# Patient Record
Sex: Female | Born: 1935 | Race: White | Hispanic: No | Smoking: Former smoker
Health system: Southern US, Community
[De-identification: ages and names within clinical notes are randomized; demographics above are authoritative.]

## PROBLEM LIST (undated history)

## (undated) DIAGNOSIS — I10 Essential (primary) hypertension: Secondary | ICD-10-CM

## (undated) DIAGNOSIS — Z860100 Personal history of colon polyps, unspecified: Secondary | ICD-10-CM

## (undated) DIAGNOSIS — C50919 Malignant neoplasm of unspecified site of unspecified female breast: Secondary | ICD-10-CM

## (undated) DIAGNOSIS — Z923 Personal history of irradiation: Secondary | ICD-10-CM

## (undated) DIAGNOSIS — I4891 Unspecified atrial fibrillation: Secondary | ICD-10-CM

## (undated) DIAGNOSIS — E7800 Pure hypercholesterolemia, unspecified: Secondary | ICD-10-CM

## (undated) DIAGNOSIS — R079 Chest pain, unspecified: Secondary | ICD-10-CM

## (undated) DIAGNOSIS — C801 Malignant (primary) neoplasm, unspecified: Secondary | ICD-10-CM

## (undated) HISTORY — PX: CATARACT EXTRACTION: SUR2

## (undated) HISTORY — PX: DILATION AND CURETTAGE OF UTERUS: SHX78

## (undated) HISTORY — DX: Personal history of colonic polyps: Z86.010

## (undated) HISTORY — DX: Pure hypercholesterolemia, unspecified: E78.00

## (undated) HISTORY — DX: Personal history of colon polyps, unspecified: Z86.0100

## (undated) HISTORY — DX: Essential (primary) hypertension: I10

---
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
## 1998-03-16 HISTORY — PX: LAMINECTOMY: SHX219

## 2001-03-16 HISTORY — PX: OTHER SURGICAL HISTORY: SHX169

## 2004-02-18 ENCOUNTER — Ambulatory Visit: Payer: Self-pay | Admitting: Internal Medicine

## 2004-05-28 ENCOUNTER — Ambulatory Visit: Payer: Self-pay | Admitting: Unknown Physician Specialty

## 2004-09-05 ENCOUNTER — Ambulatory Visit: Payer: Self-pay | Admitting: Internal Medicine

## 2004-09-11 ENCOUNTER — Ambulatory Visit: Payer: Self-pay | Admitting: Internal Medicine

## 2004-10-27 ENCOUNTER — Ambulatory Visit: Payer: Self-pay | Admitting: Internal Medicine

## 2005-02-18 ENCOUNTER — Ambulatory Visit: Payer: Self-pay | Admitting: Internal Medicine

## 2006-02-23 ENCOUNTER — Ambulatory Visit: Payer: Self-pay | Admitting: Internal Medicine

## 2006-05-07 ENCOUNTER — Other Ambulatory Visit: Payer: Self-pay

## 2006-05-07 ENCOUNTER — Inpatient Hospital Stay: Payer: Self-pay | Admitting: Internal Medicine

## 2007-03-31 ENCOUNTER — Ambulatory Visit: Payer: Self-pay | Admitting: Internal Medicine

## 2008-04-02 ENCOUNTER — Ambulatory Visit: Payer: Self-pay | Admitting: Internal Medicine

## 2009-04-03 ENCOUNTER — Ambulatory Visit: Payer: Self-pay | Admitting: Internal Medicine

## 2010-04-07 ENCOUNTER — Ambulatory Visit: Payer: Self-pay | Admitting: Internal Medicine

## 2010-05-01 ENCOUNTER — Ambulatory Visit: Payer: Self-pay | Admitting: Unknown Physician Specialty

## 2011-02-11 ENCOUNTER — Ambulatory Visit: Payer: Self-pay | Admitting: Ophthalmology

## 2011-02-11 DIAGNOSIS — I10 Essential (primary) hypertension: Secondary | ICD-10-CM

## 2011-02-23 ENCOUNTER — Ambulatory Visit: Payer: Self-pay | Admitting: Ophthalmology

## 2011-04-13 ENCOUNTER — Ambulatory Visit: Payer: Self-pay | Admitting: Internal Medicine

## 2011-12-21 ENCOUNTER — Ambulatory Visit: Payer: Self-pay | Admitting: Ophthalmology

## 2011-12-21 DIAGNOSIS — I10 Essential (primary) hypertension: Secondary | ICD-10-CM

## 2011-12-21 LAB — POTASSIUM: Potassium: 3.5 mmol/L (ref 3.5–5.1)

## 2011-12-22 ENCOUNTER — Telehealth: Payer: Self-pay | Admitting: Internal Medicine

## 2011-12-22 NOTE — Telephone Encounter (Signed)
Pt is having sharpe at times and ongoing dull pains in lower right abdomen. It is not her appendix she has already had that removed. She is just concerned about this and wanted to come in sooner than January.   Cell Phone [phone]

## 2011-12-22 NOTE — Telephone Encounter (Signed)
Ok to schedule on 12/28/11 at 9:30.

## 2011-12-22 NOTE — Telephone Encounter (Signed)
Appointment 10/14 pt aware

## 2011-12-22 NOTE — Telephone Encounter (Signed)
Confirm no acute problems currently.  If no acute problems then I can work her in 11:30 on Thursday.  Explain to her she may have to wait.  Once confirm no acute problems, can forward to Robin to schedule.  Thanks.

## 2011-12-22 NOTE — Telephone Encounter (Signed)
Patient advised via telephone, she is having no acute issues.  Offered her appt with Dr. Lorin Picket on Thursday and she declined, she stated that she can wait until next week.

## 2011-12-25 ENCOUNTER — Ambulatory Visit: Payer: Self-pay | Admitting: Ophthalmology

## 2011-12-28 ENCOUNTER — Ambulatory Visit (INDEPENDENT_AMBULATORY_CARE_PROVIDER_SITE_OTHER): Payer: Medicare Other | Admitting: Internal Medicine

## 2011-12-28 ENCOUNTER — Encounter: Payer: Self-pay | Admitting: Internal Medicine

## 2011-12-28 VITALS — BP 132/70 | HR 73 | Temp 98.1°F | Ht 60.5 in | Wt 149.0 lb

## 2011-12-28 DIAGNOSIS — E782 Mixed hyperlipidemia: Secondary | ICD-10-CM | POA: Insufficient documentation

## 2011-12-28 DIAGNOSIS — R102 Pelvic and perineal pain unspecified side: Secondary | ICD-10-CM

## 2011-12-28 DIAGNOSIS — E785 Hyperlipidemia, unspecified: Secondary | ICD-10-CM | POA: Insufficient documentation

## 2011-12-28 DIAGNOSIS — I10 Essential (primary) hypertension: Secondary | ICD-10-CM

## 2011-12-28 DIAGNOSIS — E7800 Pure hypercholesterolemia, unspecified: Secondary | ICD-10-CM

## 2011-12-28 DIAGNOSIS — R109 Unspecified abdominal pain: Secondary | ICD-10-CM

## 2011-12-28 DIAGNOSIS — N949 Unspecified condition associated with female genital organs and menstrual cycle: Secondary | ICD-10-CM

## 2011-12-28 LAB — CBC WITH DIFFERENTIAL/PLATELET
Eosinophils Absolute: 0.1 10*3/uL (ref 0.0–0.7)
Eosinophils Relative: 1.6 % (ref 0.0–5.0)
Lymphocytes Relative: 21.3 % (ref 12.0–46.0)
MCV: 95.2 fl (ref 78.0–100.0)
Monocytes Absolute: 0.3 10*3/uL (ref 0.1–1.0)
Neutrophils Relative %: 69.3 % (ref 43.0–77.0)
Platelets: 226 10*3/uL (ref 150.0–400.0)
WBC: 4.4 10*3/uL — ABNORMAL LOW (ref 4.5–10.5)

## 2011-12-28 LAB — LIPID PANEL
HDL: 76.1 mg/dL (ref >39.00)
LDL Cholesterol: 88 mg/dL (ref 0–99)
Total CHOL/HDL Ratio: 2
VLDL: 18.6 mg/dL (ref 0.0–40.0)

## 2011-12-28 LAB — URINALYSIS, ROUTINE W REFLEX MICROSCOPIC
Bilirubin Urine: NEGATIVE
Ketones, ur: NEGATIVE
Leukocytes, UA: NEGATIVE
Nitrite: NEGATIVE

## 2011-12-28 LAB — HEPATIC FUNCTION PANEL
Alkaline Phosphatase: 74 U/L (ref 39–117)
Bilirubin, Direct: 0.1 mg/dL (ref 0.0–0.3)

## 2011-12-28 NOTE — Patient Instructions (Addendum)
It was nice seeing you today.  I am sorry you have been having some problems lately.  I am going to check a few things in the blood and obtain a urine sample today.  I am also going to schedule a pelvic ultrasound.   We will notify you of the results of the labs and ultrasound once they become available.

## 2011-12-28 NOTE — Progress Notes (Signed)
  Subjective:    Patient ID: Erin [name], female    DOB: 1930-07-06, 78 y.o.   MRN: [id number]  HPI 78 year old female with past history of hypertension and hypercholesterolemia who comes in today as a work in with concerns regarding persistent/intermittant right lower quadrant pain.  Pain started in 6/13.  Has worsened over the last two months.  Has become more frequent and more intense.  Described as a dull ache and then will change to a sharp pain.  Flares intermittently.  No known triggers.  No change in bowels.  No urinary change.  No problems walking.  Pain does not radiate.    Past Medical History  Diagnosis Date  . Hypertension   . Hypercholesterolemia   . Melanoma     Review of Systems Patient denies any headache, lightheadedness or dizziness.  No chest pain, tightness or palpatations. No increased shortness of breath, cough or congestion.  No acid reflux, dysphagia or odynophagia. No nausea or vomiting.  Pain localized to the RLQ.  No radiation.  No bowel change, such as diarrhea, constipation, BRBPR or melana.  No urine change.   No history of kidney stones.  No back pain.  No known injury, trauma or strain.       Objective:   Physical Exam Filed Vitals:   12/28/11 0915  BP: 132/70  Pulse: 73  Temp: 98.1 F (45.60 C)   78 year old female in no acute distress.  NECK:  Supple.  Nontender. HEART:  Appears to be regular. LUNGS:  No crackles or wheezing audible.  Respirations even and unlabored.  RADIAL PULSE:  Equal bilaterally. ABDOMEN:  Soft.  Some tenderness to palpation over the RLQ.  No rebound or guarding.  Bowel sounds present and normal.  No audible abdominal bruit.  GU:  Normal external genitalia.  Vaginal vault without lesions.  Could not appreciate any adnexal masses (some minimal tenderness- right -  on bimanual exam).    RECTAL:  Heme negative.   EXTREMITIES:  No increased edema.            Assessment & Plan:  RIGHT LOWER QUADRANT PAIN.  Persistent intermittent  pain over the last two months.  No bowel changes associated with the pain.  Pain has increased in frequency and intensity lately.  Exam as outlined.  Check cbc, metabolic panel and urinalysis.  Will also schedule a pelvic ultrasound.  Follow closely.  If above unrevealing of an etiology, will consider CT scan.   She reports she had a recent colonoscopy.  If any change or worsening problems, she is to be reevaluated.   OPTHALMOLOGY.  S/P cataract surgery 12/25/11.  Doing well.  Continue to follow up with Opthalmology.   HEALTH MAINTENANCE.  Will obtain outside records to review - to keep her on track with her physicals, etc.

## 2011-12-28 NOTE — Assessment & Plan Note (Signed)
Blood pressure has been under good control.  Check met b today.  Continue same meds.

## 2011-12-28 NOTE — Assessment & Plan Note (Addendum)
Low cholesterol diet.  Continue Lipitor.  Check lipid panel and liver function today.

## 2011-12-29 ENCOUNTER — Encounter: Payer: Self-pay | Admitting: *Deleted

## 2011-12-29 LAB — BASIC METABOLIC PANEL WITH GFR
Chloride: 103 mEq/L (ref 96–112)
GFR, Est African American: 76 mL/min
GFR, Est Non African American: 66 mL/min
Glucose, Bld: 94 mg/dL (ref 70–99)
Potassium: 4.4 mEq/L (ref 3.5–5.3)
Sodium: 141 mEq/L (ref 135–145)

## 2012-01-04 ENCOUNTER — Encounter: Payer: Self-pay | Admitting: Internal Medicine

## 2012-01-11 ENCOUNTER — Ambulatory Visit: Payer: Self-pay | Admitting: Internal Medicine

## 2012-01-13 ENCOUNTER — Telehealth: Payer: Self-pay | Admitting: Internal Medicine

## 2012-01-13 NOTE — Telephone Encounter (Signed)
Please notify pt that her pelvic ultrasound is negative.  Need to know if symptoms have resolved - how is she doing?

## 2012-01-15 NOTE — Telephone Encounter (Signed)
Patient states that she is feeling better and that she did not want to do anything else about it at this time. Patient also stated that she will let you know if she has any more problems.

## 2012-01-21 ENCOUNTER — Encounter: Payer: Self-pay | Admitting: Internal Medicine

## 2012-03-13 ENCOUNTER — Telehealth: Payer: Self-pay | Admitting: Internal Medicine

## 2012-03-13 NOTE — Telephone Encounter (Signed)
Pt needs to be scheduled for a physical.  Her last was 02/13/11.  (first available physical spot).  Thanks.

## 2012-03-14 ENCOUNTER — Telehealth: Payer: Self-pay | Admitting: Internal Medicine

## 2012-03-14 DIAGNOSIS — Z139 Encounter for screening, unspecified: Secondary | ICD-10-CM

## 2012-03-14 NOTE — Telephone Encounter (Signed)
Notify pt I placed order for mammo.  She should be receiving a call about scheduling.

## 2012-03-14 NOTE — Telephone Encounter (Signed)
Order placed for mammo.

## 2012-03-14 NOTE — Telephone Encounter (Signed)
scheduled

## 2012-03-14 NOTE — Telephone Encounter (Signed)
Pt was wanting to know if an order for a mammo could be put in for her to go in January to [place]

## 2012-03-15 NOTE — Telephone Encounter (Signed)
Patient notified

## 2012-04-11 ENCOUNTER — Ambulatory Visit: Payer: Self-pay | Admitting: Internal Medicine

## 2012-04-13 ENCOUNTER — Ambulatory Visit: Payer: Self-pay | Admitting: Internal Medicine

## 2012-04-18 ENCOUNTER — Encounter: Payer: Medicare Other | Admitting: Internal Medicine

## 2012-04-30 ENCOUNTER — Other Ambulatory Visit: Payer: Self-pay

## 2012-05-02 ENCOUNTER — Encounter: Payer: Self-pay | Admitting: Internal Medicine

## 2012-05-30 ENCOUNTER — Ambulatory Visit (INDEPENDENT_AMBULATORY_CARE_PROVIDER_SITE_OTHER): Payer: Medicare Other | Admitting: Internal Medicine

## 2012-05-30 ENCOUNTER — Encounter: Payer: Medicare Other | Admitting: Internal Medicine

## 2012-05-30 ENCOUNTER — Encounter: Payer: Self-pay | Admitting: Internal Medicine

## 2012-05-30 VITALS — BP 140/70 | HR 83 | Temp 97.3°F | Ht 60.5 in | Wt 154.8 lb

## 2012-05-30 DIAGNOSIS — E7800 Pure hypercholesterolemia, unspecified: Secondary | ICD-10-CM

## 2012-05-30 DIAGNOSIS — I10 Essential (primary) hypertension: Secondary | ICD-10-CM

## 2012-05-30 DIAGNOSIS — Z860100 Personal history of colon polyps, unspecified: Secondary | ICD-10-CM

## 2012-05-30 DIAGNOSIS — R739 Hyperglycemia, unspecified: Secondary | ICD-10-CM

## 2012-05-30 DIAGNOSIS — Z23 Encounter for immunization: Secondary | ICD-10-CM

## 2012-05-30 DIAGNOSIS — R7309 Other abnormal glucose: Secondary | ICD-10-CM

## 2012-05-30 MED ORDER — FUROSEMIDE 20 MG PO TABS: 20.0000 mg | ORAL_TABLET | Freq: Every day | ORAL | Status: DC

## 2012-05-31 ENCOUNTER — Encounter: Payer: Self-pay | Admitting: Internal Medicine

## 2012-05-31 DIAGNOSIS — R739 Hyperglycemia, unspecified: Secondary | ICD-10-CM | POA: Insufficient documentation

## 2012-05-31 DIAGNOSIS — Z86010 Personal history of colonic polyps: Secondary | ICD-10-CM | POA: Insufficient documentation

## 2012-05-31 MED ORDER — TETANUS-DIPHTH-ACELL PERTUSSIS 5-2.5-18.5 LF-MCG/0.5 IM SUSP
0.5000 mL | Freq: Once | INTRAMUSCULAR | Status: AC
  Administered 2012-05-31: 0.5 mL via INTRAMUSCULAR

## 2012-05-31 NOTE — Assessment & Plan Note (Signed)
Low carb diet and exercise.  Follow.  

## 2012-05-31 NOTE — Assessment & Plan Note (Signed)
Low cholesterol diet.  Continue Lipitor.  Check lipid panel and liver function.   

## 2012-05-31 NOTE — Addendum Note (Signed)
Addended by: Marlene Lard on: 05/31/2012 08:29 AM   Modules accepted: Orders

## 2012-05-31 NOTE — Assessment & Plan Note (Signed)
Blood pressure has been under good control.  Check met b.  Continue same meds.

## 2012-05-31 NOTE — Assessment & Plan Note (Signed)
Last colonoscopy 05/01/10.  Six polys removed.  GI felt no further colonoscopy warranted.   

## 2012-05-31 NOTE — Progress Notes (Signed)
Subjective:    Patient ID: Erin [name], female    DOB: 1930/12/21, 80 y.o.   MRN: [id number]  HPI 79 year old female with past history of hypertension and hypercholesterolemia who comes in today to follow up on these issues as well as for a complete physical exam.  Previously evaluated for RLQ pain.  See last note for details.  She states she still may notice some minimal discomfort at times, but nothing on a regular basis.  No known triggers.  No bowel change.  Eating and drinking well.  No nausea or vomiting.  Desires no further w/up.  Stays active.  Exercises.  No chest pain or tightness with increased activity or exertion.  Breathing stable.  Increased stress with her husband's health issues.  She feels she is handling things relatively well.      Past Medical History  Diagnosis Date  . Hypertension   . Hypercholesterolemia   . History of colon polyps     Current Outpatient Prescriptions on File Prior to Visit  Medication Sig Dispense Refill  . aspirin 81 MG tablet Take 81 mg by mouth daily.      Marland Kitchen atorvastatin (LIPITOR) 20 MG tablet Take 20 mg by mouth daily.      . benazepril (LOTENSIN) 40 MG tablet Take 40 mg by mouth 2 (two) times daily.      . Calcium Carbonate-Vitamin D (CALCIUM 600/VITAMIN D) 600-400 MG-UNIT per tablet Take 1 tablet by mouth daily.      . felodipine (PLENDIL) 2.5 MG 24 hr tablet Take 2.5 mg by mouth daily.      . Multiple Vitamin (MULTIVITAMIN) tablet Take 1 tablet by mouth daily.      . Omega-3 Fatty Acids (FISH OIL) 1000 MG CAPS Take by mouth daily.       No current facility-administered medications on file prior to visit.    Review of Systems Patient denies any headache, lightheadedness or dizziness.  No significant sinus or allergy symptoms.  No chest pain, tightness or palpitations. No increased shortness of breath, cough or congestion.  No acid reflux, dysphagia or odynophagia. No nausea or vomiting.  No bowel change, such as diarrhea, constipation, BRBPR  or melana.  No urine change.   No history of kidney stones.  No back pain.  Intermittent right lower quadrant pain.  Minimal.  No known triggers.  Desires no further w/up.      Objective:   Physical Exam  Filed Vitals:   05/30/12 1608  BP: 140/70  Pulse: 83  Temp: 97.3 F (36.3 C)   Blood pressure recheck:  132/80, pulse 43  79 year old female in no acute distress.   HEENT:  Nares- clear.  Oropharynx - without lesions. NECK:  Supple.  Nontender.  No audible bruit.  HEART:  Appears to be regular. LUNGS:  No crackles or wheezing audible.  Respirations even and unlabored.  RADIAL PULSE:  Equal bilaterally.    BREASTS:  No nipple discharge or nipple retraction present.  Could not appreciate any distinct nodules or axillary adenopathy.  ABDOMEN:  Soft.  No significant tenderness to palpation.   Bowel sounds present and normal.  No audible abdominal bruit.  GU: she declined.    EXTREMITIES:  No increased edema present.  DP pulses palpable and equal bilaterally.        Assessment & Plan:  CARDIOVASCULAR.  Asymptomatic.    RIGHT LOWER QUADRANT PAIN.  Previous pelvic ultrasound negative.  Minimal.  Intermittent.  Desires no  further w/up.  Follow.    OPTHALMOLOGY.  S/P cataract surgery 12/25/11.  Doing well.  Continue to follow up with Opthalmology.   HEALTH MAINTENANCE.  Physical today.  Colonoscopy 05/01/10 - 6 polyps.  Per GI - no further colonoscopy warranted.  Schedule follow up mammogram.  Bone density 03/19/10 - normal.  Tdap given today.

## 2012-06-13 ENCOUNTER — Encounter: Payer: Self-pay | Admitting: Internal Medicine

## 2012-06-16 ENCOUNTER — Other Ambulatory Visit (INDEPENDENT_AMBULATORY_CARE_PROVIDER_SITE_OTHER): Payer: Medicare Other

## 2012-06-16 DIAGNOSIS — I10 Essential (primary) hypertension: Secondary | ICD-10-CM

## 2012-06-16 DIAGNOSIS — R7309 Other abnormal glucose: Secondary | ICD-10-CM

## 2012-06-16 DIAGNOSIS — R739 Hyperglycemia, unspecified: Secondary | ICD-10-CM

## 2012-06-16 DIAGNOSIS — E7800 Pure hypercholesterolemia, unspecified: Secondary | ICD-10-CM

## 2012-06-16 LAB — LIPID PANEL
Cholesterol: 183 mg/dL (ref 0–200)
HDL: 89.1 mg/dL (ref >39.00)
LDL Cholesterol: 80 mg/dL (ref 0–99)
Total CHOL/HDL Ratio: 2
Triglycerides: 72 mg/dL (ref 0.0–149.0)
VLDL: 14.4 mg/dL (ref 0.0–40.0)

## 2012-06-16 LAB — HEPATIC FUNCTION PANEL
Albumin: 3.9 g/dL (ref 3.5–5.2)
Alkaline Phosphatase: 51 U/L (ref 39–117)
Total Protein: 6.6 g/dL (ref 6.0–8.3)

## 2012-06-16 LAB — BASIC METABOLIC PANEL
BUN: 19 mg/dL (ref 6–23)
Calcium: 9.4 mg/dL (ref 8.4–10.5)
Creatinine, Ser: 1 mg/dL (ref 0.4–1.2)
GFR: 60.12 mL/min (ref >60.00)

## 2012-06-16 LAB — HEMOGLOBIN A1C: Hgb A1c MFr Bld: 5.6 % (ref 4.6–6.5)

## 2012-06-17 ENCOUNTER — Telehealth: Payer: Self-pay | Admitting: Internal Medicine

## 2012-06-17 DIAGNOSIS — E871 Hypo-osmolality and hyponatremia: Secondary | ICD-10-CM

## 2012-06-17 NOTE — Telephone Encounter (Signed)
Called pt to schedule labs in 2 week pt wanted to know what the results for the labs she had done 4/3 Please advise pt  Pt stated that is has problems getting into my chart.  Can you also schedule her 2 week non fasting lab at that time

## 2012-06-17 NOTE — Telephone Encounter (Signed)
Pt was notified of lab results vai my chart and notified she needed a follow up lab within two weeks.  Please schedule her for a non fasting lab in 2 weeks.  Please call her with an appt date and time.  Thanks.

## 2012-06-20 NOTE — Telephone Encounter (Signed)
Patient is aware of results and has scheduled lab appointment.

## 2012-06-24 ENCOUNTER — Other Ambulatory Visit (INDEPENDENT_AMBULATORY_CARE_PROVIDER_SITE_OTHER): Payer: Medicare Other

## 2012-06-24 DIAGNOSIS — E871 Hypo-osmolality and hyponatremia: Secondary | ICD-10-CM

## 2012-06-25 ENCOUNTER — Other Ambulatory Visit: Payer: Self-pay | Admitting: Internal Medicine

## 2012-06-25 ENCOUNTER — Telehealth: Payer: Self-pay | Admitting: Internal Medicine

## 2012-06-25 DIAGNOSIS — E871 Hypo-osmolality and hyponatremia: Secondary | ICD-10-CM

## 2012-06-25 NOTE — Progress Notes (Signed)
Order placed for follow up sodium

## 2012-06-25 NOTE — Telephone Encounter (Signed)
Notified pt of labs via mychart

## 2012-06-25 NOTE — Telephone Encounter (Signed)
Pt notified of lab results via my chart.  Needs to be scheduled for a non fasting lab appt in 3-4 weeks.  Please call her with an appt date and time.  Thanks.

## 2012-06-28 NOTE — Telephone Encounter (Signed)
May 7 pt aware

## 2012-07-20 ENCOUNTER — Other Ambulatory Visit (INDEPENDENT_AMBULATORY_CARE_PROVIDER_SITE_OTHER): Payer: Medicare Other

## 2012-07-20 DIAGNOSIS — E871 Hypo-osmolality and hyponatremia: Secondary | ICD-10-CM

## 2012-07-22 ENCOUNTER — Encounter: Payer: Self-pay | Admitting: Internal Medicine

## 2012-09-12 ENCOUNTER — Other Ambulatory Visit: Payer: Self-pay | Admitting: *Deleted

## 2012-09-12 MED ORDER — BENAZEPRIL HCL 40 MG PO TABS: 40.0000 mg | ORAL_TABLET | Freq: Two times a day (BID) | ORAL | Status: DC

## 2012-09-12 MED ORDER — FUROSEMIDE 20 MG PO TABS: 20.0000 mg | ORAL_TABLET | Freq: Every day | ORAL | Status: DC

## 2012-09-12 MED ORDER — FELODIPINE ER 2.5 MG PO TB24: 2.5000 mg | ORAL_TABLET | Freq: Every day | ORAL | Status: DC

## 2012-11-30 ENCOUNTER — Encounter: Payer: Self-pay | Admitting: Internal Medicine

## 2012-11-30 ENCOUNTER — Ambulatory Visit (INDEPENDENT_AMBULATORY_CARE_PROVIDER_SITE_OTHER): Payer: Medicare Other | Admitting: Internal Medicine

## 2012-11-30 VITALS — BP 120/70 | HR 86 | Temp 98.1°F | Ht 60.5 in | Wt 156.5 lb

## 2012-11-30 DIAGNOSIS — R7309 Other abnormal glucose: Secondary | ICD-10-CM

## 2012-11-30 DIAGNOSIS — I10 Essential (primary) hypertension: Secondary | ICD-10-CM

## 2012-11-30 DIAGNOSIS — Z23 Encounter for immunization: Secondary | ICD-10-CM

## 2012-11-30 DIAGNOSIS — R739 Hyperglycemia, unspecified: Secondary | ICD-10-CM

## 2012-11-30 DIAGNOSIS — E7800 Pure hypercholesterolemia, unspecified: Secondary | ICD-10-CM

## 2012-11-30 DIAGNOSIS — Z86010 Personal history of colonic polyps: Secondary | ICD-10-CM

## 2012-11-30 LAB — BASIC METABOLIC PANEL
BUN: 19 mg/dL (ref 6–23)
Calcium: 9.9 mg/dL (ref 8.4–10.5)
Creatinine, Ser: 0.9 mg/dL (ref 0.4–1.2)
GFR: 61.55 mL/min (ref >60.00)
Glucose, Bld: 95 mg/dL (ref 70–99)

## 2012-11-30 LAB — HEPATIC FUNCTION PANEL
Albumin: 4.2 g/dL (ref 3.5–5.2)
Total Protein: 7 g/dL (ref 6.0–8.3)

## 2012-11-30 LAB — LIPID PANEL: VLDL: 9.6 mg/dL (ref 0.0–40.0)

## 2012-11-30 LAB — HEMOGLOBIN A1C: Hgb A1c MFr Bld: 5.8 % (ref 4.6–6.5)

## 2012-12-03 ENCOUNTER — Encounter: Payer: Self-pay | Admitting: Internal Medicine

## 2012-12-03 NOTE — Assessment & Plan Note (Signed)
Low carb diet and exercise.  Follow.  

## 2012-12-03 NOTE — Progress Notes (Signed)
Subjective:    Patient ID: Erin [name], female    DOB: 11/23/1930, 79 y.o.   MRN: [id number]  HPI 79 year old female with past history of hypertension and hypercholesterolemia who comes in today for a scheduled follow up.   Previously evaluated for RLQ pain.  See last note for details.  She states she still may notice some minimal discomfort at times, but nothing on a regular basis.  No known triggers.  No bowel change.  Eating and drinking well.  No nausea or vomiting.  Desires no further w/up.  Stays active.  Exercises.  No chest pain or tightness with increased activity or exertion.  Breathing stable.  Increased stress with her husband's health issues.  She feels she is handling things relatively well.      Past Medical History  Diagnosis Date  . Hypertension   . Hypercholesterolemia   . History of colon polyps     Current Outpatient Prescriptions on File Prior to Visit  Medication Sig Dispense Refill  . aspirin 81 MG tablet Take 81 mg by mouth daily.      Marland Kitchen atorvastatin (LIPITOR) 20 MG tablet Take 20 mg by mouth daily.      . benazepril (LOTENSIN) 40 MG tablet Take 1 tablet (40 mg total) by mouth 2 (two) times daily.  60 tablet  5  . Calcium Carbonate-Vitamin D (CALCIUM 600/VITAMIN D) 600-400 MG-UNIT per tablet Take 1 tablet by mouth daily.      . felodipine (PLENDIL) 2.5 MG 24 hr tablet Take 1 tablet (2.5 mg total) by mouth daily.  30 tablet  5  . furosemide (LASIX) 20 MG tablet Take 1 tablet (20 mg total) by mouth daily.  30 tablet  5  . Multiple Vitamin (MULTIVITAMIN) tablet Take 1 tablet by mouth daily.      . Omega-3 Fatty Acids (FISH OIL) 1000 MG CAPS Take by mouth daily.       No current facility-administered medications on file prior to visit.    Review of Systems Patient denies any headache, lightheadedness or dizziness.  No significant sinus or allergy symptoms.  No chest pain, tightness or palpitations. No increased shortness of breath, cough or congestion.  No acid  reflux, dysphagia or odynophagia. No nausea or vomiting.  No bowel change, such as diarrhea, constipation, BRBPR or melana.  No urine change.   No history of kidney stones.  No back pain.      Objective:   Physical Exam  Filed Vitals:   11/30/12 0939  BP: 120/70  Pulse: 86  Temp: 98.1 F (36.7 C)   Blood pressure recheck:  56/62  79 year old female in no acute distress.   HEENT:  Nares- clear.  Oropharynx - without lesions. NECK:  Supple.  Nontender.  No audible bruit.  HEART:  Appears to be regular. LUNGS:  No crackles or wheezing audible.  Respirations even and unlabored.  RADIAL PULSE:  Equal bilaterally.  ABDOMEN:  Soft.  No significant tenderness to palpation.   Bowel sounds present and normal.  No audible abdominal bruit.   EXTREMITIES:  No increased edema present.  DP pulses palpable and equal bilaterally.        Assessment & Plan:  CARDIOVASCULAR.  Asymptomatic.    OPTHALMOLOGY.  S/P cataract surgery 12/25/11.  Doing well.  Continue to follow up with Opthalmology.   HEALTH MAINTENANCE.  Physical 05/30/12.  Colonoscopy 05/01/10 - 6 polyps.  Per GI - no further colonoscopy warranted.  Mammogram 04/13/12 -  Birads II.   Bone density 03/19/10 - normal.

## 2012-12-03 NOTE — Assessment & Plan Note (Signed)
Last colonoscopy 05/01/10.  Six polys removed.  GI felt no further colonoscopy warranted.   

## 2012-12-03 NOTE — Assessment & Plan Note (Signed)
Low cholesterol diet.  Continue Lipitor.  Check lipid panel and liver function.   

## 2012-12-03 NOTE — Assessment & Plan Note (Signed)
Blood pressure has been under good control.  Follow met b.  Continue same meds.  

## 2012-12-05 ENCOUNTER — Telehealth: Payer: Self-pay | Admitting: Internal Medicine

## 2012-12-05 NOTE — Telephone Encounter (Signed)
Pt states she received a msg stating her mychart msg had not been read.  Pt wanted to inform that the mychart msg was read and she did get the results of the lab tests so she is not sure why it is saying that the msg was not checked.

## 2012-12-05 NOTE — Telephone Encounter (Signed)
Pt notified to contact mychart support

## 2013-03-13 ENCOUNTER — Ambulatory Visit (INDEPENDENT_AMBULATORY_CARE_PROVIDER_SITE_OTHER): Payer: Medicare Other | Admitting: Internal Medicine

## 2013-03-13 ENCOUNTER — Encounter: Payer: Self-pay | Admitting: Internal Medicine

## 2013-03-13 ENCOUNTER — Telehealth: Payer: Self-pay | Admitting: Internal Medicine

## 2013-03-13 VITALS — BP 122/70 | HR 65 | Temp 97.8°F | Ht 60.5 in | Wt 155.8 lb

## 2013-03-13 DIAGNOSIS — I10 Essential (primary) hypertension: Secondary | ICD-10-CM

## 2013-03-13 DIAGNOSIS — M7989 Other specified soft tissue disorders: Secondary | ICD-10-CM

## 2013-03-13 NOTE — Progress Notes (Signed)
Pre-visit discussion using our clinic review tool. No additional management support is needed unless otherwise documented below in the visit note.  

## 2013-03-13 NOTE — Telephone Encounter (Signed)
Please advise-last seen in office on 11/30/12

## 2013-03-13 NOTE — Telephone Encounter (Signed)
Pt notified & will come today at 11:45

## 2013-03-13 NOTE — Telephone Encounter (Signed)
I can work her in at 11:45 today.  May have to wait.  Being worked in.

## 2013-03-13 NOTE — Telephone Encounter (Signed)
Patient called in and states she is having pain in her leg and swelling she states that it is tender to the touch, her son who is a podiatrist said that she might need an ultrasound to rule out blood clot. Please advise.

## 2013-03-16 ENCOUNTER — Encounter: Payer: Self-pay | Admitting: Internal Medicine

## 2013-03-16 DIAGNOSIS — M25562 Pain in left knee: Secondary | ICD-10-CM | POA: Insufficient documentation

## 2013-03-16 NOTE — Assessment & Plan Note (Signed)
Blood pressure has been under good control.  Follow met b.  Continue same meds.

## 2013-03-16 NOTE — Progress Notes (Signed)
  Subjective:    Patient ID: Erin [name], female    DOB: 1930/12/03, 80 y.o.   MRN: [id number]  Leg Pain   80 year old female with past history of hypertension and hypercholesterolemia who comes in today as a work in with concerns regarding increased leg pain and swelling.  States went to [facility] several weeks ago.  Was climbing in and out of a van.  Started (after returning home) with some leg discomfort.  Pain localized to left popliteal region.  Pain has improved some.  Noticed some swelling last week.  No increased erythema or warmth.  Concerned about a possible blood clot.   Stays active.  Exercises.  No chest pain or tightness with increased activity or exertion.  Breathing stable.      Past Medical History  Diagnosis Date  . Hypertension   . Hypercholesterolemia   . History of colon polyps     Current Outpatient Prescriptions on File Prior to Visit  Medication Sig Dispense Refill  . aspirin 81 MG tablet Take 81 mg by mouth daily.      . benazepril (LOTENSIN) 40 MG tablet Take 1 tablet (40 mg total) by mouth 2 (two) times daily.  60 tablet  5  . Calcium Carbonate-Vitamin D (CALCIUM 600/VITAMIN D) 600-400 MG-UNIT per tablet Take 1 tablet by mouth daily.      . felodipine (PLENDIL) 2.5 MG 24 hr tablet Take 1 tablet (2.5 mg total) by mouth daily.  30 tablet  5  . furosemide (LASIX) 20 MG tablet Take 1 tablet (20 mg total) by mouth daily.  30 tablet  5  . Multiple Vitamin (MULTIVITAMIN) tablet Take 1 tablet by mouth daily.      . Omega-3 Fatty Acids (FISH OIL) 1000 MG CAPS Take by mouth daily.       No current facility-administered medications on file prior to visit.    Review of Systems Patient denies any headache, lightheadedness or dizziness.  No chest pain, tightness or palpitations. No increased shortness of breath, cough or congestion.  Left leg pain and swelling as outlined.  No increased erythema or warmth.       Objective:   Physical Exam  Filed Vitals:   03/13/13  1157  BP: 122/70  Pulse: 65  Temp: 97.8 F (57.25 C)   80 year old female in no acute distress. HEART:  Appears to be regular. LUNGS:  No crackles or wheezing audible.  Respirations even and unlabored.  RADIAL PULSE:  Equal bilaterally. EXTREMITIES:  Minimal left lower extremity swelling.  Left slightly greater than right.  No swelling extending up above the knee.   DP pulses palpable and equal bilaterally.   No increased erythema or warmth.       Assessment & Plan:  CARDIOVASCULAR.  Asymptomatic.    HEALTH MAINTENANCE.  Physical 05/30/12.  Colonoscopy 05/01/10 - 6 polyps.  Per GI - no further colonoscopy warranted. Mammogram 04/13/12 - Birads II.   Bone density 03/19/10 - normal.

## 2013-03-16 NOTE — Assessment & Plan Note (Signed)
Left lower extremity swelling and some discomfort.  Pain has improved.  Will check left lower extremity ultrasound.  Further w/up pending.

## 2013-03-20 ENCOUNTER — Telehealth: Payer: Self-pay | Admitting: Internal Medicine

## 2013-03-20 ENCOUNTER — Encounter: Payer: Self-pay | Admitting: *Deleted

## 2013-03-20 NOTE — Telephone Encounter (Signed)
The patient is wanting to know what is the next step after having her ultrasound done.

## 2013-03-20 NOTE — Telephone Encounter (Signed)
Erin [name] sent back her questionnaire with the question "What next?"  She had come in for leg pain and swelling.  Lower extremity ultrasound was negative for DVT.  She had informed me the pain was better.  Can use support hose for the swelling.  What persistent symptoms is she having now and then can determine what is next best step.

## 2013-03-21 ENCOUNTER — Encounter: Payer: Self-pay | Admitting: *Deleted

## 2013-03-21 NOTE — Telephone Encounter (Signed)
Sent pt a mychart message. 

## 2013-03-23 NOTE — Telephone Encounter (Signed)
Mailed unread message to pt  

## 2013-04-10 ENCOUNTER — Encounter: Payer: Self-pay | Admitting: Internal Medicine

## 2013-04-19 ENCOUNTER — Other Ambulatory Visit: Payer: Self-pay | Admitting: *Deleted

## 2013-04-19 MED ORDER — FUROSEMIDE 20 MG PO TABS: 20.0000 mg | ORAL_TABLET | Freq: Every day | ORAL | Status: DC

## 2013-04-25 ENCOUNTER — Ambulatory Visit: Payer: Self-pay | Admitting: Internal Medicine

## 2013-04-25 LAB — HM MAMMOGRAPHY

## 2013-04-26 ENCOUNTER — Encounter: Payer: Self-pay | Admitting: Internal Medicine

## 2013-05-09 ENCOUNTER — Ambulatory Visit: Payer: Self-pay | Admitting: Internal Medicine

## 2013-05-09 LAB — HM MAMMOGRAPHY

## 2013-05-12 ENCOUNTER — Encounter: Payer: Self-pay | Admitting: Internal Medicine

## 2013-05-30 ENCOUNTER — Telehealth: Payer: Self-pay | Admitting: *Deleted

## 2013-05-30 DIAGNOSIS — R739 Hyperglycemia, unspecified: Secondary | ICD-10-CM

## 2013-05-30 DIAGNOSIS — E7800 Pure hypercholesterolemia, unspecified: Secondary | ICD-10-CM

## 2013-05-30 DIAGNOSIS — I10 Essential (primary) hypertension: Secondary | ICD-10-CM

## 2013-05-30 NOTE — Telephone Encounter (Signed)
Pt is coming in tomorrow for labs, what labs and dX?

## 2013-05-31 ENCOUNTER — Other Ambulatory Visit (INDEPENDENT_AMBULATORY_CARE_PROVIDER_SITE_OTHER): Payer: Commercial Managed Care - HMO

## 2013-05-31 DIAGNOSIS — R739 Hyperglycemia, unspecified: Secondary | ICD-10-CM

## 2013-05-31 DIAGNOSIS — I10 Essential (primary) hypertension: Secondary | ICD-10-CM

## 2013-05-31 DIAGNOSIS — E7800 Pure hypercholesterolemia, unspecified: Secondary | ICD-10-CM

## 2013-05-31 DIAGNOSIS — R7309 Other abnormal glucose: Secondary | ICD-10-CM

## 2013-05-31 LAB — LIPID PANEL
CHOL/HDL RATIO: 3
Cholesterol: 274 mg/dL — ABNORMAL HIGH (ref 0–200)
HDL: 85.9 mg/dL (ref >39.00)
LDL CALC: 173 mg/dL — AB (ref 0–99)
Triglycerides: 75 mg/dL (ref 0.0–149.0)
VLDL: 15 mg/dL (ref 0.0–40.0)

## 2013-05-31 LAB — COMPREHENSIVE METABOLIC PANEL
ALT: 16 U/L (ref 0–35)
AST: 17 U/L (ref 0–37)
Albumin: 3.9 g/dL (ref 3.5–5.2)
Alkaline Phosphatase: 48 U/L (ref 39–117)
BILIRUBIN TOTAL: 0.9 mg/dL (ref 0.3–1.2)
BUN: 25 mg/dL — ABNORMAL HIGH (ref 6–23)
CO2: 31 mEq/L (ref 19–32)
Calcium: 9.6 mg/dL (ref 8.4–10.5)
Chloride: 102 mEq/L (ref 96–112)
Creatinine, Ser: 0.9 mg/dL (ref 0.4–1.2)
GFR: 61.47 mL/min (ref >60.00)
Glucose, Bld: 109 mg/dL — ABNORMAL HIGH (ref 70–99)
Potassium: 4.5 mEq/L (ref 3.5–5.1)
Sodium: 137 mEq/L (ref 135–145)
Total Protein: 6.5 g/dL (ref 6.0–8.3)

## 2013-05-31 LAB — CBC WITH DIFFERENTIAL/PLATELET
Basophils Absolute: 0 10*3/uL (ref 0.0–0.1)
Basophils Relative: 0.6 % (ref 0.0–3.0)
EOS PCT: 3.4 % (ref 0.0–5.0)
Eosinophils Absolute: 0.1 10*3/uL (ref 0.0–0.7)
HCT: 39.5 % (ref 36.0–46.0)
Hemoglobin: 13.4 g/dL (ref 12.0–15.0)
Lymphocytes Relative: 25.8 % (ref 12.0–46.0)
Lymphs Abs: 0.9 10*3/uL (ref 0.7–4.0)
MCHC: 33.9 g/dL (ref 30.0–36.0)
MCV: 97.4 fl (ref 78.0–100.0)
Monocytes Absolute: 0.4 10*3/uL (ref 0.1–1.0)
Monocytes Relative: 10.2 % (ref 3.0–12.0)
NEUTROS PCT: 60 % (ref 43.0–77.0)
Neutro Abs: 2.1 10*3/uL (ref 1.4–7.7)
Platelets: 225 10*3/uL (ref 150.0–400.0)
RBC: 4.06 Mil/uL (ref 3.87–5.11)
RDW: 13.7 % (ref 11.5–14.6)
WBC: 3.5 10*3/uL — AB (ref 4.5–10.5)

## 2013-05-31 LAB — HEMOGLOBIN A1C: Hgb A1c MFr Bld: 5.6 % (ref 4.6–6.5)

## 2013-05-31 LAB — TSH: TSH: 1.24 u[IU]/mL (ref 0.35–5.50)

## 2013-05-31 NOTE — Telephone Encounter (Signed)
Orders placed for labs

## 2013-06-02 ENCOUNTER — Encounter: Payer: Self-pay | Admitting: Internal Medicine

## 2013-06-05 ENCOUNTER — Encounter: Payer: Medicare Other | Admitting: Internal Medicine

## 2013-06-05 ENCOUNTER — Encounter: Payer: Self-pay | Admitting: Internal Medicine

## 2013-06-05 NOTE — Telephone Encounter (Signed)
Mailed unread MyChart message to pt  

## 2013-06-07 ENCOUNTER — Telehealth: Payer: Self-pay | Admitting: Internal Medicine

## 2013-06-07 NOTE — Telephone Encounter (Signed)
I called pt and apologized for the scheduling mix up.  I also answered her questions regarding her medication.  She will have her pharmacy call us with need for refills.  She is going to try niacin for her cholesterol.  Please schedule her a physical with me on 06/28/13 - at 12:45.  Please contact her with the appt date and time.  Thanks.   Dr Nicki Reaper

## 2013-06-07 NOTE — Telephone Encounter (Signed)
Pt came into office for appt on 3/25.  Advised pt appt had been scheduled for 3/23. Pt states she was in office last week for blood work and her appt card says 3/25.  Advised pt her appt was originally scheduled for 3/25 and had not been rescheduled at any point, therefore her card should read 3/25 for appt.  Pt then stated she called the office last week and was told 3/25.  Pt thought that she was to have blood work Wednesday and come back for appt the next Wednesday.  Advised pt I could make a new appt to see Dr. Nicki Reaper.  Pt not satisfied.  Asked Dr. Nicki Reaper if pt could be added on.  Advised pt Dr. Nicki Reaper would check her schedule and we would call the patient to let her know when she could be seen.  Pt stated she had prescriptions that needed to be filled.  Advised pt that I could send Dr. Nicki Reaper a message regarding the refills.  Pt refuses.  Advised pt it was not a problem to try to send the message for her refills.  Pt states she needs to talk to Dr. Nicki Reaper about one of them so a message will not work.  Pt states she is frustrated and does not feel that she got the appointment date incorrect.

## 2013-06-08 NOTE — Telephone Encounter (Signed)
Dr Encarnacion Slates already have a pt @ 12:45.  Do you want me to add her @ 1:15?

## 2013-06-08 NOTE — Telephone Encounter (Signed)
Ok to add at [employer]

## 2013-06-08 NOTE — Telephone Encounter (Signed)
Pt aware of appointment 

## 2013-06-13 ENCOUNTER — Other Ambulatory Visit: Payer: Self-pay | Admitting: *Deleted

## 2013-06-13 MED ORDER — BENAZEPRIL HCL 40 MG PO TABS: 40.0000 mg | ORAL_TABLET | Freq: Two times a day (BID) | ORAL | Status: DC

## 2013-06-13 MED ORDER — FELODIPINE ER 2.5 MG PO TB24: 2.5000 mg | ORAL_TABLET | Freq: Every day | ORAL | Status: DC

## 2013-06-28 ENCOUNTER — Encounter: Payer: Self-pay | Admitting: Internal Medicine

## 2013-06-28 ENCOUNTER — Ambulatory Visit (INDEPENDENT_AMBULATORY_CARE_PROVIDER_SITE_OTHER): Payer: Commercial Managed Care - HMO | Admitting: Internal Medicine

## 2013-06-28 VITALS — BP 110/60 | HR 92 | Temp 98.0°F | Ht 60.5 in | Wt 152.2 lb

## 2013-06-28 DIAGNOSIS — Z86010 Personal history of colonic polyps: Secondary | ICD-10-CM

## 2013-06-28 DIAGNOSIS — M25569 Pain in unspecified knee: Secondary | ICD-10-CM

## 2013-06-28 DIAGNOSIS — E7800 Pure hypercholesterolemia, unspecified: Secondary | ICD-10-CM

## 2013-06-28 DIAGNOSIS — I10 Essential (primary) hypertension: Secondary | ICD-10-CM

## 2013-06-28 DIAGNOSIS — R739 Hyperglycemia, unspecified: Secondary | ICD-10-CM

## 2013-06-28 DIAGNOSIS — M25562 Pain in left knee: Secondary | ICD-10-CM

## 2013-06-28 DIAGNOSIS — R7309 Other abnormal glucose: Secondary | ICD-10-CM

## 2013-06-28 NOTE — Progress Notes (Signed)
Pre visit review using our clinic review tool, if applicable. No additional management support is needed unless otherwise documented below in the visit note. 

## 2013-06-28 NOTE — Progress Notes (Signed)
Subjective:    Patient ID: Erin [name], female    DOB: June 24, 1930, 80 y.o.   MRN: [id number]  HPI 80 year old female with past history of hypertension and hypercholesterolemia who comes in today to follow up on these issues as well as for a complete physical exam.  No bowel change.  Eating and drinking well.  No nausea or vomiting.  Stays active.  Exercises.  No chest pain or tightness with increased activity or exertion.  Breathing stable.  Increased stress with her husband's health issues.  She feels she is handling things relatively well.   She has still noticed some discomfort in her left knee.  She desires no further intervention at this time.     Past Medical History  Diagnosis Date  . Hypertension   . Hypercholesterolemia   . History of colon polyps     Current Outpatient Prescriptions on File Prior to Visit  Medication Sig Dispense Refill  . aspirin 81 MG tablet Take 81 mg by mouth daily.      . benazepril (LOTENSIN) 40 MG tablet Take 1 tablet (40 mg total) by mouth 2 (two) times daily.  60 tablet  5  . Calcium Carbonate-Vitamin D (CALCIUM 600/VITAMIN D) 600-400 MG-UNIT per tablet Take 1 tablet by mouth daily.      . felodipine (PLENDIL) 2.5 MG 24 hr tablet Take 1 tablet (2.5 mg total) by mouth daily.  30 tablet  5  . furosemide (LASIX) 20 MG tablet Take 1 tablet (20 mg total) by mouth daily.  30 tablet  5  . Multiple Vitamin (MULTIVITAMIN) tablet Take 1 tablet by mouth daily.      . Omega-3 Fatty Acids (FISH OIL) 1000 MG CAPS Take by mouth daily.       No current facility-administered medications on file prior to visit.    Review of Systems Patient denies any headache, lightheadedness or dizziness.  No significant sinus or allergy symptoms.  No chest pain, tightness or palpitations. No increased shortness of breath, cough or congestion.  No acid reflux, dysphagia or odynophagia. No nausea or vomiting.  No bowel change, such as diarrhea, constipation, BRBPR or melana.  No urine  change.   No history of kidney stones.  No back pain.  Left knee pain and discomfort as outlined.       Objective:   Physical Exam  Filed Vitals:   06/28/13 1259  BP: 110/60  Pulse: 92  Temp: 98 F (36.7 C)   Blood pressure recheck:  56/76  80 year old female in no acute distress.   HEENT:  Nares- clear.  Oropharynx - without lesions. NECK:  Supple.  Nontender.  No audible bruit.  HEART:  Appears to be regular. LUNGS:  No crackles or wheezing audible.  Respirations even and unlabored.  RADIAL PULSE:  Equal bilaterally.    BREASTS:  No nipple discharge or nipple retraction present.  Could not appreciate any distinct nodules or axillary adenopathy.  ABDOMEN:  Soft, nontender.  Bowel sounds present and normal.  No audible abdominal bruit.  GU:  Not performed.     EXTREMITIES:  No increased edema present.  DP pulses palpable and equal bilaterally.      MSK:  No increased erythema left knee.  Minimal fullness - popliteal region.  No calf tenderness.    Assessment & Plan:  CARDIOVASCULAR.  Asymptomatic.    OPTHALMOLOGY.  S/P cataract surgery 12/25/11.  Doing well.  Continue to follow up with Opthalmology.   HEALTH  MAINTENANCE.  Physical today.  Colonoscopy 05/01/10 - 6 polyps.  Per GI - no further colonoscopy warranted.  Mammogram 04/13/12 - Birads II.   Just had mammogram 04/25/13 recommended f/u views.  F/u views 05/09/13 revealed a probable benign area.  Recommend a f/u left breast mammo in 6 months.  Bone density 03/19/10 - normal.

## 2013-07-02 ENCOUNTER — Encounter: Payer: Self-pay | Admitting: Internal Medicine

## 2013-07-02 NOTE — Assessment & Plan Note (Signed)
Blood pressure has been under good control.  Follow met b.  Continue same meds.

## 2013-07-02 NOTE — Assessment & Plan Note (Signed)
Low cholesterol diet.  Continue Lipitor.  Check lipid panel and liver function.

## 2013-07-02 NOTE — Assessment & Plan Note (Signed)
Low carb diet and exercise.  Follow.  

## 2013-07-02 NOTE — Assessment & Plan Note (Signed)
Last colonoscopy 05/01/10.  Six polys removed.  GI felt no further colonoscopy warranted.   

## 2013-07-02 NOTE — Assessment & Plan Note (Signed)
Persistent.  Desires no further intervention at this time.    

## 2013-08-01 ENCOUNTER — Encounter: Payer: Self-pay | Admitting: Internal Medicine

## 2013-08-21 ENCOUNTER — Telehealth: Payer: Self-pay | Admitting: Internal Medicine

## 2013-08-21 NOTE — Telephone Encounter (Signed)
I have started the referral process with [facility] after calling back to [facility] and given the Dx code of V10.82. The Auth number is still pending at this moment which is [id number]. I have spoke with patient and let her know that it was pending and I would check the status tomorrow. She asked if I would leave a message on home phone if she wasn't in.

## 2013-08-21 NOTE — Telephone Encounter (Signed)
Patient called back to see what was the status of this referral. I have called [facility] requesting dx codes so I can send the request to [facility]. I don't see that the patient has ever been referred to them.

## 2013-08-21 NOTE — Telephone Encounter (Signed)
Pt left vm stating she needs referral placed for [facility] for appt she has with Dr. Karle Barr at [facility] on Wednesday 6/10.

## 2013-08-22 NOTE — Telephone Encounter (Signed)
Looked on website patients referral has been auth with 6 visits from 08/23/13-02/22/14 CPT 99499 and DX code V10.82 to see Dr. Kellie Moor. Patient is aware.

## 2013-10-26 ENCOUNTER — Other Ambulatory Visit (INDEPENDENT_AMBULATORY_CARE_PROVIDER_SITE_OTHER): Payer: Commercial Managed Care - HMO

## 2013-10-26 DIAGNOSIS — I10 Essential (primary) hypertension: Secondary | ICD-10-CM

## 2013-10-26 DIAGNOSIS — R739 Hyperglycemia, unspecified: Secondary | ICD-10-CM

## 2013-10-26 DIAGNOSIS — R7309 Other abnormal glucose: Secondary | ICD-10-CM

## 2013-10-26 DIAGNOSIS — E7800 Pure hypercholesterolemia, unspecified: Secondary | ICD-10-CM

## 2013-10-26 LAB — BASIC METABOLIC PANEL
BUN: 16 mg/dL (ref 6–23)
CO2: 27 mEq/L (ref 19–32)
Calcium: 9.8 mg/dL (ref 8.4–10.5)
Chloride: 102 mEq/L (ref 96–112)
Creatinine, Ser: 0.9 mg/dL (ref 0.4–1.2)
GFR: 67.21 mL/min (ref >60.00)
GLUCOSE: 95 mg/dL (ref 70–99)
POTASSIUM: 4.6 meq/L (ref 3.5–5.1)
SODIUM: 136 meq/L (ref 135–145)

## 2013-10-26 LAB — HEPATIC FUNCTION PANEL
ALT: 16 U/L (ref 0–35)
AST: 19 U/L (ref 0–37)
Albumin: 3.9 g/dL (ref 3.5–5.2)
Alkaline Phosphatase: 54 U/L (ref 39–117)
Bilirubin, Direct: 0.1 mg/dL (ref 0.0–0.3)
Total Bilirubin: 0.8 mg/dL (ref 0.2–1.2)
Total Protein: 6.1 g/dL (ref 6.0–8.3)

## 2013-10-26 LAB — LIPID PANEL
CHOLESTEROL: 263 mg/dL — AB (ref 0–200)
HDL: 86.6 mg/dL (ref >39.00)
LDL Cholesterol: 165 mg/dL — ABNORMAL HIGH (ref 0–99)
NONHDL: 176.4
Total CHOL/HDL Ratio: 3
Triglycerides: 56 mg/dL (ref 0.0–149.0)
VLDL: 11.2 mg/dL (ref 0.0–40.0)

## 2013-10-26 LAB — HEMOGLOBIN A1C: Hgb A1c MFr Bld: 5.5 % (ref 4.6–6.5)

## 2013-10-30 ENCOUNTER — Ambulatory Visit (INDEPENDENT_AMBULATORY_CARE_PROVIDER_SITE_OTHER): Payer: Commercial Managed Care - HMO | Admitting: Internal Medicine

## 2013-10-30 ENCOUNTER — Encounter: Payer: Self-pay | Admitting: Internal Medicine

## 2013-10-30 VITALS — BP 120/70 | HR 64 | Temp 97.8°F | Ht 60.5 in | Wt 150.5 lb

## 2013-10-30 DIAGNOSIS — R928 Other abnormal and inconclusive findings on diagnostic imaging of breast: Secondary | ICD-10-CM

## 2013-10-30 DIAGNOSIS — E7800 Pure hypercholesterolemia, unspecified: Secondary | ICD-10-CM

## 2013-10-30 DIAGNOSIS — R739 Hyperglycemia, unspecified: Secondary | ICD-10-CM

## 2013-10-30 DIAGNOSIS — Z860100 Personal history of colon polyps, unspecified: Secondary | ICD-10-CM

## 2013-10-30 DIAGNOSIS — R7309 Other abnormal glucose: Secondary | ICD-10-CM

## 2013-10-30 DIAGNOSIS — D72819 Decreased white blood cell count, unspecified: Secondary | ICD-10-CM

## 2013-10-30 DIAGNOSIS — M25569 Pain in unspecified knee: Secondary | ICD-10-CM

## 2013-10-30 DIAGNOSIS — M25562 Pain in left knee: Secondary | ICD-10-CM

## 2013-10-30 DIAGNOSIS — I10 Essential (primary) hypertension: Secondary | ICD-10-CM

## 2013-10-30 NOTE — Progress Notes (Signed)
Pre visit review using our clinic review tool, if applicable. No additional management support is needed unless otherwise documented below in the visit note. 

## 2013-11-05 ENCOUNTER — Encounter: Payer: Self-pay | Admitting: Internal Medicine

## 2013-11-05 NOTE — Assessment & Plan Note (Signed)
Last colonoscopy 05/01/10.  Six polys removed.  GI felt no further colonoscopy warranted.

## 2013-11-05 NOTE — Assessment & Plan Note (Signed)
Low carb diet and exercise.  Follow.  

## 2013-11-05 NOTE — Assessment & Plan Note (Signed)
Persistent.  Desires no further intervention at this time.

## 2013-11-05 NOTE — Assessment & Plan Note (Signed)
Blood pressure has been under good control.  Follow met b.  Continue same meds.

## 2013-11-05 NOTE — Assessment & Plan Note (Signed)
Low cholesterol diet.  Continue Lipitor.  Follow lipid panel and liver function.

## 2013-11-05 NOTE — Progress Notes (Signed)
Subjective:    Patient ID: Erin [name], female    DOB: Jul 05, 1930, 80 y.o.   MRN: [id number]  HPI 80 year old female with past history of hypertension and hypercholesterolemia who comes in today for a scheduled follow up.  She reports increased stress with her husband's health issues.  She feels she is handling things relatively well.  Desires no further intervention.  No bowel change.  Eating and drinking well.  No nausea or vomiting.  Stays active.  Exercises.  No chest pain or tightness with increased activity or exertion.  Breathing stable.  Still has issues with her left knee.  Desires no further intervention.  She is able to do the things she needs to do.  Overall she feels she is doing relatively well.     Past Medical History  Diagnosis Date  . Hypertension   . Hypercholesterolemia   . History of colon polyps     Current Outpatient Prescriptions on File Prior to Visit  Medication Sig Dispense Refill  . aspirin 81 MG tablet Take 81 mg by mouth daily.      . benazepril (LOTENSIN) 40 MG tablet Take 1 tablet (40 mg total) by mouth 2 (two) times daily.  60 tablet  5  . Calcium Carbonate-Vitamin D (CALCIUM 600/VITAMIN D) 600-400 MG-UNIT per tablet Take 1 tablet by mouth daily.      . felodipine (PLENDIL) 2.5 MG 24 hr tablet Take 1 tablet (2.5 mg total) by mouth daily.  30 tablet  5  . furosemide (LASIX) 20 MG tablet Take 1 tablet (20 mg total) by mouth daily.  30 tablet  5  . Multiple Vitamin (MULTIVITAMIN) tablet Take 1 tablet by mouth daily.      . niacin 250 MG tablet Take 500 mg by mouth at bedtime.       . Omega-3 Fatty Acids (FISH OIL) 1000 MG CAPS Take by mouth daily.       No current facility-administered medications on file prior to visit.    Review of Systems Patient denies any headache, lightheadedness or dizziness.  No significant sinus or allergy symptoms.  No chest pain, tightness or palpitations. No increased shortness of breath, cough or congestion.  No acid reflux,  dysphagia or odynophagia.  No nausea or vomiting.  No bowel change, such as diarrhea, constipation, BRBPR or melana.  No urine change.   No history of kidney stones.  No back pain.  Left knee pain and discomfort as outlined.  Desires no further intervention.        Objective:   Physical Exam  Filed Vitals:   10/30/13 1102  BP: 120/70  Pulse: 64  Temp: 97.8 F (36.6 C)   Blood pressure recheck:  10/15  80 year old female in no acute distress.   HEENT:  Nares- clear.  Oropharynx - without lesions. NECK:  Supple.  Nontender.  No audible bruit.  HEART:  Appears to be regular. LUNGS:  No crackles or wheezing audible.  Respirations even and unlabored.  RADIAL PULSE:  Equal bilaterally.   ABDOMEN:  Soft, nontender.  Bowel sounds present and normal.  No audible abdominal bruit.     EXTREMITIES:  No increased edema present.  DP pulses palpable and equal bilaterally.    Assessment & Plan:  CARDIOVASCULAR.  Asymptomatic.    OPTHALMOLOGY.  S/P cataract surgery 12/25/11.  Doing well.  Continue to follow up with Opthalmology.   HEALTH MAINTENANCE.  Physical 06/28/13..  Colonoscopy 05/01/10 - 6 polyps.  Per GI - no further colonoscopy warranted. Mammogram 04/13/12 - Birads II.   Just had mammogram 04/25/13 recommended f/u views.  F/u views 05/09/13 revealed a probable benign area.  Recommend a f/u left breast mammo in 6 months.  Schedule a f/u mammogram.  Bone density 03/19/10 - normal.

## 2013-11-06 ENCOUNTER — Telehealth: Payer: Self-pay

## 2013-11-06 DIAGNOSIS — R928 Other abnormal and inconclusive findings on diagnostic imaging of breast: Secondary | ICD-10-CM

## 2013-11-06 NOTE — Telephone Encounter (Signed)
Order placed for left breast ultrasound.  

## 2013-11-06 NOTE — Telephone Encounter (Signed)
The radiology dept called and stated the patient needs an order for an ultra sound of her left breast.  They have the diagnostic unilateral left ultra sound, but are hoping for an additional order.

## 2013-11-08 ENCOUNTER — Ambulatory Visit: Payer: Self-pay | Admitting: Internal Medicine

## 2013-11-10 ENCOUNTER — Encounter: Payer: Self-pay | Admitting: Internal Medicine

## 2013-11-11 ENCOUNTER — Encounter: Payer: Self-pay | Admitting: Internal Medicine

## 2013-11-11 DIAGNOSIS — R928 Other abnormal and inconclusive findings on diagnostic imaging of breast: Secondary | ICD-10-CM | POA: Insufficient documentation

## 2013-11-13 ENCOUNTER — Other Ambulatory Visit: Payer: Self-pay | Admitting: *Deleted

## 2013-11-13 MED ORDER — FUROSEMIDE 20 MG PO TABS: 20.0000 mg | ORAL_TABLET | Freq: Every day | ORAL | Status: DC

## 2014-01-11 ENCOUNTER — Other Ambulatory Visit: Payer: Self-pay | Admitting: *Deleted

## 2014-01-11 MED ORDER — FELODIPINE ER 2.5 MG PO TB24: 2.5000 mg | ORAL_TABLET | Freq: Every day | ORAL | Status: DC

## 2014-01-11 MED ORDER — BENAZEPRIL HCL 40 MG PO TABS: 40.0000 mg | ORAL_TABLET | Freq: Two times a day (BID) | ORAL | Status: DC

## 2014-02-16 ENCOUNTER — Other Ambulatory Visit (INDEPENDENT_AMBULATORY_CARE_PROVIDER_SITE_OTHER): Payer: Commercial Managed Care - HMO

## 2014-02-16 DIAGNOSIS — E7800 Pure hypercholesterolemia, unspecified: Secondary | ICD-10-CM

## 2014-02-16 DIAGNOSIS — D72819 Decreased white blood cell count, unspecified: Secondary | ICD-10-CM

## 2014-02-16 DIAGNOSIS — I10 Essential (primary) hypertension: Secondary | ICD-10-CM

## 2014-02-16 DIAGNOSIS — R739 Hyperglycemia, unspecified: Secondary | ICD-10-CM

## 2014-02-18 LAB — HEPATIC FUNCTION PANEL
ALK PHOS: 44 U/L (ref 39–117)
ALT: 12 U/L (ref 0–35)
AST: 19 U/L (ref 0–37)
Albumin: 4.1 g/dL (ref 3.5–5.2)
BILIRUBIN DIRECT: 0.1 mg/dL (ref 0.0–0.3)
BILIRUBIN TOTAL: 0.9 mg/dL (ref 0.2–1.2)
TOTAL PROTEIN: 6.5 g/dL (ref 6.0–8.3)

## 2014-02-18 LAB — BASIC METABOLIC PANEL
BUN: 32 mg/dL — ABNORMAL HIGH (ref 6–23)
CALCIUM: 10.2 mg/dL (ref 8.4–10.5)
CO2: 28 mEq/L (ref 19–32)
CREATININE: 1 mg/dL (ref 0.4–1.2)
Chloride: 102 mEq/L (ref 96–112)
GFR: 55.78 mL/min — AB (ref >60.00)
GLUCOSE: 105 mg/dL — AB (ref 70–99)
Potassium: 4.6 mEq/L (ref 3.5–5.1)
Sodium: 138 mEq/L (ref 135–145)

## 2014-02-18 LAB — CBC WITH DIFFERENTIAL/PLATELET
BASOS ABS: 0 10*3/uL (ref 0.0–0.1)
Basophils Relative: 0.3 % (ref 0.0–3.0)
Eosinophils Absolute: 0.1 10*3/uL (ref 0.0–0.7)
Eosinophils Relative: 2.1 % (ref 0.0–5.0)
HCT: 40 % (ref 36.0–46.0)
Hemoglobin: 13.2 g/dL (ref 12.0–15.0)
LYMPHS ABS: 0.8 10*3/uL (ref 0.7–4.0)
Lymphocytes Relative: 21.4 % (ref 12.0–46.0)
MCHC: 33 g/dL (ref 30.0–36.0)
MCV: 99.9 fl (ref 78.0–100.0)
MONO ABS: 0.3 10*3/uL (ref 0.1–1.0)
Monocytes Relative: 9 % (ref 3.0–12.0)
NEUTROS PCT: 67.2 % (ref 43.0–77.0)
Neutro Abs: 2.6 10*3/uL (ref 1.4–7.7)
PLATELETS: 210 10*3/uL (ref 150.0–400.0)
RBC: 4 Mil/uL (ref 3.87–5.11)
RDW: 13.5 % (ref 11.5–15.5)
WBC: 3.9 10*3/uL — ABNORMAL LOW (ref 4.0–10.5)

## 2014-02-18 LAB — LIPID PANEL
CHOLESTEROL: 274 mg/dL — AB (ref 0–200)
HDL: 99.5 mg/dL (ref >39.00)
LDL CALC: 163 mg/dL — AB (ref 0–99)
NonHDL: 174.5
TRIGLYCERIDES: 56 mg/dL (ref 0.0–149.0)
Total CHOL/HDL Ratio: 3
VLDL: 11.2 mg/dL (ref 0.0–40.0)

## 2014-02-18 LAB — HEMOGLOBIN A1C: HEMOGLOBIN A1C: 5.7 % (ref 4.6–6.5)

## 2014-02-19 ENCOUNTER — Encounter: Payer: Self-pay | Admitting: Internal Medicine

## 2014-02-19 ENCOUNTER — Ambulatory Visit (INDEPENDENT_AMBULATORY_CARE_PROVIDER_SITE_OTHER): Payer: Commercial Managed Care - HMO | Admitting: Internal Medicine

## 2014-02-19 VITALS — BP 110/60 | HR 102 | Temp 97.8°F | Ht 60.5 in | Wt 149.5 lb

## 2014-02-19 DIAGNOSIS — R739 Hyperglycemia, unspecified: Secondary | ICD-10-CM

## 2014-02-19 DIAGNOSIS — E7800 Pure hypercholesterolemia, unspecified: Secondary | ICD-10-CM

## 2014-02-19 DIAGNOSIS — Z86010 Personal history of colonic polyps: Secondary | ICD-10-CM

## 2014-02-19 DIAGNOSIS — R928 Other abnormal and inconclusive findings on diagnostic imaging of breast: Secondary | ICD-10-CM

## 2014-02-19 DIAGNOSIS — M25562 Pain in left knee: Secondary | ICD-10-CM

## 2014-02-19 DIAGNOSIS — D72819 Decreased white blood cell count, unspecified: Secondary | ICD-10-CM

## 2014-02-19 DIAGNOSIS — I10 Essential (primary) hypertension: Secondary | ICD-10-CM

## 2014-02-19 MED ORDER — EZETIMIBE 10 MG PO TABS: 10.0000 mg | ORAL_TABLET | Freq: Every day | ORAL | Status: DC

## 2014-02-19 NOTE — Progress Notes (Signed)
Pre visit review using our clinic review tool, if applicable. No additional management support is needed unless otherwise documented below in the visit note. 

## 2014-02-19 NOTE — Progress Notes (Signed)
Subjective:    Patient ID: Erin [name], female    DOB: Nov 01, 1930, 80 y.o.   MRN: [id number]  HPI 80 year old female with past history of hypertension and hypercholesterolemia who comes in today for a scheduled follow up.  She reports increased stress with her husband's health issues.  She feels she is handling things relatively well.  Desires no further intervention.  No bowel change.  Eating and drinking well.  No nausea or vomiting.  Stays active.  Exercises.  No chest pain or tightness with increased activity or exertion.  Breathing stable.  Still has issues with her left knee.  Desires no further intervention.  Cholesterol is elevated.  Discussed treatment options.  She desires not to take statin medications. Was questioning taking zetia.      Past Medical History  Diagnosis Date  . Hypertension   . Hypercholesterolemia   . History of colon polyps     Current Outpatient Prescriptions on File Prior to Visit  Medication Sig Dispense Refill  . aspirin 81 MG tablet Take 81 mg by mouth daily.    . benazepril (LOTENSIN) 40 MG tablet Take 1 tablet (40 mg total) by mouth 2 (two) times daily. 60 tablet 5  . Calcium Carbonate-Vitamin D (CALCIUM 600/VITAMIN D) 600-400 MG-UNIT per tablet Take 1 tablet by mouth daily.    . felodipine (PLENDIL) 2.5 MG 24 hr tablet Take 1 tablet (2.5 mg total) by mouth daily. 30 tablet 5  . furosemide (LASIX) 20 MG tablet Take 1 tablet (20 mg total) by mouth daily. 30 tablet 5  . Multiple Vitamin (MULTIVITAMIN) tablet Take 1 tablet by mouth daily.    . niacin 250 MG tablet Take 500 mg by mouth at bedtime.     . Omega-3 Fatty Acids (FISH OIL) 1000 MG CAPS Take by mouth daily.     No current facility-administered medications on file prior to visit.    Review of Systems Patient denies any headache, lightheadedness or dizziness.  No significant sinus or allergy symptoms.  No chest pain, tightness or palpitations. No increased shortness of breath, cough or  congestion.  No acid reflux, dysphagia or odynophagia.  No nausea or vomiting.  No bowel change, such as diarrhea, constipation, BRBPR or melana.  No urine change.   No history of kidney stones.  No back pain.  Left knee pain and discomfort as outlined.  Desires no further intervention.        Objective:   Physical Exam  Filed Vitals:   02/19/14 0950  BP: 110/60  Pulse: 102  Temp: 97.8 F (52.42 C)   80 year old female in no acute distress.   HEENT:  Nares- clear.  Oropharynx - without lesions. NECK:  Supple.  Nontender.  No audible bruit.  HEART:  Appears to be regular. LUNGS:  No crackles or wheezing audible.  Respirations even and unlabored.  RADIAL PULSE:  Equal bilaterally.   ABDOMEN:  Soft, nontender.  Bowel sounds present and normal.  No audible abdominal bruit.     EXTREMITIES:  No increased edema present.  DP pulses palpable and equal bilaterally.    Assessment & Plan:  1. Essential hypertension Blood pressure doing well.  Follow.  Same medication regimen. - Basic metabolic panel; Future  2. Hypercholesterolemia Low cholesterol diet and exercise.  Declines statin medication.  Start zetia.   - Lipid panel; Future - Hepatic function panel; Future Lab Results  Component Value Date   CHOL 274* 02/16/2014   HDL  99.50 02/16/2014   LDLCALC 163* 02/16/2014   TRIG 56.0 02/16/2014   CHOLHDL 3 02/16/2014   3. Hyperglycemia Low carb diet and exercise.  Follow.   - Hemoglobin A1c; Future  4. History of colonic polyps Colonoscopy 05/01/10 - 6 polyps.  Per GI, no further colonoscopy warranted.    5. Left knee pain Stable.  Desires no further w/up at this time.    6. Abnormal mammogram See below.  Due f/u mammogram in 04/2014.    7. Leukopenia White count is stable.   - CBC with Differential; Future  8. CARDIOVASCULAR.  Asymptomatic.    HEALTH MAINTENANCE.  Physical 06/28/13..  Colonoscopy 05/01/10 - 6 polyps.  Per GI - no further colonoscopy warranted. Mammogram  04/13/12 - Birads II.   Just had mammogram 04/25/13 recommended f/u views.  F/u views 05/09/13 revealed a probable benign area.  Recommend a f/u left breast mammo in 6 months.  Left breast mammogram 11/08/13 - Birads I.   Recommend bilateral mammogram in 04/2014.  Schedule.  Bone density 03/19/10 - normal.

## 2014-02-23 ENCOUNTER — Other Ambulatory Visit: Payer: Commercial Managed Care - HMO

## 2014-02-25 ENCOUNTER — Encounter: Payer: Self-pay | Admitting: Internal Medicine

## 2014-02-25 DIAGNOSIS — D72819 Decreased white blood cell count, unspecified: Secondary | ICD-10-CM | POA: Insufficient documentation

## 2014-02-26 ENCOUNTER — Ambulatory Visit: Payer: Commercial Managed Care - HMO | Admitting: Internal Medicine

## 2014-06-12 ENCOUNTER — Other Ambulatory Visit: Payer: Self-pay

## 2014-06-12 MED ORDER — FUROSEMIDE 20 MG PO TABS: 20.0000 mg | ORAL_TABLET | Freq: Every day | ORAL | Status: DC

## 2014-06-13 ENCOUNTER — Other Ambulatory Visit (INDEPENDENT_AMBULATORY_CARE_PROVIDER_SITE_OTHER): Payer: Commercial Managed Care - HMO

## 2014-06-13 DIAGNOSIS — E7800 Pure hypercholesterolemia, unspecified: Secondary | ICD-10-CM

## 2014-06-13 DIAGNOSIS — D72819 Decreased white blood cell count, unspecified: Secondary | ICD-10-CM

## 2014-06-13 DIAGNOSIS — R739 Hyperglycemia, unspecified: Secondary | ICD-10-CM | POA: Diagnosis not present

## 2014-06-13 DIAGNOSIS — I10 Essential (primary) hypertension: Secondary | ICD-10-CM | POA: Diagnosis not present

## 2014-06-13 LAB — HEPATIC FUNCTION PANEL
ALK PHOS: 52 U/L (ref 39–117)
ALT: 11 U/L (ref 0–35)
AST: 15 U/L (ref 0–37)
Albumin: 4.1 g/dL (ref 3.5–5.2)
BILIRUBIN DIRECT: 0.1 mg/dL (ref 0.0–0.3)
BILIRUBIN TOTAL: 0.5 mg/dL (ref 0.2–1.2)
Total Protein: 6.9 g/dL (ref 6.0–8.3)

## 2014-06-13 LAB — BASIC METABOLIC PANEL
BUN: 22 mg/dL (ref 6–23)
CALCIUM: 10.2 mg/dL (ref 8.4–10.5)
CO2: 30 mEq/L (ref 19–32)
Chloride: 101 mEq/L (ref 96–112)
Creatinine, Ser: 0.85 mg/dL (ref 0.40–1.20)
GFR: 68.02 mL/min (ref >60.00)
Glucose, Bld: 97 mg/dL (ref 70–99)
Potassium: 4.5 mEq/L (ref 3.5–5.1)
SODIUM: 136 meq/L (ref 135–145)

## 2014-06-13 LAB — LIPID PANEL
Cholesterol: 262 mg/dL — ABNORMAL HIGH (ref 0–200)
HDL: 86.4 mg/dL (ref >39.00)
LDL Cholesterol: 160 mg/dL — ABNORMAL HIGH (ref 0–99)
NONHDL: 175.6
Total CHOL/HDL Ratio: 3
Triglycerides: 78 mg/dL (ref 0.0–149.0)
VLDL: 15.6 mg/dL (ref 0.0–40.0)

## 2014-06-13 LAB — CBC WITH DIFFERENTIAL/PLATELET
Basophils Absolute: 0 10*3/uL (ref 0.0–0.1)
Basophils Relative: 0.6 % (ref 0.0–3.0)
Eosinophils Absolute: 0.1 10*3/uL (ref 0.0–0.7)
Eosinophils Relative: 2.6 % (ref 0.0–5.0)
HCT: 40.3 % (ref 36.0–46.0)
HEMOGLOBIN: 14 g/dL (ref 12.0–15.0)
Lymphocytes Relative: 26.2 % (ref 12.0–46.0)
Lymphs Abs: 1 10*3/uL (ref 0.7–4.0)
MCHC: 34.7 g/dL (ref 30.0–36.0)
MCV: 94.8 fl (ref 78.0–100.0)
Monocytes Absolute: 0.5 10*3/uL (ref 0.1–1.0)
Monocytes Relative: 12.3 % — ABNORMAL HIGH (ref 3.0–12.0)
Neutro Abs: 2.2 10*3/uL (ref 1.4–7.7)
Neutrophils Relative %: 58.3 % (ref 43.0–77.0)
Platelets: 224 10*3/uL (ref 150.0–400.0)
RBC: 4.25 Mil/uL (ref 3.87–5.11)
RDW: 13.7 % (ref 11.5–15.5)
WBC: 3.7 10*3/uL — ABNORMAL LOW (ref 4.0–10.5)

## 2014-06-13 LAB — HEMOGLOBIN A1C: Hgb A1c MFr Bld: 5.6 % (ref 4.6–6.5)

## 2014-06-20 ENCOUNTER — Ambulatory Visit (INDEPENDENT_AMBULATORY_CARE_PROVIDER_SITE_OTHER): Payer: Commercial Managed Care - HMO | Admitting: Internal Medicine

## 2014-06-20 ENCOUNTER — Encounter: Payer: Self-pay | Admitting: Internal Medicine

## 2014-06-20 VITALS — BP 110/60 | HR 69 | Temp 98.1°F | Ht 60.5 in | Wt 146.0 lb

## 2014-06-20 DIAGNOSIS — D72819 Decreased white blood cell count, unspecified: Secondary | ICD-10-CM

## 2014-06-20 DIAGNOSIS — R928 Other abnormal and inconclusive findings on diagnostic imaging of breast: Secondary | ICD-10-CM

## 2014-06-20 DIAGNOSIS — E7800 Pure hypercholesterolemia, unspecified: Secondary | ICD-10-CM

## 2014-06-20 DIAGNOSIS — F439 Reaction to severe stress, unspecified: Secondary | ICD-10-CM

## 2014-06-20 DIAGNOSIS — Z860100 Personal history of colon polyps, unspecified: Secondary | ICD-10-CM

## 2014-06-20 DIAGNOSIS — I10 Essential (primary) hypertension: Secondary | ICD-10-CM

## 2014-06-20 DIAGNOSIS — Z658 Other specified problems related to psychosocial circumstances: Secondary | ICD-10-CM

## 2014-06-20 DIAGNOSIS — R739 Hyperglycemia, unspecified: Secondary | ICD-10-CM

## 2014-06-20 NOTE — Progress Notes (Signed)
Patient ID: Erin [name], female   DOB: 09/27/1930, 81 y.o.   MRN: [id number]   Subjective:    Patient ID: Erin [name], female    DOB: 1930-04-14, 81 y.o.   MRN: [id number]  HPI  Patient here for a scheduled follow up.  Increased stress with her husband's medical issues.  Feels she is coping relatively well.  Sleeping.  Eating and drinking well.  No cardiac symptoms with increased activity or exertion.  Breathing stable.  Bowels stable.     Past Medical History  Diagnosis Date  . Hypertension   . Hypercholesterolemia   . History of colon polyps     Current Outpatient Prescriptions on File Prior to Visit  Medication Sig Dispense Refill  . aspirin 81 MG tablet Take 81 mg by mouth daily.    . benazepril (LOTENSIN) 40 MG tablet Take 1 tablet (40 mg total) by mouth 2 (two) times daily. 60 tablet 5  . Calcium Carbonate-Vitamin D (CALCIUM 600/VITAMIN D) 600-400 MG-UNIT per tablet Take 1 tablet by mouth daily.    . felodipine (PLENDIL) 2.5 MG 24 hr tablet Take 1 tablet (2.5 mg total) by mouth daily. 30 tablet 5  . furosemide (LASIX) 20 MG tablet Take 1 tablet (20 mg total) by mouth daily. 30 tablet 5  . Multiple Vitamin (MULTIVITAMIN) tablet Take 1 tablet by mouth daily.    . Omega-3 Fatty Acids (FISH OIL) 1000 MG CAPS Take by mouth daily.     No current facility-administered medications on file prior to visit.    Review of Systems  Constitutional: Negative for appetite change and unexpected weight change.  HENT: Negative for congestion and sinus pressure.   Respiratory: Negative for cough, chest tightness and shortness of breath.   Cardiovascular: Negative for chest pain, palpitations and leg swelling.  Gastrointestinal: Negative for nausea, vomiting, abdominal pain and diarrhea.  Musculoskeletal: Negative for joint swelling.  Neurological: Negative for dizziness, light-headedness and headaches.  Psychiatric/Behavioral: Negative for dysphoric mood and agitation.       Objective:    Physical Exam  Constitutional: She appears well-developed and well-nourished. No distress.  HENT:  Nose: Nose normal.  Mouth/Throat: Oropharynx is clear and moist.  Neck: Neck supple. No thyromegaly present.  Cardiovascular: Normal rate and regular rhythm.   Pulmonary/Chest: Breath sounds normal. No respiratory distress. She has no wheezes.  Abdominal: Soft. Bowel sounds are normal. There is no tenderness.  Musculoskeletal: She exhibits no edema or tenderness.  Lymphadenopathy:    She has no cervical adenopathy.  Skin: No rash noted. No erythema.  Psychiatric: She has a normal mood and affect. Her behavior is normal.    BP 110/60 mmHg  Pulse 69  Temp(Src) 98.1 F (36.7 C) (Oral)  Ht 5' 0.5" (1.537 m)  Wt 146 lb (66.225 kg)  BMI 28.03 kg/m2  SpO2 94% Wt Readings from Last 3 Encounters:  06/20/14 146 lb (66.225 kg)  02/19/14 149 lb 8 oz (67.813 kg)  10/30/13 150 lb 8 oz (68.266 kg)     Lab Results  Component Value Date   WBC 3.7* 06/13/2014   HGB 14.0 06/13/2014   HCT 40.3 06/13/2014   PLT 224.0 06/13/2014   GLUCOSE 97 06/13/2014   CHOL 262* 06/13/2014   TRIG 78.0 06/13/2014   HDL 86.40 06/13/2014   LDLCALC 160* 06/13/2014   ALT 11 06/13/2014   AST 15 06/13/2014   NA 136 06/13/2014   K 4.5 06/13/2014   CL 101 06/13/2014   CREATININE 0.85  06/13/2014   BUN 22 06/13/2014   CO2 30 06/13/2014   TSH 1.24 05/31/2013   HGBA1C 5.6 06/13/2014       Assessment & Plan:   Problem List Items Addressed This Visit    Abnormal mammogram    F/u left breast mammo 10/2013.  Due f/u mammogram.  Schedule.        History of colonic polyps    Last colonoscopy 05/01/10.  Six polyps removed.  GI felt no further colonoscopy warranted.        Hypercholesterolemia    Low cholesterol idet and exercise.  Follow lipid panel.        Relevant Orders   Lipid panel   Hepatic function panel   Hyperglycemia    Low carb diet and exercise.  Follow met b and a1c.        Relevant  Orders   Hemoglobin A1c   Hypertension - Primary    Blood pressure doing well.  Same medication regimen.  Follow pressures.  Follow metabolic panel.       Relevant Orders   Basic metabolic panel   Leukopenia    White cell count relatively stable.  Decreased, but stable.  Recheck cbc with next labs.        Relevant Orders   CBC with Differential/Platelet   Stress    Increased stress with her husband's medical issues.  Feels she is coping relatively well.  Follow.        Relevant Orders   TSH     I spent 25 minutes with the patient and more than 50% of the time was spent in consultation regarding the above.     Einar Pheasant, MD

## 2014-06-20 NOTE — Progress Notes (Signed)
Pre visit review using our clinic review tool, if applicable. No additional management support is needed unless otherwise documented below in the visit note. 

## 2014-06-21 ENCOUNTER — Ambulatory Visit: Payer: Commercial Managed Care - HMO | Admitting: Internal Medicine

## 2014-06-24 ENCOUNTER — Encounter: Payer: Self-pay | Admitting: Internal Medicine

## 2014-06-24 DIAGNOSIS — F439 Reaction to severe stress, unspecified: Secondary | ICD-10-CM | POA: Insufficient documentation

## 2014-06-24 NOTE — Assessment & Plan Note (Signed)
Increased stress with her husband's medical issues.  Feels she is coping relatively well.  Follow.

## 2014-06-24 NOTE — Assessment & Plan Note (Signed)
Low carb diet and exercise.  Follow met b and a1c.   

## 2014-06-24 NOTE — Assessment & Plan Note (Signed)
Blood pressure doing well.  Same medication regimen.  Follow pressures.  Follow metabolic panel.   

## 2014-06-24 NOTE — Assessment & Plan Note (Signed)
Last colonoscopy 05/01/10.  Six polyps removed.  GI felt no further colonoscopy warranted.

## 2014-06-24 NOTE — Assessment & Plan Note (Signed)
White cell count relatively stable.  Decreased, but stable.  Recheck cbc with next labs.

## 2014-06-24 NOTE — Assessment & Plan Note (Signed)
F/u left breast mammo 10/2013.  Due f/u mammogram.  Schedule.

## 2014-06-24 NOTE — Assessment & Plan Note (Signed)
Low cholesterol idet and exercise.  Follow lipid panel.   

## 2014-06-29 ENCOUNTER — Ambulatory Visit
Admit: 2014-06-29 | Disposition: A | Discharge status: Home / Self Care | Payer: Self-pay | Attending: Internal Medicine | Admitting: Internal Medicine

## 2014-06-29 LAB — HM MAMMOGRAPHY: HM Mammogram: NEGATIVE

## 2014-07-03 NOTE — Op Note (Signed)
PATIENT NAMESURAIYA, Erin [name] MR#:  [id number] DATE OF BIRTH:  05-18-1930  DATE OF PROCEDURE:  12/25/2011  PREOPERATIVE DIAGNOSIS: Cataract, left eye.   POSTOPERATIVE DIAGNOSIS: Cataract, left eye.   PROCEDURE PERFORMED: Extracapsular cataract extraction using phacoemulsification with placement of an Alcon [id number] 18.5-diopter posterior chamber lens with 1.5 diopters of cylinder, serial # [id number].   SURGEON: Loura Back. Kayan Blissett, M.D.   ANESTHESIA: 4% lidocaine and 0.75% Marcaine in a 50-50 mixture with 10 units/mL of Hylenex added, given as a peribulbar.   ANESTHESIOLOGIST: Dr. Ronelle Nigh   COMPLICATIONS: None.   ESTIMATED BLOOD LOSS: Less than 1 mL.   DESCRIPTION OF PROCEDURE: The patient was brought to the operating room and topical proparacaine was placed in each eye. Sitting upright and fixing at a distant target, an Asico toric marker was used to mark the three and nine o'clock positions. The patient was placed supine, moved into position, and given IV sedation and a peribulbar block. She was then prepped and draped in the usual fashion. The vertical rectus muscles were imbricated using 5-0 silk sutures as bridle sutures. The toric marker was brought to the table and the 180 degrees were lined up with the three and nine o'clock positions. A mark was made at 169 degrees and also at 80 degrees superiorly. A limbal peritomy was carried out for one clock hour centered around the 80-degree mark, and hemostasis obtained with cautery. A partial thickness scleral groove was made at the posterior surgical limbus and dissected anteriorly into clear cornea with an Alcon crescent knife. The anterior chamber was entered superotemporally through clear cornea with a paracentesis knife and through the lamellar dissection with a 2.6-mm keratome. DisCoVisc was used to replace the aqueous and a continuous tear circular capsulorrhexis was carried out. Hydrodelineation was used to loosen the nucleus.  Phacoemulsification was carried out in a divide and conquer technique. Ultrasound time was 2 minutes and 2 seconds with an average power of 19.5% and CDE of 40.5. Irrigation-aspiration was used to remove the residual cortex. The capsular bag was inflated with DisCoVisc and the lens was inserted using a Librarian, academic. It was rotated and the marks on the haptics were lined up with the 169-egree marks. Irrigation-aspiration was used to remove the residual DisCoVisc. The position of the lens was confirmed. The wound was inflated with balanced salt and Miostat was injected through the paracentesis track to induce miosis and deepen the chamber. The wound was checked for leaks. None were found. The conjunctiva was closed with cautery. The bridle sutures were removed and two drops of Vigamox were placed on the eye. A shield was placed on the eye. The patient was discharged to the recovery area in good condition.    ____________________________ Loura Back Nautica Hotz, MD sad:bjt D: 12/25/2011 11:27:24 ET T: 12/25/2011 11:50:27 ET JOB#: [id number]  cc: Remo Lipps A. Daltyn Degroat, MD, <Dictator> Martie Lee MD ELECTRONICALLY SIGNED 12/28/2011 14:12

## 2014-08-14 ENCOUNTER — Other Ambulatory Visit: Payer: Self-pay | Admitting: *Deleted

## 2014-08-14 MED ORDER — BENAZEPRIL HCL 40 MG PO TABS: 40.0000 mg | ORAL_TABLET | Freq: Two times a day (BID) | ORAL | Status: DC

## 2014-08-14 MED ORDER — FELODIPINE ER 2.5 MG PO TB24: 2.5000 mg | ORAL_TABLET | Freq: Every day | ORAL | Status: DC

## 2014-08-15 ENCOUNTER — Telehealth: Payer: Self-pay | Admitting: *Deleted

## 2014-08-15 NOTE — Telephone Encounter (Signed)
Pt needs referral auth for upcoming routine eye exam with Dr. Thomasene Ripple on 08/29/14.

## 2014-08-15 NOTE — Telephone Encounter (Signed)
Message printed and given to [facility].

## 2014-10-22 ENCOUNTER — Other Ambulatory Visit (INDEPENDENT_AMBULATORY_CARE_PROVIDER_SITE_OTHER): Payer: Commercial Managed Care - HMO

## 2014-10-22 DIAGNOSIS — I10 Essential (primary) hypertension: Secondary | ICD-10-CM | POA: Diagnosis not present

## 2014-10-22 DIAGNOSIS — E7800 Pure hypercholesterolemia, unspecified: Secondary | ICD-10-CM

## 2014-10-22 DIAGNOSIS — R739 Hyperglycemia, unspecified: Secondary | ICD-10-CM

## 2014-10-22 DIAGNOSIS — D72819 Decreased white blood cell count, unspecified: Secondary | ICD-10-CM

## 2014-10-22 DIAGNOSIS — Z658 Other specified problems related to psychosocial circumstances: Secondary | ICD-10-CM | POA: Diagnosis not present

## 2014-10-22 DIAGNOSIS — F439 Reaction to severe stress, unspecified: Secondary | ICD-10-CM

## 2014-10-22 LAB — LIPID PANEL
CHOL/HDL RATIO: 3
CHOLESTEROL: 255 mg/dL — AB (ref 0–200)
HDL: 87.5 mg/dL (ref >39.00)
LDL Cholesterol: 151 mg/dL — ABNORMAL HIGH (ref 0–99)
NonHDL: 167.89
TRIGLYCERIDES: 86 mg/dL (ref 0.0–149.0)
VLDL: 17.2 mg/dL (ref 0.0–40.0)

## 2014-10-22 LAB — CBC WITH DIFFERENTIAL/PLATELET
Basophils Absolute: 0 10*3/uL (ref 0.0–0.1)
Basophils Relative: 0.4 % (ref 0.0–3.0)
EOS ABS: 0 10*3/uL (ref 0.0–0.7)
Eosinophils Relative: 1 % (ref 0.0–5.0)
HCT: 42.5 % (ref 36.0–46.0)
HEMOGLOBIN: 14.5 g/dL (ref 12.0–15.0)
LYMPHS ABS: 0.9 10*3/uL (ref 0.7–4.0)
LYMPHS PCT: 20.9 % (ref 12.0–46.0)
MCHC: 34 g/dL (ref 30.0–36.0)
MCV: 97.3 fl (ref 78.0–100.0)
Monocytes Absolute: 0.4 10*3/uL (ref 0.1–1.0)
Monocytes Relative: 10.5 % (ref 3.0–12.0)
Neutro Abs: 2.8 10*3/uL (ref 1.4–7.7)
Neutrophils Relative %: 67.2 % (ref 43.0–77.0)
PLATELETS: 239 10*3/uL (ref 150.0–400.0)
RBC: 4.37 Mil/uL (ref 3.87–5.11)
RDW: 13.7 % (ref 11.5–15.5)
WBC: 4.1 10*3/uL (ref 4.0–10.5)

## 2014-10-22 LAB — BASIC METABOLIC PANEL
BUN: 14 mg/dL (ref 6–23)
CO2: 30 mEq/L (ref 19–32)
Calcium: 10.3 mg/dL (ref 8.4–10.5)
Chloride: 100 mEq/L (ref 96–112)
Creatinine, Ser: 0.96 mg/dL (ref 0.40–1.20)
GFR: 59.05 mL/min — AB (ref >60.00)
GLUCOSE: 110 mg/dL — AB (ref 70–99)
Potassium: 4.7 mEq/L (ref 3.5–5.1)
Sodium: 139 mEq/L (ref 135–145)

## 2014-10-22 LAB — HEPATIC FUNCTION PANEL
ALBUMIN: 4.3 g/dL (ref 3.5–5.2)
ALT: 10 U/L (ref 0–35)
AST: 15 U/L (ref 0–37)
Alkaline Phosphatase: 52 U/L (ref 39–117)
BILIRUBIN DIRECT: 0.1 mg/dL (ref 0.0–0.3)
BILIRUBIN TOTAL: 0.6 mg/dL (ref 0.2–1.2)
Total Protein: 6.6 g/dL (ref 6.0–8.3)

## 2014-10-22 LAB — HEMOGLOBIN A1C: Hgb A1c MFr Bld: 5.2 % (ref 4.6–6.5)

## 2014-10-22 LAB — TSH: TSH: 0.95 u[IU]/mL (ref 0.35–4.50)

## 2014-10-24 ENCOUNTER — Ambulatory Visit (INDEPENDENT_AMBULATORY_CARE_PROVIDER_SITE_OTHER): Payer: Commercial Managed Care - HMO | Admitting: Internal Medicine

## 2014-10-24 ENCOUNTER — Encounter: Payer: Self-pay | Admitting: Internal Medicine

## 2014-10-24 VITALS — BP 104/66 | HR 69 | Temp 97.9°F | Ht 60.6 in | Wt 138.8 lb

## 2014-10-24 DIAGNOSIS — Z86010 Personal history of colonic polyps: Secondary | ICD-10-CM

## 2014-10-24 DIAGNOSIS — Z23 Encounter for immunization: Secondary | ICD-10-CM | POA: Diagnosis not present

## 2014-10-24 DIAGNOSIS — Z0000 Encounter for general adult medical examination without abnormal findings: Secondary | ICD-10-CM | POA: Diagnosis not present

## 2014-10-24 DIAGNOSIS — I10 Essential (primary) hypertension: Secondary | ICD-10-CM

## 2014-10-24 DIAGNOSIS — F439 Reaction to severe stress, unspecified: Secondary | ICD-10-CM

## 2014-10-24 DIAGNOSIS — R928 Other abnormal and inconclusive findings on diagnostic imaging of breast: Secondary | ICD-10-CM

## 2014-10-24 DIAGNOSIS — E7800 Pure hypercholesterolemia, unspecified: Secondary | ICD-10-CM

## 2014-10-24 DIAGNOSIS — D72819 Decreased white blood cell count, unspecified: Secondary | ICD-10-CM

## 2014-10-24 DIAGNOSIS — Z85820 Personal history of malignant melanoma of skin: Secondary | ICD-10-CM

## 2014-10-24 DIAGNOSIS — R739 Hyperglycemia, unspecified: Secondary | ICD-10-CM

## 2014-10-24 NOTE — Progress Notes (Signed)
Pre visit review using our clinic review tool, if applicable. No additional management support is needed unless otherwise documented below in the visit note. 

## 2014-10-24 NOTE — Progress Notes (Signed)
Patient ID: TWILLA KHOURI, female   DOB: 04/28/1930, 81 y.o.   MRN: [id number]   Subjective:    Patient ID: CARISSA MUSICK, female    DOB: [birth year], 81 y.o.   MRN: [id number]  HPI  Patient here to follow up on her current medical issues as well as for a complete physical exam.  Her husband recently passed.  Increased stress related to this.  She does not feel she needs any further intervention at this time. Has good support.  Has lost weight.  She is eating.  Has a good appetite.  Not eating the same as she did previously.  No nausea or vomiting.  Still staying active.  No cardiac symptoms with increased activity or exertion.  No sob.  No abdominal pain or cramping.  Bowels stable.  Has dermatology f/u in 01/2018.  Needs referral.     Past Medical History  Diagnosis Date  . Hypertension   . Hypercholesterolemia   . History of colon polyps    Family history and social history reviewed.    Outpatient Encounter Prescriptions as of 10/24/2014  Medication Sig  . aspirin 81 MG tablet Take 81 mg by mouth daily.  . benazepril (LOTENSIN) 40 MG tablet Take 1 tablet (40 mg total) by mouth 2 (two) times daily.  . Calcium Carbonate-Vitamin D (CALCIUM 600/VITAMIN D) 600-400 MG-UNIT per tablet Take 1 tablet by mouth daily.  . felodipine (PLENDIL) 2.5 MG 24 hr tablet Take 1 tablet (2.5 mg total) by mouth daily.  . furosemide (LASIX) 20 MG tablet Take 1 tablet (20 mg total) by mouth daily.  . Multiple Vitamin (MULTIVITAMIN) tablet Take 1 tablet by mouth daily.  . niacin 500 MG tablet Take 1,500 mg by mouth at bedtime.   . Omega-3 Fatty Acids (FISH OIL) 1000 MG CAPS Take by mouth daily.   No facility-administered encounter medications on file as of 10/24/2014.    Review of Systems  Constitutional: Negative for appetite change.       Some weight loss as outlined.    HENT: Negative for congestion and sinus pressure.   Eyes: Negative for pain and visual disturbance.  Respiratory: Negative for cough, chest  tightness and shortness of breath.   Cardiovascular: Negative for chest pain, palpitations and leg swelling.  Gastrointestinal: Negative for nausea, vomiting, abdominal pain and diarrhea.  Genitourinary: Negative for dysuria and difficulty urinating.  Musculoskeletal: Negative for back pain and joint swelling.  Skin: Negative for color change and rash.  Neurological: Negative for dizziness, light-headedness and headaches.  Hematological: Negative for adenopathy. Does not bruise/bleed easily.  Psychiatric/Behavioral: Negative for dysphoric mood and agitation.       Objective:    Physical Exam  Constitutional: She is oriented to person, place, and time. She appears well-developed and well-nourished.  HENT:  Nose: Nose normal.  Mouth/Throat: Oropharynx is clear and moist.  Eyes: Right eye exhibits no discharge. Left eye exhibits no discharge. No scleral icterus.  Neck: Neck supple. No thyromegaly present.  Cardiovascular: Normal rate and regular rhythm.   Pulmonary/Chest: Breath sounds normal. No accessory muscle usage. No tachypnea. No respiratory distress. She has no decreased breath sounds. She has no wheezes. She has no rhonchi. Right breast exhibits no inverted nipple, no mass, no nipple discharge and no tenderness (no axillary adenopathy). Left breast exhibits no inverted nipple, no mass, no nipple discharge and no tenderness (no axilarry adenopathy).  Abdominal: Soft. Bowel sounds are normal. There is no tenderness.  Musculoskeletal: She exhibits no  edema or tenderness.  Lymphadenopathy:    She has no cervical adenopathy.  Neurological: She is alert and oriented to person, place, and time.  Skin: Skin is warm. No rash noted.  Psychiatric: She has a normal mood and affect. Her behavior is normal.    BP 104/66 mmHg  Pulse 69  Temp(Src) 97.9 F (36.6 C) (Oral)  Ht 5' 0.6" (1.539 m)  Wt 138 lb 12.8 oz (62.959 kg)  BMI 26.58 kg/m2  SpO2 94% Wt Readings from Last 3 Encounters:    10/24/14 138 lb 12.8 oz (62.959 kg)  06/20/14 146 lb (66.225 kg)  02/19/14 149 lb 8 oz (67.813 kg)     Lab Results  Component Value Date   WBC 4.1 10/22/2014   HGB 14.5 10/22/2014   HCT 42.5 10/22/2014   PLT 239.0 10/22/2014   GLUCOSE 110* 10/22/2014   CHOL 255* 10/22/2014   TRIG 86.0 10/22/2014   HDL 87.50 10/22/2014   LDLCALC 151* 10/22/2014   ALT 10 10/22/2014   AST 15 10/22/2014   NA 139 10/22/2014   K 4.7 10/22/2014   CL 100 10/22/2014   CREATININE 0.96 10/22/2014   BUN 14 10/22/2014   CO2 30 10/22/2014   TSH 0.95 10/22/2014   HGBA1C 5.2 10/22/2014       Assessment & Plan:   Problem List Items Addressed This Visit    Abnormal mammogram    Mammogram 06/29/14 - [facility] I.        Health care maintenance    Physical today 10/24/14.  Mammogram 06/29/14 - Bir[facility] I.  Colonoscopy as outlined.        History of colonic polyps    Colonoscopy 05/01/10 - six polyps removed.  GI felt no further colonoscopy warranted.        History of melanoma    Due skin check.  Sees Dr Kellie Moor.  Needs referral.  Already has appt scheduled.        Relevant Orders   Ambulatory referral to Dermatology   Hypercholesterolemia    Low cholesterol diet and exercise.  She declines cholesterol medication.  Discussed with her today.  Follow lipid panel and liver function tests.   Lab Results  Component Value Date   CHOL 255* 10/22/2014   HDL 87.50 10/22/2014   LDLCALC 151* 10/22/2014   TRIG 86.0 10/22/2014   CHOLHDL 3 10/22/2014        Hyperglycemia    Low carb diet and exercise.  Follow met  b and a1c.       Hypertension    Blood pressure under good control.  Continue same medication regimen.  Follow pressures.  Follow metabolic panel.        Leukopenia    10/22/14 white count checked and wnl.  Follow.        Stress    Husband recently passed away.  Has good support.  Follow.        Other Visit Diagnoses    Routine general medical examination at a health care facility     -  Primary    Need for prophylactic vaccination against Streptococcus pneumoniae (pneumococcus)        Relevant Orders    Pneumococcal conjugate vaccine 13-valent (Completed)        Einar Pheasant, MD

## 2014-10-27 ENCOUNTER — Encounter: Payer: Self-pay | Admitting: Internal Medicine

## 2014-10-27 DIAGNOSIS — Z0000 Encounter for general adult medical examination without abnormal findings: Secondary | ICD-10-CM | POA: Insufficient documentation

## 2014-10-27 DIAGNOSIS — Z85820 Personal history of malignant melanoma of skin: Secondary | ICD-10-CM | POA: Insufficient documentation

## 2014-10-27 NOTE — Assessment & Plan Note (Signed)
Colonoscopy 05/01/10 - six polyps removed.  GI felt no further colonoscopy warranted.

## 2014-10-27 NOTE — Assessment & Plan Note (Signed)
Physical today 10/24/14.  Mammogram 06/29/14 - [facility] I.  Colonoscopy as outlined.

## 2014-10-27 NOTE — Assessment & Plan Note (Signed)
Husband recently passed away.  Has good support.  Follow.

## 2014-10-27 NOTE — Assessment & Plan Note (Signed)
10/22/14 white count checked and wnl.  Follow.

## 2014-10-27 NOTE — Assessment & Plan Note (Signed)
Mammogram 06/29/14 - birads I.

## 2014-10-27 NOTE — Assessment & Plan Note (Signed)
Blood pressure under good control.  Continue same medication regimen.  Follow pressures.  Follow metabolic panel.   

## 2014-10-27 NOTE — Assessment & Plan Note (Signed)
Low carb diet and exercise.  Follow met  b and a1c.  

## 2014-10-27 NOTE — Assessment & Plan Note (Signed)
Low cholesterol diet and exercise.  She declines cholesterol medication.  Discussed with her today.  Follow lipid panel and liver function tests.   Lab Results  Component Value Date   CHOL 255* 10/22/2014   HDL 87.50 10/22/2014   LDLCALC 151* 10/22/2014   TRIG 86.0 10/22/2014   CHOLHDL 3 10/22/2014

## 2014-10-27 NOTE — Assessment & Plan Note (Signed)
Due skin check.  Sees Dr Kellie Moor.  Needs referral.  Already has appt scheduled.

## 2014-12-19 ENCOUNTER — Encounter: Payer: Self-pay | Admitting: Internal Medicine

## 2015-01-14 ENCOUNTER — Other Ambulatory Visit: Payer: Self-pay

## 2015-01-14 MED ORDER — FUROSEMIDE 20 MG PO TABS: 20.0000 mg | ORAL_TABLET | Freq: Every day | ORAL | Status: DC

## 2015-01-18 ENCOUNTER — Telehealth: Payer: Self-pay | Admitting: *Deleted

## 2015-01-18 ENCOUNTER — Other Ambulatory Visit (INDEPENDENT_AMBULATORY_CARE_PROVIDER_SITE_OTHER): Payer: Commercial Managed Care - HMO

## 2015-01-18 DIAGNOSIS — R739 Hyperglycemia, unspecified: Secondary | ICD-10-CM

## 2015-01-18 DIAGNOSIS — Z85820 Personal history of malignant melanoma of skin: Secondary | ICD-10-CM

## 2015-01-18 DIAGNOSIS — I10 Essential (primary) hypertension: Secondary | ICD-10-CM

## 2015-01-18 DIAGNOSIS — E7800 Pure hypercholesterolemia, unspecified: Secondary | ICD-10-CM

## 2015-01-18 LAB — LIPID PANEL
CHOLESTEROL: 246 mg/dL — AB (ref 0–200)
HDL: 87.7 mg/dL (ref >39.00)
LDL CALC: 141 mg/dL — AB (ref 0–99)
NonHDL: 158.68
Total CHOL/HDL Ratio: 3
Triglycerides: 88 mg/dL (ref 0.0–149.0)
VLDL: 17.6 mg/dL (ref 0.0–40.0)

## 2015-01-18 LAB — HEPATIC FUNCTION PANEL
ALBUMIN: 4.2 g/dL (ref 3.5–5.2)
ALT: 13 U/L (ref 0–35)
AST: 16 U/L (ref 0–37)
Alkaline Phosphatase: 59 U/L (ref 39–117)
Bilirubin, Direct: 0.1 mg/dL (ref 0.0–0.3)
TOTAL PROTEIN: 6.4 g/dL (ref 6.0–8.3)
Total Bilirubin: 0.7 mg/dL (ref 0.2–1.2)

## 2015-01-18 LAB — BASIC METABOLIC PANEL
BUN: 11 mg/dL (ref 6–23)
CALCIUM: 10.3 mg/dL (ref 8.4–10.5)
CHLORIDE: 98 meq/L (ref 96–112)
CO2: 32 meq/L (ref 19–32)
Creatinine, Ser: 0.87 mg/dL (ref 0.40–1.20)
GFR: 66.12 mL/min (ref >60.00)
Glucose, Bld: 94 mg/dL (ref 70–99)
POTASSIUM: 4.4 meq/L (ref 3.5–5.1)
Sodium: 138 mEq/L (ref 135–145)

## 2015-01-18 LAB — HEMOGLOBIN A1C: HEMOGLOBIN A1C: 5.2 % (ref 4.6–6.5)

## 2015-01-18 NOTE — Telephone Encounter (Signed)
Orders placed for labs

## 2015-01-18 NOTE — Telephone Encounter (Signed)
Labs and dx?  

## 2015-01-22 ENCOUNTER — Encounter: Payer: Self-pay | Admitting: Internal Medicine

## 2015-01-22 ENCOUNTER — Ambulatory Visit (INDEPENDENT_AMBULATORY_CARE_PROVIDER_SITE_OTHER): Payer: Commercial Managed Care - HMO | Admitting: Internal Medicine

## 2015-01-22 VITALS — BP 128/70 | HR 64 | Temp 97.7°F | Resp 18 | Ht 60.0 in | Wt 132.0 lb

## 2015-01-22 DIAGNOSIS — E7800 Pure hypercholesterolemia, unspecified: Secondary | ICD-10-CM

## 2015-01-22 DIAGNOSIS — R634 Abnormal weight loss: Secondary | ICD-10-CM | POA: Insufficient documentation

## 2015-01-22 DIAGNOSIS — Z86010 Personal history of colonic polyps: Secondary | ICD-10-CM | POA: Diagnosis not present

## 2015-01-22 DIAGNOSIS — I10 Essential (primary) hypertension: Secondary | ICD-10-CM

## 2015-01-22 DIAGNOSIS — E2839 Other primary ovarian failure: Secondary | ICD-10-CM

## 2015-01-22 DIAGNOSIS — Z85820 Personal history of malignant melanoma of skin: Secondary | ICD-10-CM

## 2015-01-22 DIAGNOSIS — F439 Reaction to severe stress, unspecified: Secondary | ICD-10-CM

## 2015-01-22 DIAGNOSIS — R739 Hyperglycemia, unspecified: Secondary | ICD-10-CM

## 2015-01-22 DIAGNOSIS — Z658 Other specified problems related to psychosocial circumstances: Secondary | ICD-10-CM

## 2015-01-22 NOTE — Progress Notes (Signed)
Patient ID: Erin [name], female   DOB: May 16, 1930, 81 y.o.   MRN: [id number]   Subjective:    Patient ID: Erin [name], female    DOB: 14-Sep-1930, 81 y.o.   MRN: [id number]  HPI  Patient with past history of hypercholesterolemia and hypertension.  She comes in today to follow up on these issues.  She has lost weight.  Her husband recently passed.  See last note for details.  She states she is eating.  No nausea or vomiting.  No abdominal pain.  No diarrhea or bowel change.  States she just does not cook as much and does not eat as much as she used to.  Still eats regularly.  Is walking.  Stays active.  No cardiac symptoms with increased activity or exertion.  Has f/u with Dr Kellie Moor tomorrow.  Discussed cholesterol.  Has slightly improved.  She does not want to start prescription cholesterol medication.  Taking red yeast rice.  Wants to stop niacin.    Past Medical History  Diagnosis Date  . Hypertension   . Hypercholesterolemia   . History of colon polyps    Past Surgical History  Procedure Laterality Date  . Appendectomy  1946  . Laminectomy  2000  . Release of foraminal stenosis  2003  . Dilation and curettage of uterus     Family History  Problem Relation Age of Onset  . Breast cancer Maternal Aunt   . Hypertension Mother   . Hypertension Sister   . Heart disease Brother     S/P CABG  . Multiple sclerosis Sister     died 10   Social History   Social History  . Marital Status: Married    Spouse Name: N/A  . Number of Children: N/A  . Years of Education: N/A   Social History Main Topics  . Smoking status: Former Research scientist (life sciences)  . Smokeless tobacco: Never Used  . Alcohol Use: 0.0 oz/week    0 Standard drinks or equivalent per week     Comment: rare  . Drug Use: No  . Sexual Activity: Not Asked   Other Topics Concern  . None   Social History Narrative    Outpatient Encounter Prescriptions as of 01/22/2015  Medication Sig  . aspirin 81 MG tablet Take 81 mg by mouth  daily.  . benazepril (LOTENSIN) 40 MG tablet Take 1 tablet (40 mg total) by mouth 2 (two) times daily.  . Calcium Carbonate-Vitamin D (CALCIUM 600/VITAMIN D) 600-400 MG-UNIT per tablet Take 1 tablet by mouth daily.  . felodipine (PLENDIL) 2.5 MG 24 hr tablet Take 1 tablet (2.5 mg total) by mouth daily.  . furosemide (LASIX) 20 MG tablet Take 1 tablet (20 mg total) by mouth daily.  . Multiple Vitamin (MULTIVITAMIN) tablet Take 1 tablet by mouth daily.  . niacin 500 MG tablet Take 1,500 mg by mouth at bedtime.   . Omega-3 Fatty Acids (FISH OIL) 1000 MG CAPS Take by mouth daily.   No facility-administered encounter medications on file as of 01/22/2015.    Review of Systems  Constitutional:       Weight is decreased.  Eating.  Not eating as much.  No cooking as much.   HENT: Negative for congestion and sinus pressure.   Eyes: Negative for pain and discharge.  Respiratory: Negative for cough, chest tightness and shortness of breath.   Cardiovascular: Negative for chest pain, palpitations and leg swelling.  Gastrointestinal: Negative for nausea, vomiting, abdominal pain and diarrhea.  Genitourinary: Negative for dysuria and difficulty urinating.  Musculoskeletal: Negative for back pain and joint swelling.  Skin: Negative for color change and rash.  Neurological: Negative for dizziness, light-headedness and headaches.  Psychiatric/Behavioral: Negative for dysphoric mood and agitation.       Handling her husband's death well.  Has good support.        Objective:     Blood pressure rechecked by me:  140/72, pulse 76  Physical Exam  Constitutional: She appears well-developed and well-nourished. No distress.  HENT:  Nose: Nose normal.  Mouth/Throat: Oropharynx is clear and moist.  Eyes: Conjunctivae are normal. Right eye exhibits no discharge. Left eye exhibits no discharge.  Neck: Neck supple. No thyromegaly present.  Cardiovascular: Normal rate and regular rhythm.   Pulmonary/Chest:  Breath sounds normal. No respiratory distress. She has no wheezes.  Breast exam reveals no nipple discharge or nipple retraction present.  Could not appreciate any distinct nodules or axillary adenopathy.    Abdominal: Soft. Bowel sounds are normal. There is no tenderness.  Musculoskeletal: She exhibits no edema or tenderness.  Lymphadenopathy:    She has no cervical adenopathy.  Skin: No rash noted. No erythema.  Psychiatric: She has a normal mood and affect. Her behavior is normal.    BP 128/70 mmHg  Pulse 64  Temp(Src) 97.7 F (36.5 C) (Oral)  Resp 18  Ht 5' (1.524 m)  Wt 132 lb (59.875 kg)  BMI 25.78 kg/m2 Wt Readings from Last 3 Encounters:  01/22/15 132 lb (59.875 kg)  10/24/14 138 lb 12.8 oz (62.959 kg)  06/20/14 146 lb (66.225 kg)     Lab Results  Component Value Date   WBC 4.1 10/22/2014   HGB 14.5 10/22/2014   HCT 42.5 10/22/2014   PLT 239.0 10/22/2014   GLUCOSE 94 01/18/2015   CHOL 246* 01/18/2015   TRIG 88.0 01/18/2015   HDL 87.70 01/18/2015   LDLCALC 141* 01/18/2015   ALT 13 01/18/2015   AST 16 01/18/2015   NA 138 01/18/2015   K 4.4 01/18/2015   CL 98 01/18/2015   CREATININE 0.87 01/18/2015   BUN 11 01/18/2015   CO2 32 01/18/2015   TSH 0.95 10/22/2014   HGBA1C 5.2 01/18/2015       Assessment & Plan:   Problem List Items Addressed This Visit    History of colonic polyps    Colonoscopy 05/01/10 - six polyps removed.  GI felt no further colonoscopy warranted.        History of melanoma    Due to f/u with Dr Kellie Moor tomorrow.        Hypercholesterolemia    LDL improved some.  She declines prescription cholesterol medication.  Taking red yeast rice.  Wants to continue.  Wants to stop niacin.  Follow lipid panel.  Continue exercise.    Lab Results  Component Value Date   CHOL 246* 01/18/2015   HDL 87.70 01/18/2015   LDLCALC 141* 01/18/2015   TRIG 88.0 01/18/2015   CHOLHDL 3 01/18/2015        Hyperglycemia    Has lost weight.  Last a1c  5.2 (just checked).  Follow.        Hypertension    Blood pressure under good control.  Continue same medication regimen.  Follow pressures.  Follow metabolic panel.        Stress    She feels she is doing well.  Has good support.  Does not feel she needs any further intervention.  Follow.  Weight loss    Husband recently passed.  Not cooking as much.  No eating as much, but eating regularly.  No GI symptoms.  Follow.  Return in two months to f/u on her weight.         Other Visit Diagnoses    Estrogen deficiency    -  Primary    Relevant Orders    DG Bone Density        Einar Pheasant, MD

## 2015-01-22 NOTE — Assessment & Plan Note (Signed)
Blood pressure under good control.  Continue same medication regimen.  Follow pressures.  Follow metabolic panel.   

## 2015-01-22 NOTE — Assessment & Plan Note (Signed)
Husband recently passed.  Not cooking as much.  No eating as much, but eating regularly.  No GI symptoms.  Follow.  Return in two months to f/u on her weight.

## 2015-01-22 NOTE — Assessment & Plan Note (Signed)
Has lost weight.  Last a1c 5.2 (just checked).  Follow.

## 2015-01-22 NOTE — Assessment & Plan Note (Signed)
Due to f/u with Dr Kellie Moor tomorrow.

## 2015-01-22 NOTE — Assessment & Plan Note (Signed)
LDL improved some.  She declines prescription cholesterol medication.  Taking red yeast rice.  Wants to continue.  Wants to stop niacin.  Follow lipid panel.  Continue exercise.    Lab Results  Component Value Date   CHOL 246* 01/18/2015   HDL 87.70 01/18/2015   LDLCALC 141* 01/18/2015   TRIG 88.0 01/18/2015   CHOLHDL 3 01/18/2015

## 2015-01-22 NOTE — Assessment & Plan Note (Signed)
She feels she is doing well.  Has good support.  Does not feel she needs any further intervention.  Follow.

## 2015-01-22 NOTE — Progress Notes (Signed)
Pre-visit discussion using our clinic review tool. No additional management support is needed unless otherwise documented below in the visit note.  

## 2015-01-22 NOTE — Assessment & Plan Note (Signed)
Colonoscopy 05/01/10 - six polyps removed.  GI felt no further colonoscopy warranted.   

## 2015-02-13 LAB — HM DEXA SCAN: HM Dexa Scan: NORMAL

## 2015-02-15 ENCOUNTER — Encounter: Payer: Self-pay | Admitting: Internal Medicine

## 2015-02-20 ENCOUNTER — Encounter: Payer: Self-pay | Admitting: Internal Medicine

## 2015-03-05 ENCOUNTER — Other Ambulatory Visit: Payer: Self-pay | Admitting: Internal Medicine

## 2015-03-08 ENCOUNTER — Encounter: Payer: Self-pay | Admitting: Internal Medicine

## 2015-03-21 ENCOUNTER — Encounter: Payer: Self-pay | Admitting: *Deleted

## 2015-04-23 ENCOUNTER — Ambulatory Visit (INDEPENDENT_AMBULATORY_CARE_PROVIDER_SITE_OTHER): Payer: PPO

## 2015-04-23 VITALS — BP 118/70 | HR 74 | Temp 97.4°F | Resp 14 | Ht 60.0 in | Wt 129.8 lb

## 2015-04-23 DIAGNOSIS — Z0000 Encounter for general adult medical examination without abnormal findings: Secondary | ICD-10-CM | POA: Diagnosis not present

## 2015-04-23 NOTE — Patient Instructions (Addendum)
Erin [name],  Thank you for taking time to come for your Medicare Wellness Visit.  I appreciate your ongoing commitment to your health goals. Please review the following plan we discussed and let me know if I can assist you in the future.  Return in March for scheduled labs and appointment with Dr. Nicki Reaper.  Health Maintenance, Female Adopting a healthy lifestyle and getting preventive care can go a long way to promote health and wellness. Talk with your health care provider about what schedule of regular examinations is right for you. This is a good chance for you to check in with your provider about disease prevention and staying healthy. In between checkups, there are plenty of things you can do on your own. Experts have done a lot of research about which lifestyle changes and preventive measures are most likely to keep you healthy. Ask your health care provider for more information. WEIGHT AND DIET  Eat a healthy diet  Be sure to include plenty of vegetables, fruits, low-fat dairy products, and lean protein.  Do not eat a lot of foods high in solid fats, added sugars, or salt.  Get regular exercise. This is one of the most important things you can do for your health.  Most adults should exercise for at least 150 minutes each week. The exercise should increase your heart rate and make you sweat (moderate-intensity exercise).  Most adults should also do strengthening exercises at least twice a week. This is in addition to the moderate-intensity exercise.  Maintain a healthy weight  Body mass index (BMI) is a measurement that can be used to identify possible weight problems. It estimates body fat based on height and weight. Your health care provider can help determine your BMI and help you achieve or maintain a healthy weight.  For females 39 years of age and older:   A BMI below 18.5 is considered underweight.  A BMI of 18.5 to 24.9 is normal.  A BMI of 25 to 29.9 is considered  overweight.  A BMI of 30 and above is considered obese.  Watch levels of cholesterol and blood lipids  You should start having your blood tested for lipids and cholesterol at 80 years of age, then have this test every 5 years.  You may need to have your cholesterol levels checked more often if:  Your lipid or cholesterol levels are high.  You are older than 80 years of age.  You are at high risk for heart disease.  CANCER SCREENING   Lung Cancer  Lung cancer screening is recommended for adults 104-52 years old who are at high risk for lung cancer because of a history of smoking.  A yearly low-dose CT scan of the lungs is recommended for people who:  Currently smoke.  Have quit within the past 15 years.  Have at least a 30-pack-year history of smoking. A pack year is smoking an average of one pack of cigarettes a day for 1 year.  Yearly screening should continue until it has been 15 years since you quit.  Yearly screening should stop if you develop a health problem that would prevent you from having lung cancer treatment.  Breast Cancer  Practice breast self-awareness. This means understanding how your breasts normally appear and feel.  It also means doing regular breast self-exams. Let your health care provider know about any changes, no matter how small.  If you are in your 20s or 30s, you should have a clinical breast exam (CBE) by  a health care provider every 1-3 years as part of a regular health exam.  If you are 50 or older, have a CBE every year. Also consider having a breast X-ray (mammogram) every year.  If you have a family history of breast cancer, talk to your health care provider about genetic screening.  If you are at high risk for breast cancer, talk to your health care provider about having an MRI and a mammogram every year.  Breast cancer gene (BRCA) assessment is recommended for women who have family members with BRCA-related cancers. BRCA-related  cancers include:  Breast.  Ovarian.  Tubal.  Peritoneal cancers.  Results of the assessment will determine the need for genetic counseling and BRCA1 and BRCA2 testing. Cervical Cancer Your health care provider may recommend that you be screened regularly for cancer of the pelvic organs (ovaries, uterus, and vagina). This screening involves a pelvic examination, including checking for microscopic changes to the surface of your cervix (Pap test). You may be encouraged to have this screening done every 3 years, beginning at age 42.  For women ages 92-65, health care providers may recommend pelvic exams and Pap testing every 3 years, or they may recommend the Pap and pelvic exam, combined with testing for human papilloma virus (HPV), every 5 years. Some types of HPV increase your risk of cervical cancer. Testing for HPV may also be done on women of any age with unclear Pap test results.  Other health care providers may not recommend any screening for nonpregnant women who are considered low risk for pelvic cancer and who do not have symptoms. Ask your health care provider if a screening pelvic exam is right for you.  If you have had past treatment for cervical cancer or a condition that could lead to cancer, you need Pap tests and screening for cancer for at least 20 years after your treatment. If Pap tests have been discontinued, your risk factors (such as having a new sexual partner) need to be reassessed to determine if screening should resume. Some women have medical problems that increase the chance of getting cervical cancer. In these cases, your health care provider may recommend more frequent screening and Pap tests. Colorectal Cancer  This type of cancer can be detected and often prevented.  Routine colorectal cancer screening usually begins at 80 years of age and continues through 80 years of age.  Your health care provider may recommend screening at an earlier age if you have risk  factors for colon cancer.  Your health care provider may also recommend using home test kits to check for hidden blood in the stool.  A small camera at the end of a tube can be used to examine your colon directly (sigmoidoscopy or colonoscopy). This is done to check for the earliest forms of colorectal cancer.  Routine screening usually begins at age 16.  Direct examination of the colon should be repeated every 5-10 years through 80 years of age. However, you may need to be screened more often if early forms of precancerous polyps or small growths are found. Skin Cancer  Check your skin from head to toe regularly.  Tell your health care provider about any new moles or changes in moles, especially if there is a change in a mole's shape or color.  Also tell your health care provider if you have a mole that is larger than the size of a pencil eraser.  Always use sunscreen. Apply sunscreen liberally and repeatedly throughout the day.  Protect yourself by wearing long sleeves, pants, a wide-brimmed hat, and sunglasses whenever you are outside. HEART DISEASE, DIABETES, AND HIGH BLOOD PRESSURE   High blood pressure causes heart disease and increases the risk of stroke. High blood pressure is more likely to develop in:  People who have blood pressure in the high end of the normal range (130-139/85-89 mm Hg).  People who are overweight or obese.  People who are African American.  If you are 29-4 years of age, have your blood pressure checked every 3-5 years. If you are 42 years of age or older, have your blood pressure checked every year. You should have your blood pressure measured twice--once when you are at a hospital or clinic, and once when you are not at a hospital or clinic. Record the average of the two measurements. To check your blood pressure when you are not at a hospital or clinic, you can use:  An automated blood pressure machine at a pharmacy.  A home blood pressure  monitor.  If you are between 27 years and 61 years old, ask your health care provider if you should take aspirin to prevent strokes.  Have regular diabetes screenings. This involves taking a blood sample to check your fasting blood sugar level.  If you are at a normal weight and have a low risk for diabetes, have this test once every three years after 80 years of age.  If you are overweight and have a high risk for diabetes, consider being tested at a younger age or more often. PREVENTING INFECTION  Hepatitis B  If you have a higher risk for hepatitis B, you should be screened for this virus. You are considered at high risk for hepatitis B if:  You were born in a country where hepatitis B is common. Ask your health care provider which countries are considered high risk.  Your parents were born in a high-risk country, and you have not been immunized against hepatitis B (hepatitis B vaccine).  You have HIV or AIDS.  You use needles to inject street drugs.  You live with someone who has hepatitis B.  You have had sex with someone who has hepatitis B.  You get hemodialysis treatment.  You take certain medicines for conditions, including cancer, organ transplantation, and autoimmune conditions. Hepatitis C  Blood testing is recommended for:  Everyone born from [birth year] through 1963.  Anyone with known risk factors for hepatitis C. Sexually transmitted infections (STIs)  You should be screened for sexually transmitted infections (STIs) including gonorrhea and chlamydia if:  You are sexually active and are younger than 80 years of age.  You are older than 80 years of age and your health care provider tells you that you are at risk for this type of infection.  Your sexual activity has changed since you were last screened and you are at an increased risk for chlamydia or gonorrhea. Ask your health care provider if you are at risk.  If you do not have HIV, but are at risk, it may be  recommended that you take a prescription medicine daily to prevent HIV infection. This is called pre-exposure prophylaxis (PrEP). You are considered at risk if:  You are sexually active and do not regularly use condoms or know the HIV status of your partner(s).  You take drugs by injection.  You are sexually active with a partner who has HIV. Talk with your health care provider about whether you are at high risk of being infected  with HIV. If you choose to begin PrEP, you should first be tested for HIV. You should then be tested every 3 months for as long as you are taking PrEP.  PREGNANCY   If you are premenopausal and you may become pregnant, ask your health care provider about preconception counseling.  If you may become pregnant, take 400 to 800 micrograms (mcg) of folic acid every day.  If you want to prevent pregnancy, talk to your health care provider about birth control (contraception). OSTEOPOROSIS AND MENOPAUSE   Osteoporosis is a disease in which the bones lose minerals and strength with aging. This can result in serious bone fractures. Your risk for osteoporosis can be identified using a bone density scan.  If you are 30 years of age or older, or if you are at risk for osteoporosis and fractures, ask your health care provider if you should be screened.  Ask your health care provider whether you should take a calcium or vitamin D supplement to lower your risk for osteoporosis.  Menopause may have certain physical symptoms and risks.  Hormone replacement therapy may reduce some of these symptoms and risks. Talk to your health care provider about whether hormone replacement therapy is right for you.  HOME CARE INSTRUCTIONS   Schedule regular health, dental, and eye exams.  Stay current with your immunizations.   Do not use any tobacco products including cigarettes, chewing tobacco, or electronic cigarettes.  If you are pregnant, do not drink alcohol.  If you are  breastfeeding, limit how much and how often you drink alcohol.  Limit alcohol intake to no more than 1 drink per day for nonpregnant women. One drink equals 12 ounces of beer, 5 ounces of wine, or 1 ounces of hard liquor.  Do not use street drugs.  Do not share needles.  Ask your health care provider for help if you need support or information about quitting drugs.  Tell your health care provider if you often feel depressed.  Tell your health care provider if you have ever been abused or do not feel safe at home.   This information is not intended to replace advice given to you by your health care provider. Make sure you discuss any questions you have with your health care provider.   Document Released: 09/15/2010 Document Revised: 03/23/2014 Document Reviewed: 02/01/2013 Elsevier Interactive Patient Education [employer].

## 2015-04-23 NOTE — Progress Notes (Signed)
Subjective:   Erin [name] is a 82 y.o. female who presents for an Initial Medicare Annual Wellness Visit.  Review of Systems    No ROS.  Medicare Wellness Visit.  Cardiac Risk Factors include: advanced age (>41men, >45 women);hypertension     Objective:    Today's Vitals   04/23/15 0915  BP: 118/70  Pulse: 74  Temp: 97.4 F (36.3 C)  TempSrc: Oral  Resp: 14  Height: 5' (1.524 m)  Weight: 129 lb 12.8 oz (58.877 kg)  SpO2: 97%    Current Medications (verified) Outpatient Encounter Prescriptions as of 04/23/2015  Medication Sig  . aspirin 81 MG tablet Take 81 mg by mouth daily.  . benazepril (LOTENSIN) 40 MG tablet Take 1 tablet (40 mg total) by mouth 2 (two) times daily.  . Calcium Carbonate-Vitamin D (CALCIUM 600/VITAMIN D) 600-400 MG-UNIT per tablet Take 1 tablet by mouth daily.  . felodipine (PLENDIL) 2.5 MG 24 hr tablet Take 1 tablet (2.5 mg total) by mouth daily.  . furosemide (LASIX) 20 MG tablet Take 1 tablet (20 mg total) by mouth daily.  . Multiple Vitamin (MULTIVITAMIN) tablet Take 1 tablet by mouth daily.  . niacin 500 MG tablet Take 1,500 mg by mouth at bedtime.   . Omega-3 Fatty Acids (FISH OIL) 1000 MG CAPS Take by mouth daily.  . Red Yeast Rice Extract (RED YEAST RICE PO) Take 2 tablets by mouth.   No facility-administered encounter medications on file as of 04/23/2015.    Allergies (verified) Erythromycin and Minocin   History: Past Medical History  Diagnosis Date  . Hypertension   . Hypercholesterolemia   . History of colon polyps    Past Surgical History  Procedure Laterality Date  . Appendectomy  1946  . Laminectomy  2000  . Release of foraminal stenosis  2003  . Dilation and curettage of uterus     Family History  Problem Relation Age of Onset  . Breast cancer Maternal Aunt   . Hypertension Mother   . Hypertension Sister   . Heart disease Brother     S/P CABG  . Multiple sclerosis Sister     died 66   Social History    Occupational History  . Not on file.   Social History Main Topics  . Smoking status: Former Research scientist (life sciences)  . Smokeless tobacco: Never Used  . Alcohol Use: 0.0 oz/week    0 Standard drinks or equivalent per week     Comment: rare  . Drug Use: No  . Sexual Activity: Not on file    Tobacco Counseling Counseling given: Not Answered   Activities of Daily Living In your present state of health, do you have any difficulty performing the following activities: 04/23/2015  Hearing? N  Vision? N  Difficulty concentrating or making decisions? N  Walking or climbing stairs? N  Dressing or bathing? N  Doing errands, shopping? N  Preparing Food and eating ? N  Using the Toilet? N  In the past six months, have you accidently leaked urine? N  Do you have problems with loss of bowel control? N  Managing your Medications? N  Managing your Finances? N  Housekeeping or managing your Housekeeping? N    Immunizations and Health Maintenance Immunization History  Administered Date(s) Administered  . Influenza Split 12/05/2013  . Influenza,inj,Quad PF,36+ Mos 11/30/2012  . Influenza-Unspecified 11/14/2013, 12/11/2014  . Pneumococcal Conjugate-13 10/24/2014  . Tdap 05/31/2012  . Zoster 02/14/2011   Health Maintenance Due  Topic  Date Due  . MAMMOGRAM  12/29/2014    Patient Care Team: Einar Pheasant, MD as PCP - General (Internal Medicine)  Indicate any recent Medical Services you may have received from other than [facility] providers in the past year (date may be approximate).     Assessment:   This is a routine wellness examination for Erin [name]. The goal of the wellness visit is to assist the patient how to close the gaps in care and create a preventative care plan for the patient.   Taking VIT D Calcium as appropriate/ Osteoporosis risk reviewed.  Medications reviewed; taking without issues or barriers.  Safety issues reviewed; smoke detectors in the home. No firearms in the home. Wears  seatbelts when driving or riding with others. No violence in the home.  No identified risk were noted; The patient was oriented x 3; appropriate in dress and manner and no objective failures at ADL's or IADL's.   Neutropenia NOS-stable and followed by Dr. Nicki Reaper  Patient Concerns: Mammogram due.  She requests to wait and discuss with PCP at upcoming follow up before scheduling.  Last completed April 2016.    Hearing/Vision screen Hearing Screening Comments: Some difficulty hearing She states she will try hearing aids again Vision Screening Comments: Follow[facility], Dr. Jeni Salles Wears reading glasses Annual visits Bilateral cataracts extracted   Dietary issues and exercise activities discussed: Current Exercise Habits:: Home exercise routine, Type of exercise: walking, Time (Minutes): 45, Intensity: Moderate  Goals    . Healthy Lifestyle     Maintain exercise regiment Stay hydrated! Drink plenty of fluids Choose lean meats (chicken, [place], fish), fruits and vegetables      Depression Screen PHQ 2/9 Scores 04/23/2015 01/22/2015 10/24/2014 06/28/2013 05/31/2012  PHQ - 2 Score 0 0 0 0 0    Fall Risk Fall Risk  04/23/2015 01/22/2015 10/24/2014 06/28/2013 05/31/2012  Falls in the past year? No No No No No    Cognitive Function: MMSE - Mini Mental State Exam 04/23/2015  Orientation to time 5  Orientation to Place 5  Registration 3  Attention/ Calculation 5  Recall 3  Language- name 2 objects 2  Language- repeat 1  Language- follow 3 step command 3  Language- read & follow direction 1  Write a sentence 1  Copy design 1  Total score 30    Screening Tests Health Maintenance  Topic Date Due  . MAMMOGRAM  12/29/2014  . INFLUENZA VACCINE  10/15/2015  . PNA vac Low Risk Adult (2 of 2 - PPSV23) 10/24/2015  . TETANUS/TDAP  06/01/2022  . DEXA SCAN  Completed  . ZOSTAVAX  Addressed      Plan:   End of life planning; Advance aging; Advanced directives discussed.  Copy of current HCPOA/Living Will requested.    During the course of the visit, Katryn was educated and counseled about the following appropriate screening and preventive services:   Vaccines to include Pneumoccal, Influenza, Hepatitis B, Td, Zostavax, HCV  Electrocardiogram  Cardiovascular disease screening  Colorectal cancer screening  Bone density screening  Diabetes screening  Glaucoma screening  Mammography/PAP  Nutrition counseling  Smoking cessation counseling  Patient Instructions (the written plan) were given to the patient.    Varney Biles, LPN   [phone]    Reviewed above information.  Agree with plan.   Dr Nicki Reaper

## 2015-04-25 ENCOUNTER — Ambulatory Visit: Payer: Commercial Managed Care - HMO | Admitting: Internal Medicine

## 2015-05-24 ENCOUNTER — Telehealth: Payer: Self-pay | Admitting: *Deleted

## 2015-05-24 DIAGNOSIS — R739 Hyperglycemia, unspecified: Secondary | ICD-10-CM

## 2015-05-24 DIAGNOSIS — E7800 Pure hypercholesterolemia, unspecified: Secondary | ICD-10-CM

## 2015-05-24 DIAGNOSIS — I10 Essential (primary) hypertension: Secondary | ICD-10-CM

## 2015-05-24 DIAGNOSIS — Z85820 Personal history of malignant melanoma of skin: Secondary | ICD-10-CM

## 2015-05-24 NOTE — Telephone Encounter (Signed)
Pt coming in for labs on Monday. Need orders placed

## 2015-05-25 NOTE — Telephone Encounter (Signed)
Order placed for labs.

## 2015-05-27 ENCOUNTER — Other Ambulatory Visit (INDEPENDENT_AMBULATORY_CARE_PROVIDER_SITE_OTHER): Payer: PPO

## 2015-05-27 DIAGNOSIS — R739 Hyperglycemia, unspecified: Secondary | ICD-10-CM

## 2015-05-27 DIAGNOSIS — E7800 Pure hypercholesterolemia, unspecified: Secondary | ICD-10-CM

## 2015-05-27 LAB — BASIC METABOLIC PANEL
BUN: 15 mg/dL (ref 6–23)
CHLORIDE: 100 meq/L (ref 96–112)
CO2: 30 meq/L (ref 19–32)
CREATININE: 0.84 mg/dL (ref 0.40–1.20)
Calcium: 9.9 mg/dL (ref 8.4–10.5)
GFR: 68.79 mL/min (ref >60.00)
Glucose, Bld: 96 mg/dL (ref 70–99)
POTASSIUM: 4.1 meq/L (ref 3.5–5.1)
Sodium: 137 mEq/L (ref 135–145)

## 2015-05-27 LAB — HEPATIC FUNCTION PANEL
ALK PHOS: 53 U/L (ref 39–117)
ALT: 12 U/L (ref 0–35)
AST: 16 U/L (ref 0–37)
Albumin: 4.2 g/dL (ref 3.5–5.2)
BILIRUBIN DIRECT: 0.1 mg/dL (ref 0.0–0.3)
BILIRUBIN TOTAL: 0.6 mg/dL (ref 0.2–1.2)
Total Protein: 6.6 g/dL (ref 6.0–8.3)

## 2015-05-27 LAB — LIPID PANEL
CHOL/HDL RATIO: 3
Cholesterol: 242 mg/dL — ABNORMAL HIGH (ref 0–200)
HDL: 91 mg/dL (ref >39.00)
LDL Cholesterol: 133 mg/dL — ABNORMAL HIGH (ref 0–99)
NONHDL: 151.15
Triglycerides: 93 mg/dL (ref 0.0–149.0)
VLDL: 18.6 mg/dL (ref 0.0–40.0)

## 2015-05-27 LAB — HEMOGLOBIN A1C: Hgb A1c MFr Bld: 5.5 % (ref 4.6–6.5)

## 2015-05-31 ENCOUNTER — Encounter: Payer: Self-pay | Admitting: Internal Medicine

## 2015-05-31 ENCOUNTER — Ambulatory Visit (INDEPENDENT_AMBULATORY_CARE_PROVIDER_SITE_OTHER): Payer: PPO | Admitting: Internal Medicine

## 2015-05-31 VITALS — BP 122/62 | HR 64 | Temp 97.4°F | Resp 12 | Ht 60.0 in | Wt 131.8 lb

## 2015-05-31 DIAGNOSIS — F439 Reaction to severe stress, unspecified: Secondary | ICD-10-CM

## 2015-05-31 DIAGNOSIS — R928 Other abnormal and inconclusive findings on diagnostic imaging of breast: Secondary | ICD-10-CM

## 2015-05-31 DIAGNOSIS — R634 Abnormal weight loss: Secondary | ICD-10-CM

## 2015-05-31 DIAGNOSIS — I10 Essential (primary) hypertension: Secondary | ICD-10-CM | POA: Diagnosis not present

## 2015-05-31 DIAGNOSIS — E7800 Pure hypercholesterolemia, unspecified: Secondary | ICD-10-CM

## 2015-05-31 DIAGNOSIS — R200 Anesthesia of skin: Secondary | ICD-10-CM

## 2015-05-31 DIAGNOSIS — Z658 Other specified problems related to psychosocial circumstances: Secondary | ICD-10-CM | POA: Diagnosis not present

## 2015-05-31 DIAGNOSIS — R208 Other disturbances of skin sensation: Secondary | ICD-10-CM

## 2015-05-31 DIAGNOSIS — R739 Hyperglycemia, unspecified: Secondary | ICD-10-CM

## 2015-05-31 NOTE — Progress Notes (Signed)
Pre visit review using our clinic review tool, if applicable. No additional management support is needed unless otherwise documented below in the visit note. 

## 2015-05-31 NOTE — Progress Notes (Signed)
Patient ID: Erin [name], female   DOB: 20-Jun-1930, 82 y.o.   MRN: [id number]   Subjective:    Patient ID: Erin [name], female    DOB: June 26, 1930, 82 y.o.   MRN: [id number]  HPI  Patient here for a scheduled follow up.  She reports she is doing relatively well.  Handling stress with her husband's passing.  Does not feel she needs anything more at this point.  Still walks.  No cardiac symptoms with increased activity or exertion.  No sob.  No acid reflux.  No abdominal pain or cramping.  Bowels stable.  Does report intermittent left arm numbness.  Has been present for the last 3-4 weeks.  May occur 1-2x/week.  No other neuro symptoms.  States no triggers.  Can occur at any time.  Only brief episodes.  No weakness.     Past Medical History  Diagnosis Date  . Hypertension   . Hypercholesterolemia   . History of colon polyps    Past Surgical History  Procedure Laterality Date  . Appendectomy  1946  . Laminectomy  2000  . Release of foraminal stenosis  2003  . Dilation and curettage of uterus     Family History  Problem Relation Age of Onset  . Breast cancer Maternal Aunt   . Hypertension Mother   . Hypertension Sister   . Heart disease Brother     S/P CABG  . Multiple sclerosis Sister     died 48   Social History   Social History  . Marital Status: Married    Spouse Name: N/A  . Number of Children: N/A  . Years of Education: N/A   Social History Main Topics  . Smoking status: Former Research scientist (life sciences)  . Smokeless tobacco: Never Used  . Alcohol Use: 0.0 oz/week    0 Standard drinks or equivalent per week     Comment: rare  . Drug Use: No  . Sexual Activity: Not Asked   Other Topics Concern  . None   Social History Narrative    Outpatient Encounter Prescriptions as of 05/31/2015  Medication Sig  . aspirin 81 MG tablet Take 81 mg by mouth daily.  . benazepril (LOTENSIN) 40 MG tablet Take 1 tablet (40 mg total) by mouth 2 (two) times daily.  . Calcium Carbonate-Vitamin D  (CALCIUM 600/VITAMIN D) 600-400 MG-UNIT per tablet Take 1 tablet by mouth daily.  . felodipine (PLENDIL) 2.5 MG 24 hr tablet Take 1 tablet (2.5 mg total) by mouth daily.  . furosemide (LASIX) 20 MG tablet Take 1 tablet (20 mg total) by mouth daily.  . Multiple Vitamin (MULTIVITAMIN) tablet Take 1 tablet by mouth daily.  . niacin 500 MG tablet Take 1,500 mg by mouth at bedtime.   . Omega-3 Fatty Acids (FISH OIL) 1000 MG CAPS Take by mouth daily.  . Red Yeast Rice Extract (RED YEAST RICE PO) Take 2 tablets by mouth.   No facility-administered encounter medications on file as of 05/31/2015.    Review of Systems  Constitutional: Negative for appetite change and unexpected weight change.  HENT: Negative for congestion and sinus pressure.   Respiratory: Negative for cough, chest tightness and shortness of breath.   Cardiovascular: Negative for chest pain, palpitations and leg swelling.  Gastrointestinal: Negative for nausea, vomiting, abdominal pain and diarrhea.  Genitourinary: Negative for dysuria and difficulty urinating.  Musculoskeletal: Negative for back pain.       No neck pain or stiffness.  Left arm numbness as outlined.  Skin: Negative for color change and rash.  Neurological: Negative for dizziness, light-headedness and headaches.  Psychiatric/Behavioral: Negative for dysphoric mood and agitation.       Objective:    Physical Exam  Constitutional: She appears well-developed and well-nourished. No distress.  HENT:  Nose: Nose normal.  Mouth/Throat: Oropharynx is clear and moist.  Eyes: Conjunctivae are normal. Right eye exhibits no discharge. Left eye exhibits no discharge.  Neck: Neck supple. No thyromegaly present.  Cardiovascular: Normal rate and regular rhythm.   Pulmonary/Chest: Breath sounds normal. No respiratory distress. She has no wheezes.  Abdominal: Soft. Bowel sounds are normal. There is no tenderness.  Musculoskeletal: She exhibits no edema or tenderness.    Grip strength normal.  Motor strength equal bilateral upper extremities.    Lymphadenopathy:    She has no cervical adenopathy.  Skin: No rash noted. No erythema.  Psychiatric: She has a normal mood and affect. Her behavior is normal.    BP 122/62 mmHg  Pulse 64  Temp(Src) 97.4 F (36.3 C) (Oral)  Resp 12  Ht 5' (1.524 m)  Wt 131 lb 12.8 oz (59.784 kg)  BMI 25.74 kg/m2  SpO2 97% Wt Readings from Last 3 Encounters:  05/31/15 131 lb 12.8 oz (59.784 kg)  04/23/15 129 lb 12.8 oz (58.877 kg)  01/22/15 132 lb (59.875 kg)     Lab Results  Component Value Date   WBC 4.1 10/22/2014   HGB 14.5 10/22/2014   HCT 42.5 10/22/2014   PLT 239.0 10/22/2014   GLUCOSE 96 05/27/2015   CHOL 242* 05/27/2015   TRIG 93.0 05/27/2015   HDL 91.00 05/27/2015   LDLCALC 133* 05/27/2015   ALT 12 05/27/2015   AST 16 05/27/2015   NA 137 05/27/2015   K 4.1 05/27/2015   CL 100 05/27/2015   CREATININE 0.84 05/27/2015   BUN 15 05/27/2015   CO2 30 05/27/2015   TSH 0.95 10/22/2014   HGBA1C 5.5 05/27/2015        Assessment & Plan:   Problem List Items Addressed This Visit    Abnormal mammogram    Mammogram 06/29/14 - Birads I.        Hypercholesterolemia    Low cholesterol diet and exercise.  Follow lipid panel and liver function tests.  LDL on recent check 133.  Discussed starting cholesterol medication.  She declines.  Follow.        Hyperglycemia    Low carb diet and exercise.  Follow met b and a1c.        Hypertension    Blood pressure under good control.  Continue same medication regimen.  Follow pressures.  Follow metabolic panel.        Left arm numbness - Primary    Persistent intermittent left arm numbness as outlined.  Unclear etiology.  No other neuro symptoms.  Does not appear to be positional.  No neck issues.  Schedule nerve conduction studies.  Discussed further w/up, including MRI and carotid ultrasound.  Start with NCS.  Continue daily aspirin.  Follow.        Relevant  Orders   Ambulatory referral to Neurology   Stress    Discussed with her today.  Feels she is handling things relatively well.  Does not feel needs anything more at this point.  Follow.        Weight loss    Eating better.  No further weight loss.  Follow.            Umar Patmon,  Randell Patient, MD

## 2015-06-02 ENCOUNTER — Encounter: Payer: Self-pay | Admitting: Internal Medicine

## 2015-06-02 DIAGNOSIS — R200 Anesthesia of skin: Secondary | ICD-10-CM | POA: Insufficient documentation

## 2015-06-02 NOTE — Assessment & Plan Note (Signed)
Low carb diet and exercise.  Follow met b and a1c.   

## 2015-06-02 NOTE — Assessment & Plan Note (Signed)
Low cholesterol diet and exercise.  Follow lipid panel and liver function tests.  LDL on recent check 133.  Discussed starting cholesterol medication.  She declines.  Follow.

## 2015-06-02 NOTE — Assessment & Plan Note (Signed)
Persistent intermittent left arm numbness as outlined.  Unclear etiology.  No other neuro symptoms.  Does not appear to be positional.  No neck issues.  Schedule nerve conduction studies.  Discussed further w/up, including MRI and carotid ultrasound.  Start with NCS.  Continue daily aspirin.  Follow.

## 2015-06-02 NOTE — Assessment & Plan Note (Signed)
Blood pressure under good control.  Continue same medication regimen.  Follow pressures.  Follow metabolic panel.   

## 2015-06-02 NOTE — Assessment & Plan Note (Signed)
Eating better.  No further weight loss.  Follow.

## 2015-06-02 NOTE — Assessment & Plan Note (Signed)
Discussed with her today.  Feels she is handling things relatively well.  Does not feel needs anything more at this point.  Follow.

## 2015-06-02 NOTE — Assessment & Plan Note (Signed)
Mammogram 06/29/14 - Birads I.

## 2015-06-10 DIAGNOSIS — G5602 Carpal tunnel syndrome, left upper limb: Secondary | ICD-10-CM | POA: Diagnosis not present

## 2015-06-14 ENCOUNTER — Telehealth: Payer: Self-pay

## 2015-06-14 NOTE — Telephone Encounter (Signed)
Pt states that she has a cold with a cough and that she want to see if she gets better over the weekend and call if not better by Monday.

## 2015-07-09 ENCOUNTER — Other Ambulatory Visit: Payer: Self-pay | Admitting: Internal Medicine

## 2015-07-15 DIAGNOSIS — H903 Sensorineural hearing loss, bilateral: Secondary | ICD-10-CM | POA: Diagnosis not present

## 2015-07-15 DIAGNOSIS — H6123 Impacted cerumen, bilateral: Secondary | ICD-10-CM | POA: Diagnosis not present

## 2015-07-25 ENCOUNTER — Ambulatory Visit (INDEPENDENT_AMBULATORY_CARE_PROVIDER_SITE_OTHER): Payer: PPO | Admitting: Internal Medicine

## 2015-07-25 ENCOUNTER — Encounter: Payer: Self-pay | Admitting: Internal Medicine

## 2015-07-25 VITALS — BP 100/64 | HR 68 | Temp 97.7°F | Resp 18 | Ht 60.0 in | Wt 130.5 lb

## 2015-07-25 DIAGNOSIS — R05 Cough: Secondary | ICD-10-CM

## 2015-07-25 DIAGNOSIS — I10 Essential (primary) hypertension: Secondary | ICD-10-CM

## 2015-07-25 DIAGNOSIS — R928 Other abnormal and inconclusive findings on diagnostic imaging of breast: Secondary | ICD-10-CM

## 2015-07-25 DIAGNOSIS — R634 Abnormal weight loss: Secondary | ICD-10-CM | POA: Diagnosis not present

## 2015-07-25 DIAGNOSIS — R208 Other disturbances of skin sensation: Secondary | ICD-10-CM

## 2015-07-25 DIAGNOSIS — R739 Hyperglycemia, unspecified: Secondary | ICD-10-CM

## 2015-07-25 DIAGNOSIS — F439 Reaction to severe stress, unspecified: Secondary | ICD-10-CM

## 2015-07-25 DIAGNOSIS — R059 Cough, unspecified: Secondary | ICD-10-CM

## 2015-07-25 DIAGNOSIS — Z658 Other specified problems related to psychosocial circumstances: Secondary | ICD-10-CM

## 2015-07-25 DIAGNOSIS — E7800 Pure hypercholesterolemia, unspecified: Secondary | ICD-10-CM

## 2015-07-25 DIAGNOSIS — R200 Anesthesia of skin: Secondary | ICD-10-CM

## 2015-07-25 MED ORDER — FUROSEMIDE 20 MG PO TABS: 20.0000 mg | ORAL_TABLET | Freq: Every day | ORAL | Status: DC

## 2015-07-25 MED ORDER — BENAZEPRIL HCL 40 MG PO TABS: ORAL_TABLET | ORAL | Status: DC

## 2015-07-25 NOTE — Progress Notes (Signed)
Pre-visit discussion using our clinic review tool. No additional management support is needed unless otherwise documented below in the visit note.  

## 2015-07-25 NOTE — Progress Notes (Signed)
Patient ID: Erin [name], female   DOB: 1931/02/14, 82 y.o.   MRN: [id number]   Subjective:    Patient ID: Erin [name], female    DOB: [birth year], 82 y.o.   MRN: [id number]  HPI  Patient here for a scheduled follow up.  She reports she is doing well.  Stays active.  Walks daily.  No cardiac symptoms with increased activity or exertion.  No sob.  No acid reflux.  No abdominal pain or cramping.  Had increased cough and congestion.  Cough persisted.  Request cxr.  Bowels stable.  Handling stress well.     Past Medical History  Diagnosis Date  . Hypertension   . Hypercholesterolemia   . History of colon polyps    Past Surgical History  Procedure Laterality Date  . Appendectomy  1946  . Laminectomy  2000  . Release of foraminal stenosis  2003  . Dilation and curettage of uterus     Family History  Problem Relation Age of Onset  . Breast cancer Maternal Aunt   . Hypertension Mother   . Hypertension Sister   . Heart disease Brother     S/P CABG  . Multiple sclerosis Sister     died 66   Social History   Social History  . Marital Status: Married    Spouse Name: N/A  . Number of Children: N/A  . Years of Education: N/A   Social History Main Topics  . Smoking status: Former Research scientist (life sciences)  . Smokeless tobacco: Never Used  . Alcohol Use: 0.0 oz/week    0 Standard drinks or equivalent per week     Comment: rare  . Drug Use: No  . Sexual Activity: Not Asked   Other Topics Concern  . None   Social History Narrative    Outpatient Encounter Prescriptions as of 07/25/2015  Medication Sig  . aspirin 81 MG tablet Take 81 mg by mouth daily.  . benazepril (LOTENSIN) 40 MG tablet Take 1 tablet (40 mg total) by mouth 2 (two) times daily.  . Calcium Carbonate-Vitamin D (CALCIUM 600/VITAMIN D) 600-400 MG-UNIT per tablet Take 1 tablet by mouth daily.  . felodipine (PLENDIL) 2.5 MG 24 hr tablet Take 1 tablet (2.5 mg total) by mouth daily.  . furosemide (LASIX) 20 MG tablet Take 1 tablet  (20 mg total) by mouth daily.  . Multiple Vitamin (MULTIVITAMIN) tablet Take 1 tablet by mouth daily.  . niacin 500 MG tablet Take 1,500 mg by mouth at bedtime.   . Omega-3 Fatty Acids (FISH OIL) 1000 MG CAPS Take by mouth daily.  . Red Yeast Rice Extract (RED YEAST RICE PO) Take 2 tablets by mouth.  . [DISCONTINUED] benazepril (LOTENSIN) 40 MG tablet Take 1 tablet (40 mg total) by mouth 2 (two) times daily.  . [DISCONTINUED] furosemide (LASIX) 20 MG tablet Take 1 tablet (20 mg total) by mouth daily.   No facility-administered encounter medications on file as of 07/25/2015.    Review of Systems  Constitutional: Negative for appetite change and unexpected weight change.  HENT: Negative for congestion and sinus pressure.   Respiratory: Positive for cough (previous cough. ). Negative for chest tightness and shortness of breath.   Cardiovascular: Negative for chest pain, palpitations and leg swelling.  Gastrointestinal: Negative for nausea, vomiting, abdominal pain and diarrhea.  Genitourinary: Negative for dysuria and difficulty urinating.  Musculoskeletal: Negative for back pain and joint swelling.  Skin: Negative for color change and rash.  Neurological: Negative for dizziness, light-headedness  and headaches.  Psychiatric/Behavioral: Negative for dysphoric mood and agitation.       Objective:    Physical Exam  Constitutional: She appears well-developed and well-nourished. No distress.  HENT:  Nose: Nose normal.  Mouth/Throat: Oropharynx is clear and moist.  Neck: Neck supple. No thyromegaly present.  Cardiovascular: Normal rate and regular rhythm.   Pulmonary/Chest: Breath sounds normal. No respiratory distress. She has no wheezes.  Abdominal: Soft. Bowel sounds are normal. There is no tenderness.  Musculoskeletal: She exhibits no edema or tenderness.  Lymphadenopathy:    She has no cervical adenopathy.  Skin: No rash noted. No erythema.  Psychiatric: She has a normal mood and  affect. Her behavior is normal.    BP 100/64 mmHg  Pulse 68  Temp(Src) 97.7 F (36.5 C) (Oral)  Resp 18  Ht 5' (1.524 m)  Wt 130 lb 8 oz (59.194 kg)  BMI 25.49 kg/m2  SpO2 97% Wt Readings from Last 3 Encounters:  07/25/15 130 lb 8 oz (59.194 kg)  05/31/15 131 lb 12.8 oz (59.784 kg)  04/23/15 129 lb 12.8 oz (58.877 kg)     Lab Results  Component Value Date   WBC 4.1 10/22/2014   HGB 14.5 10/22/2014   HCT 42.5 10/22/2014   PLT 239.0 10/22/2014   GLUCOSE 96 05/27/2015   CHOL 242* 05/27/2015   TRIG 93.0 05/27/2015   HDL 91.00 05/27/2015   LDLCALC 133* 05/27/2015   ALT 12 05/27/2015   AST 16 05/27/2015   NA 137 05/27/2015   K 4.1 05/27/2015   CL 100 05/27/2015   CREATININE 0.84 05/27/2015   BUN 15 05/27/2015   CO2 30 05/27/2015   TSH 0.95 10/22/2014   HGBA1C 5.5 05/27/2015        Assessment & Plan:   Problem List Items Addressed This Visit    Abnormal mammogram    Mammogram 06/29/14 - Birads I.  Desires to hold on further mammograms.       Hypercholesterolemia    On niacin.  Low cholesterol diet and exercise.  Follow lipid panel.        Relevant Medications   furosemide (LASIX) 20 MG tablet   benazepril (LOTENSIN) 40 MG tablet   Other Relevant Orders   Lipid panel   Hepatic function panel   Hyperglycemia    Low carb diet and exercise.  Follow met b and a1c.       Relevant Orders   Hemoglobin K1S   Basic metabolic panel   Hypertension    Blood pressure under good control.  Continue same medication regimen.  Follow pressures.  Follow metabolic panel.        Relevant Medications   furosemide (LASIX) 20 MG tablet   benazepril (LOTENSIN) 40 MG tablet   Other Relevant Orders   TSH   CBC with Differential/Platelet   Left arm numbness    Saw neurology.  NCS - carpal tunnel.  Wore splint.  Better.  Follow.       Stress    Handling stress well.  Follow.       Weight loss    Weight stable from previous check.  Follow.  States is eating.           Other Visit Diagnoses    Cough    -  Primary    Relevant Orders    DG Chest 2 View        Einar Pheasant, MD

## 2015-07-28 ENCOUNTER — Encounter: Payer: Self-pay | Admitting: Internal Medicine

## 2015-07-28 NOTE — Assessment & Plan Note (Signed)
On niacin.  Low cholesterol diet and exercise.  Follow lipid panel.

## 2015-07-28 NOTE — Assessment & Plan Note (Signed)
Saw neurology.  NCS - carpal tunnel.  Wore splint.  Better.  Follow.

## 2015-07-28 NOTE — Assessment & Plan Note (Signed)
Mammogram 06/29/14 - Birads I.  Desires to hold on further mammograms.

## 2015-07-28 NOTE — Assessment & Plan Note (Signed)
Low carb diet and exercise.  Follow met b and a1c.  

## 2015-07-28 NOTE — Assessment & Plan Note (Signed)
Handling stress well.  Follow.  

## 2015-07-28 NOTE — Assessment & Plan Note (Signed)
Blood pressure under good control.  Continue same medication regimen.  Follow pressures.  Follow metabolic panel.   

## 2015-07-28 NOTE — Assessment & Plan Note (Signed)
Weight stable from previous check.  Follow.  States is eating.

## 2015-07-29 ENCOUNTER — Ambulatory Visit
Admission: RE | Admit: 2015-07-29 | Discharge: 2015-07-29 | Disposition: A | Discharge status: Home / Self Care | Payer: PPO | Source: Ambulatory Visit | Attending: Internal Medicine | Admitting: Internal Medicine

## 2015-07-29 DIAGNOSIS — J9811 Atelectasis: Secondary | ICD-10-CM | POA: Insufficient documentation

## 2015-07-29 DIAGNOSIS — R05 Cough: Secondary | ICD-10-CM | POA: Insufficient documentation

## 2015-07-29 DIAGNOSIS — I517 Cardiomegaly: Secondary | ICD-10-CM | POA: Diagnosis not present

## 2015-07-29 DIAGNOSIS — R059 Cough, unspecified: Secondary | ICD-10-CM

## 2015-10-25 DIAGNOSIS — Z961 Presence of intraocular lens: Secondary | ICD-10-CM | POA: Diagnosis not present

## 2015-11-11 ENCOUNTER — Other Ambulatory Visit (INDEPENDENT_AMBULATORY_CARE_PROVIDER_SITE_OTHER): Payer: PPO

## 2015-11-11 DIAGNOSIS — I10 Essential (primary) hypertension: Secondary | ICD-10-CM | POA: Diagnosis not present

## 2015-11-11 DIAGNOSIS — R739 Hyperglycemia, unspecified: Secondary | ICD-10-CM | POA: Diagnosis not present

## 2015-11-11 DIAGNOSIS — E7800 Pure hypercholesterolemia, unspecified: Secondary | ICD-10-CM | POA: Diagnosis not present

## 2015-11-11 LAB — BASIC METABOLIC PANEL
BUN: 15 mg/dL (ref 6–23)
CHLORIDE: 102 meq/L (ref 96–112)
CO2: 32 meq/L (ref 19–32)
Calcium: 9.7 mg/dL (ref 8.4–10.5)
Creatinine, Ser: 0.83 mg/dL (ref 0.40–1.20)
GFR: 69.67 mL/min (ref >60.00)
Glucose, Bld: 95 mg/dL (ref 70–99)
POTASSIUM: 4 meq/L (ref 3.5–5.1)
Sodium: 139 mEq/L (ref 135–145)

## 2015-11-11 LAB — CBC WITH DIFFERENTIAL/PLATELET
BASOS PCT: 0.6 % (ref 0.0–3.0)
Basophils Absolute: 0 10*3/uL (ref 0.0–0.1)
EOS PCT: 2.7 % (ref 0.0–5.0)
Eosinophils Absolute: 0.1 10*3/uL (ref 0.0–0.7)
HCT: 41.9 % (ref 36.0–46.0)
HEMOGLOBIN: 14.4 g/dL (ref 12.0–15.0)
Lymphocytes Relative: 27.2 % (ref 12.0–46.0)
Lymphs Abs: 1.1 10*3/uL (ref 0.7–4.0)
MCHC: 34.4 g/dL (ref 30.0–36.0)
MCV: 95.6 fl (ref 78.0–100.0)
MONOS PCT: 9.7 % (ref 3.0–12.0)
Monocytes Absolute: 0.4 10*3/uL (ref 0.1–1.0)
Neutro Abs: 2.4 10*3/uL (ref 1.4–7.7)
Neutrophils Relative %: 59.8 % (ref 43.0–77.0)
Platelets: 239 10*3/uL (ref 150.0–400.0)
RBC: 4.38 Mil/uL (ref 3.87–5.11)
RDW: 13.2 % (ref 11.5–15.5)
WBC: 4.1 10*3/uL (ref 4.0–10.5)

## 2015-11-11 LAB — LIPID PANEL
CHOL/HDL RATIO: 3
Cholesterol: 239 mg/dL — ABNORMAL HIGH (ref 0–200)
HDL: 77.2 mg/dL (ref >39.00)
LDL Cholesterol: 138 mg/dL — ABNORMAL HIGH (ref 0–99)
NONHDL: 161.37
Triglycerides: 118 mg/dL (ref 0.0–149.0)
VLDL: 23.6 mg/dL (ref 0.0–40.0)

## 2015-11-11 LAB — HEPATIC FUNCTION PANEL
ALBUMIN: 4.2 g/dL (ref 3.5–5.2)
ALK PHOS: 58 U/L (ref 39–117)
ALT: 13 U/L (ref 0–35)
AST: 15 U/L (ref 0–37)
BILIRUBIN TOTAL: 0.6 mg/dL (ref 0.2–1.2)
Bilirubin, Direct: 0.1 mg/dL (ref 0.0–0.3)
Total Protein: 6.8 g/dL (ref 6.0–8.3)

## 2015-11-11 LAB — HEMOGLOBIN A1C: HEMOGLOBIN A1C: 5.4 % (ref 4.6–6.5)

## 2015-11-11 LAB — TSH: TSH: 0.91 u[IU]/mL (ref 0.35–4.50)

## 2015-11-13 ENCOUNTER — Encounter: Payer: Self-pay | Admitting: Internal Medicine

## 2015-11-13 ENCOUNTER — Ambulatory Visit (INDEPENDENT_AMBULATORY_CARE_PROVIDER_SITE_OTHER): Payer: PPO | Admitting: Internal Medicine

## 2015-11-13 VITALS — BP 120/70 | HR 68 | Temp 97.7°F | Resp 16 | Ht 59.0 in | Wt 129.0 lb

## 2015-11-13 DIAGNOSIS — E7800 Pure hypercholesterolemia, unspecified: Secondary | ICD-10-CM | POA: Diagnosis not present

## 2015-11-13 DIAGNOSIS — Z86010 Personal history of colonic polyps: Secondary | ICD-10-CM

## 2015-11-13 DIAGNOSIS — R634 Abnormal weight loss: Secondary | ICD-10-CM

## 2015-11-13 DIAGNOSIS — Z85820 Personal history of malignant melanoma of skin: Secondary | ICD-10-CM

## 2015-11-13 DIAGNOSIS — I10 Essential (primary) hypertension: Secondary | ICD-10-CM

## 2015-11-13 DIAGNOSIS — Z0000 Encounter for general adult medical examination without abnormal findings: Secondary | ICD-10-CM

## 2015-11-13 DIAGNOSIS — F439 Reaction to severe stress, unspecified: Secondary | ICD-10-CM

## 2015-11-13 DIAGNOSIS — Z658 Other specified problems related to psychosocial circumstances: Secondary | ICD-10-CM | POA: Diagnosis not present

## 2015-11-13 DIAGNOSIS — R739 Hyperglycemia, unspecified: Secondary | ICD-10-CM

## 2015-11-13 DIAGNOSIS — Z23 Encounter for immunization: Secondary | ICD-10-CM | POA: Diagnosis not present

## 2015-11-13 NOTE — Assessment & Plan Note (Signed)
Low cholesterol diet and exercise.  Has been on niacin.  LDL 138.

## 2015-11-13 NOTE — Assessment & Plan Note (Signed)
Colonoscopy 05/01/10 - six polyps.  GI felt no further colonoscopy warranted.

## 2015-11-13 NOTE — Progress Notes (Signed)
Patient ID: Erin [name], female   DOB: [birth year], 82 y.o.   MRN: [id number]   Subjective:    Patient ID: Erin [name], female    DOB: Mar 31, 1930, 82 y.o.   MRN: [id number]  HPI  Patient with past history of hypercholesterolemia and hypertension.  She comes in today to follow up on these issues and for a complete physical exam.  She is doing well.  Eating.  Weight overall stable from the last few checks.  No chest pain.  No sob.  No abdominal pain or cramping.  Bowels stable.  Overall she feels she is doing well.  Discussed her cholesterol results.  Decided to hold on medication.  Taking statin medication.   Handling stress relatively well.  Increased stress with  Her daughter's health issues.      Past Medical History:  Diagnosis Date  . History of colon polyps   . Hypercholesterolemia   . Hypertension    Past Surgical History:  Procedure Laterality Date  . APPENDECTOMY  1946  . CATARACT EXTRACTION Bilateral   . DILATION AND CURETTAGE OF UTERUS    . LAMINECTOMY  2000  . release of foraminal stenosis  2003   Family History  Problem Relation Age of Onset  . Breast cancer Maternal Aunt   . Hypertension Mother   . Hypertension Sister   . Heart disease Brother     S/P CABG  . Multiple sclerosis Sister     died 55   Social History   Social History  . Marital status: Widowed    Spouse name: N/A  . Number of children: 4  . Years of education: N/A   Occupational History  . retired    Social History Main Topics  . Smoking status: Former Research scientist (life sciences)  . Smokeless tobacco: Never Used  . Alcohol use 0.0 oz/week     Comment: rare  . Drug use: No  . Sexual activity: Not Asked   Other Topics Concern  . None   Social History Narrative  . None    Outpatient Encounter Prescriptions as of 11/13/2015  Medication Sig  . aspirin 81 MG tablet Take 81 mg by mouth daily.  . benazepril (LOTENSIN) 40 MG tablet Take 1 tablet (40 mg total) by mouth 2 (two) times daily.  . Calcium  Carbonate-Vitamin D (CALCIUM 600/VITAMIN D) 600-400 MG-UNIT per tablet Take 1 tablet by mouth daily.  . felodipine (PLENDIL) 2.5 MG 24 hr tablet Take 1 tablet (2.5 mg total) by mouth daily.  . furosemide (LASIX) 20 MG tablet Take 1 tablet (20 mg total) by mouth daily.  . Multiple Vitamin (MULTIVITAMIN) tablet Take 1 tablet by mouth daily.  . niacin 500 MG tablet Take 1,500 mg by mouth at bedtime.   . Omega-3 Fatty Acids (FISH OIL) 1000 MG CAPS Take by mouth daily.  . Red Yeast Rice Extract (RED YEAST RICE PO) Take 2 tablets by mouth.   No facility-administered encounter medications on file as of 11/13/2015.     Review of Systems  Constitutional: Negative for appetite change and unexpected weight change.  HENT: Negative for congestion and sinus pressure.   Eyes: Negative for pain and visual disturbance.  Respiratory: Negative for cough, chest tightness and shortness of breath.   Cardiovascular: Negative for chest pain, palpitations and leg swelling.  Gastrointestinal: Negative for abdominal pain, diarrhea and nausea.  Genitourinary: Negative for difficulty urinating and dysuria.  Musculoskeletal: Negative for back pain and joint swelling.  Skin: Negative for color change  and rash.  Neurological: Negative for dizziness, light-headedness and headaches.  Hematological: Negative for adenopathy. Does not bruise/bleed easily.  Psychiatric/Behavioral: Negative for agitation and dysphoric mood.       Increased stress as outlined.         Objective:     Blood pressure rechecked by me:  134/78  Physical Exam  Constitutional: She is oriented to person, place, and time. She appears well-developed and well-nourished. No distress.  HENT:  Nose: Nose normal.  Mouth/Throat: Oropharynx is clear and moist.  Eyes: Right eye exhibits no discharge. Left eye exhibits no discharge. No scleral icterus.  Neck: Neck supple. No thyromegaly present.  Cardiovascular: Normal rate and regular rhythm.     Pulmonary/Chest: Breath sounds normal. No accessory muscle usage. No tachypnea. No respiratory distress. She has no decreased breath sounds. She has no wheezes. She has no rhonchi. Right breast exhibits no inverted nipple, no mass, no nipple discharge and no tenderness (no axillary adenopathy). Left breast exhibits no inverted nipple, no mass, no nipple discharge and no tenderness (no axilarry adenopathy).  Abdominal: Soft. Bowel sounds are normal. There is no tenderness.  Musculoskeletal: She exhibits no edema or tenderness.  Lymphadenopathy:    She has no cervical adenopathy.  Neurological: She is alert and oriented to person, place, and time.  Skin: Skin is warm. No rash noted. No erythema.  Psychiatric: She has a normal mood and affect. Her behavior is normal.    BP 120/70 (BP Location: Right Arm, Patient Position: Sitting, Cuff Size: Normal)   Pulse 68   Temp 97.7 F (36.5 C) (Oral)   Resp 16   Ht _0  (1.499 m)   Wt 129 lb (58.5 kg)   BMI 26.05 kg/m  Wt Readings from Last 3 Encounters:  11/13/15 129 lb (58.5 kg)  07/25/15 130 lb 8 oz (59.2 kg)  05/31/15 131 lb 12.8 oz (59.8 kg)     Lab Results  Component Value Date   WBC 4.1 11/11/2015   HGB 14.4 11/11/2015   HCT 41.9 11/11/2015   PLT 239.0 11/11/2015   GLUCOSE 95 11/11/2015   CHOL 239 (H) 11/11/2015   TRIG 118.0 11/11/2015   HDL 77.20 11/11/2015   LDLCALC 138 (H) 11/11/2015   ALT 13 11/11/2015   AST 15 11/11/2015   NA 139 11/11/2015   K 4.0 11/11/2015   CL 102 11/11/2015   CREATININE 0.83 11/11/2015   BUN 15 11/11/2015   CO2 32 11/11/2015   TSH 0.91 11/11/2015   HGBA1C 5.4 11/11/2015    Dg Chest 2 View  Result Date: 07/29/2015 CLINICAL DATA:  Persistent cough. EXAM: CHEST  2 VIEW COMPARISON:  05/07/2006. FINDINGS: Mediastinum and hilar structures normal. Elevation left hemidiaphragm. Stable from prior exam . Mild left base subsegmental atelectasis. No pleural effusion or pneumothorax. Cardiomegaly with  normal pulmonary vascularity . IMPRESSION: 1. Elevation left hemidiaphragm, stable from prior exam. Mild left base subsegmental atelectasis . 2. Cardiomegaly with normal pulmonary vascularity. Electronically Signed   By: Marcello Moores  Register   On: 07/29/2015 15:39       Assessment & Plan:   Problem List Items Addressed This Visit    Health care maintenance    Physical today 11/13/15.  Mammogram 06/29/14 - Birads I.  Declines f/u mammogram.  Colonoscopy as outlined.        History of colonic polyps    Colonoscopy 05/01/10 - six polyps.  GI felt no further colonoscopy warranted.        History  of melanoma    Followed by dermatology.       Hypercholesterolemia    Low cholesterol diet and exercise.  Has been on niacin.  LDL 138.        Relevant Orders   Lipid panel   Hepatic function panel   Hyperglycemia    Low carb diet and exercise.  Follow met b and a1c.       Relevant Orders   Hemoglobin A1c   Hypertension    Blood pressure under good control.  Continue same medication regimen.  Follow pressures.  Follow metabolic panel.        Relevant Orders   Basic metabolic panel   Stress    Discussed with her today.  Overall she feels she is handling things relatively well.  Follow.        Weight loss    Stable over the last few checks.  Is eating.  Follow.        Other Visit Diagnoses   None.      Einar Pheasant, MD

## 2015-11-13 NOTE — Assessment & Plan Note (Signed)
Discussed with her today.  Overall she feels she is handling things relatively well.  Follow.   

## 2015-11-13 NOTE — Addendum Note (Signed)
Addended by: Silvio Clayman on: 11/13/2015 10:19 AM   Modules accepted: Orders

## 2015-11-13 NOTE — Assessment & Plan Note (Signed)
Blood pressure under good control.  Continue same medication regimen.  Follow pressures.  Follow metabolic panel.   

## 2015-11-13 NOTE — Assessment & Plan Note (Signed)
Physical today 11/13/15.  Mammogram 06/29/14 - Birads I.  Declines f/u mammogram.  Colonoscopy as outlined.

## 2015-11-13 NOTE — Assessment & Plan Note (Signed)
Low carb diet and exercise.  Follow met b and a1c.  

## 2015-11-13 NOTE — Assessment & Plan Note (Signed)
Stable over the last few checks.  Is eating.  Follow.

## 2015-11-13 NOTE — Assessment & Plan Note (Signed)
Followed by dermatology

## 2016-01-06 ENCOUNTER — Other Ambulatory Visit: Payer: Self-pay | Admitting: Internal Medicine

## 2016-01-22 DIAGNOSIS — B079 Viral wart, unspecified: Secondary | ICD-10-CM | POA: Diagnosis not present

## 2016-01-22 DIAGNOSIS — Z85828 Personal history of other malignant neoplasm of skin: Secondary | ICD-10-CM | POA: Diagnosis not present

## 2016-01-22 DIAGNOSIS — L565 Disseminated superficial actinic porokeratosis (DSAP): Secondary | ICD-10-CM | POA: Diagnosis not present

## 2016-01-22 DIAGNOSIS — X32XXXA Exposure to sunlight, initial encounter: Secondary | ICD-10-CM | POA: Diagnosis not present

## 2016-01-22 DIAGNOSIS — Z85820 Personal history of malignant melanoma of skin: Secondary | ICD-10-CM | POA: Diagnosis not present

## 2016-01-22 DIAGNOSIS — L570 Actinic keratosis: Secondary | ICD-10-CM | POA: Diagnosis not present

## 2016-01-22 DIAGNOSIS — D485 Neoplasm of uncertain behavior of skin: Secondary | ICD-10-CM | POA: Diagnosis not present

## 2016-03-12 ENCOUNTER — Other Ambulatory Visit: Payer: PPO

## 2016-03-17 ENCOUNTER — Ambulatory Visit: Payer: PPO | Admitting: Internal Medicine

## 2016-03-31 ENCOUNTER — Telehealth: Payer: Self-pay | Admitting: *Deleted

## 2016-03-31 ENCOUNTER — Other Ambulatory Visit (INDEPENDENT_AMBULATORY_CARE_PROVIDER_SITE_OTHER): Payer: PPO

## 2016-03-31 DIAGNOSIS — I10 Essential (primary) hypertension: Secondary | ICD-10-CM | POA: Diagnosis not present

## 2016-03-31 DIAGNOSIS — E7800 Pure hypercholesterolemia, unspecified: Secondary | ICD-10-CM

## 2016-03-31 DIAGNOSIS — R739 Hyperglycemia, unspecified: Secondary | ICD-10-CM

## 2016-03-31 LAB — HEPATIC FUNCTION PANEL
ALBUMIN: 4.3 g/dL (ref 3.5–5.2)
ALK PHOS: 56 U/L (ref 39–117)
ALT: 12 U/L (ref 0–35)
AST: 16 U/L (ref 0–37)
BILIRUBIN TOTAL: 0.7 mg/dL (ref 0.2–1.2)
Bilirubin, Direct: 0.1 mg/dL (ref 0.0–0.3)
Total Protein: 6.8 g/dL (ref 6.0–8.3)

## 2016-03-31 LAB — BASIC METABOLIC PANEL
BUN: 15 mg/dL (ref 6–23)
CALCIUM: 10.3 mg/dL (ref 8.4–10.5)
CO2: 28 meq/L (ref 19–32)
CREATININE: 0.87 mg/dL (ref 0.40–1.20)
Chloride: 102 mEq/L (ref 96–112)
GFR: 65.93 mL/min (ref >60.00)
GLUCOSE: 98 mg/dL (ref 70–99)
Potassium: 4.1 mEq/L (ref 3.5–5.1)
SODIUM: 141 meq/L (ref 135–145)

## 2016-03-31 LAB — LIPID PANEL
CHOLESTEROL: 255 mg/dL — AB (ref 0–200)
HDL: 93.5 mg/dL (ref >39.00)
LDL Cholesterol: 134 mg/dL — ABNORMAL HIGH (ref 0–99)
NONHDL: 161.22
Total CHOL/HDL Ratio: 3
Triglycerides: 134 mg/dL (ref 0.0–149.0)
VLDL: 26.8 mg/dL (ref 0.0–40.0)

## 2016-03-31 NOTE — Telephone Encounter (Signed)
Yes if pt ok with [place] doing this.  Please make her aware.  Thanks

## 2016-03-31 NOTE — Telephone Encounter (Signed)
Received a call from lab that A1C could not be ran. Clotted in tube. Pt has an OV this Friday 04/03/16. We can redraw after her visit.

## 2016-04-03 ENCOUNTER — Ambulatory Visit (INDEPENDENT_AMBULATORY_CARE_PROVIDER_SITE_OTHER): Payer: PPO | Admitting: Internal Medicine

## 2016-04-03 ENCOUNTER — Encounter: Payer: Self-pay | Admitting: Internal Medicine

## 2016-04-03 ENCOUNTER — Telehealth: Payer: Self-pay | Admitting: *Deleted

## 2016-04-03 VITALS — BP 138/82 | HR 63 | Temp 97.8°F | Ht 59.0 in | Wt 130.2 lb

## 2016-04-03 DIAGNOSIS — I10 Essential (primary) hypertension: Secondary | ICD-10-CM | POA: Diagnosis not present

## 2016-04-03 DIAGNOSIS — E7800 Pure hypercholesterolemia, unspecified: Secondary | ICD-10-CM

## 2016-04-03 DIAGNOSIS — R739 Hyperglycemia, unspecified: Secondary | ICD-10-CM | POA: Diagnosis not present

## 2016-04-03 DIAGNOSIS — F439 Reaction to severe stress, unspecified: Secondary | ICD-10-CM | POA: Diagnosis not present

## 2016-04-03 DIAGNOSIS — R928 Other abnormal and inconclusive findings on diagnostic imaging of breast: Secondary | ICD-10-CM

## 2016-04-03 LAB — HEMOGLOBIN A1C: HEMOGLOBIN A1C: 5.4 % (ref 4.6–6.5)

## 2016-04-03 MED ORDER — FELODIPINE ER 2.5 MG PO TB24: ORAL_TABLET | ORAL | 6 refills | Status: DC

## 2016-04-03 NOTE — Telephone Encounter (Signed)
Please schedule pt in 6-7 weeks for a follow up, as requested by Dr.Scott -thanks

## 2016-04-03 NOTE — Progress Notes (Signed)
Pre visit review using our clinic review tool, if applicable. No additional management support is needed unless otherwise documented below in the visit note. 

## 2016-04-03 NOTE — Progress Notes (Signed)
Patient ID: Erin [name], female   DOB: 06-03-1930, 83 y.o.   MRN: [id number]   Subjective:    Patient ID: Erin [name], female    DOB: [birth year], 83 y.o.   MRN: [id number]  HPI  Patient here for a scheduled follow up.  She has been under increased stress recently.  She is trying to cope with her daughter's recent death.  She feels she is doing ok overall.  Has good support.  Does not feel needs any further intervention.  No chest pain.  No sob.  No acid reflux.  No abdominal pain or cramping.  Bowels stable.     Past Medical History:  Diagnosis Date  . History of colon polyps   . Hypercholesterolemia   . Hypertension    Past Surgical History:  Procedure Laterality Date  . APPENDECTOMY  1946  . CATARACT EXTRACTION Bilateral   . DILATION AND CURETTAGE OF UTERUS    . LAMINECTOMY  2000  . release of foraminal stenosis  2003   Family History  Problem Relation Age of Onset  . Breast cancer Maternal Aunt   . Hypertension Mother   . Hypertension Sister   . Heart disease Brother     S/P CABG  . Multiple sclerosis Sister     died 69   Social History   Social History  . Marital status: Widowed    Spouse name: N/A  . Number of children: 4  . Years of education: N/A   Occupational History  . retired    Social History Main Topics  . Smoking status: Former Research scientist (life sciences)  . Smokeless tobacco: Never Used  . Alcohol use 0.0 oz/week     Comment: rare  . Drug use: No  . Sexual activity: Not Asked   Other Topics Concern  . None   Social History Narrative  . None    Outpatient Encounter Prescriptions as of 04/03/2016  Medication Sig  . aspirin 81 MG tablet Take 81 mg by mouth daily.  . benazepril (LOTENSIN) 40 MG tablet Take 1 tablet (40 mg total) by mouth 2 (two) times daily.  . Calcium Carbonate-Vitamin D (CALCIUM 600/VITAMIN D) 600-400 MG-UNIT per tablet Take 1 tablet by mouth daily.  . felodipine (PLENDIL) 2.5 MG 24 hr tablet Take 1 tablet (2.5 mg total) by mouth daily.  .  furosemide (LASIX) 20 MG tablet Take 1 tablet (20 mg total) by mouth daily.  . Multiple Vitamin (MULTIVITAMIN) tablet Take 1 tablet by mouth daily.  . niacin 500 MG tablet Take 1,500 mg by mouth at bedtime.   . Omega-3 Fatty Acids (FISH OIL) 1000 MG CAPS Take by mouth daily.  . Red Yeast Rice Extract (RED YEAST RICE PO) Take 2 tablets by mouth.  . [DISCONTINUED] felodipine (PLENDIL) 2.5 MG 24 hr tablet Take 1 tablet (2.5 mg total) by mouth daily.   No facility-administered encounter medications on file as of 04/03/2016.     Review of Systems  Constitutional: Negative for appetite change and unexpected weight change.  HENT: Negative for congestion and sinus pressure.   Respiratory: Negative for cough, chest tightness and shortness of breath.   Cardiovascular: Negative for chest pain, palpitations and leg swelling.  Gastrointestinal: Negative for abdominal pain, diarrhea, nausea and vomiting.  Genitourinary: Negative for difficulty urinating and dysuria.  Musculoskeletal: Negative for back pain and joint swelling.  Skin: Negative for color change and rash.  Neurological: Negative for dizziness, light-headedness and headaches.  Psychiatric/Behavioral: Negative for agitation and dysphoric mood.  Objective:    Physical Exam  Constitutional: She appears well-developed and well-nourished. No distress.  HENT:  Nose: Nose normal.  Mouth/Throat: Oropharynx is clear and moist.  Neck: Neck supple. No thyromegaly present.  Cardiovascular: Normal rate and regular rhythm.   Pulmonary/Chest: Breath sounds normal. No respiratory distress. She has no wheezes.  Abdominal: Soft. Bowel sounds are normal. There is no tenderness.  Musculoskeletal: She exhibits no edema or tenderness.  Lymphadenopathy:    She has no cervical adenopathy.  Skin: No rash noted. No erythema.  Psychiatric: She has a normal mood and affect. Her behavior is normal.    BP 138/82   Pulse 63   Temp 97.8 F (36.6 C)  (Oral)   Ht '4\' 11"'  (1.499 m)   Wt 130 lb 3.2 oz (59.1 kg)   SpO2 96%   BMI 26.30 kg/m  Wt Readings from Last 3 Encounters:  04/03/16 130 lb 3.2 oz (59.1 kg)  11/13/15 129 lb (58.5 kg)  07/25/15 130 lb 8 oz (59.2 kg)     Lab Results  Component Value Date   WBC 4.1 11/11/2015   HGB 14.4 11/11/2015   HCT 41.9 11/11/2015   PLT 239.0 11/11/2015   GLUCOSE 98 03/31/2016   CHOL 255 (H) 03/31/2016   TRIG 134.0 03/31/2016   HDL 93.50 03/31/2016   LDLCALC 134 (H) 03/31/2016   ALT 12 03/31/2016   AST 16 03/31/2016   NA 141 03/31/2016   K 4.1 03/31/2016   CL 102 03/31/2016   CREATININE 0.87 03/31/2016   BUN 15 03/31/2016   CO2 28 03/31/2016   TSH 0.91 11/11/2015   HGBA1C 5.4 04/03/2016    Dg Chest 2 View  Result Date: 07/29/2015 CLINICAL DATA:  Persistent cough. EXAM: CHEST  2 VIEW COMPARISON:  05/07/2006. FINDINGS: Mediastinum and hilar structures normal. Elevation left hemidiaphragm. Stable from prior exam . Mild left base subsegmental atelectasis. No pleural effusion or pneumothorax. Cardiomegaly with normal pulmonary vascularity . IMPRESSION: 1. Elevation left hemidiaphragm, stable from prior exam. Mild left base subsegmental atelectasis . 2. Cardiomegaly with normal pulmonary vascularity. Electronically Signed   By: Marcello Moores  Register   On: 07/29/2015 15:39       Assessment & Plan:   Problem List Items Addressed This Visit    Abnormal mammogram    Discussed with her today.  She declines mammogram.        Hypercholesterolemia    Low cholesterol diet and exercise.  Follow lipid panel.        Relevant Medications   felodipine (PLENDIL) 2.5 MG 24 hr tablet   Hyperglycemia    Low carb diet and exercise.  Follow met b and a1c.        Hypertension    Blood pressure as outlined.  Slightly elevated on recheck.  Have her spot check her pressure.  Get her back in for earlier appt to reassess.        Relevant Medications   felodipine (PLENDIL) 2.5 MG 24 hr tablet   Stress      Increased stress as outlined.  Discussed with her today.  She does not feel needs any further intervention.  Good support.  Follow.         Other Visit Diagnoses    Blood glucose elevated    -  Primary   Relevant Orders   HgB A1c (Completed)       Einar Pheasant, MD

## 2016-04-03 NOTE — Telephone Encounter (Signed)
Patient has appointment 01-22-17

## 2016-04-03 NOTE — Telephone Encounter (Signed)
Please let pt know at today appt since we were not here the past two days for me to notify her.

## 2016-04-03 NOTE — Telephone Encounter (Signed)
lm for pt to call back and schedule appt

## 2016-04-05 ENCOUNTER — Encounter: Payer: Self-pay | Admitting: Internal Medicine

## 2016-04-05 NOTE — Assessment & Plan Note (Signed)
Blood pressure as outlined.  Slightly elevated on recheck.  Have her spot check her pressure.  Get her back in for earlier appt to reassess.

## 2016-04-05 NOTE — Assessment & Plan Note (Signed)
Low cholesterol diet and exercise.  Follow lipid panel.   

## 2016-04-05 NOTE — Assessment & Plan Note (Signed)
Low carb diet and exercise.  Follow met b and a1c.   

## 2016-04-05 NOTE — Assessment & Plan Note (Signed)
Increased stress as outlined.  Discussed with her today.  She does not feel needs any further intervention.  Good support.  Follow.

## 2016-04-05 NOTE — Assessment & Plan Note (Signed)
Discussed with her today.  She declines mammogram.

## 2016-04-06 ENCOUNTER — Encounter: Payer: Self-pay | Admitting: *Deleted

## 2016-04-22 ENCOUNTER — Ambulatory Visit (INDEPENDENT_AMBULATORY_CARE_PROVIDER_SITE_OTHER): Payer: PPO

## 2016-04-22 VITALS — BP 130/70 | HR 88 | Temp 97.6°F | Resp 12 | Ht 59.0 in | Wt 129.4 lb

## 2016-04-22 DIAGNOSIS — Z0000 Encounter for general adult medical examination without abnormal findings: Secondary | ICD-10-CM | POA: Diagnosis not present

## 2016-04-22 NOTE — Patient Instructions (Addendum)
  Ms. Poppert , Thank you for taking time to come for your Medicare Wellness Visit. I appreciate your ongoing commitment to your health goals. Please review the following plan we discussed and let me know if I can assist you in the future.   Follow up with Dr. Nicki Reaper as needed.  These are the goals we discussed: Goals    . Healthy Lifestyle          Maintain exercise regimen Stay hydrated! Drink plenty of fluids Choose lean meats (chicken, [place], fish), fruits and vegetables       This is a list of the screening recommended for you and due dates:  Health Maintenance  Topic Date Due  . Mammogram  07/24/2016*  . Tetanus Vaccine  06/01/2022  . Flu Shot  Addressed  . DEXA scan (bone density measurement)  Completed  . Shingles Vaccine  Addressed  . Pneumonia vaccines  Completed  *Topic was postponed. The date shown is not the original due date.

## 2016-04-22 NOTE — Progress Notes (Signed)
Subjective:   Erin [name] is a 83 y.o. female who presents for Medicare Annual (Subsequent) preventive examination.  Review of Systems:  No ROS.  Medicare Wellness Visit.  Cardiac Risk Factors include: advanced age (>39men, >64 women);hypertension     Objective:     Vitals: BP 130/70 (BP Location: Left Arm, Patient Position: Sitting, Cuff Size: Normal)   Pulse 88   Temp 97.6 F (36.4 C) (Oral)   Resp 12   Ht 4\' 11"  (1.499 m)   Wt 129 lb 6.4 oz (58.7 kg)   SpO2 95%   BMI 26.14 kg/m   Body mass index is 26.14 kg/m.   Tobacco History  Smoking Status  . Former Smoker  Smokeless Tobacco  . Never Used     Counseling given: Not Answered   Past Medical History:  Diagnosis Date  . History of colon polyps   . Hypercholesterolemia   . Hypertension    Past Surgical History:  Procedure Laterality Date  . APPENDECTOMY  1946  . CATARACT EXTRACTION Bilateral   . DILATION AND CURETTAGE OF UTERUS    . LAMINECTOMY  2000  . release of foraminal stenosis  2003   Family History  Problem Relation Age of Onset  . Breast cancer Maternal Aunt   . Hypertension Mother   . Hypertension Sister   . Heart disease Brother     S/P CABG  . Multiple sclerosis Sister     died 43   History  Sexual Activity  . Sexual activity: No    Outpatient Encounter Prescriptions as of 04/22/2016  Medication Sig  . aspirin 81 MG tablet Take 81 mg by mouth daily.  . benazepril (LOTENSIN) 40 MG tablet Take 1 tablet (40 mg total) by mouth 2 (two) times daily.  . Calcium Carbonate-Vitamin D (CALCIUM 600/VITAMIN D) 600-400 MG-UNIT per tablet Take 1 tablet by mouth daily.  . felodipine (PLENDIL) 2.5 MG 24 hr tablet Take 1 tablet (2.5 mg total) by mouth daily.  . furosemide (LASIX) 20 MG tablet Take 1 tablet (20 mg total) by mouth daily.  . Multiple Vitamin (MULTIVITAMIN) tablet Take 1 tablet by mouth daily.  . niacin 500 MG tablet Take 1,500 mg by mouth at bedtime.   . Omega-3 Fatty Acids (FISH  OIL) 1000 MG CAPS Take by mouth daily.  . Red Yeast Rice Extract (RED YEAST RICE PO) Take 2 tablets by mouth.   No facility-administered encounter medications on file as of 04/22/2016.     Activities of Daily Living In your present state of health, do you have any difficulty performing the following activities: 04/22/2016 11/13/2015  Hearing? Erin [name]  Vision? N N  Difficulty concentrating or making decisions? N N  Walking or climbing stairs? N N  Dressing or bathing? N N  Doing errands, shopping? N N  Preparing Food and eating ? N -  Using the Toilet? N -  In the past six months, have you accidently leaked urine? N -  Do you have problems with loss of bowel control? N -  Managing your Medications? N -  Managing your Finances? N -  Housekeeping or managing your Housekeeping? Y -  Some recent data might be hidden    Patient Care Team: Erin Pheasant, MD as PCP - General (Internal Medicine)    Assessment:    This is a routine wellness examination for Erin [name]. The goal of the wellness visit is to assist the patient how to close the gaps in care and  create a preventative care plan for the patient.   Taking calcium VIT D as appropriate/Osteoporosis risk reviewed.  Medications reviewed; taking without issues or barriers.  Safety issues reviewed; smoke detectors in the home. No firearms in the home. Wears seatbelts when driving or riding with others. No violence in the home.  No identified risk were noted; The patient was oriented x 3; appropriate in dress and manner and no objective failures at ADL's or IADL's.   BMI; discussed the importance of a healthy diet, water intake and exercise. Educational material provided.  HTN; followed by PCP.  Health maintenance gaps; closed.  Patient Concerns: None at this time. Follow up with PCP as needed.  Exercise Activities and Dietary recommendations Current Exercise Habits: Home exercise routine, Type of exercise: walking, Time  (Minutes): 45, Frequency (Times/Week): 5, Weekly Exercise (Minutes/Week): 225, Intensity: Moderate  Goals    . Healthy Lifestyle          Maintain exercise regimen Stay hydrated! Drink plenty of fluids Choose lean meats (chicken, [place], fish), fruits and vegetables      Fall Risk Fall Risk  04/22/2016 11/13/2015 04/23/2015 01/22/2015 10/24/2014  Falls in the past year? No No No No No   Depression Screen PHQ 2/9 Scores 04/22/2016 11/13/2015 04/23/2015 01/22/2015  PHQ - 2 Score 0 0 0 0     Cognitive Function MMSE - Mini Mental State Exam 04/23/2015  Orientation to time 5  Orientation to Place 5  Registration 3  Attention/ Calculation 5  Recall 3  Language- name 2 objects 2  Language- repeat 1  Language- follow 3 step command 3  Language- read & follow direction 1  Write a sentence 1  Copy design 1  Total score 30     6CIT Screen 04/22/2016  What Year? 0 points  What month? 0 points  What time? 0 points  Count back from 20 0 points  Months in reverse 0 points  Repeat phrase 0 points  Total Score 0    Immunization History  Administered Date(s) Administered  . Influenza Split 12/05/2013  . Influenza,inj,Quad PF,36+ Mos 11/30/2012  . Influenza-Unspecified 11/14/2013, 12/11/2014, 12/06/2015  . Pneumococcal Conjugate-13 10/24/2014  . Pneumococcal Polysaccharide-23 11/13/2015  . Tdap 05/31/2012  . Zoster 02/14/2011   Screening Tests Health Maintenance  Topic Date Due  . MAMMOGRAM  07/24/2016 (Originally 12/29/2014)  . TETANUS/TDAP  06/01/2022  . INFLUENZA VACCINE  Addressed  . DEXA SCAN  Completed  . ZOSTAVAX  Addressed  . PNA vac Low Risk Adult  Completed      Plan:    End of life planning; Advance aging; Advanced directives discussed. Copy of current HCPOA/Living Will on file.  Medicare Attestation I have personally reviewed: The patient's medical and social history Their use of alcohol, tobacco or illicit drugs Their current medications and supplements The  patient's functional ability including ADLs,fall risks, home safety risks, cognitive, and hearing and visual impairment Diet and physical activities Evidence for depression   The patient's weight, height, BMI, and visual acuity have been recorded in the chart.  I have made referrals and provided education to the patient based on review of the above and I have provided the patient with a written personalized care plan for preventive services.    During the course of the visit the patient was educated and counseled about the following appropriate screening and preventive services:   Vaccines to include Pneumoccal, Influenza, Hepatitis B, Td, Zostavax, HCV  Electrocardiogram  Cardiovascular Disease  Colorectal cancer  screening  Bone density screening  Diabetes screening  Glaucoma screening  Mammography/PAP  Nutrition counseling   Patient Instructions (the written plan) was given to the patient.   Varney Biles, LPN  [id number]    Reviewed above information.  Agree with plan.  Dr Nicki Reaper

## 2016-05-22 ENCOUNTER — Encounter: Payer: Self-pay | Admitting: Internal Medicine

## 2016-05-22 ENCOUNTER — Ambulatory Visit (INDEPENDENT_AMBULATORY_CARE_PROVIDER_SITE_OTHER): Payer: PPO | Admitting: Internal Medicine

## 2016-05-22 DIAGNOSIS — E7800 Pure hypercholesterolemia, unspecified: Secondary | ICD-10-CM | POA: Diagnosis not present

## 2016-05-22 DIAGNOSIS — I10 Essential (primary) hypertension: Secondary | ICD-10-CM | POA: Diagnosis not present

## 2016-05-22 DIAGNOSIS — F439 Reaction to severe stress, unspecified: Secondary | ICD-10-CM

## 2016-05-22 DIAGNOSIS — R739 Hyperglycemia, unspecified: Secondary | ICD-10-CM | POA: Diagnosis not present

## 2016-05-22 NOTE — Progress Notes (Signed)
Pre-visit discussion using our clinic review tool. No additional management support is needed unless otherwise documented below in the visit note.  

## 2016-05-22 NOTE — Progress Notes (Signed)
Patient ID: Erin [name], female   DOB: 1930-03-27, 83 y.o.   MRN: [id number]   Subjective:    Patient ID: Erin [name], female    DOB: 26-May-1930, 83 y.o.   MRN: [id number]  HPI  Patient here for a scheduled follow up.  She reports she is doing well.  Feels good.  Is staying active.  No chest pain.  No sob.  No acid reflux.  No abdominal pain or cramping.  Bowels stable.  Handling stress well.  Has good support.  Brings in her blood pressure readings.  Blood pressures under good control.    Past Medical History:  Diagnosis Date  . History of colon polyps   . Hypercholesterolemia   . Hypertension    Past Surgical History:  Procedure Laterality Date  . APPENDECTOMY  1946  . CATARACT EXTRACTION Bilateral   . DILATION AND CURETTAGE OF UTERUS    . LAMINECTOMY  2000  . release of foraminal stenosis  2003   Family History  Problem Relation Age of Onset  . Breast cancer Maternal Aunt   . Hypertension Mother   . Hypertension Sister   . Heart disease Brother     S/P CABG  . Multiple sclerosis Sister     died 33   Social History   Social History  . Marital status: Widowed    Spouse name: N/A  . Number of children: 4  . Years of education: N/A   Occupational History  . retired    Social History Main Topics  . Smoking status: Former Research scientist (life sciences)  . Smokeless tobacco: Never Used  . Alcohol use 0.0 oz/week     Comment: rare  . Drug use: No  . Sexual activity: No   Other Topics Concern  . None   Social History Narrative  . None    Outpatient Encounter Prescriptions as of 05/22/2016  Medication Sig  . aspirin 81 MG tablet Take 81 mg by mouth daily.  . benazepril (LOTENSIN) 40 MG tablet Take 1 tablet (40 mg total) by mouth 2 (two) times daily.  . Calcium Carbonate-Vitamin D (CALCIUM 600/VITAMIN D) 600-400 MG-UNIT per tablet Take 1 tablet by mouth daily.  . felodipine (PLENDIL) 2.5 MG 24 hr tablet Take 1 tablet (2.5 mg total) by mouth daily.  . furosemide (LASIX) 20 MG tablet  Take 1 tablet (20 mg total) by mouth daily.  . Multiple Vitamin (MULTIVITAMIN) tablet Take 1 tablet by mouth daily.  . niacin 500 MG tablet Take 1,500 mg by mouth at bedtime.   . Omega-3 Fatty Acids (FISH OIL) 1000 MG CAPS Take by mouth daily.  . Red Yeast Rice Extract (RED YEAST RICE PO) Take 2 tablets by mouth.   No facility-administered encounter medications on file as of 05/22/2016.     Review of Systems  Constitutional: Negative for appetite change and unexpected weight change.  HENT: Negative for congestion and sinus pressure.   Respiratory: Negative for cough, chest tightness and shortness of breath.   Cardiovascular: Negative for chest pain, palpitations and leg swelling.  Gastrointestinal: Negative for abdominal pain, diarrhea, nausea and vomiting.  Genitourinary: Negative for difficulty urinating and dysuria.  Musculoskeletal: Negative for back pain and joint swelling.  Skin: Negative for color change and rash.  Neurological: Negative for dizziness, light-headedness and headaches.  Psychiatric/Behavioral: Negative for agitation and dysphoric mood.       Objective:    Physical Exam  Constitutional: She appears well-developed and well-nourished. No distress.  HENT:  Nose:  Nose normal.  Mouth/Throat: Oropharynx is clear and moist.  Neck: Neck supple. No thyromegaly present.  Cardiovascular: Normal rate and regular rhythm.   Pulmonary/Chest: Breath sounds normal. No respiratory distress. She has no wheezes.  Abdominal: Soft. Bowel sounds are normal. There is no tenderness.  Musculoskeletal: She exhibits no edema or tenderness.  Lymphadenopathy:    She has no cervical adenopathy.  Skin: No rash noted. No erythema.  Psychiatric: She has a normal mood and affect. Her behavior is normal.    BP 110/78 (BP Location: Left Arm, Patient Position: Sitting, Cuff Size: Normal)   Pulse 69   Temp 97.8 F (36.6 C) (Oral)   Resp 16   Ht '4\' 11"'  (1.499 m)   Wt 128 lb 12.8 oz (58.4  kg)   SpO2 96%   BMI 26.01 kg/m  Wt Readings from Last 3 Encounters:  05/22/16 128 lb 12.8 oz (58.4 kg)  04/22/16 129 lb 6.4 oz (58.7 kg)  04/03/16 130 lb 3.2 oz (59.1 kg)     Lab Results  Component Value Date   WBC 4.1 11/11/2015   HGB 14.4 11/11/2015   HCT 41.9 11/11/2015   PLT 239.0 11/11/2015   GLUCOSE 98 03/31/2016   CHOL 255 (H) 03/31/2016   TRIG 134.0 03/31/2016   HDL 93.50 03/31/2016   LDLCALC 134 (H) 03/31/2016   ALT 12 03/31/2016   AST 16 03/31/2016   NA 141 03/31/2016   K 4.1 03/31/2016   CL 102 03/31/2016   CREATININE 0.87 03/31/2016   BUN 15 03/31/2016   CO2 28 03/31/2016   TSH 0.91 11/11/2015   HGBA1C 5.4 04/03/2016    Dg Chest 2 View  Result Date: 07/29/2015 CLINICAL DATA:  Persistent cough. EXAM: CHEST  2 VIEW COMPARISON:  05/07/2006. FINDINGS: Mediastinum and hilar structures normal. Elevation left hemidiaphragm. Stable from prior exam . Mild left base subsegmental atelectasis. No pleural effusion or pneumothorax. Cardiomegaly with normal pulmonary vascularity . IMPRESSION: 1. Elevation left hemidiaphragm, stable from prior exam. Mild left base subsegmental atelectasis . 2. Cardiomegaly with normal pulmonary vascularity. Electronically Signed   By: Marcello Moores  Register   On: 07/29/2015 15:39       Assessment & Plan:   Problem List Items Addressed This Visit    Hypercholesterolemia    Low cholesterol diet and exercise.  Follow lipid panel.        Relevant Orders   Lipid panel   Hepatic function panel   Hyperglycemia    Low carb diet and exercise.  Follow met b and a1c.       Relevant Orders   Hemoglobin A1c   Hypertension    Blood pressure under good control.  Continue same medication regimen.  Follow pressures.  Follow metabolic panel.        Relevant Orders   CBC with Differential/Platelet   TSH   Basic metabolic panel   Stress    Doing well handling stress.  Has good support.  Follow.            Einar Pheasant, MD

## 2016-05-23 ENCOUNTER — Encounter: Payer: Self-pay | Admitting: Internal Medicine

## 2016-05-23 NOTE — Assessment & Plan Note (Signed)
Blood pressure under good control.  Continue same medication regimen.  Follow pressures.  Follow metabolic panel.   

## 2016-05-23 NOTE — Assessment & Plan Note (Signed)
Doing well handling stress.  Has good support.  Follow.

## 2016-05-23 NOTE — Assessment & Plan Note (Signed)
Low carb diet and exercise.  Follow met b and a1c.  

## 2016-05-23 NOTE — Assessment & Plan Note (Signed)
Low cholesterol diet and exercise.  Follow lipid panel.   

## 2016-06-11 ENCOUNTER — Emergency Department: Payer: PPO

## 2016-06-11 ENCOUNTER — Emergency Department
Admission: EM | Admit: 2016-06-11 | Discharge: 2016-06-11 | Disposition: A | Discharge status: Home / Self Care | Payer: PPO | Attending: Emergency Medicine | Admitting: Emergency Medicine

## 2016-06-11 ENCOUNTER — Encounter: Payer: Self-pay | Admitting: Emergency Medicine

## 2016-06-11 ENCOUNTER — Telehealth: Payer: Self-pay | Admitting: *Deleted

## 2016-06-11 DIAGNOSIS — Z79899 Other long term (current) drug therapy: Secondary | ICD-10-CM | POA: Diagnosis not present

## 2016-06-11 DIAGNOSIS — Z7982 Long term (current) use of aspirin: Secondary | ICD-10-CM | POA: Insufficient documentation

## 2016-06-11 DIAGNOSIS — R079 Chest pain, unspecified: Secondary | ICD-10-CM | POA: Diagnosis not present

## 2016-06-11 DIAGNOSIS — I708 Atherosclerosis of other arteries: Secondary | ICD-10-CM | POA: Diagnosis not present

## 2016-06-11 DIAGNOSIS — K5730 Diverticulosis of large intestine without perforation or abscess without bleeding: Secondary | ICD-10-CM | POA: Insufficient documentation

## 2016-06-11 DIAGNOSIS — Z87891 Personal history of nicotine dependence: Secondary | ICD-10-CM | POA: Insufficient documentation

## 2016-06-11 DIAGNOSIS — R072 Precordial pain: Secondary | ICD-10-CM | POA: Diagnosis not present

## 2016-06-11 DIAGNOSIS — I10 Essential (primary) hypertension: Secondary | ICD-10-CM | POA: Insufficient documentation

## 2016-06-11 LAB — CBC
HCT: 39.9 % (ref 35.0–47.0)
Hemoglobin: 14 g/dL (ref 12.0–16.0)
MCH: 33.3 pg (ref 26.0–34.0)
MCHC: 35 g/dL (ref 32.0–36.0)
MCV: 95.4 fL (ref 80.0–100.0)
PLATELETS: 215 10*3/uL (ref 150–440)
RBC: 4.19 MIL/uL (ref 3.80–5.20)
RDW: 13.3 % (ref 11.5–14.5)
WBC: 4.7 10*3/uL (ref 3.6–11.0)

## 2016-06-11 LAB — BASIC METABOLIC PANEL
Anion gap: 7 (ref 5–15)
BUN: 15 mg/dL (ref 6–20)
CALCIUM: 10.2 mg/dL (ref 8.9–10.3)
CHLORIDE: 102 mmol/L (ref 101–111)
CO2: 29 mmol/L (ref 22–32)
CREATININE: 0.83 mg/dL (ref 0.44–1.00)
GFR calc non Af Amer: >60 mL/min (ref >60)
Glucose, Bld: 100 mg/dL — ABNORMAL HIGH (ref 65–99)
Potassium: 4.3 mmol/L (ref 3.5–5.1)
SODIUM: 138 mmol/L (ref 135–145)

## 2016-06-11 LAB — TROPONIN I
Troponin I: <0.03 ng/mL (ref <0.03)
Troponin I: <0.03 ng/mL (ref <0.03)

## 2016-06-11 MED ORDER — ASPIRIN 81 MG PO CHEW
324.0000 mg | CHEWABLE_TABLET | Freq: Once | ORAL | Status: AC
  Administered 2016-06-11: 324 mg via ORAL
  Filled 2016-06-11: qty 4

## 2016-06-11 MED ORDER — IOPAMIDOL (ISOVUE-370) INJECTION 76%
75.0000 mL | Freq: Once | INTRAVENOUS | Status: AC | PRN
  Administered 2016-06-11: 75 mL via INTRAVENOUS

## 2016-06-11 NOTE — ED Notes (Signed)
NAD. VSS. Pt has no needs at this time. Respirations unlabored. Remains on cardiac monitor. NSR with occasional PAC

## 2016-06-11 NOTE — Telephone Encounter (Signed)
Patient stated the pain is a dull ache that radiates to the back comes and goes X 2 days worse today advised patient that she should go to ER now that if she came here we would do EKG and she would still need to be evaluated at the ER that chest pain and pressure, with SOB needs evaluation now. Patient stated she will go now.

## 2016-06-11 NOTE — Telephone Encounter (Signed)
FYI

## 2016-06-11 NOTE — ED Provider Notes (Signed)
[facility] Emergency Department Provider Note  ____________________________________________  Time seen: Approximately 12:20 PM  I have reviewed the triage vital signs and the nursing notes.   HISTORY  Chief Complaint Chest Pain   HPI Erin [name] is a 83 y.o. female with h/o HTN and HLD who presents for evaluation of CP. Patient reports a constant left-sided dull pain radiating to her left arm. It has been present for 2 days. She reports that last night while laying flat the pain was significantly worse and she was unable to fall asleep. Today she called her PCP who recommended she come into the ED for evaluation. She reports that the pain fluctuates in intensity but has been constant. Currently is very mild. Patient denies worsening with exertion and reports that she was able to walk 2 miles this morning without any discomfort. She denies diaphoresis, shortness of breath, nausea or vomiting associated with this pain. She denies fever, cough, congestion or URI symptoms. She denies personal or family history of ischemic heart disease, blood clots, recent travel or immobilization, leg pain or swelling, hemoptysis, or exogenous hormones. Patient reports that she had similar pain many years ago in the setting of pneumonia.  Past Medical History:  Diagnosis Date  . History of colon polyps   . Hypercholesterolemia   . Hypertension     Patient Active Problem List   Diagnosis Date Noted  . Left arm numbness 06/02/2015  . Weight loss 01/22/2015  . Health care maintenance 10/27/2014  . History of melanoma 10/27/2014  . Stress 06/24/2014  . Leukopenia 02/25/2014  . Abnormal mammogram 11/11/2013  . Left knee pain 03/16/2013  . Hyperglycemia 05/31/2012  . History of colonic polyps 05/31/2012  . Hypertension 12/28/2011  . Hypercholesterolemia 12/28/2011    Past Surgical History:  Procedure Laterality Date  . APPENDECTOMY  1946  . CATARACT EXTRACTION  Bilateral   . DILATION AND CURETTAGE OF UTERUS    . LAMINECTOMY  2000  . release of foraminal stenosis  2003    Prior to Admission medications   Medication Sig Start Date End Date Taking? Authorizing Provider  aspirin 81 MG tablet Take 81 mg by mouth daily.   Yes Historical Provider, MD  benazepril (LOTENSIN) 40 MG tablet Take 1 tablet (40 mg total) by mouth 2 (two) times daily. 07/25/15  Yes Einar Pheasant, MD  Calcium Carbonate-Vitamin D (CALCIUM 600/VITAMIN D) 600-400 MG-UNIT per tablet Take 1 tablet by mouth daily.   Yes Historical Provider, MD  felodipine (PLENDIL) 2.5 MG 24 hr tablet Take 1 tablet (2.5 mg total) by mouth daily. 04/03/16  Yes Einar Pheasant, MD  furosemide (LASIX) 20 MG tablet Take 1 tablet (20 mg total) by mouth daily. 07/25/15  Yes Einar Pheasant, MD  Multiple Vitamin (MULTIVITAMIN) tablet Take 1 tablet by mouth daily.   Yes Historical Provider, MD  niacin 500 MG tablet Take 1,500 mg by mouth at bedtime.    Yes Historical Provider, MD  Omega-3 Fatty Acids (FISH OIL) 1000 MG CAPS Take by mouth daily.   Yes Historical Provider, MD  Red Yeast Rice Extract (RED YEAST RICE PO) Take 2 tablets by mouth.   Yes Historical Provider, MD    Allergies Erythromycin and Minocin [minocycline hcl]  Family History  Problem Relation Age of Onset  . Breast cancer Maternal Aunt   . Hypertension Mother   . Hypertension Sister   . Heart disease Brother     S/P CABG  . Multiple sclerosis Sister  died 43    Social History Social History  Substance Use Topics  . Smoking status: Former Research scientist (life sciences)  . Smokeless tobacco: Never Used  . Alcohol use 0.0 oz/week     Comment: rare    Review of Systems  Constitutional: Negative for fever. Eyes: Negative for visual changes. ENT: Negative for sore throat. Neck: No neck pain  Cardiovascular: + chest pain. Respiratory: Negative for shortness of breath. Gastrointestinal: Negative for abdominal pain, vomiting or  diarrhea. Genitourinary: Negative for dysuria. Musculoskeletal: Negative for back pain. Skin: Negative for rash. Neurological: Negative for headaches, weakness or numbness. Psych: No SI or HI  ____________________________________________   PHYSICAL EXAM:  VITAL SIGNS: ED Triage Vitals  Enc Vitals Group     BP 06/11/16 1129 (!) 152/74     Pulse Rate 06/11/16 1129 98     Resp 06/11/16 1129 20     Temp 06/11/16 1129 98.8 F (37.1 C)     Temp Source 06/11/16 1129 Oral     SpO2 06/11/16 1129 96 %     Weight 06/11/16 1125 128 lb (58.1 kg)     Height 06/11/16 1125 4\' 11"  (1.499 m)     Head Circumference --      Peak Flow --      Pain Score 06/11/16 1124 3     Pain Loc --      Pain Edu? --      Excl. in [place]? --     Constitutional: Alert and oriented. Well appearing and in no apparent distress. HEENT:      Head: Normocephalic and atraumatic.         Eyes: Conjunctivae are normal. Sclera is non-icteric. EOMI. PERRL      Mouth/Throat: Mucous membranes are moist.       Neck: Supple with no signs of meningismus. Cardiovascular: Regular rate and rhythm. No murmurs, gallops, or rubs. 2+ symmetrical distal pulses are present in all extremities. No JVD. Respiratory: Normal respiratory effort. Lungs are clear to auscultation bilaterally. No wheezes, crackles, or rhonchi.  Gastrointestinal: Soft, non tender, and non distended with positive bowel sounds. No rebound or guarding. Genitourinary: No CVA tenderness. Musculoskeletal: Nontender with normal range of motion in all extremities. No edema, cyanosis, or erythema of extremities. Neurologic: Normal speech and language. Face is symmetric. Moving all extremities. No gross focal neurologic deficits are appreciated. Skin: Skin is warm, dry and intact. No rash noted. Psychiatric: Mood and affect are normal. Speech and behavior are normal.  ____________________________________________   LABS (all labs ordered are listed, but only abnormal  results are displayed)  Labs Reviewed  BASIC METABOLIC PANEL - Abnormal; Notable for the following:       Result Value   Glucose, Bld 100 (*)    All other components within normal limits  CBC  TROPONIN I  TROPONIN I   ____________________________________________  EKG  ED ECG REPORT I, Rudene Re, the attending physician, personally viewed and interpreted this ECG.  Sinus tachycardia, rate of 110, normal intervals, normal axis, no ST elevations or depressions.  ____________________________________________  RADIOLOGY  CXR: 1. Hyperinflation without acute findings. 2. Probable ascending thoracic aortic aneurysm. CT chest could be used to further evaluate, as clinically warranted.  CTA: 1. Negative for aortic dissection. Ectatic, tortuous thoracic aorta with borderline aneurysmal ascending aorta measuring 38-39 mm diameter. Other aortic segments are normal aside from calcified atherosclerosis. 2. Calcified coronary artery atherosclerosis. Calcified iliofemoral atherosclerosis. 3. Patent central pulmonary arteries. 4. Normal lung parenchyma. No acute or  inflammatory process identified in the abdomen or pelvis. 5. Spinal scoliosis, spondylolisthesis, and degeneration.  ____________________________________________   PROCEDURES  Procedure(s) performed: None Procedures Critical Care performed:  None ____________________________________________   INITIAL IMPRESSION / ASSESSMENT AND PLAN / ED COURSE   83 y.o. female with h/o HTN and HLD who presents for evaluation of 2 days of constant dull left sided CP. Patient is extremely well appearing, no distress, she has normal vital signs, lungs are clear to auscultation, heart regular rate and rhythm with no murmurs, equal strong pulses in all 4 extremities. Low suspicion for ACS based on history and normal EKG however we'll cycle cardiac enzymes due to patient's risk factors and age. Chest x-ray concerning for an enlarged  mediastinum which is old when compared to prior however patient has never had a CT of her chest therefore will pursue CT to evaluate for dissection vs PE. Will monitor on telemetry.   Clinical Course as of Jun 11 1516  Thu Jun 11, 2016  1516 Patient remains chest pain-free. Troponin 2 is negative. CT showing borderline aneurysmal ascending aorta with no evidence of dissection, no PE. Patient remains CP free. Discussed findings of CT with patient and recommended close f/u with PCP for further management of CP and monitoring of aortic aneurysm. Patient comfortable with plan. Will dc home  [CV]    Clinical Course User Index [CV] Rudene Re, MD    Pertinent labs & imaging results that were available during my care of the patient were reviewed by me and considered in my medical decision making (see chart for details).    ____________________________________________   FINAL CLINICAL IMPRESSION(S) / ED DIAGNOSES  Final diagnoses:  Chest pain, unspecified type      NEW MEDICATIONS STARTED DURING THIS VISIT:  New Prescriptions   No medications on file     Note:  This document was prepared using Dragon voice recognition software and may include unintentional dictation errors.    Rudene Re, MD 06/11/16 [id number]

## 2016-06-11 NOTE — Discharge Instructions (Addendum)
You were seen for chest pain. Your workup today was reassuring. As I explained to you that does not mean that you do not have heart disease. You may need further evaluation to ensure you do not have a serious heart problem. Therefore it is imperative that you follow up with your doctor in 1-2 days for further evaluation.   Your CT showed a slight enlargement of your aorta which will need to be followed by your doctor. Make sure to tell your doctor that on your upcoming appointment.  When should you call for help?  Call 911 if: You passed out (lost consciousness) or if you feel dizzy. You have difficulty breathing. You have symptoms of a heart attack. These may include: Chest pain or pressure, or a strange feeling in your chest. Indigestion. Sweating. Shortness of breath. Nausea or vomiting. Pain, pressure, or a strange feeling in your back, neck, jaw, or upper belly or in one or both shoulders or arms. Lightheadedness or sudden weakness. A fast or irregular heartbeat. After you call 911, the operator may tell you to chew 1 adult-strength or 2 to 4 low-dose aspirin. Wait for an ambulance. Do not try to drive yourself.   Call your doctor today if: You have any trouble breathing. Your chest pain gets worse. You are dizzy or lightheaded, or you feel like you may faint. You are not getting better as expected. You are having new or different chest pain  How can you care for yourself at home? Rest until you feel better. Take your medicine exactly as prescribed. Call your doctor if you think you are having a problem with your medicine. Do not drive after taking a prescription pain medicine.

## 2016-06-11 NOTE — Telephone Encounter (Signed)
Patient Name: [name] DOB: 02-01-1931 Initial Comment caller states she is having pains in her chest off an on an she is currently having it now. Dosnet bother when she walks. Its just very uncomfortable Nurse Assessment Nurse: Ronnald Ramp, RN, Miranda Date/Time (Eastern Time): 06/11/2016 10:24:18 AM Confirm and document reason for call. If symptomatic, describe symptoms. ---Caller states she has been having chest pain for the last 2 days. It is constant but worse at times. Does the patient have any new or worsening symptoms? ---Yes Will a triage be completed? ---Yes Related visit to physician within the last 2 weeks? ---No Does the PT have any chronic conditions? (i.e. diabetes, asthma, etc.) ---Yes List chronic conditions. ---HTN, High Cholesterol Is this a behavioral health or substance abuse call? ---No Guidelines Guideline Title Affirmed Question Affirmed Notes Chest Pain [1] Chest pain lasts > 5 minutes AND [2] age > 12 Final Disposition User Call EMS 911 Now Ronnald Ramp, RN, Miranda Referrals [facility] REFUSED Disagree/Comply: Disagree Disagree/Comply Reason: Disagree with instructions

## 2016-06-11 NOTE — Telephone Encounter (Signed)
Louie Casa from [facility] triage called and stated that pt has denied 911. Please advise, thank you!

## 2016-06-11 NOTE — Telephone Encounter (Signed)
Patient in ER

## 2016-06-11 NOTE — Telephone Encounter (Signed)
Agree with evaluation now.  Please call later and check on her.  Thanks

## 2016-06-11 NOTE — ED Triage Notes (Signed)
Patient to ER for c/o midsternal chest pain. Patient states she is normally very healthy, had normal check up in early March. Developed chest pain 2 days ago. Denies any other symptoms. States pain is a dull ache, similar to how chest has felt with previous PNA episode. Denies any other symptoms (fevers, malaise, etc) related to PNA.

## 2016-06-11 NOTE — ED Notes (Addendum)
Care assumed for pt, report received

## 2016-06-11 NOTE — Telephone Encounter (Signed)
Patient currently  has pain in her chest, for two days off and on . No other symptom to report.  Pt contact [phone]  *Patient transferred to Nurse Line

## 2016-06-15 ENCOUNTER — Telehealth: Payer: Self-pay | Admitting: *Deleted

## 2016-06-15 NOTE — Telephone Encounter (Signed)
Please advise where to schedule. Thank you

## 2016-06-15 NOTE — Telephone Encounter (Signed)
Patient has requested a office visit with Dr. Nicki Reaper. She was seen at [facility] on 03/29 . Please give a time and date to place pt.  Pt contact [phone]

## 2016-06-15 NOTE — Telephone Encounter (Signed)
Patient was seen in ED for chest pain and she is fine with waiting until tomorrow at 8 to see PCP. Denies any discomfort at this time.

## 2016-06-15 NOTE — Telephone Encounter (Signed)
Please confirm with pt if ok.  I can see her tomorrow at 8:00 am.  Is she ok to wait until then.

## 2016-06-16 ENCOUNTER — Encounter: Payer: Self-pay | Admitting: Internal Medicine

## 2016-06-16 ENCOUNTER — Ambulatory Visit (INDEPENDENT_AMBULATORY_CARE_PROVIDER_SITE_OTHER): Payer: PPO

## 2016-06-16 ENCOUNTER — Ambulatory Visit (INDEPENDENT_AMBULATORY_CARE_PROVIDER_SITE_OTHER): Payer: PPO | Admitting: Internal Medicine

## 2016-06-16 VITALS — BP 138/78 | HR 67 | Temp 98.6°F | Resp 16 | Wt 128.0 lb

## 2016-06-16 DIAGNOSIS — R079 Chest pain, unspecified: Secondary | ICD-10-CM | POA: Diagnosis not present

## 2016-06-16 DIAGNOSIS — M25512 Pain in left shoulder: Secondary | ICD-10-CM

## 2016-06-16 DIAGNOSIS — M79602 Pain in left arm: Secondary | ICD-10-CM | POA: Diagnosis not present

## 2016-06-16 DIAGNOSIS — I10 Essential (primary) hypertension: Secondary | ICD-10-CM | POA: Diagnosis not present

## 2016-06-16 DIAGNOSIS — E7800 Pure hypercholesterolemia, unspecified: Secondary | ICD-10-CM

## 2016-06-16 DIAGNOSIS — M50323 Other cervical disc degeneration at C6-C7 level: Secondary | ICD-10-CM | POA: Diagnosis not present

## 2016-06-16 LAB — SEDIMENTATION RATE: Sed Rate: 3 mm/hr (ref 0–30)

## 2016-06-16 MED ORDER — FUROSEMIDE 20 MG PO TABS: 20.0000 mg | ORAL_TABLET | Freq: Every day | ORAL | 11 refills | Status: DC

## 2016-06-16 MED ORDER — BENAZEPRIL HCL 40 MG PO TABS: ORAL_TABLET | ORAL | 11 refills | Status: DC

## 2016-06-16 NOTE — Progress Notes (Signed)
Pre-visit discussion using our clinic review tool. No additional management support is needed unless otherwise documented below in the visit note.  

## 2016-06-16 NOTE — Progress Notes (Signed)
Patient ID: Erin [name], female   DOB: May 10, 1930, 83 y.o.   MRN: [id number]   Subjective:    Patient ID: Erin [name], female    DOB: Jun 21, 1930, 83 y.o.   MRN: [id number]  HPI  Patient here for an ER follow up.  Presented to ER on 06/11/16 with chest pain.  Symptoms started two days prior to ER visit.  ER w/up included normal EKG per repor.  Negative troponins.  CTA negative for aortic dissection.  Ectatic, tortuous thoracic aorta with borderline aneurysmal ascending aorta.  Normal lung parenchyma.  She was discharged to f/u as outpatient.  She is still having chest pain.  States since it has started, has been constant.  Worse at times.  No sob.  Worse with movement.  She is still walking and reports no change in the pain with exercise.  Pain over left anterior chest/breast, upper back, arm and left axilla.  No rash.  No fever.  No cough or congestion.  Eating and drinking well.  No nausea or vomiting.  Bowels moving.  Not able to sleep well secondary to pain.  Has taken some tylenol with some relief.     Past Medical History:  Diagnosis Date  . History of colon polyps   . Hypercholesterolemia   . Hypertension    Past Surgical History:  Procedure Laterality Date  . APPENDECTOMY  1946  . CATARACT EXTRACTION Bilateral   . DILATION AND CURETTAGE OF UTERUS    . LAMINECTOMY  2000  . release of foraminal stenosis  2003   Family History  Problem Relation Age of Onset  . Breast cancer Maternal Aunt   . Hypertension Mother   . Hypertension Sister   . Heart disease Brother     S/P CABG  . Multiple sclerosis Sister     died 32   Social History   Social History  . Marital status: Widowed    Spouse name: N/A  . Number of children: 4  . Years of education: N/A   Occupational History  . retired    Social History Main Topics  . Smoking status: Former Research scientist (life sciences)  . Smokeless tobacco: Never Used  . Alcohol use 0.0 oz/week     Comment: rare  . Drug use: No  . Sexual activity: No    Other Topics Concern  . None   Social History Narrative  . None    Outpatient Encounter Prescriptions as of 06/16/2016  Medication Sig  . aspirin 81 MG tablet Take 81 mg by mouth daily.  . benazepril (LOTENSIN) 40 MG tablet Take 1 tablet (40 mg total) by mouth 2 (two) times daily.  . Calcium Carbonate-Vitamin D (CALCIUM 600/VITAMIN D) 600-400 MG-UNIT per tablet Take 1 tablet by mouth daily.  . felodipine (PLENDIL) 2.5 MG 24 hr tablet Take 1 tablet (2.5 mg total) by mouth daily.  . furosemide (LASIX) 20 MG tablet Take 1 tablet (20 mg total) by mouth daily.  . Multiple Vitamin (MULTIVITAMIN) tablet Take 1 tablet by mouth daily.  . niacin 500 MG tablet Take 1,500 mg by mouth at bedtime.   . Omega-3 Fatty Acids (FISH OIL) 1000 MG CAPS Take by mouth daily.  . Red Yeast Rice Extract (RED YEAST RICE PO) Take 2 tablets by mouth.  . [DISCONTINUED] benazepril (LOTENSIN) 40 MG tablet Take 1 tablet (40 mg total) by mouth 2 (two) times daily.  . [DISCONTINUED] furosemide (LASIX) 20 MG tablet Take 1 tablet (20 mg total) by mouth daily.  No facility-administered encounter medications on file as of 06/16/2016.     Review of Systems  Constitutional: Negative for appetite change and unexpected weight change.  HENT: Negative for congestion and sinus pressure.   Respiratory: Negative for cough, chest tightness and shortness of breath.   Cardiovascular: Positive for chest pain. Negative for palpitations and leg swelling.  Gastrointestinal: Negative for abdominal pain, diarrhea, nausea and vomiting.  Genitourinary: Negative for difficulty urinating and dysuria.  Musculoskeletal:       Left axilla pain.  Also pain left shoulder and arm.   Skin: Negative for color change and rash.  Neurological: Negative for dizziness, light-headedness and headaches.  Psychiatric/Behavioral: Negative for agitation and dysphoric mood.       Objective:     Blood pressure rechecked by me:  138/78  Physical Exam   Constitutional: She appears well-developed and well-nourished. No distress.  HENT:  Nose: Nose normal.  Mouth/Throat: Oropharynx is clear and moist.  Neck: Neck supple.  Cardiovascular: Normal rate and regular rhythm.   Pulmonary/Chest: Breath sounds normal. No respiratory distress. She has no wheezes.  Abdominal: Soft. Bowel sounds are normal. There is no tenderness.  Musculoskeletal: She exhibits no edema.  Increased tenderness to palpation over the left anterior chest and increased pain with raising her left arm.  Some pain with adduction as well.  No significant neck pain with turning her head.    Lymphadenopathy:    She has no cervical adenopathy.  Skin: No rash noted. No erythema.  Psychiatric: She has a normal mood and affect. Her behavior is normal.    BP 138/78   Pulse 67   Temp 98.6 F (37 C) (Oral)   Resp 16   Wt 128 lb (58.1 kg)   SpO2 96%   BMI 25.85 kg/m  Wt Readings from Last 3 Encounters:  06/16/16 128 lb (58.1 kg)  06/11/16 128 lb (58.1 kg)  05/22/16 128 lb 12.8 oz (58.4 kg)     Lab Results  Component Value Date   WBC 4.7 06/11/2016   HGB 14.0 06/11/2016   HCT 39.9 06/11/2016   PLT 215 06/11/2016   GLUCOSE 100 (H) 06/11/2016   CHOL 255 (H) 03/31/2016   TRIG 134.0 03/31/2016   HDL 93.50 03/31/2016   LDLCALC 134 (H) 03/31/2016   ALT 12 03/31/2016   AST 16 03/31/2016   NA 138 06/11/2016   K 4.3 06/11/2016   CL 102 06/11/2016   CREATININE 0.83 06/11/2016   BUN 15 06/11/2016   CO2 29 06/11/2016   TSH 0.91 11/11/2015   HGBA1C 5.4 04/03/2016    Dg Chest 2 View  Result Date: 06/11/2016 CLINICAL DATA:  Chest pain EXAM: CHEST  2 VIEW COMPARISON:  07/29/2015 FINDINGS: Hyperinflation. The lungs are clear wiithout focal pneumonia, edema, pneumothorax or pleural effusion. Cardiopericardial silhouette is at upper limits of normal for size. Prominent right mediastinum, likely related to ascending aorta, as seen on multiple prior studies back to 05/07/2006.  Ascending aorta measured 3.9 cm maximum diameter on the CT scan from 05/07/2006. The visualized bony structures of the thorax are intact. IMPRESSION: 1. Hyperinflation without acute findings. 2. Probable ascending thoracic aortic aneurysm. CT chest could be used to further evaluate, as clinically warranted. Electronically Signed   By: Misty Stanley M.D.   On: 06/11/2016 12:16   Ct Angio Chest/abd/pel For Dissection W And/or Wo Contrast  Result Date: 06/11/2016 CLINICAL DATA:  83 year old female with midsternal chest pain for 2 days. EXAM: CT ANGIOGRAPHY CHEST, ABDOMEN  AND PELVIS TECHNIQUE: Multidetector CT imaging through the chest, abdomen and pelvis was performed using the standard protocol during bolus administration of intravenous contrast. Multiplanar reconstructed images and MIPs were obtained and reviewed to evaluate the vascular anatomy. CONTRAST:  75 mL Isovue 370 COMPARISON:  Chest radiographs 1153 hours today. Chest CT 05/07/2006 FINDINGS: CTA CHEST FINDINGS CARDIOVASCULAR Persistent left side SVC (normal variant). Calcified coronary artery atherosclerosis. Chronic tortuosity of the thoracic aorta and proximal great vessels. 38-39 mm diameter ascending aorta. The arch and descending aortic caliber are within normal limits. Mild calcified plaque in the arch. Mild to moderate soft and calcified plaque in the descending thoracic aorta. No thoracic aorta dissection. Negative visualized proximal great vessels aside from tortuosity. The central pulmonary arteries are also opacified and appear normal. No pericardial effusion. Mediastinum/Nodes: No mediastinal or hilar lymphadenopathy. 19 mm hypodense left thyroid nodule noted but was present, albeit smaller, in 2008 and significance is doubtful. No axillary lymphadenopathy. Lungs/Pleura: Major airways are patent. Improved lung volumes compared to 2008. Both lungs are clear. No pleural effusion. Musculoskeletal: Chronic lower cervical, upper thoracic, and  lower thoracic disc and endplate degeneration with progression since 2008. Intermittent vacuum disc, endplate sclerosis, and endplate spurring. No thoracic compression fracture identified. Chronic fracture of the left lateral third rib is unchanged since 2008. No acute osseous abnormality identified. Review of the MIP images confirms the above findings. CTA ABDOMEN AND PELVIS FINDINGS VASCULAR Aortoiliac calcified atherosclerosis. No dissection. No abdominal aortic aneurysm. Femoral artery soft and calcified plaque greater on the left the major branches of the abdominal aorta are relatively spared of atherosclerosis. Major arterial structures in the abdomen and pelvis are patent. Review of the MIP images confirms the above findings. NON-VASCULAR Hepatobiliary: Negative. Pancreas: Negative. Spleen: Negative. Adrenals/Urinary Tract: Chronic adrenal gland thickening compatible with adrenal hyperplasia. Bilateral renal enhancement is within normal limits, with a small midpole benign-appearing cortical cyst on the left. Right extrarenal pelvis, and left posteriorly directed dilated renal pelvis which then rapidly tapers to the left ureter. Both ureters appear within normal limits. Unremarkable urinary bladder. No abdominal free air or free fluid. Stomach/Bowel: Redundant sigmoid colon. Decompressed rectosigmoid. Decompressed left colon. Intermittent gas-filled transverse colon. Redundant flexures. The cecum is located in the right hemipelvis and mildly distended with stool. Otherwise negative right colon. No pericecal inflammation. The appendix is diminutive or absent. Negative terminal ileum. No dilated small bowel. Mild distention of the stomach. Duodenum is within normal limits. Lymphatic: No lymphadenopathy. Reproductive: Negative. Other: No pelvic free fluid. Musculoskeletal: Levoconvex lumbar scoliosis with widespread advanced disc and endplate degeneration. Mild to moderate upper lumbar spondylolisthesis. Moderate  to severe widespread lumbar facet degeneration. Chronic left pubic symphysis and left pubic ramus fractures. No acute osseous abnormality identified. Review of the MIP images confirms the above findings. IMPRESSION: 1. Negative for aortic dissection. Ectatic, tortuous thoracic aorta with borderline aneurysmal ascending aorta measuring 38-39 mm diameter. Other aortic segments are normal aside from calcified atherosclerosis. 2. Calcified coronary artery atherosclerosis. Calcified iliofemoral atherosclerosis. 3. Patent central pulmonary arteries. 4. Normal lung parenchyma. No acute or inflammatory process identified in the abdomen or pelvis. 5. Spinal scoliosis, spondylolisthesis, and degeneration. Electronically Signed   By: Genevie Ann M.D.   On: 06/11/2016 13:37       Assessment & Plan:   Problem List Items Addressed This Visit    Chest pain    Presented to ER with chest pain.  W/up there unrevealing.  CTA as outlined.  EKG and troponins negative.  Constant pain.  Some reproducible pain on exam with movement of her arm and palpation.  She declined f/u EKG.  Did discuss further cardiac w/up as ER had recommended.  Will refer to cardiology for further evaluation and question of need for further cardiac w/up.  Pain appears to be more msk on exam as outlined.  Will check c-spine and left shoulder xray.  Treat with tylenol and antiinflammatory as discussed with pt.  Further w/up pending results.    Addendum.  C-spine reviewed and as outlined.  Discussed with pt.  Treat with medrol dose pack as directed.  Will obtain MRI c-spine.        Relevant Orders   Ambulatory referral to Cardiology   Hypercholesterolemia    Low cholesterol diet and exercise.  Follow lipid panel.        Relevant Medications   furosemide (LASIX) 20 MG tablet   benazepril (LOTENSIN) 40 MG tablet   Hypertension    Blood pressure under good control.  Continue same medication regimen.  Follow pressures.  Follow metabolic panel.          Relevant Medications   furosemide (LASIX) 20 MG tablet   benazepril (LOTENSIN) 40 MG tablet    Other Visit Diagnoses    Left arm pain    -  Primary   Relevant Orders   Sedimentation rate (Completed)   DG Cervical Spine 2 or 3 views (Completed)   DG Shoulder Left (Completed)   Acute pain of left shoulder       Relevant Orders   DG Cervical Spine 2 or 3 views (Completed)   DG Shoulder Left (Completed)       Einar Pheasant, MD

## 2016-06-17 ENCOUNTER — Other Ambulatory Visit: Payer: Self-pay | Admitting: Internal Medicine

## 2016-06-17 DIAGNOSIS — M25512 Pain in left shoulder: Secondary | ICD-10-CM

## 2016-06-17 DIAGNOSIS — R937 Abnormal findings on diagnostic imaging of other parts of musculoskeletal system: Secondary | ICD-10-CM

## 2016-06-17 DIAGNOSIS — M79602 Pain in left arm: Secondary | ICD-10-CM

## 2016-06-17 MED ORDER — METHYLPREDNISOLONE 4 MG PO TBPK: ORAL_TABLET | ORAL | 0 refills | Status: DC

## 2016-06-17 NOTE — Progress Notes (Signed)
rx sent in for medrol dose pack 6 day taper.

## 2016-06-17 NOTE — Progress Notes (Signed)
Order placed for MRI c-spine.  

## 2016-06-18 ENCOUNTER — Encounter: Payer: Self-pay | Admitting: Internal Medicine

## 2016-06-18 DIAGNOSIS — R079 Chest pain, unspecified: Secondary | ICD-10-CM

## 2016-06-18 DIAGNOSIS — R0789 Other chest pain: Secondary | ICD-10-CM | POA: Insufficient documentation

## 2016-06-18 NOTE — Assessment & Plan Note (Signed)
Blood pressure under good control.  Continue same medication regimen.  Follow pressures.  Follow metabolic panel.   

## 2016-06-18 NOTE — Assessment & Plan Note (Signed)
Presented to ER with chest pain.  W/up there unrevealing.  CTA as outlined.  EKG and troponins negative.  Constant pain.  Some reproducible pain on exam with movement of her arm and palpation.  She declined f/u EKG.  Did discuss further cardiac w/up as ER had recommended.  Will refer to cardiology for further evaluation and question of need for further cardiac w/up.  Pain appears to be more msk on exam as outlined.  Will check c-spine and left shoulder xray.  Treat with tylenol and antiinflammatory as discussed with pt.  Further w/up pending results.    Addendum.  C-spine reviewed and as outlined.  Discussed with pt.  Treat with medrol dose pack as directed.  Will obtain MRI c-spine.

## 2016-06-18 NOTE — Assessment & Plan Note (Signed)
Low cholesterol diet and exercise.  Follow lipid panel.   

## 2016-06-30 ENCOUNTER — Ambulatory Visit
Admission: RE | Admit: 2016-06-30 | Discharge: 2016-06-30 | Disposition: A | Discharge status: Home / Self Care | Payer: PPO | Source: Ambulatory Visit | Attending: Internal Medicine | Admitting: Internal Medicine

## 2016-06-30 DIAGNOSIS — M542 Cervicalgia: Secondary | ICD-10-CM | POA: Diagnosis not present

## 2016-06-30 DIAGNOSIS — M79602 Pain in left arm: Secondary | ICD-10-CM | POA: Diagnosis not present

## 2016-06-30 DIAGNOSIS — R937 Abnormal findings on diagnostic imaging of other parts of musculoskeletal system: Secondary | ICD-10-CM | POA: Diagnosis not present

## 2016-06-30 DIAGNOSIS — M5125 Other intervertebral disc displacement, thoracolumbar region: Secondary | ICD-10-CM | POA: Diagnosis not present

## 2016-06-30 DIAGNOSIS — M47812 Spondylosis without myelopathy or radiculopathy, cervical region: Secondary | ICD-10-CM | POA: Insufficient documentation

## 2016-06-30 DIAGNOSIS — M25512 Pain in left shoulder: Secondary | ICD-10-CM | POA: Diagnosis not present

## 2016-06-30 DIAGNOSIS — M5124 Other intervertebral disc displacement, thoracic region: Secondary | ICD-10-CM | POA: Insufficient documentation

## 2016-07-01 ENCOUNTER — Telehealth: Payer: Self-pay | Admitting: *Deleted

## 2016-07-01 NOTE — Telephone Encounter (Signed)
Called patient let know I will put in mail today

## 2016-07-01 NOTE — Telephone Encounter (Signed)
Patient has requested to have her MRI results mailed to her .

## 2016-07-02 ENCOUNTER — Other Ambulatory Visit: Payer: Self-pay | Admitting: Internal Medicine

## 2016-07-02 DIAGNOSIS — R937 Abnormal findings on diagnostic imaging of other parts of musculoskeletal system: Secondary | ICD-10-CM

## 2016-07-02 NOTE — Progress Notes (Signed)
Order placed for neurosurgery referral 

## 2016-07-07 ENCOUNTER — Ambulatory Visit (INDEPENDENT_AMBULATORY_CARE_PROVIDER_SITE_OTHER): Payer: PPO

## 2016-07-07 ENCOUNTER — Encounter: Payer: Self-pay | Admitting: Cardiology

## 2016-07-07 ENCOUNTER — Ambulatory Visit (INDEPENDENT_AMBULATORY_CARE_PROVIDER_SITE_OTHER): Payer: PPO | Admitting: Cardiology

## 2016-07-07 VITALS — BP 140/80 | HR 74 | Ht 59.0 in | Wt 126.8 lb

## 2016-07-07 DIAGNOSIS — R079 Chest pain, unspecified: Secondary | ICD-10-CM

## 2016-07-07 DIAGNOSIS — I491 Atrial premature depolarization: Secondary | ICD-10-CM

## 2016-07-07 DIAGNOSIS — R011 Cardiac murmur, unspecified: Secondary | ICD-10-CM | POA: Diagnosis not present

## 2016-07-07 DIAGNOSIS — I10 Essential (primary) hypertension: Secondary | ICD-10-CM | POA: Diagnosis not present

## 2016-07-07 NOTE — Progress Notes (Signed)
Cardiology Office Note   Date:  07/07/2016   ID:  Erin [name], DOB [birth year], MRN [id number]  Referring Doctor:  Einar Pheasant, MD   Cardiologist:   Wende Bushy, MD   Reason for consultation:  Chief Complaint  Patient presents with  . other    seen in ED for CP. PT c/o chest pain down back and down left arm, was told inflammation of spine.  Meds verbally reviewed with patient.       History of Present Illness: Erin [name] is a 83 y.o. female who is being seen today for the evaluation of chest pain at the request of Einar Pheasant, MD.  Chest pain started about 4 weeks ago. Gradual in onset, described as his discomfort sensation and a nagging sensation. The area involved is 30 left side of the chest going to the side under the left arm and down the left arm as well. The symptoms may be moderate to severe in intensity, 10 out of 10 at the worst, randomly worsens, not typically associated with exertion, no known precipitating or relieving factors. She was evaluated in the ER for this and was told by ER and by PCP that her symptoms may be related to cervical spine issues. She is supposed to see a neurosurgeon soon. She did not report significant relief with Aleve or Medrol.  Patient denies shortness of breath. No palpitations. No syncope.  Patient denies PND, orthopnea, edema.  ROS:  Please see the history of present illness. Aside from mentioned under HPI, all other systems are reviewed and negative.     Past Medical History:  Diagnosis Date  . History of colon polyps   . Hypercholesterolemia   . Hypertension     Past Surgical History:  Procedure Laterality Date  . APPENDECTOMY  1946  . CATARACT EXTRACTION Bilateral   . DILATION AND CURETTAGE OF UTERUS    . LAMINECTOMY  2000  . release of foraminal stenosis  2003     reports that she has quit smoking. She has never used smokeless tobacco. She reports that she drinks alcohol. She reports that she does not use  drugs.   family history includes Breast cancer in her maternal aunt; Heart disease in her brother; Hypertension in her mother and sister; Multiple sclerosis in her sister.   Outpatient Medications Prior to Visit  Medication Sig Dispense Refill  . aspirin 81 MG tablet Take 81 mg by mouth daily.    . benazepril (LOTENSIN) 40 MG tablet Take 1 tablet (40 mg total) by mouth 2 (two) times daily. 60 tablet 11  . Calcium Carbonate-Vitamin D (CALCIUM 600/VITAMIN D) 600-400 MG-UNIT per tablet Take 1 tablet by mouth daily.    . felodipine (PLENDIL) 2.5 MG 24 hr tablet Take 1 tablet (2.5 mg total) by mouth daily. 30 tablet 6  . furosemide (LASIX) 20 MG tablet Take 1 tablet (20 mg total) by mouth daily. 30 tablet 11  . methylPREDNISolone (MEDROL DOSEPAK) 4 MG TBPK tablet Medrol dose pack - 6 day taper.  Take as directed. 21 tablet 0  . Multiple Vitamin (MULTIVITAMIN) tablet Take 1 tablet by mouth daily.    . niacin 500 MG tablet Take 1,500 mg by mouth at bedtime.     . Omega-3 Fatty Acids (FISH OIL) 1000 MG CAPS Take by mouth daily.    . Red Yeast Rice Extract (RED YEAST RICE PO) Take 2 tablets by mouth.     No facility-administered medications prior to visit.  Allergies: Erythromycin and Minocin [minocycline hcl]    PHYSICAL EXAM: VS:  BP 140/80 (BP Location: Left Arm, Patient Position: Sitting, Cuff Size: Normal)   Pulse 74   Ht 4\' 11"  (1.499 m)   Wt 126 lb 12 oz (57.5 kg)   BMI 25.60 kg/m  , Body mass index is 25.6 kg/m. Wt Readings from Last 3 Encounters:  07/07/16 126 lb 12 oz (57.5 kg)  06/16/16 128 lb (58.1 kg)  06/11/16 128 lb (58.1 kg)    GENERAL:  well developed, well nourished, not in acute distress HEENT: normocephalic, pink conjunctivae, anicteric sclerae, no xanthelasma, normal dentition, oropharynx clear NECK:  no neck vein engorgement, JVP normal, no hepatojugular reflux, carotid upstroke brisk and symmetric, no bruit, no thyromegaly, no lymphadenopathy LUNGS:  good  respiratory effort, clear to auscultation bilaterally CV:  PMI not displaced, no thrills, no lifts, S1 and S2 within normal limits, no palpable S3 or S4, systolic murmur 0-1/0, non radiating, no rubs, no gallops, ectopy noted ABD:  Soft, nontender, nondistended, normoactive bowel sounds, no abdominal aortic bruit, no hepatomegaly, no splenomegaly MS: nontender back, no kyphosis, no scoliosis, no joint deformities EXT:  2+ DP/PT pulses, no edema, no varicosities, no cyanosis, no clubbing SKIN: warm, nondiaphoretic, normal turgor, no ulcers NEUROPSYCH: alert, oriented to person, place, and time, sensory/motor grossly intact, normal mood, appropriate affect  Recent Labs: 11/11/2015: TSH 0.91 03/31/2016: ALT 12 06/11/2016: BUN 15; Creatinine, Ser 0.83; Hemoglobin 14.0; Platelets 215; Potassium 4.3; Sodium 138   Lipid Panel    Component Value Date/Time   CHOL 255 (H) 03/31/2016 0838   TRIG 134.0 03/31/2016 0838   HDL 93.50 03/31/2016 0838   CHOLHDL 3 03/31/2016 0838   VLDL 26.8 03/31/2016 0838   LDLCALC 134 (H) 03/31/2016 2725     Other studies Reviewed:  EKG:  The ekg from 07/07/2016 was personally reviewed by me and it revealed sinus rhythm, 74 BPM. PACs.  Additional studies/ records that were reviewed personally reviewed by me today include: None available   ASSESSMENT AND PLAN:  Chest pain Risk factors for CAD include age, hypertension, hyperlipidemia Recommend further evaluation with pharmacologic nuclear stress test and echocardiogram.  Systolic murmur Echocardiogram recommended.  Premature atrial complexes Noted on EKG, as well as ectopy noted on physical examination Recommend further evaluation with monitor.  Hypertension BP is well controlled. Continue monitoring BP. Continue current medical therapy and lifestyle changes.   Current medicines are reviewed at length with the patient today.  The patient does not have concerns regarding medicines.  Labs/ tests ordered  today include:  Orders Placed This Encounter  Procedures  . NM Myocar Multi W/Spect W/Wall Motion / EF  . LONG TERM MONITOR (3-14 DAYS)  . EKG 12-Lead  . ECHOCARDIOGRAM COMPLETE    I had a lengthy and detailed discussion with the patient regarding diagnoses, prognosis, diagnostic options.  I counseled the patient on importance of lifestyle modification including heart healthy diet, regular physical activity  Once cardiac work up completed.   Disposition:   FU with Cardiology after tests   Thank you for this consultation. We will forwarding this consultation to referring physician.    Signed, Wende Bushy, MD  07/07/2016 9:59 AM    Power  This note was generated in part with voice recognition software and I apologize for any typographical errors that were not detected and corrected.

## 2016-07-07 NOTE — Patient Instructions (Addendum)
Testing/Procedures: Your physician has requested that you have an echocardiogram. Echocardiography is a painless test that uses sound waves to create images of your heart. It provides your doctor with information about the size and shape of your heart and how well your heart's chambers and valves are working. This procedure takes approximately one hour. There are no restrictions for this procedure.  Your physician has recommended that you wear an Zio monitor. Zio monitors are medical devices that record the heart's electrical activity. Doctors most often [place] these monitors to diagnose arrhythmias. Arrhythmias are problems with the speed or rhythm of the heartbeat. The monitor is a small, portable device. You can wear one while you do your normal daily activities. This is usually used to diagnose what is causing palpitations/syncope (passing out).   Wear Monitor for 3 days and then take off and mail back on Friday July 10, 2016    [place]  Your caregiver has ordered a Stress Test with nuclear imaging. The purpose of this test is to evaluate the blood supply to your heart muscle. This procedure is referred to as a "Non-Invasive Stress Test." This is because other than having an IV started in your vein, nothing is inserted or "invades" your body. Cardiac stress tests are done to find areas of poor blood flow to the heart by determining the extent of coronary artery disease (CAD). Some patients exercise on a treadmill, which naturally increases the blood flow to your heart, while others who are  unable to walk on a treadmill due to physical limitations have a pharmacologic/chemical stress agent called Lexiscan . This medicine will mimic walking on a treadmill by temporarily increasing your coronary blood flow.   Please note: these test may take anywhere between 2-4 hours to complete  PLEASE REPORT TO [place] AT THE FIRST DESK WILL DIRECT YOU WHERE TO GO  Date of  Procedure:_Monday April 30, 2018__  Arrival Time for Procedure:_Arrive at 08:45AM___   PLEASE NOTIFY THE OFFICE AT LEAST 24 HOURS IN ADVANCE IF YOU ARE UNABLE TO KEEP YOUR APPOINTMENT.  [phone] AND  PLEASE NOTIFY NUCLEAR MEDICINE AT [facility] AT LEAST 24 HOURS IN ADVANCE IF YOU ARE UNABLE TO KEEP YOUR APPOINTMENT. [phone]  How to prepare for your Myoview test:  1. Do not eat or drink after midnight 2. No caffeine for 24 hours prior to test 3. No smoking 24 hours prior to test. 4. Your medication may be taken with water.  If your doctor stopped a medication because of this test, do not take that medication. 5. Ladies, please do not wear dresses.  Skirts or pants are appropriate. Please wear a short sleeve shirt. 6. No perfume, cologne or lotion. 7. Wear comfortable walking shoes. No heels!   Follow-Up: Your physician recommends that you schedule a follow-up appointment as needed. We will call you with results and if needed schedule follow up at that time.    It was a pleasure seeing you today here in the office. Please do not hesitate to give [place] a call back if you have any further questions. [place], BSN     Echocardiogram An echocardiogram, or echocardiography, uses sound waves (ultrasound) to produce an image of your heart. The echocardiogram is simple, painless, obtained within a short period of time, and offers valuable information to your health care provider. The images from an echocardiogram can provide information such as:  Evidence of coronary artery disease (CAD).  Heart  size.  Heart muscle function.  Heart valve function.  Aneurysm detection.  Evidence of a past heart attack.  Fluid buildup around the heart.  Heart muscle thickening.  Assess heart valve function. Tell a health care provider about:  Any allergies you have.  All medicines you are taking, including vitamins, herbs, eye drops, creams, and over-the-counter  medicines.  Any problems you or family members have had with anesthetic medicines.  Any blood disorders you have.  Any surgeries you have had.  Any medical conditions you have.  Whether you are pregnant or may be pregnant. What happens before the procedure? No special preparation is needed. Eat and drink normally. What happens during the procedure?  In order to produce an image of your heart, gel will be applied to your chest and a wand-like tool (transducer) will be moved over your chest. The gel will help transmit the sound waves from the transducer. The sound waves will harmlessly bounce off your heart to allow the heart images to be captured in real-time motion. These images will then be recorded.  You may need an IV to receive a medicine that improves the quality of the pictures. What happens after the procedure? You may return to your normal schedule including diet, activities, and medicines, unless your health care provider tells you otherwise. This information is not intended to replace advice given to you by your health care provider. Make sure you discuss any questions you have with your health care provider. Document Released: 02/28/2000 Document Revised: 10/19/2015 Document Reviewed: 11/07/2012 Elsevier Interactive Patient Education  2017 [employer]. Pharmacologic Stress Electrocardiogram Introduction A pharmacologic stress electrocardiogram is a heart (cardiac) test that uses nuclear imaging to evaluate the blood supply to your heart. This test may also be called a pharmacologic stress electrocardiography. Pharmacologic means that a medicine is used to increase your heart rate and blood pressure. This stress test is done to find areas of poor blood flow to the heart by determining the extent of coronary artery disease (CAD). Some people exercise on a treadmill, which naturally increases the blood flow to the heart. For those people unable to exercise on a treadmill, a  medicine is used. This medicine stimulates your heart and will cause your heart to beat harder and more quickly, as if you were exercising. Pharmacologic stress tests can help determine:  The adequacy of blood flow to your heart during increased levels of activity in order to clear you for discharge home.  The extent of coronary artery blockage caused by CAD.  Your prognosis if you have suffered a heart attack.  The effectiveness of cardiac procedures done, such as an angioplasty, which can increase the circulation in your coronary arteries.  Causes of chest pain or pressure. LET [facility] CARE PROVIDER KNOW ABOUT:  Any allergies you have.  All medicines you are taking, including vitamins, herbs, eye drops, creams, and over-the-counter medicines.  Previous problems you or members of your family have had with the use of anesthetics.  Any blood disorders you have.  Previous surgeries you have had.  Medical conditions you have.  Possibility of pregnancy, if this applies.  If you are currently breastfeeding. RISKS AND COMPLICATIONS Generally, this is a safe procedure. However, as with any procedure, complications can occur. Possible complications include:  You develop pain or pressure in the following areas:  Chest.  Jaw or neck.  Between your shoulder blades.  Radiating down your left arm.  Headache.  Dizziness or light-headedness.  Shortness of  breath.  Increased or irregular heartbeat.  Low blood pressure.  Nausea or vomiting.  Flushing.  Redness going up the arm and slight pain during injection of medicine.  Heart attack (rare). BEFORE THE PROCEDURE  Avoid all forms of caffeine for 24 hours before your test or as directed by your health care provider. This includes coffee, tea (even decaffeinated tea), caffeinated sodas, chocolate, cocoa, and certain pain medicines.  Follow your health care provider's instructions regarding eating and drinking before  the test.  Take your medicines as directed at regular times with water unless instructed otherwise. Exceptions may include:  If you have diabetes, ask how you are to take your insulin or pills. It is common to adjust insulin dosing the morning of the test.  If you are taking beta-blocker medicines, it is important to talk to your health care provider about these medicines well before the date of your test. Taking beta-blocker medicines may interfere with the test. In some cases, these medicines need to be changed or stopped 24 hours or more before the test.  If you wear a nitroglycerin patch, it may need to be removed prior to the test. Ask your health care provider if the patch should be removed before the test.  If you use an inhaler for any breathing condition, bring it with you to the test.  If you are an outpatient, bring a snack so you can eat right after the stress phase of the test.  Do not smoke for 4 hours prior to the test or as directed by your health care provider.  Do not apply lotions, powders, creams, or oils on your chest prior to the test.  Wear comfortable shoes and clothing. Let your health care provider know if you were unable to complete or follow the preparations for your test. PROCEDURE  Multiple patches (electrodes) will be put on your chest. If needed, small areas of your chest may be shaved to get better contact with the electrodes. Once the electrodes are attached to your body, multiple wires will be attached to the electrodes, and your heart rate will be monitored.  An IV access will be started. A nuclear trace (isotope) is given. The isotope may be given intravenously, or it may be swallowed. Nuclear refers to several types of radioactive isotopes, and the nuclear isotope lights up the arteries so that the nuclear images are clear. The isotope is absorbed by your body. This results in low radiation exposure.  A resting nuclear image is taken to show how your  heart functions at rest.  A medicine is given through the IV access.  A second scan is done about 1 hour after the medicine injection and determines how your heart functions under stress.  During this stress phase, you will be connected to an electrocardiogram machine. Your blood pressure and oxygen levels will be monitored. What to expect after the procedure  Your heart rate and blood pressure will be monitored after the test.  You may return to your normal schedule, including diet,activities, and medicines, unless your health care provider tells you otherwise. This information is not intended to replace advice given to you by your health care provider. Make sure you discuss any questions you have with your health care provider. Document Released: 07/19/2008 Document Revised: 08/08/2015 Document Reviewed: 09/09/2015 Elsevier Interactive Patient Education  2017 [employer].

## 2016-07-10 DIAGNOSIS — R079 Chest pain, unspecified: Secondary | ICD-10-CM | POA: Diagnosis not present

## 2016-07-10 DIAGNOSIS — I491 Atrial premature depolarization: Secondary | ICD-10-CM

## 2016-07-13 ENCOUNTER — Encounter
Admission: RE | Admit: 2016-07-13 | Discharge: 2016-07-13 | Disposition: A | Discharge status: Home / Self Care | Payer: PPO | Source: Ambulatory Visit | Attending: Cardiology | Admitting: Cardiology

## 2016-07-13 DIAGNOSIS — R079 Chest pain, unspecified: Secondary | ICD-10-CM

## 2016-07-13 HISTORY — DX: Chest pain, unspecified: R07.9

## 2016-07-13 LAB — NM MYOCAR MULTI W/SPECT W/WALL MOTION / EF
CHL CUP RESTING HR STRESS: 60 {beats}/min
CHL CUP STRESS STAGE 1 GRADE: 0 %
CHL CUP STRESS STAGE 1 HR: 67 {beats}/min
CHL CUP STRESS STAGE 1 SPEED: 0 mph
CHL CUP STRESS STAGE 3 SBP: 148 mmHg
CHL CUP STRESS STAGE 4 GRADE: 0 %
CHL CUP STRESS STAGE 4 SPEED: 0 mph
CHL CUP STRESS STAGE 5 DBP: 92 mmHg
CHL CUP STRESS STAGE 5 SBP: 168 mmHg
CHL CUP STRESS STAGE 5 SPEED: 0 mph
CSEPEDS: 0 s
CSEPEW: 4.6 METS
Exercise duration (min): 4 min
LV dias vol: 43 mL (ref 46–106)
LV sys vol: 11 mL
Peak BP: 148 mmHg
Peak HR: 117 {beats}/min
Percent HR: 90 %
Percent of predicted max HR: 86 %
SDS: 2
SRS: 14
SSS: 5
Stage 2 Grade: 0 %
Stage 2 HR: 67 {beats}/min
Stage 2 Speed: 0 mph
Stage 3 DBP: 113 mmHg
Stage 3 Grade: 10 %
Stage 3 HR: 117 {beats}/min
Stage 3 Speed: 1.7 mph
Stage 4 HR: 105 {beats}/min
Stage 5 Grade: 0 %
Stage 5 HR: 70 {beats}/min

## 2016-07-13 MED ORDER — TECHNETIUM TC 99M TETROFOSMIN IV KIT
31.5700 | PACK | Freq: Once | INTRAVENOUS | Status: AC | PRN
  Administered 2016-07-13: 31.57 via INTRAVENOUS

## 2016-07-13 MED ORDER — TECHNETIUM TC 99M TETROFOSMIN IV KIT
12.4100 | PACK | Freq: Once | INTRAVENOUS | Status: AC | PRN
  Administered 2016-07-13: 12.41 via INTRAVENOUS

## 2016-07-14 DIAGNOSIS — M5412 Radiculopathy, cervical region: Secondary | ICD-10-CM | POA: Diagnosis not present

## 2016-07-14 DIAGNOSIS — M4802 Spinal stenosis, cervical region: Secondary | ICD-10-CM | POA: Diagnosis not present

## 2016-07-15 DIAGNOSIS — R079 Chest pain, unspecified: Secondary | ICD-10-CM | POA: Diagnosis not present

## 2016-07-16 ENCOUNTER — Telehealth: Payer: Self-pay | Admitting: Cardiology

## 2016-07-16 NOTE — Telephone Encounter (Signed)
See results note. 

## 2016-07-16 NOTE — Telephone Encounter (Signed)
Patient returning call re: monitor   Please call.

## 2016-07-21 DIAGNOSIS — M4722 Other spondylosis with radiculopathy, cervical region: Secondary | ICD-10-CM | POA: Diagnosis not present

## 2016-07-23 ENCOUNTER — Encounter: Payer: Self-pay | Admitting: Cardiology

## 2016-07-23 ENCOUNTER — Ambulatory Visit (INDEPENDENT_AMBULATORY_CARE_PROVIDER_SITE_OTHER): Payer: PPO | Admitting: Cardiology

## 2016-07-23 VITALS — BP 138/64 | HR 58 | Ht 60.0 in | Wt 128.5 lb

## 2016-07-23 DIAGNOSIS — R011 Cardiac murmur, unspecified: Secondary | ICD-10-CM

## 2016-07-23 DIAGNOSIS — I4891 Unspecified atrial fibrillation: Secondary | ICD-10-CM

## 2016-07-23 DIAGNOSIS — I10 Essential (primary) hypertension: Secondary | ICD-10-CM | POA: Diagnosis not present

## 2016-07-23 NOTE — Patient Instructions (Addendum)
Medication Instructions:  Eliquis 2.5 mg twice a day  Labwork: If you start on the Eliquis then we will need to have repeat blood work in 1 month.  Follow-Up: Your physician recommends that you schedule a follow-up appointment in: 1 month after labs have been done.  It was a pleasure seeing you today here in the office. Please do not hesitate to give [place] a call back if you have any further questions. [place], BSN    Apixaban oral tablets What is this medicine? APIXABAN (a PIX a ban) is an anticoagulant (blood thinner). It is used to lower the chance of stroke in people with a medical condition called atrial fibrillation. It is also used to treat or prevent blood clots in the lungs or in the veins. This medicine may be used for other purposes; ask your health care provider or pharmacist if you have questions. COMMON BRAND NAME(S): Eliquis What should I tell my health care provider before I take this medicine? They need to know if you have any of these conditions: -bleeding disorders -bleeding in the brain -blood in your stools (black or tarry stools) or if you have blood in your vomit -history of stomach bleeding -kidney disease -liver disease -mechanical heart valve -an unusual or allergic reaction to apixaban, other medicines, foods, dyes, or preservatives -pregnant or trying to get pregnant -breast-feeding How should I use this medicine? Take this medicine by mouth with a glass of water. Follow the directions on the prescription label. You can take it with or without food. If it upsets your stomach, take it with food. Take your medicine at regular intervals. Do not take it more often than directed. Do not stop taking except on your doctor's advice. Stopping this medicine may increase your risk of a blot clot. Be sure to refill your prescription before you run out of medicine. Talk to your pediatrician regarding the use of this medicine in children. Special care  may be needed. Overdosage: If you think you have taken too much of this medicine contact a poison control center or emergency room at once. NOTE: This medicine is only for you. Do not share this medicine with others. What if I miss a dose? If you miss a dose, take it as soon as you can. If it is almost time for your next dose, take only that dose. Do not take double or extra doses. What may interact with this medicine? This medicine may interact with the following: -aspirin and aspirin-like medicines -certain medicines for fungal infections like ketoconazole and itraconazole -certain medicines for seizures like carbamazepine and phenytoin -certain medicines that treat or prevent blood clots like warfarin, enoxaparin, and dalteparin -clarithromycin -NSAIDs, medicines for pain and inflammation, like ibuprofen or naproxen -rifampin -ritonavir -St. John's wort This list may not describe all possible interactions. Give your health care provider a list of all the medicines, herbs, non-prescription drugs, or dietary supplements you use. Also tell them if you smoke, drink alcohol, or use illegal drugs. Some items may interact with your medicine. What should I watch for while using this medicine? Visit your doctor or health care professional for regular checks on your progress. Notify your doctor or health care professional and seek emergency treatment if you develop breathing problems; changes in vision; chest pain; severe, sudden headache; pain, swelling, warmth in the leg; trouble speaking; sudden numbness or weakness of the face, arm or leg. These can be signs that your condition has gotten worse. If you  are going to have surgery or other procedure, tell your doctor that you are taking this medicine. What side effects may I notice from receiving this medicine? Side effects that you should report to your doctor or health care professional as soon as possible: -allergic reactions like skin rash,  itching or hives, swelling of the face, lips, or tongue -signs and symptoms of bleeding such as bloody or black, tarry stools; red or dark-brown urine; spitting up blood or brown material that looks like coffee grounds; red spots on the skin; unusual bruising or bleeding from the eye, gums, or nose This list may not describe all possible side effects. Call your doctor for medical advice about side effects. You may report side effects to FDA at 1-800-FDA-1088. Where should I keep my medicine? Keep out of the reach of children. Store at room temperature between 20 and 25 degrees C (68 and 77 degrees F). Throw away any unused medicine after the expiration date. NOTE: This sheet is a summary. It may not cover all possible information. If you have questions about this medicine, talk to your doctor, pharmacist, or health care provider.  2018 Elsevier/Gold Standard (2015-09-23 11:54:23)  Atrial Fibrillation Atrial fibrillation is a type of heartbeat that is irregular or fast (rapid). If you have this condition, your heart keeps quivering in a weird (chaotic) way. This condition can make it so your heart cannot pump blood normally. Having this condition gives a person more risk for stroke, heart failure, and other heart problems. There are different types of atrial fibrillation. Talk with your doctor to learn about the type that you have. Follow these instructions at home:  Take over-the-counter and prescription medicines only as told by your doctor.  If your doctor prescribed a blood-thinning medicine, take it exactly as told. Taking too much of it can cause bleeding. If you do not take enough of it, you will not have the protection that you need against stroke and other problems.  Do not use any tobacco products. These include cigarettes, chewing tobacco, and e-cigarettes. If you need help quitting, ask your doctor.  If you have apnea (obstructive sleep apnea), manage it as told by your doctor.  Do  not drink alcohol.  Do not drink beverages that have caffeine. These include coffee, soda, and tea.  Maintain a healthy weight. Do not use diet pills unless your doctor says they are safe for you. Diet pills may make heart problems worse.  Follow diet instructions as told by your doctor.  Exercise regularly as told by your doctor.  Keep all follow-up visits as told by your doctor. This is important. Contact a doctor if:  You notice a change in the speed, rhythm, or strength of your heartbeat.  You are taking a blood-thinning medicine and you notice more bruising.  You get tired more easily when you move or exercise. Get help right away if:  You have pain in your chest or your belly (abdomen).  You have sweating or weakness.  You feel sick to your stomach (nauseous).  You notice blood in your throw up (vomit), poop (stool), or pee (urine).  You are short of breath.  You suddenly have swollen feet and ankles.  You feel dizzy.  Your suddenly get weak or numb in your face, arms, or legs, especially if it happens on one side of your body.  You have trouble talking, trouble understanding, or both.  Your face or your eyelid droops on one side. These symptoms may be  an emergency. Do not wait to see if the symptoms will go away. Get medical help right away. Call your local emergency services (911 in the [place] Do not drive yourself to the hospital. This information is not intended to replace advice given to you by your health care provider. Make sure you discuss any questions you have with your health care provider. Document Released: 12/10/2007 Document Revised: 08/08/2015 Document Reviewed: 06/27/2014 Elsevier Interactive Patient Education  2017 [employer].

## 2016-07-23 NOTE — Progress Notes (Signed)
Cardiology Office Note   Date:  07/24/2016   ID:  Erin [name], DOB 11/21/1930, MRN [id number]  Referring Doctor:  Einar Pheasant, MD   Cardiologist:   Wende Bushy, MD   Reason for consultation:  Chief Complaint  Patient presents with  . OTHER    F/u echo, holter and myoview. Meds reviewed verbally with pt.      History of Present Illness: Erin [name] is a 83 y.o. female who is being seen today forffup after tests  Since last visit, pt has had no issues with Cp, SOB, palpitations. She was concerned about the call she received to schedule todays visit. Explained that it is so that we can discuss monitor results.  ROS:  Please see the history of present illness. Aside from mentioned under HPI, all other systems are reviewed and negative.    Past Medical History:  Diagnosis Date  . Chest pain 07/13/2016  . History of colon polyps   . Hypercholesterolemia   . Hypertension     Past Surgical History:  Procedure Laterality Date  . APPENDECTOMY  1946  . CATARACT EXTRACTION Bilateral   . DILATION AND CURETTAGE OF UTERUS    . LAMINECTOMY  2000  . release of foraminal stenosis  2003     reports that she has quit smoking. She has never used smokeless tobacco. She reports that she drinks alcohol. She reports that she does not use drugs.   family history includes Breast cancer in her maternal aunt; Heart disease in her brother; Hypertension in her mother and sister; Multiple sclerosis in her sister.   Outpatient Medications Prior to Visit  Medication Sig Dispense Refill  . aspirin 81 MG tablet Take 81 mg by mouth daily.    . benazepril (LOTENSIN) 40 MG tablet Take 1 tablet (40 mg total) by mouth 2 (two) times daily. 60 tablet 11  . Calcium Carbonate-Vitamin D (CALCIUM 600/VITAMIN D) 600-400 MG-UNIT per tablet Take 1 tablet by mouth daily.    . felodipine (PLENDIL) 2.5 MG 24 hr tablet Take 1 tablet (2.5 mg total) by mouth daily. 30 tablet 6  . furosemide (LASIX) 20 MG  tablet Take 1 tablet (20 mg total) by mouth daily. 30 tablet 11  . Multiple Vitamin (MULTIVITAMIN) tablet Take 1 tablet by mouth daily.    . niacin 500 MG tablet Take 1,500 mg by mouth at bedtime.     . Omega-3 Fatty Acids (FISH OIL) 1000 MG CAPS Take by mouth daily.    . Red Yeast Rice Extract (RED YEAST RICE PO) Take 2 tablets by mouth.    . methylPREDNISolone (MEDROL DOSEPAK) 4 MG TBPK tablet Medrol dose pack - 6 day taper.  Take as directed. (Patient not taking: Reported on 07/23/2016) 21 tablet 0   No facility-administered medications prior to visit.      Allergies: Erythromycin and Minocin [minocycline hcl]    PHYSICAL EXAM: VS:  BP 138/64 (BP Location: Left Arm, Patient Position: Sitting, Cuff Size: Normal)   Pulse (!) 58   Ht 5' (1.524 m)   Wt 128 lb 8 oz (58.3 kg)   BMI 25.10 kg/m  , Body mass index is 25.1 kg/m. Wt Readings from Last 3 Encounters:  07/23/16 128 lb 8 oz (58.3 kg)  07/07/16 126 lb 12 oz (57.5 kg)  06/16/16 128 lb (58.1 kg)    GENERAL:  well developed, well nourished, not in acute distress HEENT: normocephalic, pink conjunctivae, anicteric sclerae, no xanthelasma, normal dentition, oropharynx  clear NECK:  no neck vein engorgement, JVP normal, no hepatojugular reflux, carotid upstroke brisk and symmetric, no bruit, no thyromegaly, no lymphadenopathy LUNGS:  good respiratory effort, clear to auscultation bilaterally CV:  PMI not displaced, no thrills, no lifts, S1 and S2 within normal limits, no palpable S3 or S4, no murmurs, no rubs, no gallops ABD:  Soft, nontender, nondistended, normoactive bowel sounds, no abdominal aortic bruit, no hepatomegaly, no splenomegaly MS: nontender back, no kyphosis, no scoliosis, no joint deformities EXT:  2+ DP/PT pulses, no edema, no varicosities, no cyanosis, no clubbing SKIN: warm, nondiaphoretic, normal turgor, no ulcers NEUROPSYCH: alert, oriented to person, place, and time, sensory/motor grossly intact, normal mood,  appropriate affect    Recent Labs: 11/11/2015: TSH 0.91 03/31/2016: ALT 12 06/11/2016: BUN 15; Creatinine, Ser 0.83; Hemoglobin 14.0; Platelets 215; Potassium 4.3; Sodium 138   Lipid Panel    Component Value Date/Time   CHOL 255 (H) 03/31/2016 0838   TRIG 134.0 03/31/2016 0838   HDL 93.50 03/31/2016 0838   CHOLHDL 3 03/31/2016 0838   VLDL 26.8 03/31/2016 0838   LDLCALC 134 (H) 03/31/2016 8850     Other studies Reviewed:  EKG:  The ekg from 07/07/2016 was personally reviewed by me and it revealed sinus rhythm, 74 BPM. PACs.  Additional studies/ records that were reviewed personally reviewed by me today include:  Long term monitor 07/15/2016: Underlying rhythm was sinus. Heart rate ranged from 40-137 bpm, average of 68 BPM.  There were 20 episodes of narrow complex tachycardia, with significant irregularity. Likely consistent with atrial fibrillation. Average of 137 BPM. Longest was 11.9 seconds.  Some rhythm strips were interpreted to be "possible ectopic atrial rhythm or accelerated junctional rhythm". Upon closer inspection, these are likely consistent with atrial fibrillation with controlled ventricular response.  Supraventricular ectopy: 5.3% of the total number of beats, 15,255. Rare atrial couplets and rare atrial triplets.   Nuclear stress test 07/13/2016:  Blood pressure demonstrated a normal response to exercise.  There was no ST segment deviation noted during stress.  No T wave inversion was noted during stress.  The study is normal.  This is a low risk study.  The left ventricular ejection fraction is hyperdynamic (>65%).  ASSESSMENT AND PLAN:  Chest pain Risk factors for CAD include age, hypertension, hyperlipidemia No evidence of ischemia on stress test ,EF nl.  Awaiting echo. Pt reassured, likelihood of clinically significant CAD is low.   Systolic murmur Previously noted. Echocardiogram recommended -- ordered and scheduled.  Premature atrial  complexes afib, paroxysmal, noted on monitor CHADS2-VASc= 4 Discussed nature/pathophysiology, treatment options, stroke risk reduction. [place] recommended, risks/benefits discussed. Pt wanted to discuss this first with family.She will let us know if she would like to start J[place]re. Likely Eliquis 2.5mg  bid. Wil lrepeat CMP in one mo if she starts oac. Recommend sleep study  Hypertension BP is well controlled. Continue monitoring BP. Continue current medical therapy and lifestyle changes.    Current medicines are reviewed at length with the patient today.  The patient does not have concerns regarding medicines.  Labs/ tests ordered today include:  Orders Placed This Encounter  Procedures  . EKG 12-Lead    I had a lengthy and detailed discussion with the patient regarding diagnoses, prognosis, diagnostic options.  I counseled the patient on importance of lifestyle modification including heart healthy diet, regular physical activity  .   Disposition:   FU with Cardiology after tests   I spent at least 40 minutes with the patient  today and more than 50% of the time was spent counseling the patient and coordinating care.    Signed, Wende Bushy, MD  07/24/2016 6:20 AM    [place]  This note was generated in part with voice recognition software and I apologize for any typographical errors that were not detected and corrected.

## 2016-07-28 DIAGNOSIS — M4722 Other spondylosis with radiculopathy, cervical region: Secondary | ICD-10-CM | POA: Diagnosis not present

## 2016-08-03 ENCOUNTER — Telehealth: Payer: Self-pay | Admitting: Cardiology

## 2016-08-03 DIAGNOSIS — M4722 Other spondylosis with radiculopathy, cervical region: Secondary | ICD-10-CM | POA: Diagnosis not present

## 2016-08-03 NOTE — Telephone Encounter (Signed)
Pt calling stating she would like a call back  She would not say what the question was  Please call back

## 2016-08-03 NOTE — Telephone Encounter (Signed)
Returned call to patient. She said she was calling to talk to Nurse Olin Hauser. I asked if there was anything I could help her with today and she said no. Patient stated "I will wait to talk to [facility]. You don't even have to give her the message."

## 2016-08-04 ENCOUNTER — Telehealth: Payer: Self-pay | Admitting: Cardiology

## 2016-08-04 NOTE — Telephone Encounter (Signed)
-----   Message from Valora Corporal, RN sent at 08/04/2016  2:25 PM EDT ----- Regarding: Appt Patient was to have 1 month follow up. Just trying to follow up on everyone. Thanks again for your help.

## 2016-08-04 NOTE — Telephone Encounter (Signed)
Spoke with patient and she wanted to know who would be taking over for Dr. Yvone Neu in managing her care. Reviewed that we have several options and that Dr. Yvone Neu had very detailed notes. She verbalized understanding but would like to know if she could see someone to discuss her options regarding the eliquis. Let her know that I would have someone give her a call to schedule appointment with NP, PA, or one of our other physicians. She was agreeable to this and had no further questions at this time. Instructed her to call back if I can assist in anyway.

## 2016-08-04 NOTE — Telephone Encounter (Signed)
Lmov for patient to call back and schedule an appointment  ° °

## 2016-08-06 ENCOUNTER — Other Ambulatory Visit: Payer: Self-pay

## 2016-08-06 ENCOUNTER — Ambulatory Visit (INDEPENDENT_AMBULATORY_CARE_PROVIDER_SITE_OTHER): Payer: PPO

## 2016-08-06 ENCOUNTER — Telehealth: Payer: Self-pay

## 2016-08-06 DIAGNOSIS — R079 Chest pain, unspecified: Secondary | ICD-10-CM | POA: Diagnosis not present

## 2016-08-06 NOTE — Telephone Encounter (Signed)
Left voicemail message to call back  

## 2016-08-06 NOTE — Telephone Encounter (Signed)
Patient wants to talk to [facility] about a medication and about new provider. Patient refused to supply additional information

## 2016-08-06 NOTE — Telephone Encounter (Signed)
Spoke with patient in detail regarding medication and upcoming appointment. Let her know that we would put her on our waiting list if there is a cancellation and she verbalized understanding with no further questions at this time.

## 2016-09-24 DIAGNOSIS — L565 Disseminated superficial actinic porokeratosis (DSAP): Secondary | ICD-10-CM | POA: Diagnosis not present

## 2016-09-24 DIAGNOSIS — L309 Dermatitis, unspecified: Secondary | ICD-10-CM | POA: Diagnosis not present

## 2016-10-06 ENCOUNTER — Ambulatory Visit: Payer: PPO | Admitting: Internal Medicine

## 2016-10-07 ENCOUNTER — Ambulatory Visit (INDEPENDENT_AMBULATORY_CARE_PROVIDER_SITE_OTHER): Payer: PPO

## 2016-10-07 ENCOUNTER — Encounter: Payer: Self-pay | Admitting: Internal Medicine

## 2016-10-07 ENCOUNTER — Ambulatory Visit (INDEPENDENT_AMBULATORY_CARE_PROVIDER_SITE_OTHER): Payer: PPO | Admitting: Internal Medicine

## 2016-10-07 VITALS — BP 130/58 | HR 68 | Ht 60.0 in | Wt 125.2 lb

## 2016-10-07 DIAGNOSIS — I480 Paroxysmal atrial fibrillation: Secondary | ICD-10-CM | POA: Diagnosis not present

## 2016-10-07 DIAGNOSIS — I491 Atrial premature depolarization: Secondary | ICD-10-CM

## 2016-10-07 NOTE — Patient Instructions (Signed)
Medication Instructions:  Your physician has recommended you make the following change in your medication:  1- STOP taking Niacin.   Labwork: none  Testing/Procedures: Your physician has recommended that you wear an 14 DAY ZIO event monitor. Event monitors are medical devices that record the heart's electrical activity. Doctors most often [place] these monitors to diagnose arrhythmias. Arrhythmias are problems with the speed or rhythm of the heartbeat. The monitor is a small, portable device. You can wear one while you do your normal daily activities. This is usually used to diagnose what is causing palpitations/syncope (passing out).  A Zio Patch Event Heart monitor will be applied to your chest today.  You will wear the patch for 14 days. After 24 hours, you may shower with the heart monitor on. If you feel any SYMPTOMS, you may press and release the button in the middle of the monitor.  At the completion of the 14 days, follow the instructions in the booklet and mail back to monitor company.   Follow-Up: Your physician recommends that you schedule a follow-up appointment in: [place].  If you need a refill on your cardiac medications before your next appointment, please call your pharmacy.

## 2016-10-07 NOTE — Progress Notes (Signed)
Follow-up Outpatient Visit Date: 10/07/2016  Primary Care Provider: Einar Pheasant, [place] [address] [address]  Chief Complaint: Follow-up atrial fibrillation  HPI:  Erin [name] is a 83 y.o. year-old female with history of paroxysmal atrial fibrillation, hypertension, and hyperlipidemia who presents for follow-up of chest pain and PAF. She was last seen on 07/23/16 by Dr. Yvone Neu. At that time, monitor results were reviewed, demonstrating PACs and paroxysmal atrial fibrillation. Initiation of anticoagulation given CHADSVASC score of 4 was recommended, though the patient deferred this in order to discuss it with her family first.  Today, Erin [name] reports that she has been feeling well. She is not having any chest pain, shortness of breath, palpitations, or lightheadedness. She reports being off balance when standing still and often has to hold onto something. She has not had any falls. She remains active, walking 2 miles most days of the week. 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 She is currently on aspirin 81 mg daily without significant bleeding.  --------------------------------------------------------------------------------------------------  Cardiovascular History & Procedures: Cardiovascular Problems:  Paroxysmal atrial fibrillation  Atypical chest pain  Risk Factors:  Hypertension, hyperlipidemia, and age greater than 60  Cath/PCI:  None  CV Surgery:  None  EP Procedures and Devices:  Event monitor (07/07/16): Predominantly sinus rhythm with 20 episodes of narrow complex tachycardia and irregularity consistent with atrial fibrillation (longest episode noted approximately 12 seconds). Frequent PACs and rare PVCs were noted.  Non-Invasive Evaluation(s):  TTE (08/06/16): Normal LV size with LVEF of 55-65%. Normal wall motion with grade 1 diastolic dysfunction. Mild left atrial enlargement. Normal RV size and  function.  Exercise MPI (07/13/16): Low risk study without ischemia or scar. LVEF greater than 65%. Decreased exercise capacity, exercising 4 minutes, zero seconds, achieving 117 bpm (90% MPHR; 4.6 METs).  Recent CV Pertinent Labs: Lab Results  Component Value Date   CHOL 255 (H) 03/31/2016   HDL 93.50 03/31/2016   LDLCALC 134 (H) 03/31/2016   TRIG 134.0 03/31/2016   CHOLHDL 3 03/31/2016   K 4.3 06/11/2016   K 3.5 12/21/2011   BUN 15 06/11/2016   CREATININE 0.83 06/11/2016   CREATININE 0.84 12/28/2011    Past medical and surgical history were reviewed and updated in EPIC.  Current Meds  Medication Sig  . aspirin 81 MG tablet Take 81 mg by mouth daily.  . benazepril (LOTENSIN) 40 MG tablet Take 1 tablet (40 mg total) by mouth 2 (two) times daily.  . betamethasone dipropionate (DIPROLENE) 0.05 % ointment   . Calcium Carbonate-Vitamin D (CALCIUM 600/VITAMIN D) 600-400 MG-UNIT per tablet Take 1 tablet by mouth daily.  . felodipine (PLENDIL) 2.5 MG 24 hr tablet Take 1 tablet (2.5 mg total) by mouth daily.  . furosemide (LASIX) 20 MG tablet Take 1 tablet (20 mg total) by mouth daily.  . Multiple Vitamin (MULTIVITAMIN) tablet Take 1 tablet by mouth daily.  . niacin 500 MG tablet Take 1,500 mg by mouth at bedtime.   . Omega-3 Fatty Acids (FISH OIL) 1000 MG CAPS Take by mouth daily.  . Red Yeast Rice Extract (RED YEAST RICE PO) Take 2 tablets by mouth.  . sulfamethoxazole-trimethoprim (BACTRIM DS,SEPTRA DS) 800-160 MG tablet     Allergies: Erythromycin and Minocin [minocycline hcl]  Social History   Social History  . Marital status: Widowed    Spouse name: N/A  . Number of children: 4  . Years of education: N/A   Occupational History  .  retired    Social History Main Topics  . Smoking status: Former Research scientist (life sciences)  . Smokeless tobacco: Never Used  . Alcohol use 0.0 oz/week     Comment: rare  . Drug use: No  . Sexual activity: No   Other Topics Concern  . Not on file   Social  History Narrative  . No narrative on file    Family History  Problem Relation Age of Onset  . Breast cancer Maternal Aunt   . Hypertension Mother   . Hypertension Sister   . Heart disease Brother        S/P CABG  . Multiple sclerosis Sister        died 43    Review of Systems: A 12-system review of systems was performed and was negative except as noted in the HPI.  --------------------------------------------------------------------------------------------------  Physical Exam: BP (!) 130/58 (BP Location: Left Arm, Patient Position: Sitting, Cuff Size: Normal)   Pulse 68   Ht 5' (1.524 m)   Wt 125 lb 4 oz (56.8 kg)   BMI 24.46 kg/m   General:  Well-developed, well-nourished elderly woman, seated comfortably in the exam room. HEENT: No conjunctival pallor or scleral icterus. Moist mucous membranes.  OP clear. Neck: Supple without lymphadenopathy, thyromegaly, JVD, or HJR. No carotid bruit. Lungs: Normal work of breathing. Clear to auscultation bilaterally without wheezes or crackles. Heart: Regular rate and rhythm with occasional extrasystoles. No murmurs, rubs, or gallops. Non-displaced PMI. Abd: Bowel sounds present. Soft, NT/ND without hepatosplenomegaly Ext: No lower extremity edema. Radial, PT, and DP pulses are 2+ bilaterally. Skin: Warm and dry. Multiple erythematous papules on upper extremities.  EKG:  Normal sinus rhythm with occasional PACs.  Lab Results  Component Value Date   WBC 4.7 06/11/2016   HGB 14.0 06/11/2016   HCT 39.9 06/11/2016   MCV 95.4 06/11/2016   PLT 215 06/11/2016    Lab Results  Component Value Date   NA 138 06/11/2016   K 4.3 06/11/2016   CL 102 06/11/2016   CO2 29 06/11/2016   BUN 15 06/11/2016   CREATININE 0.83 06/11/2016   GLUCOSE 100 (H) 06/11/2016   ALT 12 03/31/2016    Lab Results  Component Value Date   CHOL 255 (H) 03/31/2016   HDL 93.50 03/31/2016   LDLCALC 134 (H) 03/31/2016   TRIG 134.0 03/31/2016   CHOLHDL 3  03/31/2016    --------------------------------------------------------------------------------------------------  ASSESSMENT AND PLAN: Paroxysmal atrial fibrillation and premature atrial contractions Erin [name] is asymptomatic. I have reviewed her monitor from April, which she wore for 3 days. While there are frequent PACs and episodes of narrow complex tachycardia that are somewhat irregular, they are relatively brief, lasting up to 12 seconds. I think we are hard-pressed to diagnose atrial fibrillation based on these short atrial runs, particularly given Ms. Debroux's hesitancy to proceed with therapeutic anticoagulation. We have agreed to repeat an event monitor for 14 days to see if there is any evidence of sustained arrhythmia consistent with atrial fibrillation. If so, she is agreeable to beginning a NOAC.  Follow-up: Return to clinic in 4-6 weeks.  Nelva Bush, MD 10/07/2016 9:52 AM

## 2016-10-13 ENCOUNTER — Telehealth: Payer: Self-pay | Admitting: *Deleted

## 2016-10-13 DIAGNOSIS — X32XXXA Exposure to sunlight, initial encounter: Secondary | ICD-10-CM | POA: Diagnosis not present

## 2016-10-13 DIAGNOSIS — D485 Neoplasm of uncertain behavior of skin: Secondary | ICD-10-CM | POA: Diagnosis not present

## 2016-10-13 DIAGNOSIS — L570 Actinic keratosis: Secondary | ICD-10-CM | POA: Diagnosis not present

## 2016-10-13 DIAGNOSIS — L309 Dermatitis, unspecified: Secondary | ICD-10-CM | POA: Diagnosis not present

## 2016-10-13 DIAGNOSIS — C44629 Squamous cell carcinoma of skin of left upper limb, including shoulder: Secondary | ICD-10-CM | POA: Diagnosis not present

## 2016-10-13 NOTE — Telephone Encounter (Signed)
Received request from Erick Blinks at [facility] with ZIO monitor to send Clinical documentation that supports the order/prescription of the ZIO service to her at [phone]. Office visit note with Dr End from 10/07/16 sent to number provided.  Phone number [phone]

## 2016-10-21 DIAGNOSIS — I480 Paroxysmal atrial fibrillation: Secondary | ICD-10-CM

## 2016-10-28 DIAGNOSIS — I480 Paroxysmal atrial fibrillation: Secondary | ICD-10-CM | POA: Diagnosis not present

## 2016-11-03 DIAGNOSIS — L905 Scar conditions and fibrosis of skin: Secondary | ICD-10-CM | POA: Diagnosis not present

## 2016-11-03 DIAGNOSIS — C44629 Squamous cell carcinoma of skin of left upper limb, including shoulder: Secondary | ICD-10-CM | POA: Diagnosis not present

## 2016-11-04 ENCOUNTER — Other Ambulatory Visit: Payer: Self-pay

## 2016-11-04 MED ORDER — FELODIPINE ER 2.5 MG PO TB24
ORAL_TABLET | ORAL | 1 refills | Status: DC
Start: 2016-11-04 — End: 2017-01-29

## 2016-11-06 DIAGNOSIS — Z961 Presence of intraocular lens: Secondary | ICD-10-CM | POA: Diagnosis not present

## 2016-11-20 ENCOUNTER — Other Ambulatory Visit (INDEPENDENT_AMBULATORY_CARE_PROVIDER_SITE_OTHER): Payer: PPO

## 2016-11-20 DIAGNOSIS — E7800 Pure hypercholesterolemia, unspecified: Secondary | ICD-10-CM | POA: Diagnosis not present

## 2016-11-20 DIAGNOSIS — R739 Hyperglycemia, unspecified: Secondary | ICD-10-CM

## 2016-11-20 DIAGNOSIS — I10 Essential (primary) hypertension: Secondary | ICD-10-CM

## 2016-11-20 LAB — CBC WITH DIFFERENTIAL/PLATELET
BASOS PCT: 0.6 % (ref 0.0–3.0)
Basophils Absolute: 0 10*3/uL (ref 0.0–0.1)
EOS PCT: 1.7 % (ref 0.0–5.0)
Eosinophils Absolute: 0.1 10*3/uL (ref 0.0–0.7)
HCT: 42.3 % (ref 36.0–46.0)
Hemoglobin: 14.4 g/dL (ref 12.0–15.0)
LYMPHS ABS: 0.9 10*3/uL (ref 0.7–4.0)
Lymphocytes Relative: 19.3 % (ref 12.0–46.0)
MCHC: 34 g/dL (ref 30.0–36.0)
MCV: 98.5 fl (ref 78.0–100.0)
MONO ABS: 0.5 10*3/uL (ref 0.1–1.0)
Monocytes Relative: 11.1 % (ref 3.0–12.0)
Neutro Abs: 3.3 10*3/uL (ref 1.4–7.7)
Neutrophils Relative %: 67.3 % (ref 43.0–77.0)
Platelets: 246 10*3/uL (ref 150.0–400.0)
RBC: 4.29 Mil/uL (ref 3.87–5.11)
RDW: 13.4 % (ref 11.5–15.5)
WBC: 4.9 10*3/uL (ref 4.0–10.5)

## 2016-11-20 LAB — BASIC METABOLIC PANEL
BUN: 17 mg/dL (ref 6–23)
CALCIUM: 10.3 mg/dL (ref 8.4–10.5)
CO2: 31 meq/L (ref 19–32)
CREATININE: 0.9 mg/dL (ref 0.40–1.20)
Chloride: 97 mEq/L (ref 96–112)
GFR: 63.3 mL/min (ref >60.00)
Glucose, Bld: 94 mg/dL (ref 70–99)
Potassium: 4.2 mEq/L (ref 3.5–5.1)
SODIUM: 136 meq/L (ref 135–145)

## 2016-11-20 LAB — LIPID PANEL
Cholesterol: 254 mg/dL — ABNORMAL HIGH (ref 0–200)
HDL: 91.7 mg/dL (ref >39.00)
LDL Cholesterol: 142 mg/dL — ABNORMAL HIGH (ref 0–99)
NONHDL: 162.33
Total CHOL/HDL Ratio: 3
Triglycerides: 100 mg/dL (ref 0.0–149.0)
VLDL: 20 mg/dL (ref 0.0–40.0)

## 2016-11-20 LAB — HEPATIC FUNCTION PANEL
ALT: 12 U/L (ref 0–35)
AST: 17 U/L (ref 0–37)
Albumin: 4.2 g/dL (ref 3.5–5.2)
Alkaline Phosphatase: 52 U/L (ref 39–117)
BILIRUBIN TOTAL: 0.7 mg/dL (ref 0.2–1.2)
Bilirubin, Direct: 0.1 mg/dL (ref 0.0–0.3)
Total Protein: 6.5 g/dL (ref 6.0–8.3)

## 2016-11-20 LAB — HEMOGLOBIN A1C: HEMOGLOBIN A1C: 5.4 % (ref 4.6–6.5)

## 2016-11-20 LAB — TSH: TSH: 1.12 u[IU]/mL (ref 0.35–4.50)

## 2016-11-22 NOTE — Progress Notes (Signed)
Follow-up Outpatient Visit Date: 11/24/2016  Primary Care Provider: Einar Pheasant, [facility] [address] [address]  Chief Complaint: Follow-up atrial arrhythmia  HPI:  Erin [name] is a 83 y.o. year-old female with history of atrial arrhythmia, hypertension, and hyperlipidemia who presents for follow-up of her arrhythmia. I last saw her in July to discuss possible paroxysmal atrial fibrillation noted on preceding Holter monitor. Brief atrial runs were noted, lasting up to 11.9 seconds. Erin [name] was hesitant to start anticoagulation, which had been recommended by Dr. Yvone Neu. We therefore agreed to repeat a monitor for 2 weeks. Frequent PACs and multiple atrial runs lasting up to 19.4 seconds were noted.  Today, Erin [name] reports that she has been feeling well. She denies palpitations, lightheadedness, chest pain, and shortness of breath. She has not had any falls. She is using furosemide as needed 2-3 days a week if she notices some abdominal puffiness. She has not had any leg edema. Home blood pressures have typically ranged from 106/64-120/70. She remains on aspirin without any bleeding, though she has had some easy bruising. She denies falls.  --------------------------------------------------------------------------------------------------  Cardiovascular History & Procedures: Cardiovascular Problems:  Paroxysmal atrial fibrillation  Atypical chest pain  Risk Factors:  Hypertension, hyperlipidemia, and age greater than 30  Cath/PCI:  None  CV Surgery:  None  EP Procedures and Devices:  Event monitor (10/07/16): Predominantly sinus rhythm with frequent PACs and occasional PVCs. Single episode of NSVT. Multiple atrial runs lasting up to 19.4 seconds. No sustained arrhythmias.  Event monitor (07/07/16): Predominantly sinus rhythm with 20 episodes of narrow complex tachycardia and irregularity consistent with atrial fibrillation (longest episode noted  approximately 12 seconds). Frequent PACs and rare PVCs were noted.  Non-Invasive Evaluation(s):  TTE (08/06/16): Normal LV size with LVEF of 55-65%. Normal wall motion with grade 1 diastolic dysfunction. Mild left atrial enlargement. Normal RV size and function.  Exercise MPI (07/13/16): Low risk study without ischemia or scar. LVEF greater than 65%. Decreased exercise capacity, exercising 4 minutes, zero seconds, achieving 117 bpm (90% MPHR; 4.6 METs).  Recent CV Pertinent Labs: Lab Results  Component Value Date   CHOL 254 (H) 11/20/2016   HDL 91.70 11/20/2016   LDLCALC 142 (H) 11/20/2016   TRIG 100.0 11/20/2016   CHOLHDL 3 11/20/2016   K 4.2 11/20/2016   K 3.5 12/21/2011   BUN 17 11/20/2016   CREATININE 0.90 11/20/2016   CREATININE 0.84 12/28/2011   Past medical and surgical history were reviewed and updated in Epic.  Current Meds  Medication Sig  . aspirin 81 MG tablet Take 81 mg by mouth daily.  . benazepril (LOTENSIN) 40 MG tablet Take 1 tablet (40 mg total) by mouth 2 (two) times daily.  . Calcium Carbonate-Vitamin D (CALCIUM 600/VITAMIN D) 600-400 MG-UNIT per tablet Take 1 tablet by mouth daily.  . felodipine (PLENDIL) 2.5 MG 24 hr tablet Take 1 tablet (2.5 mg total) by mouth daily.  . furosemide (LASIX) 20 MG tablet Take 1 tablet (20 mg total) by mouth daily. (Patient taking differently: Take 20 mg by mouth daily as needed. )  . Multiple Vitamin (MULTIVITAMIN) tablet Take 1 tablet by mouth daily.  . Omega-3 Fatty Acids (FISH OIL) 1000 MG CAPS Take by mouth daily.  . Red Yeast Rice Extract (RED YEAST RICE PO) Take 2 tablets by mouth.    Allergies: Erythromycin and Minocin [minocycline hcl]  Social History   Social History  . Marital status: Widowed    Spouse name:  N/A  . Number of children: 4  . Years of education: N/A   Occupational History  . retired    Social History Main Topics  . Smoking status: Former Research scientist (life sciences)  . Smokeless tobacco: Never Used  . Alcohol  use 0.0 oz/week     Comment: rare  . Drug use: No  . Sexual activity: No   Other Topics Concern  . Not on file   Social History Narrative  . No narrative on file    Family History  Problem Relation Age of Onset  . Breast cancer Maternal Aunt   . Hypertension Mother   . Hypertension Sister   . Heart disease Brother        S/P CABG  . Multiple sclerosis Sister        died 65    Review of Systems: A 12-system review of systems was performed and was negative except as noted in the HPI.  --------------------------------------------------------------------------------------------------  Physical Exam: BP 130/70 (BP Location: Left Arm, Patient Position: Sitting, Cuff Size: Normal)   Pulse 77   Ht 5' (1.524 m)   Wt 124 lb 8 oz (56.5 kg)   BMI 24.31 kg/m   General:  Elderly woman appearing younger than her stated age, seated comfortably in the exam room. HEENT: No conjunctival pallor or scleral icterus.  Moist mucous membranes.  OP clear. Neck: Supple without lymphadenopathy, thyromegaly, JVD, or HJR. Lungs: Normal work of breathing.  Clear to auscultation bilaterally without wheezes or crackles. Heart: Regular rate and rhythm with frequent extrasystoles. No murmurs, rubs, or gallops. Abd: Bowel sounds present.  Soft, NT/ND without hepatosplenomegaly Ext: No lower extremity edema.  Radial, PT, and DP pulses are 2+ bilaterally. Skin: Warm and dry without rash. Neuro: Cranial nerves III through XII grossly intact. Normal gait and strength  EKG: Normal sinus rhythm with frequent PACs versus wandering pacemaker.  Lab Results  Component Value Date   WBC 4.9 11/20/2016   HGB 14.4 11/20/2016   HCT 42.3 11/20/2016   MCV 98.5 11/20/2016   PLT 246.0 11/20/2016    Lab Results  Component Value Date   NA 136 11/20/2016   K 4.2 11/20/2016   CL 97 11/20/2016   CO2 31 11/20/2016   BUN 17 11/20/2016   CREATININE 0.90 11/20/2016   GLUCOSE 94 11/20/2016   ALT 12 11/20/2016     Lab Results  Component Value Date   CHOL 254 (H) 11/20/2016   HDL 91.70 11/20/2016   LDLCALC 142 (H) 11/20/2016   TRIG 100.0 11/20/2016   CHOLHDL 3 11/20/2016    --------------------------------------------------------------------------------------------------  ASSESSMENT AND PLAN: Atrial arrhythmia Erin [name] remains asymptomatic. Her 2 week monitor revealed bursts of narrow complex tachycardia, some of which appear irregular, lasting up to 20 seconds. I favor this representing some sort of atrial tachycardia. The short duration of these episodes does not qualify for a diagnosis of paroxysmal atrial fibrillation. In light of this, we have agreed to defer therapeutic anticoagulation. Erin [name] will remain on low-dose aspirin. We have agreed to start metoprolol succinate 12.5 mg daily to see if this helps suppress her PACs and atrial runs.  Essential hypertension Blood pressure today is upper normal to borderline elevated, though home readings reported by Erin [name] are better. As above, we will add low-dose metoprolol succinate. If symptomatic hypotension becomes an issue, we will consider decreasing amlodipine and or been as a parole to facilitate metoprolol continuation/titration.  Follow-up: Return to clinic in 3 months  Nelva Bush, MD 11/24/2016  1:53 PM

## 2016-11-24 ENCOUNTER — Encounter: Payer: Self-pay | Admitting: Internal Medicine

## 2016-11-24 ENCOUNTER — Ambulatory Visit (INDEPENDENT_AMBULATORY_CARE_PROVIDER_SITE_OTHER): Payer: PPO | Admitting: Internal Medicine

## 2016-11-24 VITALS — BP 130/70 | HR 77 | Ht 60.0 in | Wt 124.5 lb

## 2016-11-24 DIAGNOSIS — Z0000 Encounter for general adult medical examination without abnormal findings: Secondary | ICD-10-CM

## 2016-11-24 DIAGNOSIS — Z23 Encounter for immunization: Secondary | ICD-10-CM

## 2016-11-24 DIAGNOSIS — E7800 Pure hypercholesterolemia, unspecified: Secondary | ICD-10-CM | POA: Diagnosis not present

## 2016-11-24 DIAGNOSIS — R739 Hyperglycemia, unspecified: Secondary | ICD-10-CM | POA: Diagnosis not present

## 2016-11-24 DIAGNOSIS — I10 Essential (primary) hypertension: Secondary | ICD-10-CM

## 2016-11-24 DIAGNOSIS — R634 Abnormal weight loss: Secondary | ICD-10-CM

## 2016-11-24 DIAGNOSIS — F439 Reaction to severe stress, unspecified: Secondary | ICD-10-CM | POA: Diagnosis not present

## 2016-11-24 DIAGNOSIS — I4892 Unspecified atrial flutter: Secondary | ICD-10-CM | POA: Insufficient documentation

## 2016-11-24 DIAGNOSIS — I498 Other specified cardiac arrhythmias: Secondary | ICD-10-CM | POA: Insufficient documentation

## 2016-11-24 MED ORDER — METOPROLOL SUCCINATE ER 25 MG PO TB24: 12.5000 mg | ORAL_TABLET | Freq: Every day | ORAL | 3 refills | Status: DC

## 2016-11-24 NOTE — Patient Instructions (Signed)
Shingrix (new shingles vaccine)

## 2016-11-24 NOTE — Progress Notes (Signed)
Patient ID: Erin [name], female   DOB: 1930-08-25, 83 y.o.   MRN: [id number]   Subjective:    Patient ID: Erin [name], female    DOB: September 25, 1930, 83 y.o.   MRN: [id number]  HPI  Patient with past history of hypercholesterolemia and hypertension.  She comes in today to follow up on these issues as well as for a complete physical exam.  Recently evaluated by Dr Aris Lot and diagnosed with C8 radiculopathy.  Recommended physical therapy.   Also followed by cardiology.  Note reviewed.  Question raised of afib.  Just wore monitor for two weeks.  Has f/u with Dr End today.  On aspirin.  Still exercising - walking.  No chest pain.  Breathing stable.  No acid reflux.  No abdominal pain.  Bowels moving.   States blood pressure has been averaging 118-120/60s.  Has lost some weight.  Is exercising.  Eating.     Past Medical History:  Diagnosis Date  . Chest pain 07/13/2016  . History of colon polyps   . Hypercholesterolemia   . Hypertension    Past Surgical History:  Procedure Laterality Date  . APPENDECTOMY  1946  . CATARACT EXTRACTION Bilateral   . DILATION AND CURETTAGE OF UTERUS    . LAMINECTOMY  2000  . release of foraminal stenosis  2003   Family History  Problem Relation Age of Onset  . Breast cancer Maternal Aunt   . Hypertension Mother   . Hypertension Sister   . Heart disease Brother        S/P CABG  . Multiple sclerosis Sister        died 13   Social History   Social History  . Marital status: Widowed    Spouse name: N/A  . Number of children: 4  . Years of education: N/A   Occupational History  . retired    Social History Main Topics  . Smoking status: Former Research scientist (life sciences)  . Smokeless tobacco: Never Used  . Alcohol use 0.0 oz/week     Comment: rare  . Drug use: No  . Sexual activity: No   Other Topics Concern  . None   Social History Narrative  . None    Outpatient Encounter Prescriptions as of 11/24/2016  Medication Sig  . aspirin 81 MG tablet Take 81 mg by  mouth daily.  . benazepril (LOTENSIN) 40 MG tablet Take 1 tablet (40 mg total) by mouth 2 (two) times daily.  . Calcium Carbonate-Vitamin D (CALCIUM 600/VITAMIN D) 600-400 MG-UNIT per tablet Take 1 tablet by mouth daily.  . felodipine (PLENDIL) 2.5 MG 24 hr tablet Take 1 tablet (2.5 mg total) by mouth daily.  . furosemide (LASIX) 20 MG tablet Take 1 tablet (20 mg total) by mouth daily. (Patient taking differently: Take 20 mg by mouth daily as needed. )  . Multiple Vitamin (MULTIVITAMIN) tablet Take 1 tablet by mouth daily.  . Omega-3 Fatty Acids (FISH OIL) 1000 MG CAPS Take by mouth daily.  . Red Yeast Rice Extract (RED YEAST RICE PO) Take 2 tablets by mouth.  . [DISCONTINUED] betamethasone dipropionate (DIPROLENE) 0.05 % ointment   . [DISCONTINUED] sulfamethoxazole-trimethoprim (BACTRIM DS,SEPTRA DS) 800-160 MG tablet    No facility-administered encounter medications on file as of 11/24/2016.     Review of Systems  Constitutional: Negative for appetite change and unexpected weight change.  HENT: Negative for congestion and sinus pressure.   Eyes: Negative for pain and visual disturbance.  Respiratory: Negative for cough, chest  tightness and shortness of breath.   Cardiovascular: Negative for chest pain, palpitations and leg swelling.  Gastrointestinal: Negative for abdominal pain, diarrhea, nausea and vomiting.  Genitourinary: Negative for difficulty urinating and dysuria.  Musculoskeletal: Negative for back pain and joint swelling.  Skin: Negative for color change and rash.  Neurological: Negative for dizziness, light-headedness and headaches.  Hematological: Negative for adenopathy. Does not bruise/bleed easily.  Psychiatric/Behavioral: Negative for dysphoric mood.       Objective:     Blood pressure rechecked by me:  142/80  Physical Exam  Constitutional: She is oriented to person, place, and time. She appears well-developed and well-nourished. No distress.  HENT:  Nose: Nose  normal.  Mouth/Throat: Oropharynx is clear and moist.  Eyes: Right eye exhibits no discharge. Left eye exhibits no discharge. No scleral icterus.  Neck: Neck supple. No thyromegaly present.  Cardiovascular: Normal rate and regular rhythm.   Pulmonary/Chest: Breath sounds normal. No accessory muscle usage. No tachypnea. No respiratory distress. She has no decreased breath sounds. She has no wheezes. She has no rhonchi. Right breast exhibits no inverted nipple, no mass, no nipple discharge and no tenderness (no axillary adenopathy). Left breast exhibits no inverted nipple, no mass, no nipple discharge and no tenderness (no axilarry adenopathy).  Abdominal: Soft. Bowel sounds are normal. There is no tenderness.  Musculoskeletal: She exhibits no edema or tenderness.  Lymphadenopathy:    She has no cervical adenopathy.  Neurological: She is alert and oriented to person, place, and time.  Skin: Skin is warm. No rash noted. No erythema.  Psychiatric: She has a normal mood and affect. Her behavior is normal.    BP 140/80 (BP Location: Left Arm, Patient Position: Sitting, Cuff Size: Normal)   Pulse 64   Temp (!) 97.5 F (36.4 C) (Oral)   Resp 16   Ht 4' 11.84" (1.52 m)   Wt 124 lb 6.4 oz (56.4 kg)   SpO2 97%   BMI 24.42 kg/m  Wt Readings from Last 3 Encounters:  11/24/16 124 lb 8 oz (56.5 kg)  11/24/16 124 lb 6.4 oz (56.4 kg)  10/07/16 125 lb 4 oz (56.8 kg)     Lab Results  Component Value Date   WBC 4.9 11/20/2016   HGB 14.4 11/20/2016   HCT 42.3 11/20/2016   PLT 246.0 11/20/2016   GLUCOSE 94 11/20/2016   CHOL 254 (H) 11/20/2016   TRIG 100.0 11/20/2016   HDL 91.70 11/20/2016   LDLCALC 142 (H) 11/20/2016   ALT 12 11/20/2016   AST 17 11/20/2016   NA 136 11/20/2016   K 4.2 11/20/2016   CL 97 11/20/2016   CREATININE 0.90 11/20/2016   BUN 17 11/20/2016   CO2 31 11/20/2016   TSH 1.12 11/20/2016   HGBA1C 5.4 11/20/2016    Nm Myocar Multi W/spect W/wall Motion / Ef  Result  Date: 07/13/2016  Blood pressure demonstrated a normal response to exercise.  There was no ST segment deviation noted during stress.  No T wave inversion was noted during stress.  The study is normal.  This is a low risk study.  The left ventricular ejection fraction is hyperdynamic (>65%).        Assessment & Plan:   Problem List Items Addressed This Visit    Health care maintenance    Physical today 11/24/16.  Declines f/u mammogram.        Hypercholesterolemia    Low cholesterol diet and exercise.  She prefers not to take a statin.  Had problems previously with statin medication.  Follow lipid panel.       Relevant Orders   Hepatic function panel   Lipid panel   Hyperglycemia    Low carb diet and exercise.  Follow met b and a1c.        Relevant Orders   Hemoglobin A1c   Hypertension    Blood pressure has been under good control.  Continue same medication regimen.  Follow pressures.  Follow metabolic panel.        Relevant Orders   Basic metabolic panel   Stress    Handling stress.  Has good support.        Weight loss    Slightly more decreased.  Discussed with her today.  She is eating.  No nausea or vomiting.  Bowels moving.  Will follow.         Other Visit Diagnoses    Encounter for immunization       Relevant Orders   Flu vaccine HIGH DOSE PF (Completed)       Einar Pheasant, MD

## 2016-11-24 NOTE — Patient Instructions (Signed)
Medication Instructions:  Your physician has recommended you make the following change in your medication:  1- START Metoprolol succinate 0.5 tablet (12.5 mg) by mouth once a day.   Labwork: none  Testing/Procedures: none  Follow-Up: Your physician recommends that you schedule a follow-up appointment in: 3 MONTHS WITH DR END.   If you need a refill on your cardiac medications before your next appointment, please call your pharmacy.

## 2016-11-24 NOTE — Assessment & Plan Note (Signed)
Physical today 11/24/16.  Declines f/u mammogram.

## 2016-11-26 ENCOUNTER — Encounter: Payer: Self-pay | Admitting: Internal Medicine

## 2016-11-26 NOTE — Assessment & Plan Note (Signed)
Blood pressure has been under good control.  Continue same medication regimen.  Follow pressures.  Follow metabolic panel.   

## 2016-11-26 NOTE — Assessment & Plan Note (Signed)
Low cholesterol diet and exercise.  She prefers not to take a statin.  Had problems previously with statin medication.  Follow lipid panel.

## 2016-11-26 NOTE — Assessment & Plan Note (Signed)
Handling stress.  Has good support.   

## 2016-11-26 NOTE — Assessment & Plan Note (Signed)
Low carb diet and exercise.  Follow met b and a1c.   

## 2016-11-26 NOTE — Assessment & Plan Note (Signed)
Slightly more decreased.  Discussed with her today.  She is eating.  No nausea or vomiting.  Bowels moving.  Will follow.

## 2017-01-20 DIAGNOSIS — Z85828 Personal history of other malignant neoplasm of skin: Secondary | ICD-10-CM | POA: Diagnosis not present

## 2017-01-20 DIAGNOSIS — Z85820 Personal history of malignant melanoma of skin: Secondary | ICD-10-CM | POA: Diagnosis not present

## 2017-01-20 DIAGNOSIS — Z08 Encounter for follow-up examination after completed treatment for malignant neoplasm: Secondary | ICD-10-CM | POA: Diagnosis not present

## 2017-01-20 DIAGNOSIS — D044 Carcinoma in situ of skin of scalp and neck: Secondary | ICD-10-CM | POA: Diagnosis not present

## 2017-01-20 DIAGNOSIS — D485 Neoplasm of uncertain behavior of skin: Secondary | ICD-10-CM | POA: Diagnosis not present

## 2017-01-29 ENCOUNTER — Other Ambulatory Visit: Payer: Self-pay | Admitting: Internal Medicine

## 2017-02-03 DIAGNOSIS — S60412A Abrasion of right middle finger, initial encounter: Secondary | ICD-10-CM | POA: Diagnosis not present

## 2017-02-03 DIAGNOSIS — S60414A Abrasion of right ring finger, initial encounter: Secondary | ICD-10-CM | POA: Diagnosis not present

## 2017-02-23 ENCOUNTER — Ambulatory Visit: Payer: PPO | Admitting: Internal Medicine

## 2017-03-04 DIAGNOSIS — L905 Scar conditions and fibrosis of skin: Secondary | ICD-10-CM | POA: Diagnosis not present

## 2017-03-04 DIAGNOSIS — D044 Carcinoma in situ of skin of scalp and neck: Secondary | ICD-10-CM | POA: Diagnosis not present

## 2017-03-04 DIAGNOSIS — D485 Neoplasm of uncertain behavior of skin: Secondary | ICD-10-CM | POA: Diagnosis not present

## 2017-03-04 DIAGNOSIS — C44629 Squamous cell carcinoma of skin of left upper limb, including shoulder: Secondary | ICD-10-CM | POA: Diagnosis not present

## 2017-03-23 ENCOUNTER — Other Ambulatory Visit (INDEPENDENT_AMBULATORY_CARE_PROVIDER_SITE_OTHER): Payer: PPO

## 2017-03-23 DIAGNOSIS — E7800 Pure hypercholesterolemia, unspecified: Secondary | ICD-10-CM

## 2017-03-23 DIAGNOSIS — R739 Hyperglycemia, unspecified: Secondary | ICD-10-CM | POA: Diagnosis not present

## 2017-03-23 DIAGNOSIS — I10 Essential (primary) hypertension: Secondary | ICD-10-CM | POA: Diagnosis not present

## 2017-03-23 LAB — LIPID PANEL
CHOLESTEROL: 231 mg/dL — AB (ref 0–200)
HDL: 83 mg/dL (ref >39.00)
LDL Cholesterol: 130 mg/dL — ABNORMAL HIGH (ref 0–99)
NonHDL: 147.68
Total CHOL/HDL Ratio: 3
Triglycerides: 86 mg/dL (ref 0.0–149.0)
VLDL: 17.2 mg/dL (ref 0.0–40.0)

## 2017-03-23 LAB — BASIC METABOLIC PANEL
BUN: 19 mg/dL (ref 6–23)
CO2: 30 mEq/L (ref 19–32)
CREATININE: 0.9 mg/dL (ref 0.40–1.20)
Calcium: 9.8 mg/dL (ref 8.4–10.5)
Chloride: 103 mEq/L (ref 96–112)
GFR: 63.25 mL/min (ref >60.00)
Glucose, Bld: 92 mg/dL (ref 70–99)
POTASSIUM: 4.3 meq/L (ref 3.5–5.1)
Sodium: 139 mEq/L (ref 135–145)

## 2017-03-23 LAB — HEMOGLOBIN A1C: HEMOGLOBIN A1C: 5.5 % (ref 4.6–6.5)

## 2017-03-23 LAB — HEPATIC FUNCTION PANEL
ALBUMIN: 4.1 g/dL (ref 3.5–5.2)
ALK PHOS: 54 U/L (ref 39–117)
ALT: 12 U/L (ref 0–35)
AST: 16 U/L (ref 0–37)
Bilirubin, Direct: 0.1 mg/dL (ref 0.0–0.3)
TOTAL PROTEIN: 6.6 g/dL (ref 6.0–8.3)
Total Bilirubin: 0.7 mg/dL (ref 0.2–1.2)

## 2017-03-26 ENCOUNTER — Ambulatory Visit (INDEPENDENT_AMBULATORY_CARE_PROVIDER_SITE_OTHER): Payer: PPO | Admitting: Internal Medicine

## 2017-03-26 ENCOUNTER — Encounter: Payer: Self-pay | Admitting: Internal Medicine

## 2017-03-26 DIAGNOSIS — I10 Essential (primary) hypertension: Secondary | ICD-10-CM

## 2017-03-26 DIAGNOSIS — R739 Hyperglycemia, unspecified: Secondary | ICD-10-CM | POA: Diagnosis not present

## 2017-03-26 DIAGNOSIS — E7800 Pure hypercholesterolemia, unspecified: Secondary | ICD-10-CM

## 2017-03-26 DIAGNOSIS — F439 Reaction to severe stress, unspecified: Secondary | ICD-10-CM

## 2017-03-26 DIAGNOSIS — Z85820 Personal history of malignant melanoma of skin: Secondary | ICD-10-CM | POA: Diagnosis not present

## 2017-03-26 DIAGNOSIS — I471 Supraventricular tachycardia: Secondary | ICD-10-CM

## 2017-03-26 NOTE — Progress Notes (Signed)
Patient ID: Erin [name], female   DOB: June 24, 1930, 84 y.o.   MRN: [id number]   Subjective:    Patient ID: Erin [name], female    DOB: 12-27-1930, 84 y.o.   MRN: [id number]  HPI  Patient here for a scheduled follow up.  She reports she has been doing relatively well.  Recently saw cardiology.  Had heart monitor that revealed bursts of narrow complex tachycardia.  See cardiology note.  Elected to continue low dose aspirin.  Did start metoprolol 12.27m q day.  She reports she is tolerating the medication relatively well.  Does report noticing when she is standing - occasionally feeling a little unsteadiness.  No dizziness or light headedness.  No syncope or near syncope.  No chest pain.  No sob.  Has not noticed increased heart rate or palpitations.  Eating well.  Still walking. No problems walking.  Has f/u with cardiology 04/01/17.     Past Medical History:  Diagnosis Date  . Chest pain 07/13/2016  . History of colon polyps   . Hypercholesterolemia   . Hypertension    Past Surgical History:  Procedure Laterality Date  . APPENDECTOMY  1946  . CATARACT EXTRACTION Bilateral   . DILATION AND CURETTAGE OF UTERUS    . LAMINECTOMY  2000  . release of foraminal stenosis  2003   Family History  Problem Relation Age of Onset  . Breast cancer Maternal Aunt   . Hypertension Mother   . Hypertension Sister   . Heart disease Brother        S/P CABG  . Multiple sclerosis Sister        died 164  Social History   Socioeconomic History  . Marital status: Widowed    Spouse name: None  . Number of children: 4  . Years of education: None  . Highest education level: None  Social Needs  . Financial resource strain: None  . Food insecurity - worry: None  . Food insecurity - inability: None  . Transportation needs - medical: None  . Transportation needs - non-medical: None  Occupational History  . Occupation: retired  Tobacco Use  . Smoking status: Former SResearch scientist (life sciences) . Smokeless tobacco: Never  Used  Substance and Sexual Activity  . Alcohol use: Yes    Alcohol/week: 0.0 oz    Comment: rare  . Drug use: No  . Sexual activity: No  Other Topics Concern  . None  Social History Narrative  . None    Outpatient Encounter Medications as of 03/26/2017  Medication Sig  . aspirin 81 MG tablet Take 81 mg by mouth daily.  . benazepril (LOTENSIN) 40 MG tablet Take 1 tablet (40 mg total) by mouth 2 (two) times daily.  . Calcium Carbonate-Vitamin D (CALCIUM 600/VITAMIN D) 600-400 MG-UNIT per tablet Take 1 tablet by mouth daily.  . felodipine (PLENDIL) 2.5 MG 24 hr tablet Take 1 tablet (2.5 mg total) by mouth daily.  . furosemide (LASIX) 20 MG tablet Take 1 tablet (20 mg total) by mouth daily. (Patient taking differently: Take 20 mg by mouth daily as needed. )  . metoprolol succinate (TOPROL XL) 25 MG 24 hr tablet Take 0.5 tablets (12.5 mg total) by mouth daily.  . Multiple Vitamin (MULTIVITAMIN) tablet Take 1 tablet by mouth daily.  . Omega-3 Fatty Acids (FISH OIL) 1000 MG CAPS Take by mouth daily.  . Red Yeast Rice Extract (RED YEAST RICE PO) Take 2 tablets by mouth.   No facility-administered encounter  medications on file as of 03/26/2017.     Review of Systems  Constitutional: Negative for appetite change and unexpected weight change.  HENT: Negative for congestion and sinus pressure.   Respiratory: Negative for cough, chest tightness and shortness of breath.   Cardiovascular: Negative for chest pain, palpitations and leg swelling.  Gastrointestinal: Negative for abdominal pain, diarrhea, nausea and vomiting.  Genitourinary: Negative for difficulty urinating and dysuria.  Musculoskeletal: Negative for joint swelling and myalgias.  Skin: Negative for color change and rash.  Neurological: Negative for dizziness, light-headedness and headaches.  Psychiatric/Behavioral: Negative for agitation and dysphoric mood.       Objective:     Blood pressure rechecked by me:   134/70  Physical Exam  Constitutional: She appears well-developed and well-nourished. No distress.  HENT:  Nose: Nose normal.  Mouth/Throat: Oropharynx is clear and moist.  Neck: Neck supple. No thyromegaly present.  Cardiovascular: Normal rate and regular rhythm.  Pulmonary/Chest: Breath sounds normal. No respiratory distress. She has no wheezes.  Abdominal: Soft. Bowel sounds are normal. There is no tenderness.  Musculoskeletal: She exhibits no edema or tenderness.  Lymphadenopathy:    She has no cervical adenopathy.  Skin: No rash noted. No erythema.  Psychiatric: She has a normal mood and affect. Her behavior is normal.    BP 134/70   Pulse (!) 54   Temp (!) 97.5 F (36.4 C) (Oral)   Ht 5' (1.524 m)   Wt 127 lb 6.4 oz (57.8 kg)   SpO2 95%   BMI 24.88 kg/m  Wt Readings from Last 3 Encounters:  03/26/17 127 lb 6.4 oz (57.8 kg)  11/24/16 124 lb 8 oz (56.5 kg)  11/24/16 124 lb 6.4 oz (56.4 kg)     Lab Results  Component Value Date   WBC 4.9 11/20/2016   HGB 14.4 11/20/2016   HCT 42.3 11/20/2016   PLT 246.0 11/20/2016   GLUCOSE 92 03/23/2017   CHOL 231 (H) 03/23/2017   TRIG 86.0 03/23/2017   HDL 83.00 03/23/2017   LDLCALC 130 (H) 03/23/2017   ALT 12 03/23/2017   AST 16 03/23/2017   NA 139 03/23/2017   K 4.3 03/23/2017   CL 103 03/23/2017   CREATININE 0.90 03/23/2017   BUN 19 03/23/2017   CO2 30 03/23/2017   TSH 1.12 11/20/2016   HGBA1C 5.5 03/23/2017    Nm Myocar Multi W/spect W/wall Motion / Ef  Result Date: 07/13/2016  Blood pressure demonstrated a normal response to exercise.  There was no ST segment deviation noted during stress.  No T wave inversion was noted during stress.  The study is normal.  This is a low risk study.  The left ventricular ejection fraction is hyperdynamic (>65%).        Assessment & Plan:   Problem List Items Addressed This Visit    History of melanoma    Followed by dermatology.  Recently evaluated.  Had squamous cell  removed from scalp.  Follow.       Hypercholesterolemia    Discussed cholesterol results.  Discussed calculated risk.  Discussed starting a statin medication.  She declines.  Low cholesterol diet and exercise.  Follow.        Relevant Orders   Hepatic function panel   Lipid panel   Hyperglycemia    Low carb diet and exercise.  Follow met b and a1c.        Relevant Orders   Hemoglobin A1c   Hypertension    Blood  pressure on recheck improved.  Continue same medication regimen.  Follow pressures.  Follow metabolic panel.        Relevant Orders   Basic metabolic panel   Narrow complex tachycardia (HCC)    Recently found on monitor.  On aspirin.  Started low dose metoprolol.  Has noticed when standing, being a little more unsteady.  She was questioning if related to metoprolol.  No syncope, near syncope or dizziness.  Not orthostatic on exam.  Discussed PT for gait evaluation and core strengthening.  Hold on changing medication.  Has f/u with cardiology next week.        Stress    Handling stress.  Has good support.  Follow.           Einar Pheasant, MD

## 2017-03-26 NOTE — Progress Notes (Signed)
Pre visit review using our clinic review tool, if applicable. No additional management support is needed unless otherwise documented below in the visit note. 

## 2017-03-28 ENCOUNTER — Encounter: Payer: Self-pay | Admitting: Internal Medicine

## 2017-03-28 DIAGNOSIS — I471 Supraventricular tachycardia: Secondary | ICD-10-CM | POA: Insufficient documentation

## 2017-03-28 DIAGNOSIS — I4719 Other supraventricular tachycardia: Secondary | ICD-10-CM | POA: Insufficient documentation

## 2017-03-28 NOTE — Assessment & Plan Note (Signed)
Followed by dermatology.  Recently evaluated.  Had squamous cell removed from scalp.  Follow.

## 2017-03-28 NOTE — Assessment & Plan Note (Signed)
Low carb diet and exercise.  Follow met b and a1c.   

## 2017-03-28 NOTE — Assessment & Plan Note (Signed)
Blood pressure on recheck improved.  Continue same medication regimen.  Follow pressures.  Follow metabolic panel.   

## 2017-03-28 NOTE — Assessment & Plan Note (Signed)
Recently found on monitor.  On aspirin.  Started low dose metoprolol.  Has noticed when standing, being a little more unsteady.  She was questioning if related to metoprolol.  No syncope, near syncope or dizziness.  Not orthostatic on exam.  Discussed PT for gait evaluation and core strengthening.  Hold on changing medication.  Has f/u with cardiology next week.

## 2017-03-28 NOTE — Assessment & Plan Note (Signed)
Handling stress.  Has good support.  Follow.   

## 2017-03-28 NOTE — Assessment & Plan Note (Signed)
Discussed cholesterol results.  Discussed calculated risk.  Discussed starting a statin medication.  She declines.  Low cholesterol diet and exercise.  Follow.

## 2017-03-31 NOTE — Progress Notes (Signed)
Cardiology Office Note Date:  04/01/2017  Patient ID:  Erin [name], Erin [name] 08/30/1930, MRN [id number] PCP:  Einar Pheasant, MD  Cardiologist:  Dr. Saunders Revel, MD    Chief Complaint: Follow up PACs  History of Present Illness: Erin [name] is a 84 y.o. female with history of atrial arrhythmia, HTN, and HLD who presents for follow up of PACs.  She was previously followed by Dr. Yvone Neu, though has transitioned to Dr. Saunders Revel. Prior Myoview on 07/13/2016 showed no ischemia or scar, EF > 65%, decreased exercise capacity exercising 4 minutes 0 seconds, achieving 117 bpm (90% MPHR, 4.6 METs). Event monitor 07/07/2016 showed predominant sinus rhythm with 20 episodes of narrow complex tachycardia and irregularity consistent with Afib (longest episode noted approximately 12 seconds), frequent PACs and rare PVCs were noted. TTE 08/06/2016 showed an EF of 55-65%, normal WM, Gr1DD, mild left atrial enalrgement, normal RV size and function. Event monitor 10/07/2016 showed predominant sinus rhythm with frequent PACs and occasional PVCs, single episode of NSVT, multiple atrial runs lasting up to 19.4 seconds, no sustained arrhythmias. Anticoagulation had previously been discussed with the patient by Dr. Yvone Neu, though the patient had been hesitant to initiate anticoagulation leading to the repating of outpatient cardiac monitoring in 09/2016 as above. She was most recently seen by Dr. Saunders Revel on 11/24/16 and was doing well at that time. Given her repeat cardiac imaging favored a type of atrial tachycardia, therapeutic anticoagulation was deferred. She was continued on ASA 81 mg daily and she was started on Toprol XL 12.5 mg daily.   Labs from 03/23/2017: Na 139, K+ 4.3, BUN 19, SCr 0.90, glucose 92, LDL 130, unremarkable liver function, A1c 5.5.  TSH from 11/20/2016 normal (1.12).  CBC from 11/20/2016 unremarkable.   Seen by PCP 03/26/2017 for routine follow up. Noted to be tolerating Toprol relatively well. BP 134/70 with HR 54 bpm. Did  note some positional unsteadiness at that time. No syncope, chest pain, SOB, or palpitations.   She comes in doing well from a cardiac perspective today. She denies any chest pain, palpitations, SOB, dizziness, nausea, vomiting, presyncope, or syncope. She seems to be tolerating Toprol XL without issues. Blood pressure at home has been running in the 347-425 mmHg systolic range. Heart rate has been running in the upper 50s to 60s bpm on Toprol XL. She remains on ASA without any bleeding. She does note some unsteadiness on her feet if she stands in some spot for a prolonged period. She denies this being described as dizziness or vertigo. No positional dizziness. No palpitations. Using a cane for these episodes.    Past Medical History:  Diagnosis Date  . Chest pain 07/13/2016  . History of colon polyps   . Hypercholesterolemia   . Hypertension     Past Surgical History:  Procedure Laterality Date  . APPENDECTOMY  1946  . CATARACT EXTRACTION Bilateral   . DILATION AND CURETTAGE OF UTERUS    . LAMINECTOMY  2000  . release of foraminal stenosis  2003    Current Meds  Medication Sig  . aspirin 81 MG tablet Take 81 mg by mouth daily.  . benazepril (LOTENSIN) 40 MG tablet Take 1 tablet (40 mg total) by mouth 2 (two) times daily.  . Calcium Carbonate-Vitamin D (CALCIUM 600/VITAMIN D) 600-400 MG-UNIT per tablet Take 1 tablet by mouth daily.  . felodipine (PLENDIL) 2.5 MG 24 hr tablet Take 1 tablet (2.5 mg total) by mouth daily.  . furosemide (LASIX) 20 MG tablet  Take 1 tablet (20 mg total) by mouth daily. (Patient taking differently: Take 20 mg by mouth daily as needed. )  . metoprolol succinate (TOPROL XL) 25 MG 24 hr tablet Take 0.5 tablets (12.5 mg total) by mouth daily.  . Multiple Vitamin (MULTIVITAMIN) tablet Take 1 tablet by mouth daily.  . Omega-3 Fatty Acids (FISH OIL) 1000 MG CAPS Take by mouth daily.  . Red Yeast Rice Extract (RED YEAST RICE PO) Take 2 tablets by mouth.     Allergies:   Erythromycin and Minocin [minocycline hcl]   Social History:  The patient  reports that she has quit smoking. she has never used smokeless tobacco. She reports that she drinks alcohol. She reports that she does not use drugs.   Family History:  The patient's family history includes Breast cancer in her maternal aunt; Heart disease in her brother; Hypertension in her mother and sister; Multiple sclerosis in her sister.  ROS:   Review of Systems  Constitutional: Positive for malaise/fatigue. Negative for chills, diaphoresis, fever and weight loss.  HENT: Negative for congestion.   Eyes: Negative for discharge and redness.  Respiratory: Negative for cough, hemoptysis, sputum production, shortness of breath and wheezing.   Cardiovascular: Negative for chest pain, palpitations, orthopnea, claudication, leg swelling and PND.  Gastrointestinal: Negative for abdominal pain, blood in stool, heartburn, melena, nausea and vomiting.  Genitourinary: Negative for hematuria.  Musculoskeletal: Negative for falls and myalgias.  Skin: Negative for rash.  Neurological: Negative for dizziness, tingling, tremors, sensory change, speech change, focal weakness, seizures, loss of consciousness, weakness and headaches.       Unsteady on her feet if standing for prolonged period.   Endo/Heme/Allergies: Does not bruise/bleed easily.  Psychiatric/Behavioral: Negative for substance abuse. The patient is not nervous/anxious.   All other systems reviewed and are negative.    PHYSICAL EXAM:  VS:  BP 140/70 (BP Location: Left Arm, Patient Position: Sitting, Cuff Size: Normal)   Pulse 62   Ht 5' (1.524 m)   Wt 128 lb 4 oz (58.2 kg)   BMI 25.05 kg/m  BMI: Body mass index is 25.05 kg/m.  Physical Exam  Constitutional: She is oriented to person, place, and time. She appears well-developed and well-nourished.  Elderly appearing  HENT:  Head: Normocephalic and atraumatic.  Eyes: Right eye exhibits  no discharge. Left eye exhibits no discharge.  Neck: Normal range of motion. No JVD present.  Cardiovascular: Normal rate, S1 normal, S2 normal and normal heart sounds. An irregular rhythm present. Exam reveals no distant heart sounds, no friction rub, no midsystolic click and no opening snap.  No murmur heard. Pulses:      Posterior tibial pulses are 2+ on the right side, and 2+ on the left side.  Pulmonary/Chest: Effort normal and breath sounds normal. No respiratory distress. She has no decreased breath sounds. She has no wheezes. She has no rales. She exhibits no tenderness.  Abdominal: Soft. She exhibits no distension. There is no tenderness.  Musculoskeletal: She exhibits no edema.  Neurological: She is alert and oriented to person, place, and time.  Skin: Skin is warm and dry. No cyanosis. Nails show no clubbing.  Psychiatric: She has a normal mood and affect. Her speech is normal and behavior is normal. Judgment and thought content normal.     EKG:  Was ordered and interpreted by me today. Shows sinus rhythm with PACs, 62 bpm, no acute st/t changes. Rhythm strip shows NSR with frequent PACs  Recent Labs: 11/20/2016:  Hemoglobin 14.4; Platelets 246.0; TSH 1.12 03/23/2017: ALT 12; BUN 19; Creatinine, Ser 0.90; Potassium 4.3; Sodium 139  03/23/2017: Cholesterol 231; HDL 83.00; LDL Cholesterol 130; Total CHOL/HDL Ratio 3; Triglycerides 86.0; VLDL 17.2   Estimated Creatinine Clearance: 36.5 mL/min (by C-G formula based on SCr of 0.9 mg/dL).   Wt Readings from Last 3 Encounters:  04/01/17 128 lb 4 oz (58.2 kg)  03/26/17 127 lb 6.4 oz (57.8 kg)  11/24/16 124 lb 8 oz (56.5 kg)     Other studies reviewed: Additional studies/records reviewed today include: summarized above  ASSESSMENT AND PLAN:  1. PACs: Asymptomatic. No evidence of Afib on 12-lead EKG or rhythm strip today. Tolerating low-dose Toprol XL 12.5 mg daily without issue. Given her history of mild sinus bradycardia (noted at PCP  visit earlier this month) and with home heart rate readings in the upper 50s to 60s bpm there is not room to titrate her Toprol XL at this time. Given that she is completely asymptomatic with her PACs it is reasonable to continue current dose of Toprol XL. Recent tsh normal and potassium at goal. Check a magnesium level today. She does not want to wear a 24-hour Holter to quantify PACs at this time. If PACs become symptomatic could consider calcium channel blocker in place of beta blocker.   2. HTN: BP is mildly elevated today at 425 mmHg systolic. Home readings are typically in the 956-387 systolic range. Continue current dose of Toprol XL as above.   3. Weakness: Notes unsteadiness on her feet if she is standing in one place for a prolonged period. No associated dizziness, vertigo, palpitations, SOB, chest pain, diaphoresis, nausea, vomiting, presyncope, or syncope. Doubt this is related to her PACs. She plans to follow up with ENT. Consider PT. Using a cane.   Disposition: F/u with Dr. Saunders Revel in 2 months.   Current medicines are reviewed at length with the patient today.  The patient did not have any concerns regarding medicines.  Signed, Christell Faith, PA-C 04/01/2017 9:43 AM     [facility] [address] [place] [address] [place], [place] [address] [phone]

## 2017-04-01 ENCOUNTER — Ambulatory Visit: Payer: PPO | Admitting: Physician Assistant

## 2017-04-01 ENCOUNTER — Encounter: Payer: Self-pay | Admitting: Physician Assistant

## 2017-04-01 VITALS — BP 140/70 | HR 62 | Ht 60.0 in | Wt 128.2 lb

## 2017-04-01 DIAGNOSIS — I10 Essential (primary) hypertension: Secondary | ICD-10-CM

## 2017-04-01 DIAGNOSIS — R2681 Unsteadiness on feet: Secondary | ICD-10-CM | POA: Diagnosis not present

## 2017-04-01 DIAGNOSIS — I498 Other specified cardiac arrhythmias: Secondary | ICD-10-CM | POA: Diagnosis not present

## 2017-04-01 DIAGNOSIS — I491 Atrial premature depolarization: Secondary | ICD-10-CM

## 2017-04-01 NOTE — Patient Instructions (Signed)
Medication Instructions: - Your physician recommends that you continue on your current medications as directed. Please refer to the Current Medication list given to you today.  Labwork: - Your physician recommends that you have lab work today: magensium  Procedures/Testing: - none ordered  Follow-Up: - Your physician recommends that you schedule a follow-up appointment in: 2 months with Dr. Saunders Revel   Any Additional Special Instructions Will Be Listed Below (If Applicable).     If you need a refill on your cardiac medications before your next appointment, please call your pharmacy.

## 2017-04-02 LAB — MAGNESIUM: Magnesium: 2.7 mg/dL — ABNORMAL HIGH (ref 1.6–2.3)

## 2017-04-19 DIAGNOSIS — R42 Dizziness and giddiness: Secondary | ICD-10-CM | POA: Diagnosis not present

## 2017-04-19 DIAGNOSIS — H6122 Impacted cerumen, left ear: Secondary | ICD-10-CM | POA: Diagnosis not present

## 2017-04-22 DIAGNOSIS — C44629 Squamous cell carcinoma of skin of left upper limb, including shoulder: Secondary | ICD-10-CM | POA: Diagnosis not present

## 2017-04-22 DIAGNOSIS — L905 Scar conditions and fibrosis of skin: Secondary | ICD-10-CM | POA: Diagnosis not present

## 2017-04-23 ENCOUNTER — Ambulatory Visit (INDEPENDENT_AMBULATORY_CARE_PROVIDER_SITE_OTHER): Payer: PPO

## 2017-04-23 VITALS — BP 108/64 | HR 60 | Temp 97.9°F | Resp 14 | Ht 59.0 in | Wt 128.8 lb

## 2017-04-23 DIAGNOSIS — Z0000 Encounter for general adult medical examination without abnormal findings: Secondary | ICD-10-CM | POA: Diagnosis not present

## 2017-04-23 NOTE — Patient Instructions (Addendum)
  Ms. Tomson , Thank you for taking time to come for your Medicare Wellness Visit. I appreciate your ongoing commitment to your health goals. Please review the following plan we discussed and let me know if I can assist you in the future.   Follow up with Dr. Nicki Reaper as needed.    Bring a copy of your [place] and/or Living Will to be scanned into chart.  Have a great day!  These are the goals we discussed: Goals    . Healthy Lifestyle     Maintain exercise regimen Stay hydrated! Low cholesterol foods       This is a list of the screening recommended for you and due dates:  Health Maintenance  Topic Date Due  . Tetanus Vaccine  06/01/2022  . Flu Shot  Completed  . DEXA scan (bone density measurement)  Completed  . Pneumonia vaccines  Completed  . Mammogram  Discontinued

## 2017-04-23 NOTE — Progress Notes (Signed)
Subjective:   Erin [name] is a 84 y.o. female who presents for Medicare Annual (Subsequent) preventive examination.  Review of Systems:  No ROS.  Medicare Wellness Visit. Additional risk factors are reflected in the social history.  Cardiac Risk Factors include: advanced age (>24men, >10 women);hypertension     Objective:     Vitals: BP 108/64 (BP Location: Left Arm, Patient Position: Sitting, Cuff Size: Normal)   Pulse 60   Temp 97.9 F (36.6 C) (Oral)   Resp 14   Ht 4\' 11"  (1.499 m)   Wt 128 lb 12.8 oz (58.4 kg)   SpO2 95%   BMI 26.01 kg/m   Body mass index is 26.01 kg/m.  Advanced Directives 04/23/2017 06/11/2016 04/22/2016 11/13/2015 04/23/2015  Does Patient Have a Medical Advance Directive? Yes Yes Yes Yes Yes  Type of Paramedic of [place];Living will [place];Living will [place];Living will Living will;Healthcare Power of [place];Living will  Does patient want to make changes to medical advance directive? No - Patient declined - No - Patient declined - No - Patient declined  Copy of [place] in Chart? Yes No - copy requested No - copy requested - No - copy requested    Tobacco Social History   Tobacco Use  Smoking Status Former Smoker  Smokeless Tobacco Never Used     Counseling given: Not Answered   Clinical Intake:  Pre-visit preparation completed: Yes  Pain : No/denies pain     Nutritional Status: BMI of 19-24  Normal Diabetes: No  How often do you need to have someone help you when you read instructions, pamphlets, or other written materials from your doctor or pharmacy?: 1 - Never  Interpreter Needed?: No     Past Medical History:  Diagnosis Date  . Chest pain 07/13/2016  . History of colon polyps   . Hypercholesterolemia   . Hypertension    Past Surgical History:  Procedure Laterality Date  . APPENDECTOMY  1946  . CATARACT  EXTRACTION Bilateral   . DILATION AND CURETTAGE OF UTERUS    . LAMINECTOMY  2000  . release of foraminal stenosis  2003   Family History  Problem Relation Age of Onset  . Breast cancer Maternal Aunt   . Hypertension Mother   . Hypertension Sister   . Heart disease Brother        S/P CABG  . Multiple sclerosis Sister        died 21   Social History   Socioeconomic History  . Marital status: Widowed    Spouse name: None  . Number of children: 4  . Years of education: None  . Highest education level: None  Social Needs  . Financial resource strain: None  . Food insecurity - worry: None  . Food insecurity - inability: None  . Transportation needs - medical: None  . Transportation needs - non-medical: None  Occupational History  . Occupation: retired  Tobacco Use  . Smoking status: Former Research scientist (life sciences)  . Smokeless tobacco: Never Used  Substance and Sexual Activity  . Alcohol use: Yes    Alcohol/week: 0.0 oz    Comment: rare  . Drug use: No  . Sexual activity: No  Other Topics Concern  . None  Social History Narrative  . None    Outpatient Encounter Medications as of 04/23/2017  Medication Sig  . aspirin 81 MG tablet Take 81 mg by mouth daily.  Marland Kitchen  benazepril (LOTENSIN) 40 MG tablet Take 1 tablet (40 mg total) by mouth 2 (two) times daily.  . Calcium Carbonate-Vitamin D (CALCIUM 600/VITAMIN D) 600-400 MG-UNIT per tablet Take 1 tablet by mouth daily.  . felodipine (PLENDIL) 2.5 MG 24 hr tablet Take 1 tablet (2.5 mg total) by mouth daily.  . furosemide (LASIX) 20 MG tablet Take 1 tablet (20 mg total) by mouth daily. (Patient taking differently: Take 20 mg by mouth daily as needed. )  . metoprolol succinate (TOPROL XL) 25 MG 24 hr tablet Take 0.5 tablets (12.5 mg total) by mouth daily.  . Multiple Vitamin (MULTIVITAMIN) tablet Take 1 tablet by mouth daily.  . Omega-3 Fatty Acids (FISH OIL) 1000 MG CAPS Take by mouth daily.  . Red Yeast Rice Extract (RED YEAST RICE PO) Take 2  tablets by mouth.   No facility-administered encounter medications on file as of 04/23/2017.     Activities of Daily Living In your present state of health, do you have any difficulty performing the following activities: 04/23/2017  Hearing? Y  Comment hearing aid  Vision? N  Difficulty concentrating or making decisions? N  Walking or climbing stairs? Y  Comment Unsteady gait at times.  Uses a walking stick when walking long distances.  Dressing or bathing? N  Doing errands, shopping? N  Preparing Food and eating ? N  Using the Toilet? N  In the past six months, have you accidently leaked urine? N  Do you have problems with loss of bowel control? N  Managing your Medications? N  Managing your Finances? N  Housekeeping or managing your Housekeeping? N  Some recent data might be hidden    Patient Care Team: Einar Pheasant, MD as PCP - General (Internal Medicine)    Assessment:   This is a routine wellness examination for Erin [name].  The goal of the wellness visit is to assist the patient how to close the gaps in care and create a preventative care plan for the patient.   The roster of all physicians providing medical care to patient is listed in the Snapshot section of the chart.  Taking calcium VIT D as appropriate/Osteoporosis risk reviewed.    Safety issues reviewed; Smoke and carbon monoxide detectors in the home. No firearms or firearms locked in a safe within the home. Wears seatbelts when driving or riding with others. No violence in the home.  They do not have excessive sun exposure.  Discussed the need for sun protection: hats, long sleeves and the use of sunscreen if there is significant sun exposure.  Depression- PHQ 2 &9 complete.  There are no signs/symptoms or verbal communication regarding depression, irritability, anhedonia, sadness/tearfullness. See depression screening.    Patient is alert, normal appearance, oriented to person/place/and time.  Correctly  identified the president of the [place] and recalls of 2/3 words. Performs simple calculations and can read correct time from watch face.  Displays appropriate judgement.  No new identified risk were noted.  No failures at ADL's or IADL's.    BMI- discussed the importance of a healthy diet, water intake and the benefits of aerobic exercise. Educational material provided.   24 hour diet recall: Regular diet  Daily fluid intake: 3 cups of caffeine, 3-5 cups of water  Dental- every 4 months.  Eye- Visual acuity not assessed per patient preference since they have regular follow up with the ophthalmologist.  Wears corrective lenses.  Sleep patterns- Sleeps 6-8 hours at night.  Wakes feeling rested. Naps during the  day.  Health maintenance gaps- closed.  Patient Concerns: None at this time. Follow up with PCP as needed.  Exercise Activities and Dietary recommendations Current Exercise Habits: Home exercise routine, Time (Minutes): 45, Frequency (Times/Week): 5, Weekly Exercise (Minutes/Week): 225  Goals    . Healthy Lifestyle     Maintain exercise regimen Stay hydrated! Low cholesterol foods       Fall Risk Fall Risk  04/23/2017 03/26/2017 04/22/2016 11/13/2015 04/23/2015  Falls in the past year? No No No No No   Depression Screen PHQ 2/9 Scores 04/23/2017 03/26/2017 04/22/2016 11/13/2015  PHQ - 2 Score 0 0 0 0     Cognitive Function MMSE - Mini Mental State Exam 04/23/2015  Orientation to time 5  Orientation to Place 5  Registration 3  Attention/ Calculation 5  Recall 3  Language- name 2 objects 2  Language- repeat 1  Language- follow 3 step command 3  Language- read & follow direction 1  Write a sentence 1  Copy design 1  Total score 30     6CIT Screen 04/23/2017 04/22/2016  What Year? 0 points 0 points  What month? 0 points 0 points  What time? 0 points 0 points  Count back from 20 0 points 0 points  Months in reverse 0 points 0 points  Repeat phrase 0 points 0 points    Total Score 0 0    Immunization History  Administered Date(s) Administered  . Influenza Split 12/05/2013  . Influenza, High Dose Seasonal PF 11/24/2016  . Influenza,inj,Quad PF,6+ Mos 11/30/2012  . Influenza-Unspecified 11/14/2013, 12/11/2014, 12/06/2015  . Pneumococcal Conjugate-13 10/24/2014  . Pneumococcal Polysaccharide-23 11/13/2015  . Tdap 05/31/2012  . Zoster 02/14/2011  . Zoster Recombinat (Shingrix) 12/23/2016, 03/26/2017    Screening Tests Health Maintenance  Topic Date Due  . TETANUS/TDAP  06/01/2022  . INFLUENZA VACCINE  Completed  . DEXA SCAN  Completed  . PNA vac Low Risk Adult  Completed  . MAMMOGRAM  Discontinued      Plan:    End of life planning; Advance aging; Advanced directives discussed. Copy of current HCPOA/Living Will on file.    I have personally reviewed and noted the following in the patient's chart:   . Medical and social history . Use of alcohol, tobacco or illicit drugs  . Current medications and supplements . Functional ability and status . Nutritional status . Physical activity . Advanced directives . List of other physicians . Hospitalizations, surgeries, and ER visits in previous 12 months . Vitals . Screenings to include cognitive, depression, and falls . Referrals and appointments  In addition, I have reviewed and discussed with patient certain preventive protocols, quality metrics, and best practice recommendations. A written personalized care plan for preventive services as well as general preventive health recommendations were provided to patient.     Varney Biles, LPN  03/23/8414   Reviewed above information.  Agree with assessment and plan.    Dr Nicki Reaper

## 2017-05-04 DIAGNOSIS — R42 Dizziness and giddiness: Secondary | ICD-10-CM | POA: Diagnosis not present

## 2017-05-06 DIAGNOSIS — T8140XA Infection following a procedure, unspecified, initial encounter: Secondary | ICD-10-CM | POA: Diagnosis not present

## 2017-05-10 DIAGNOSIS — R42 Dizziness and giddiness: Secondary | ICD-10-CM | POA: Diagnosis not present

## 2017-05-20 DIAGNOSIS — R42 Dizziness and giddiness: Secondary | ICD-10-CM | POA: Diagnosis not present

## 2017-05-27 DIAGNOSIS — R42 Dizziness and giddiness: Secondary | ICD-10-CM | POA: Diagnosis not present

## 2017-06-03 DIAGNOSIS — L300 Nummular dermatitis: Secondary | ICD-10-CM | POA: Diagnosis not present

## 2017-06-03 DIAGNOSIS — X32XXXA Exposure to sunlight, initial encounter: Secondary | ICD-10-CM | POA: Diagnosis not present

## 2017-06-03 DIAGNOSIS — L570 Actinic keratosis: Secondary | ICD-10-CM | POA: Diagnosis not present

## 2017-06-03 DIAGNOSIS — L821 Other seborrheic keratosis: Secondary | ICD-10-CM | POA: Diagnosis not present

## 2017-06-10 DIAGNOSIS — R42 Dizziness and giddiness: Secondary | ICD-10-CM | POA: Diagnosis not present

## 2017-06-16 ENCOUNTER — Ambulatory Visit: Payer: PPO | Admitting: Internal Medicine

## 2017-06-16 ENCOUNTER — Encounter: Payer: Self-pay | Admitting: Internal Medicine

## 2017-06-16 VITALS — BP 130/80 | HR 72 | Ht 60.0 in | Wt 125.5 lb

## 2017-06-16 DIAGNOSIS — I498 Other specified cardiac arrhythmias: Secondary | ICD-10-CM | POA: Diagnosis not present

## 2017-06-16 DIAGNOSIS — I493 Ventricular premature depolarization: Secondary | ICD-10-CM | POA: Diagnosis not present

## 2017-06-16 DIAGNOSIS — I10 Essential (primary) hypertension: Secondary | ICD-10-CM

## 2017-06-16 NOTE — Patient Instructions (Signed)
Medication Instructions:  Your physician recommends that you continue on your current medications as directed. Please refer to the Current Medication list given to you today.  Ask your PCP about stopping aspirin.  Labwork: none  Testing/Procedures: none  Follow-Up: Your physician wants you to follow-up in: 12 MONTHS WITH DR END. You will receive a reminder letter in the mail two months in advance. If you don't receive a letter, please call our office to schedule the follow-up appointment.   If you need a refill on your cardiac medications before your next appointment, please call your pharmacy.

## 2017-06-16 NOTE — Progress Notes (Signed)
Follow-up Outpatient Visit Date: 06/16/2017  Primary Care Provider: Einar Pheasant, [facility] [address] [address]  Chief Complaint: Follow-up atrial arrhythmia  HPI:  Erin [name] is a 84 y.o. year-old female with history of atrial arrhythmia, hypertension, and hyperlipidemia, who presents for follow-up of atrial arrhythmia. She was last seen 2 months ago by Christell Faith, PA. She was doing well at that time.  Today, Erin [name] is without complaints, denying chest pain, shortness of breath, palpitations, and lightheadedness.  She takes furosemide 3-4 times per week when her abdomen feels full.  Her weight has been stable.  She denies significant edema.  She walks 1-2 miles/day, as the weather allows.  He is doing physical therapy to help her balance.  She has not had any falls.  --------------------------------------------------------------------------------------------------  Cardiovascular History & Procedures: Cardiovascular Problems:  Paroxysmal atrial arrhythmia  Atypical chest pain  Risk Factors:  Hypertension, hyperlipidemia, and age greater than 46  Cath/PCI:  None  CV Surgery:  None  EP Procedures and Devices:  Event monitor (10/07/16): Predominantly sinus rhythm with frequent PACs and occasional PVCs. Single episode of NSVT. Multiple atrial runs lasting up to 19.4 seconds. No sustained arrhythmias.  Event monitor (07/07/16): Predominantly sinus rhythm with 20 episodes of narrow complex tachycardia and irregularity consistent with atrial fibrillation (longest episode noted approximately 12 seconds). Frequent PACs and rare PVCs were noted.  Non-Invasive Evaluation(s):  TTE (08/06/16): Normal LV size with LVEF of 55-65%. Normal wall motion with grade 1 diastolic dysfunction. Mild left atrial enlargement. Normal RV size and function.  Exercise MPI (07/13/16): Low risk study without ischemia or scar. LVEF greater than 65%. Decreased exercise  capacity, exercising 4 minutes, zero seconds, achieving 117 bpm (90% MPHR; 4.6 METs).   Recent CV Pertinent Labs: Lab Results  Component Value Date   CHOL 231 (H) 03/23/2017   HDL 83.00 03/23/2017   LDLCALC 130 (H) 03/23/2017   TRIG 86.0 03/23/2017   CHOLHDL 3 03/23/2017   K 4.3 03/23/2017   K 3.5 12/21/2011   MG 2.7 (H) 04/01/2017   BUN 19 03/23/2017   CREATININE 0.90 03/23/2017   CREATININE 0.84 12/28/2011    Past medical and surgical history were reviewed and updated in EPIC.  Current Meds  Medication Sig  . aspirin 81 MG tablet Take 81 mg by mouth daily.  . benazepril (LOTENSIN) 40 MG tablet Take 1 tablet (40 mg total) by mouth 2 (two) times daily.  . Calcium Carbonate-Vitamin D (CALCIUM 600/VITAMIN D) 600-400 MG-UNIT per tablet Take 1 tablet by mouth daily.  . felodipine (PLENDIL) 2.5 MG 24 hr tablet Take 1 tablet (2.5 mg total) by mouth daily.  . furosemide (LASIX) 20 MG tablet Take 1 tablet (20 mg total) by mouth daily. (Patient taking differently: Take 20 mg by mouth daily as needed. )  . metoprolol succinate (TOPROL XL) 25 MG 24 hr tablet Take 0.5 tablets (12.5 mg total) by mouth daily.  . Multiple Vitamin (MULTIVITAMIN) tablet Take 1 tablet by mouth daily.  . Omega-3 Fatty Acids (FISH OIL) 1000 MG CAPS Take by mouth daily.  . Red Yeast Rice Extract (RED YEAST RICE PO) Take 2 tablets by mouth.    Allergies: Erythromycin and Minocin [minocycline hcl]  Social History   Socioeconomic History  . Marital status: Widowed    Spouse name: Not on file  . Number of children: 4  . Years of education: Not on file  . Highest education level: Not on file  Occupational  History  . Occupation: retired  Scientific laboratory technician  . Financial resource strain: Not on file  . Food insecurity:    Worry: Not on file    Inability: Not on file  . Transportation needs:    Medical: Not on file    Non-medical: Not on file  Tobacco Use  . Smoking status: Former Research scientist (life sciences)  . Smokeless tobacco:  Never Used  Substance and Sexual Activity  . Alcohol use: Yes    Alcohol/week: 0.0 oz    Comment: rare  . Drug use: No  . Sexual activity: Never  Lifestyle  . Physical activity:    Days per week: Not on file    Minutes per session: Not on file  . Stress: Not on file  Relationships  . Social connections:    Talks on phone: Not on file    Gets together: Not on file    Attends religious service: Not on file    Active member of club or organization: Not on file    Attends meetings of clubs or organizations: Not on file    Relationship status: Not on file  . Intimate partner violence:    Fear of current or ex partner: Not on file    Emotionally abused: Not on file    Physically abused: Not on file    Forced sexual activity: Not on file  Other Topics Concern  . Not on file  Social History Narrative  . Not on file    Family History  Problem Relation Age of Onset  . Breast cancer Maternal Aunt   . Hypertension Mother   . Hypertension Sister   . Heart disease Brother        S/P CABG  . Multiple sclerosis Sister        died 59    Review of Systems: A 12-system review of systems was performed and was negative except as noted in the HPI.  --------------------------------------------------------------------------------------------------  Physical Exam: BP 130/80 (BP Location: Left Arm, Patient Position: Sitting, Cuff Size: Normal)   Pulse 72   Ht 5' (1.524 m)   Wt 125 lb 8 oz (56.9 kg)   BMI 24.51 kg/m   General:  NAD HEENT: No conjunctival pallor or scleral icterus. Moist mucous membranes.  OP clear. Neck: Supple without lymphadenopathy, thyromegaly, JVD, or HJR. Lungs: Normal work of breathing. Clear to auscultation bilaterally without wheezes or crackles. Heart: Regular rate and rhythm with occasional extrasystoles.  No murmurs, rubs, or gallops. Non-displaced PMI. Abd: Bowel sounds present. Soft, NT/ND without hepatosplenomegaly Ext: No lower extremity edema.  Radial, PT, and DP pulses are 2+ bilaterally. Skin: Warm and dry without rash.  EKG:  NSR with PVCs.   Lab Results  Component Value Date   WBC 4.9 11/20/2016   HGB 14.4 11/20/2016   HCT 42.3 11/20/2016   MCV 98.5 11/20/2016   PLT 246.0 11/20/2016    Lab Results  Component Value Date   NA 139 03/23/2017   K 4.3 03/23/2017   CL 103 03/23/2017   CO2 30 03/23/2017   BUN 19 03/23/2017   CREATININE 0.90 03/23/2017   GLUCOSE 92 03/23/2017   ALT 12 03/23/2017    Lab Results  Component Value Date   CHOL 231 (H) 03/23/2017   HDL 83.00 03/23/2017   LDLCALC 130 (H) 03/23/2017   TRIG 86.0 03/23/2017   CHOLHDL 3 03/23/2017    --------------------------------------------------------------------------------------------------  ASSESSMENT AND PLAN: Atrial arrhythmia and PVC's Today, only PVC's are noted on EKG, though Ms.  [name] has had PAC's, PVC's, and brief atrial runs in the past.  She remains asymptomatic.  There has been no clear evidence of atrial fibrillation.  I recommend that she continue to take low-dose metoprolol.  Given her age and lack of ASCVD, I have recommended that Erin [name] stop taking aspirin for primary prevention.  She wishes to discuss this with Dr. Nicki Reaper before making the change.  Essential hypertension Blood pressure is borderline elevated.  No medication changes at this time.  I will defer ongoing management to Dr. Nicki Reaper.  Follow-up: Return to clinic in 1 year.  Nelva Bush, MD 06/16/2017 10:25 PM

## 2017-07-09 DIAGNOSIS — L438 Other lichen planus: Secondary | ICD-10-CM | POA: Diagnosis not present

## 2017-07-12 DIAGNOSIS — H903 Sensorineural hearing loss, bilateral: Secondary | ICD-10-CM | POA: Diagnosis not present

## 2017-07-22 ENCOUNTER — Other Ambulatory Visit (INDEPENDENT_AMBULATORY_CARE_PROVIDER_SITE_OTHER): Payer: PPO

## 2017-07-22 DIAGNOSIS — R739 Hyperglycemia, unspecified: Secondary | ICD-10-CM | POA: Diagnosis not present

## 2017-07-22 DIAGNOSIS — I10 Essential (primary) hypertension: Secondary | ICD-10-CM | POA: Diagnosis not present

## 2017-07-22 DIAGNOSIS — E7800 Pure hypercholesterolemia, unspecified: Secondary | ICD-10-CM

## 2017-07-22 LAB — LIPID PANEL
CHOL/HDL RATIO: 3
Cholesterol: 236 mg/dL — ABNORMAL HIGH (ref 0–200)
HDL: 83.8 mg/dL (ref >39.00)
LDL CALC: 137 mg/dL — AB (ref 0–99)
NONHDL: 152.04
Triglycerides: 77 mg/dL (ref 0.0–149.0)
VLDL: 15.4 mg/dL (ref 0.0–40.0)

## 2017-07-22 LAB — HEPATIC FUNCTION PANEL
ALT: 13 U/L (ref 0–35)
AST: 15 U/L (ref 0–37)
Albumin: 3.8 g/dL (ref 3.5–5.2)
Alkaline Phosphatase: 47 U/L (ref 39–117)
Bilirubin, Direct: 0.1 mg/dL (ref 0.0–0.3)
Total Bilirubin: 0.6 mg/dL (ref 0.2–1.2)
Total Protein: 6.3 g/dL (ref 6.0–8.3)

## 2017-07-22 LAB — BASIC METABOLIC PANEL
BUN: 16 mg/dL (ref 6–23)
CALCIUM: 9.5 mg/dL (ref 8.4–10.5)
CHLORIDE: 104 meq/L (ref 96–112)
CO2: 30 meq/L (ref 19–32)
CREATININE: 0.88 mg/dL (ref 0.40–1.20)
GFR: 64.86 mL/min (ref >60.00)
GLUCOSE: 91 mg/dL (ref 70–99)
Potassium: 4.9 mEq/L (ref 3.5–5.1)
Sodium: 139 mEq/L (ref 135–145)

## 2017-07-22 LAB — HEMOGLOBIN A1C: Hgb A1c MFr Bld: 5.6 % (ref 4.6–6.5)

## 2017-07-26 ENCOUNTER — Encounter: Payer: Self-pay | Admitting: Internal Medicine

## 2017-07-26 ENCOUNTER — Ambulatory Visit (INDEPENDENT_AMBULATORY_CARE_PROVIDER_SITE_OTHER): Payer: PPO | Admitting: Internal Medicine

## 2017-07-26 DIAGNOSIS — F439 Reaction to severe stress, unspecified: Secondary | ICD-10-CM | POA: Diagnosis not present

## 2017-07-26 DIAGNOSIS — R21 Rash and other nonspecific skin eruption: Secondary | ICD-10-CM

## 2017-07-26 DIAGNOSIS — I10 Essential (primary) hypertension: Secondary | ICD-10-CM | POA: Diagnosis not present

## 2017-07-26 DIAGNOSIS — E7800 Pure hypercholesterolemia, unspecified: Secondary | ICD-10-CM | POA: Diagnosis not present

## 2017-07-26 DIAGNOSIS — R739 Hyperglycemia, unspecified: Secondary | ICD-10-CM

## 2017-07-26 DIAGNOSIS — Z85820 Personal history of malignant melanoma of skin: Secondary | ICD-10-CM | POA: Diagnosis not present

## 2017-07-26 MED ORDER — FELODIPINE ER 2.5 MG PO TB24: 2.5000 mg | ORAL_TABLET | Freq: Every day | ORAL | 4 refills | Status: DC

## 2017-07-26 MED ORDER — ROSUVASTATIN CALCIUM 5 MG PO TABS: 5.0000 mg | ORAL_TABLET | Freq: Every day | ORAL | 2 refills | Status: DC

## 2017-07-26 MED ORDER — FUROSEMIDE 20 MG PO TABS: 20.0000 mg | ORAL_TABLET | Freq: Every day | ORAL | 11 refills | Status: DC

## 2017-07-26 MED ORDER — BENAZEPRIL HCL 40 MG PO TABS: ORAL_TABLET | ORAL | 11 refills | Status: DC

## 2017-07-26 NOTE — Progress Notes (Signed)
Patient ID: Erin [name], female   DOB: 09/16/1930, 84 y.o.   MRN: [id number]   Subjective:    Patient ID: Erin [name], female    DOB: [birth year], 84 y.o.   MRN: [id number]  HPI  Patient here for a scheduled follow up.  Has been evaluated by dermatology for rash lower extremities.  Had biopsy.  Unrevealing.  Treated with prednisone taper.  Did not help.  Persistent.  Discussed f/u with dermatology.  Is walking.  No chest pain.  Breathing stable.  No increased sob.  Just evaluated by cardiology. Test ok.  Overall feels good.  No acid reflux.  No abdominal pain.  Bowels moving.  Handling stress well.  Discussed recent labs.  Discussed cholesterol risk.  Discussed starting a cholesterol medication.  She is in agreement.  Has tried statin medication previously.     Past Medical History:  Diagnosis Date  . Chest pain 07/13/2016  . History of colon polyps   . Hypercholesterolemia   . Hypertension    Past Surgical History:  Procedure Laterality Date  . APPENDECTOMY  1946  . CATARACT EXTRACTION Bilateral   . DILATION AND CURETTAGE OF UTERUS    . LAMINECTOMY  2000  . release of foraminal stenosis  2003   Family History  Problem Relation Age of Onset  . Breast cancer Maternal Aunt   . Hypertension Mother   . Hypertension Sister   . Heart disease Brother        S/P CABG  . Multiple sclerosis Sister        died 12   Social History   Socioeconomic History  . Marital status: Widowed    Spouse name: Not on file  . Number of children: 4  . Years of education: Not on file  . Highest education level: Not on file  Occupational History  . Occupation: retired  Scientific laboratory technician  . Financial resource strain: Not on file  . Food insecurity:    Worry: Not on file    Inability: Not on file  . Transportation needs:    Medical: Not on file    Non-medical: Not on file  Tobacco Use  . Smoking status: Former Research scientist (life sciences)  . Smokeless tobacco: Never Used  Substance and Sexual Activity  . Alcohol use:  Yes    Alcohol/week: 0.0 oz    Comment: rare  . Drug use: No  . Sexual activity: Never  Lifestyle  . Physical activity:    Days per week: Not on file    Minutes per session: Not on file  . Stress: Not on file  Relationships  . Social connections:    Talks on phone: Not on file    Gets together: Not on file    Attends religious service: Not on file    Active member of club or organization: Not on file    Attends meetings of clubs or organizations: Not on file    Relationship status: Not on file  Other Topics Concern  . Not on file  Social History Narrative  . Not on file    Outpatient Encounter Medications as of 07/26/2017  Medication Sig  . aspirin 81 MG tablet Take 81 mg by mouth daily.  . benazepril (LOTENSIN) 40 MG tablet Take 1 tablet (40 mg total) by mouth 2 (two) times daily.  . Calcium Carbonate-Vitamin D (CALCIUM 600/VITAMIN D) 600-400 MG-UNIT per tablet Take 1 tablet by mouth daily.  . felodipine (PLENDIL) 2.5 MG 24 hr tablet Take 1  tablet (2.5 mg total) by mouth daily.  . furosemide (LASIX) 20 MG tablet Take 1 tablet (20 mg total) by mouth daily.  . metoprolol succinate (TOPROL XL) 25 MG 24 hr tablet Take 0.5 tablets (12.5 mg total) by mouth daily.  . Multiple Vitamin (MULTIVITAMIN) tablet Take 1 tablet by mouth daily.  . Omega-3 Fatty Acids (FISH OIL) 1000 MG CAPS Take by mouth daily.  . Red Yeast Rice Extract (RED YEAST RICE PO) Take 2 tablets by mouth.  . rosuvastatin (CRESTOR) 5 MG tablet Take 1 tablet (5 mg total) by mouth daily.  . [DISCONTINUED] benazepril (LOTENSIN) 40 MG tablet Take 1 tablet (40 mg total) by mouth 2 (two) times daily.  . [DISCONTINUED] felodipine (PLENDIL) 2.5 MG 24 hr tablet Take 1 tablet (2.5 mg total) by mouth daily.  . [DISCONTINUED] furosemide (LASIX) 20 MG tablet Take 1 tablet (20 mg total) by mouth daily. (Patient taking differently: Take 20 mg by mouth daily as needed. )   No facility-administered encounter medications on file as of  07/26/2017.     Review of Systems  Constitutional: Negative for appetite change and unexpected weight change.  HENT: Negative for congestion and sinus pressure.   Respiratory: Negative for cough, chest tightness and shortness of breath.   Cardiovascular: Negative for chest pain, palpitations and leg swelling.  Gastrointestinal: Negative for abdominal pain, diarrhea, nausea and vomiting.  Genitourinary: Negative for difficulty urinating and dysuria.  Musculoskeletal: Negative for joint swelling and myalgias.  Skin: Positive for rash. Negative for color change.       Lower extremities.   Neurological: Negative for dizziness, light-headedness and headaches.  Psychiatric/Behavioral: Negative for agitation and dysphoric mood.       Objective:    Physical Exam  Constitutional: She appears well-developed and well-nourished. No distress.  HENT:  Nose: Nose normal.  Mouth/Throat: Oropharynx is clear and moist.  Neck: Neck supple. No thyromegaly present.  Cardiovascular: Normal rate and regular rhythm.  Pulmonary/Chest: Breath sounds normal. No respiratory distress. She has no wheezes.  Abdominal: Soft. Bowel sounds are normal. There is no tenderness.  Musculoskeletal: She exhibits no edema or tenderness.  Lymphadenopathy:    She has no cervical adenopathy.  Skin: Rash noted. No erythema.  Psychiatric: She has a normal mood and affect. Her behavior is normal.    BP 136/70 (BP Location: Left Arm, Patient Position: Sitting, Cuff Size: Normal)   Pulse (!) 59   Temp 97.9 F (36.6 C) (Oral)   Resp 18   Wt 126 lb 12.8 oz (57.5 kg)   SpO2 96%   BMI 24.76 kg/m  Wt Readings from Last 3 Encounters:  07/26/17 126 lb 12.8 oz (57.5 kg)  06/16/17 125 lb 8 oz (56.9 kg)  04/23/17 128 lb 12.8 oz (58.4 kg)     Lab Results  Component Value Date   WBC 4.9 11/20/2016   HGB 14.4 11/20/2016   HCT 42.3 11/20/2016   PLT 246.0 11/20/2016   GLUCOSE 91 07/22/2017   CHOL 236 (H) 07/22/2017   TRIG  77.0 07/22/2017   HDL 83.80 07/22/2017   LDLCALC 137 (H) 07/22/2017   ALT 13 07/22/2017   AST 15 07/22/2017   NA 139 07/22/2017   K 4.9 07/22/2017   CL 104 07/22/2017   CREATININE 0.88 07/22/2017   BUN 16 07/22/2017   CO2 30 07/22/2017   TSH 1.12 11/20/2016   HGBA1C 5.6 07/22/2017    Nm Myocar Multi W/spect W/wall Motion / Ef  Result Date: 07/13/2016  Blood pressure demonstrated a normal response to exercise.  There was no ST segment deviation noted during stress.  No T wave inversion was noted during stress.  The study is normal.  This is a low risk study.  The left ventricular ejection fraction is hyperdynamic (>65%).        Assessment & Plan:   Problem List Items Addressed This Visit    History of melanoma    Followed by dermatology.        Hypercholesterolemia    Discussed cholesterol results and calculated cholesterol risk.  She agreed to start crestor.  Start low dose and confirm tolerance.  Check liver panel in 6 weeks.        Relevant Medications   furosemide (LASIX) 20 MG tablet   felodipine (PLENDIL) 2.5 MG 24 hr tablet   rosuvastatin (CRESTOR) 5 MG tablet   benazepril (LOTENSIN) 40 MG tablet   Other Relevant Orders   Hepatic function panel   Hyperglycemia    Low carb diet and exercise.  Follow met b and a1c.        Hypertension    Blood pressure under good control.  Continue same medication regimen.  Follow pressures.  Follow metabolic panel.        Relevant Medications   furosemide (LASIX) 20 MG tablet   felodipine (PLENDIL) 2.5 MG 24 hr tablet   rosuvastatin (CRESTOR) 5 MG tablet   benazepril (LOTENSIN) 40 MG tablet   Stress    Doing well.  Follow.         Other Visit Diagnoses    Rash       Persistent.  F/u with Dr Kellie Moor.  Did not respone to prednisone.         Einar Pheasant, MD

## 2017-07-27 ENCOUNTER — Encounter: Payer: Self-pay | Admitting: Internal Medicine

## 2017-07-27 NOTE — Assessment & Plan Note (Signed)
Doing well.  Follow.  

## 2017-07-27 NOTE — Assessment & Plan Note (Signed)
Low carb diet and exercise.  Follow met b and a1c.   

## 2017-07-27 NOTE — Assessment & Plan Note (Signed)
Followed by dermatology

## 2017-07-27 NOTE — Assessment & Plan Note (Signed)
Discussed cholesterol results and calculated cholesterol risk.  She agreed to start crestor.  Start low dose and confirm tolerance.  Check liver panel in 6 weeks.

## 2017-07-27 NOTE — Assessment & Plan Note (Signed)
Blood pressure under good control.  Continue same medication regimen.  Follow pressures.  Follow metabolic panel.   

## 2017-07-30 DIAGNOSIS — D485 Neoplasm of uncertain behavior of skin: Secondary | ICD-10-CM | POA: Diagnosis not present

## 2017-07-30 DIAGNOSIS — L565 Disseminated superficial actinic porokeratosis (DSAP): Secondary | ICD-10-CM | POA: Diagnosis not present

## 2017-07-30 DIAGNOSIS — Q828 Other specified congenital malformations of skin: Secondary | ICD-10-CM | POA: Diagnosis not present

## 2017-09-08 ENCOUNTER — Other Ambulatory Visit (INDEPENDENT_AMBULATORY_CARE_PROVIDER_SITE_OTHER): Payer: PPO

## 2017-09-08 DIAGNOSIS — E7800 Pure hypercholesterolemia, unspecified: Secondary | ICD-10-CM

## 2017-09-08 LAB — HEPATIC FUNCTION PANEL
ALK PHOS: 50 U/L (ref 39–117)
ALT: 14 U/L (ref 0–35)
AST: 18 U/L (ref 0–37)
Albumin: 4.1 g/dL (ref 3.5–5.2)
BILIRUBIN TOTAL: 0.6 mg/dL (ref 0.2–1.2)
Bilirubin, Direct: 0.2 mg/dL (ref 0.0–0.3)
Total Protein: 6.5 g/dL (ref 6.0–8.3)

## 2017-09-09 ENCOUNTER — Other Ambulatory Visit: Payer: Self-pay | Admitting: Internal Medicine

## 2017-09-09 DIAGNOSIS — I10 Essential (primary) hypertension: Secondary | ICD-10-CM

## 2017-09-09 DIAGNOSIS — R739 Hyperglycemia, unspecified: Secondary | ICD-10-CM

## 2017-09-09 DIAGNOSIS — E7800 Pure hypercholesterolemia, unspecified: Secondary | ICD-10-CM

## 2017-09-09 NOTE — Progress Notes (Signed)
Orders placed for f/u labs.  

## 2017-10-25 ENCOUNTER — Other Ambulatory Visit: Payer: Self-pay | Admitting: Internal Medicine

## 2017-11-10 IMAGING — CT CT ANGIO CHEST-ABD-PELV FOR DISSECTION W/ AND WO/W CM
2 of 7 series · 13 of 46 positions shown, 15 images · IV contrast (APPLIED)
Comparison: Chest radiographs 2294 hours today. Chest CT 05/07/2006

CLINICAL DATA: 86-year-old female with midsternal chest pain for 2
days.

EXAM:
CT ANGIOGRAPHY CHEST, ABDOMEN AND PELVIS
TECHNIQUE: Multidetector CT imaging through the chest, abdomen and pelvis was
performed using the standard protocol during bolus administration of
intravenous contrast. Multiplanar reconstructed images and MIPs were
obtained and reviewed to evaluate the vascular anatomy.
CONTRAST:  75 mL Isovue 370

[Series 5: axial arterial · axial · arterial · 0.83mm/px · z∈[-539,-23]mm · 10 of 194 slices shown, 12 images]
[im 11/194  soft-tissue]
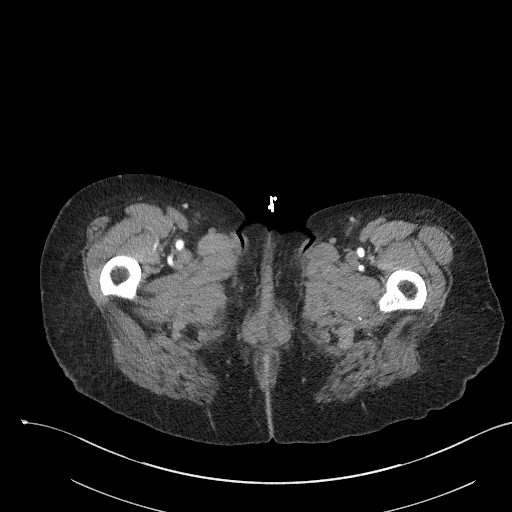
[im 11/194  bone]
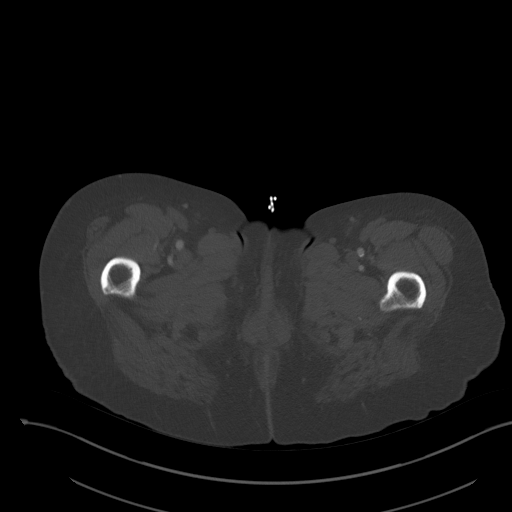
[im 31/194  soft-tissue]
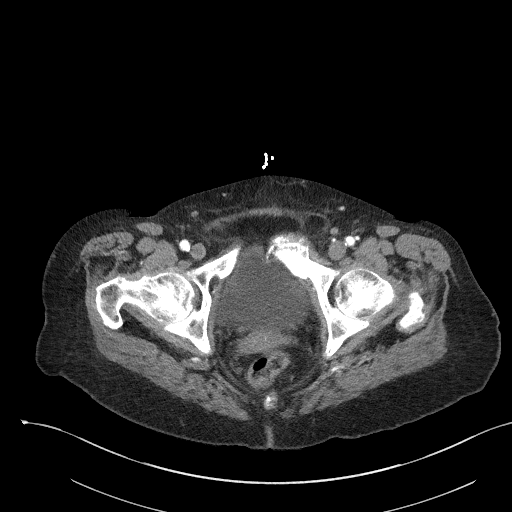
[im 51/194  soft-tissue]
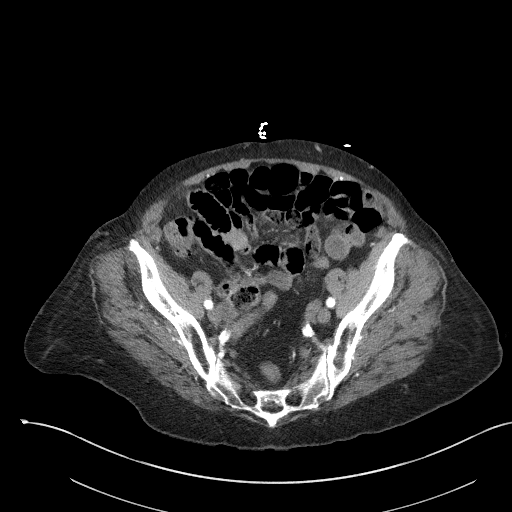
[im 72/194  soft-tissue]
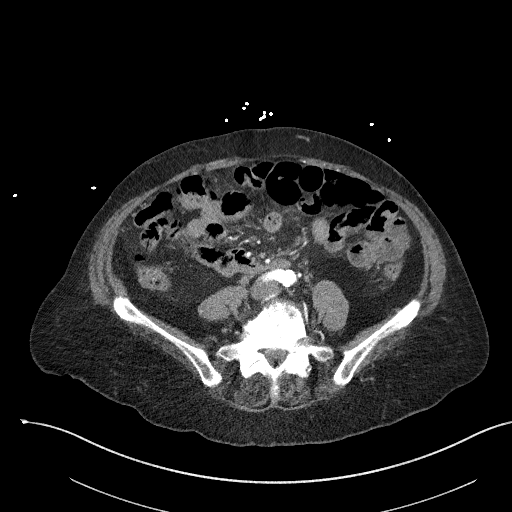
[im 92/194  soft-tissue]
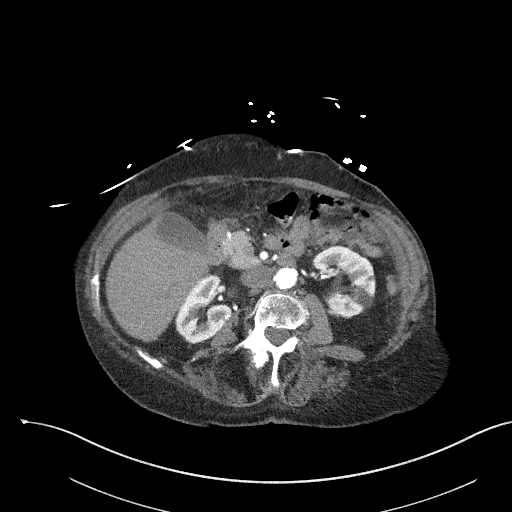
[im 102/194  soft-tissue]
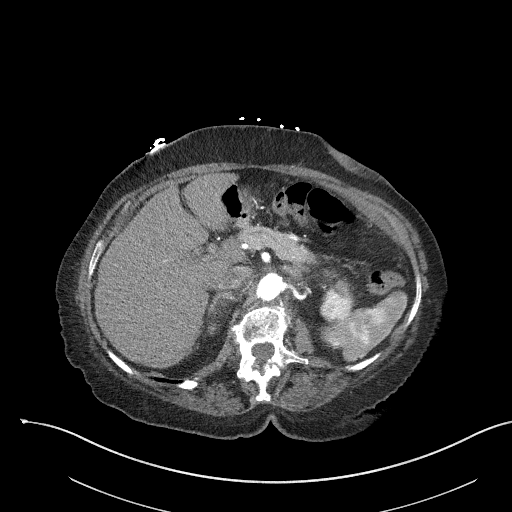
[im 122/194  soft-tissue]
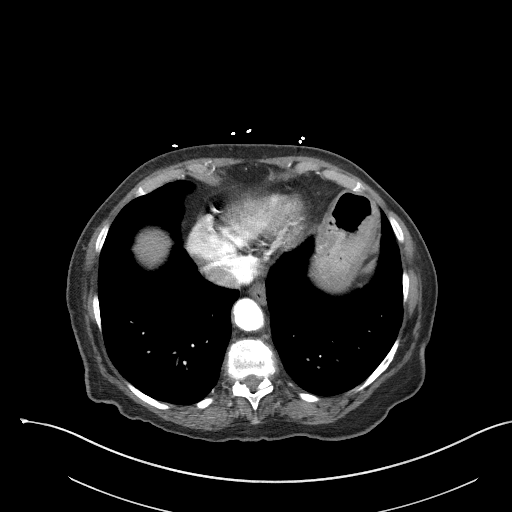
[im 143/194  soft-tissue]
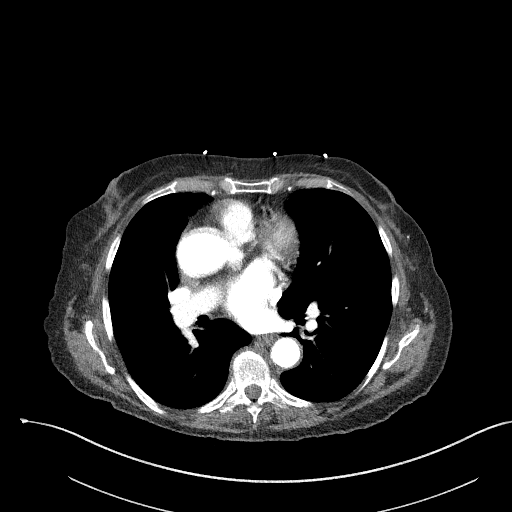
[im 163/194  soft-tissue]
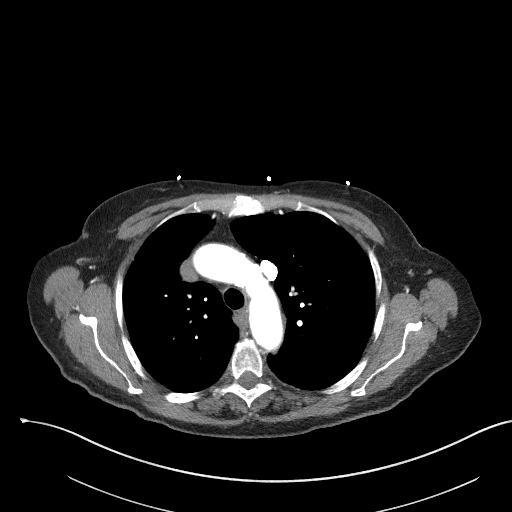
[im 163/194  bone]
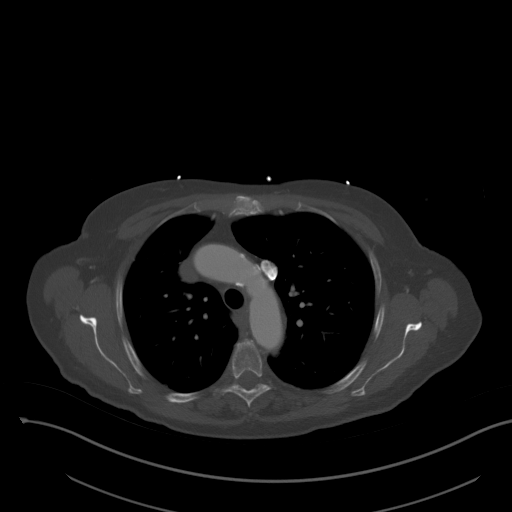
[im 183/194  soft-tissue]
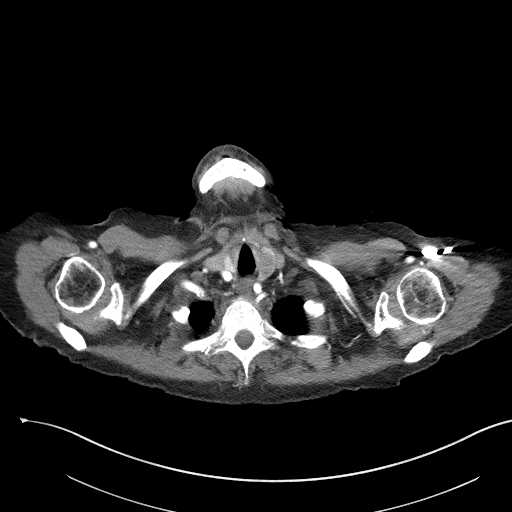

[Series 8: coronals · coronal · 0.81mm/px · 3 of 125 slices shown]
[im 32/125  soft-tissue]
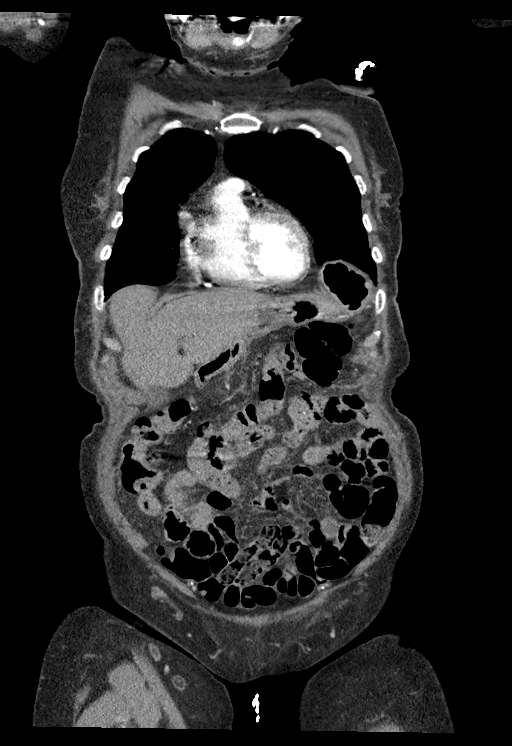
[im 63/125  soft-tissue]
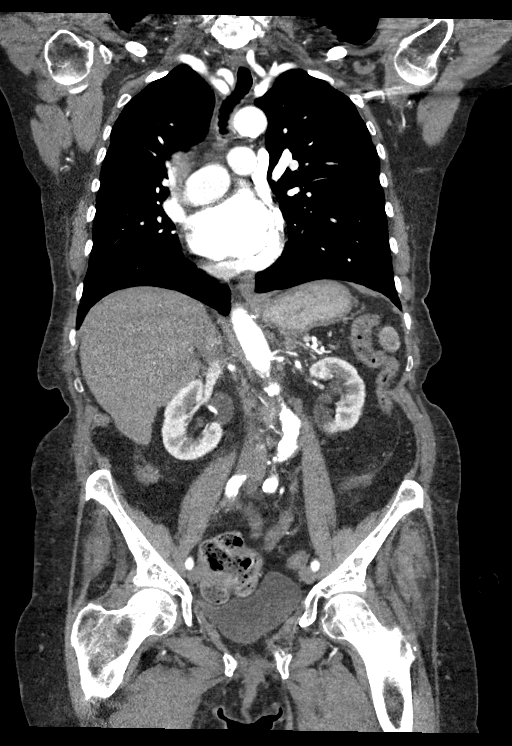
[im 94/125  soft-tissue]
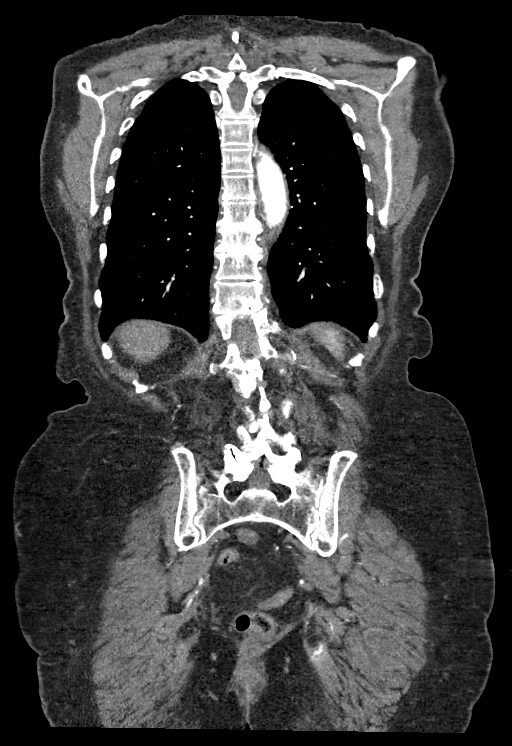

[13 of 46 positions shown; findings below may reference images not displayed]

FINDINGS: CTA CHEST FINDINGS

CARDIOVASCULAR

Persistent left side SVC (normal variant).

Calcified coronary artery atherosclerosis.

Chronic tortuosity of the thoracic aorta and proximal great vessels.
38-39 mm diameter ascending aorta. The arch and descending aortic
caliber are within normal limits. Mild calcified plaque in the arch.
Mild to moderate soft and calcified plaque in the descending
thoracic aorta. No thoracic aorta dissection. Negative visualized
proximal great vessels aside from tortuosity.

The central pulmonary arteries are also opacified and appear normal.
No pericardial effusion.

Mediastinum/Nodes: No mediastinal or hilar lymphadenopathy.

19 mm hypodense left thyroid nodule noted but was present, albeit
smaller, in 9221 and significance is doubtful.

No axillary lymphadenopathy.

Lungs/Pleura: Major airways are patent. Improved lung volumes
compared to 9221. Both lungs are clear. No pleural effusion.

Musculoskeletal: Chronic lower cervical, upper thoracic, and lower
thoracic disc and endplate degeneration with progression since 9221.
Intermittent vacuum disc, endplate sclerosis, and endplate spurring.
No thoracic compression fracture identified. Chronic fracture of the
left lateral third rib is unchanged since 9221. No acute osseous
abnormality identified.

Review of the MIP images confirms the above findings.

CTA ABDOMEN AND PELVIS FINDINGS

VASCULAR

Aortoiliac calcified atherosclerosis. No dissection. No abdominal
aortic aneurysm.

Femoral artery soft and calcified plaque greater on the left the
major branches of the abdominal aorta are relatively spared of
atherosclerosis. Major arterial structures in the abdomen and pelvis
are patent.

Review of the MIP images confirms the above findings.

NON-VASCULAR

Hepatobiliary: Negative.

Pancreas: Negative.

Spleen: Negative.

Adrenals/Urinary Tract: Chronic adrenal gland thickening compatible
with adrenal hyperplasia. Bilateral renal enhancement is within
normal limits, with a small midpole benign-appearing cortical cyst
on the left. Right extrarenal pelvis, and left posteriorly directed
dilated renal pelvis which then rapidly tapers to the left ureter.
Both ureters appear within normal limits. Unremarkable urinary
bladder.

No abdominal free air or free fluid.

Stomach/Bowel: Redundant sigmoid colon. Decompressed rectosigmoid.
Decompressed left colon. Intermittent gas-filled transverse colon.
Redundant flexures. The cecum is located in the right hemipelvis and
mildly distended with stool. Otherwise negative right colon. No
pericecal inflammation. The appendix is diminutive or absent.
Negative terminal ileum. No dilated small bowel. Mild distention of
the stomach. Duodenum is within normal limits.

Lymphatic: No lymphadenopathy.

Reproductive: Negative.

Other: No pelvic free fluid.

Musculoskeletal: Levoconvex lumbar scoliosis with widespread
advanced disc and endplate degeneration. Mild to moderate upper
lumbar spondylolisthesis. Moderate to severe widespread lumbar facet
degeneration. Chronic left pubic symphysis and left pubic ramus
fractures. No acute osseous abnormality identified.

Review of the MIP images confirms the above findings.
IMPRESSION: 1. Negative for aortic dissection. Ectatic, tortuous thoracic aorta
with borderline aneurysmal ascending aorta measuring 38-39 mm
diameter. Other aortic segments are normal aside from calcified
atherosclerosis.
2. Calcified coronary artery atherosclerosis. Calcified iliofemoral
atherosclerosis.
3. Patent central pulmonary arteries.
4. Normal lung parenchyma. No acute or inflammatory process
identified in the abdomen or pelvis.
5. Spinal scoliosis, spondylolisthesis, and degeneration.

## 2017-11-10 IMAGING — CR DG CHEST 2V
1 series · 2 of 2 positions shown · non-contrast
Comparison: 07/29/2015

CLINICAL DATA: Chest pain

EXAM:
CHEST  2 VIEW

[Series 1: dg chest 2 view · 0.14mm/px · 2 of 2 slices shown]
[im 1/2]
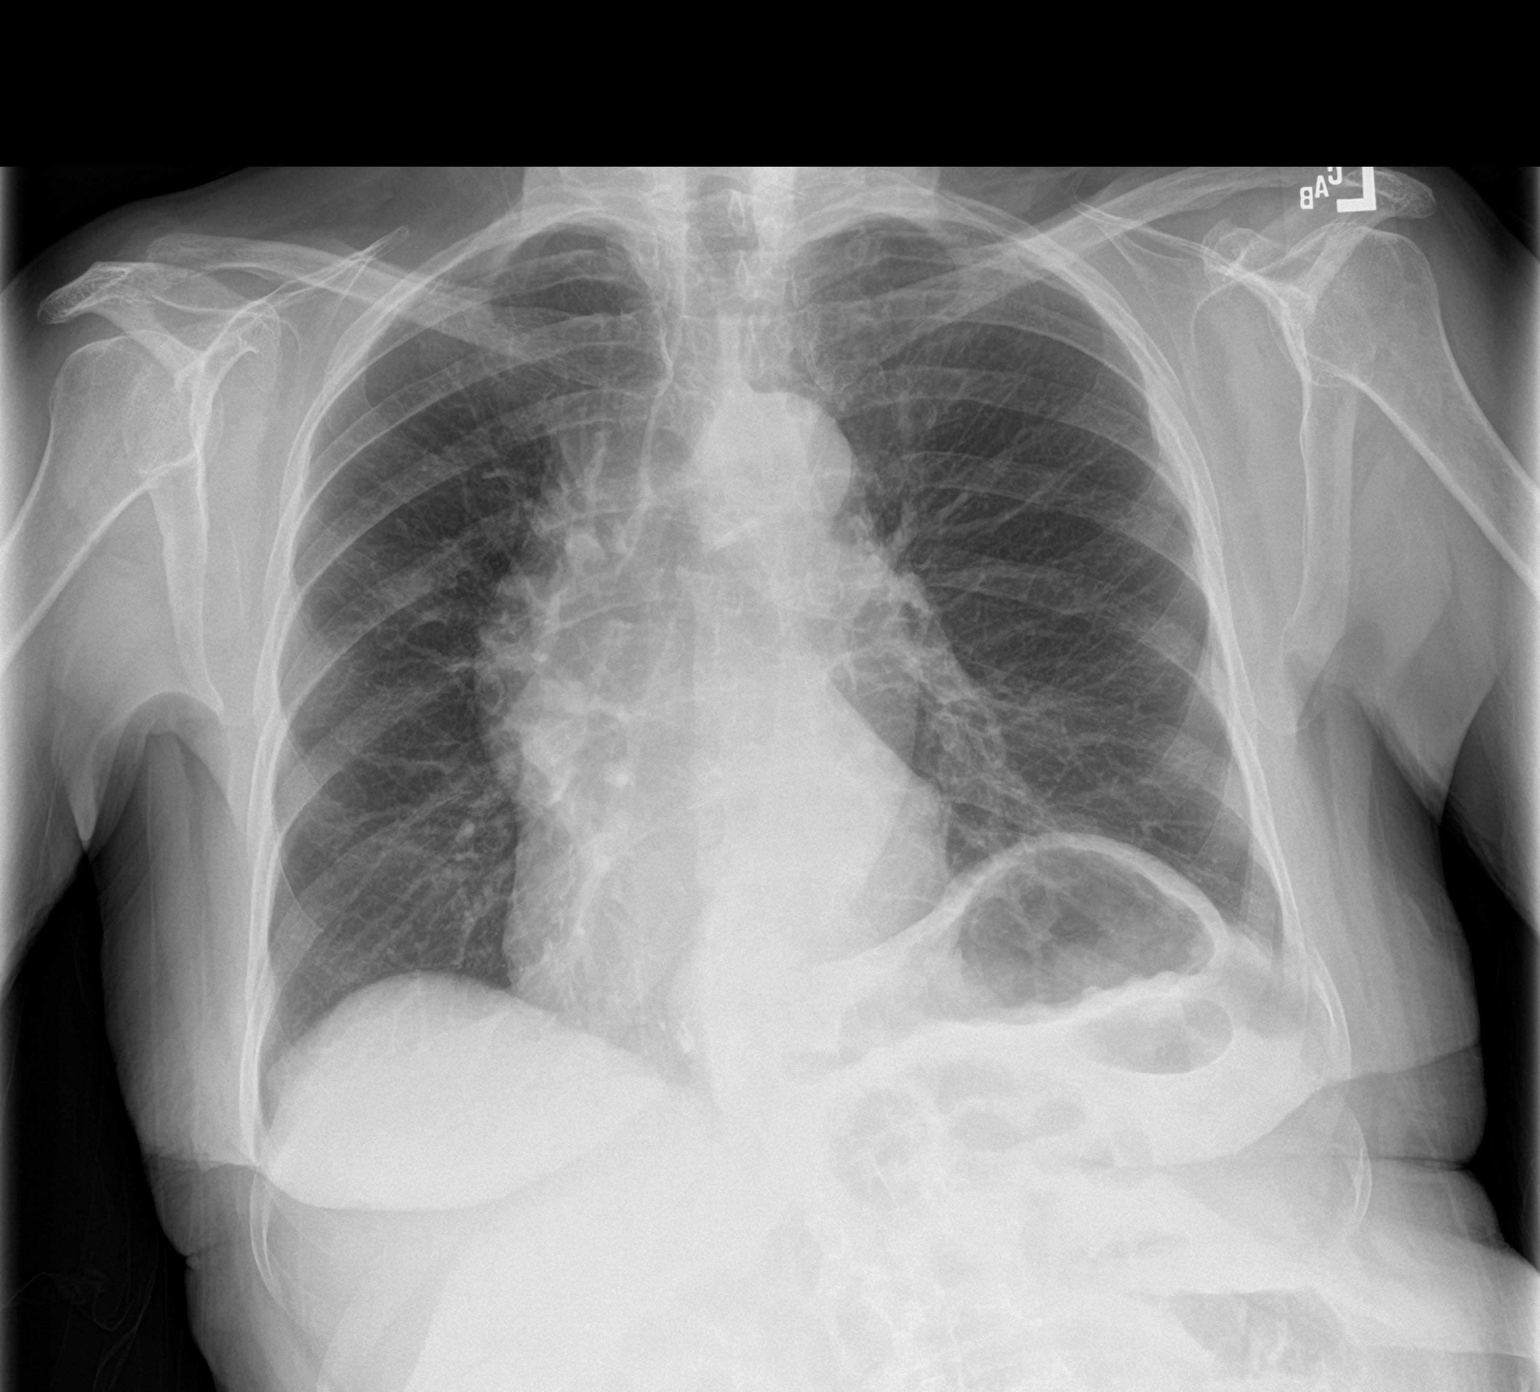
[im 2/2]
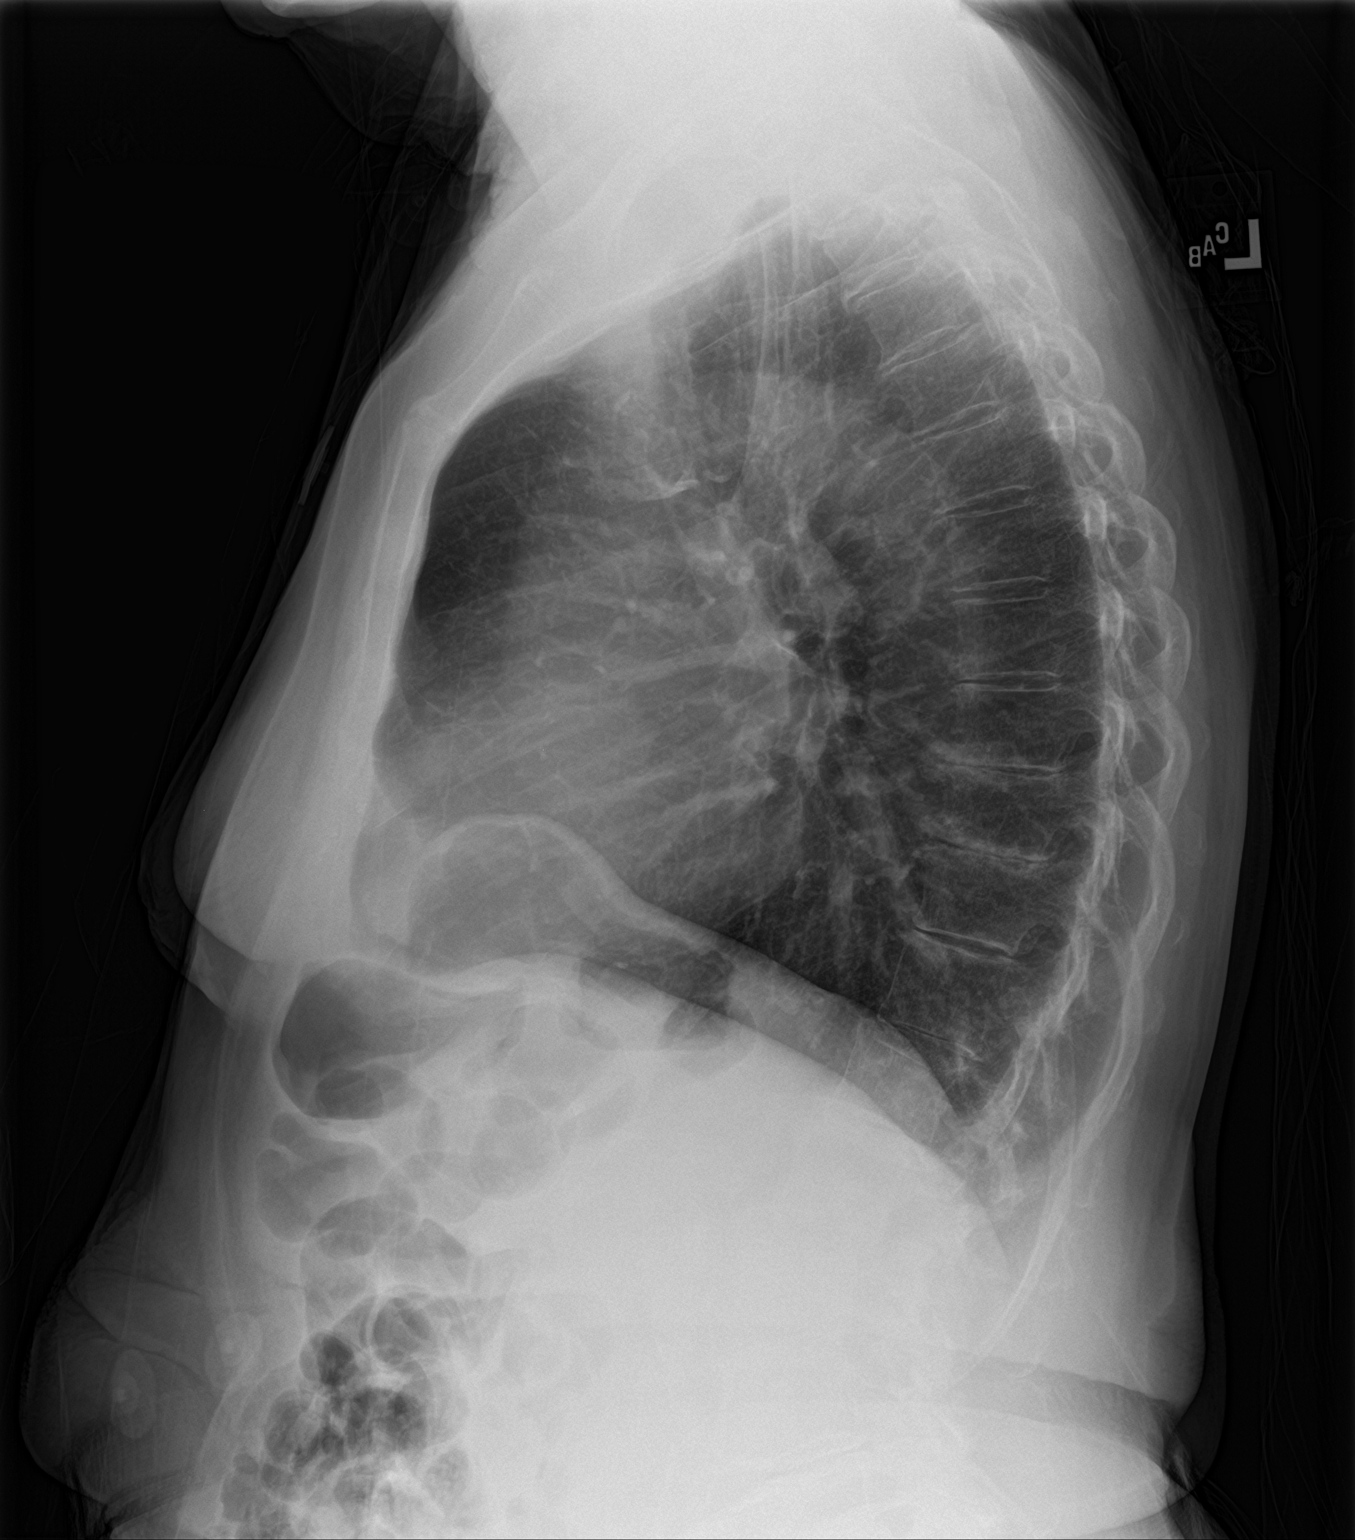

[2 of 2 positions shown; findings below may reference images not displayed]

FINDINGS: Hyperinflation. The lungs are clear wiithout focal pneumonia, edema,
pneumothorax or pleural effusion. Cardiopericardial silhouette is at
upper limits of normal for size. Prominent right mediastinum, likely
related to ascending aorta, as seen on multiple prior studies back
to 05/07/2006. Ascending aorta measured 3.9 cm maximum diameter on
the CT scan from 05/07/2006. The visualized bony structures of the
thorax are intact.
IMPRESSION: 1. Hyperinflation without acute findings.
2. Probable ascending thoracic aortic aneurysm. CT chest could be
used to further evaluate, as clinically warranted.

## 2017-11-29 ENCOUNTER — Other Ambulatory Visit: Payer: Self-pay | Admitting: Internal Medicine

## 2017-11-29 NOTE — Telephone Encounter (Signed)
*  STAT* If patient is at the pharmacy, call can be transferred to refill team.   1. Which medications need to be refilled? (please list name of each medication and dose if known) metoprolol   2. Which pharmacy/location (including street and city if local pharmacy) is medication to be sent to? [facility]  3. Do they need a 30 day or 90 day supply? [place]

## 2017-11-30 ENCOUNTER — Other Ambulatory Visit (INDEPENDENT_AMBULATORY_CARE_PROVIDER_SITE_OTHER): Payer: PPO

## 2017-11-30 DIAGNOSIS — R739 Hyperglycemia, unspecified: Secondary | ICD-10-CM

## 2017-11-30 DIAGNOSIS — I10 Essential (primary) hypertension: Secondary | ICD-10-CM

## 2017-11-30 DIAGNOSIS — E7800 Pure hypercholesterolemia, unspecified: Secondary | ICD-10-CM | POA: Diagnosis not present

## 2017-11-30 LAB — BASIC METABOLIC PANEL
BUN: 14 mg/dL (ref 6–23)
CALCIUM: 10.2 mg/dL (ref 8.4–10.5)
CO2: 27 mEq/L (ref 19–32)
Chloride: 101 mEq/L (ref 96–112)
Creatinine, Ser: 0.86 mg/dL (ref 0.40–1.20)
GFR: 66.54 mL/min (ref >60.00)
GLUCOSE: 88 mg/dL (ref 70–99)
POTASSIUM: 4.1 meq/L (ref 3.5–5.1)
Sodium: 139 mEq/L (ref 135–145)

## 2017-11-30 LAB — HEPATIC FUNCTION PANEL
ALBUMIN: 4.2 g/dL (ref 3.5–5.2)
ALK PHOS: 54 U/L (ref 39–117)
ALT: 13 U/L (ref 0–35)
AST: 18 U/L (ref 0–37)
BILIRUBIN DIRECT: 0.1 mg/dL (ref 0.0–0.3)
BILIRUBIN TOTAL: 0.7 mg/dL (ref 0.2–1.2)
Total Protein: 6.7 g/dL (ref 6.0–8.3)

## 2017-11-30 LAB — CBC WITH DIFFERENTIAL/PLATELET
BASOS ABS: 0 10*3/uL (ref 0.0–0.1)
BASOS PCT: 0.8 % (ref 0.0–3.0)
EOS PCT: 2.5 % (ref 0.0–5.0)
Eosinophils Absolute: 0.1 10*3/uL (ref 0.0–0.7)
HCT: 39.5 % (ref 36.0–46.0)
Hemoglobin: 13.5 g/dL (ref 12.0–15.0)
LYMPHS ABS: 1 10*3/uL (ref 0.7–4.0)
LYMPHS PCT: 26.2 % (ref 12.0–46.0)
MCHC: 34.2 g/dL (ref 30.0–36.0)
MCV: 96.3 fl (ref 78.0–100.0)
MONOS PCT: 11.7 % (ref 3.0–12.0)
Monocytes Absolute: 0.4 10*3/uL (ref 0.1–1.0)
NEUTROS ABS: 2.2 10*3/uL (ref 1.4–7.7)
NEUTROS PCT: 58.8 % (ref 43.0–77.0)
PLATELETS: 187 10*3/uL (ref 150.0–400.0)
RBC: 4.1 Mil/uL (ref 3.87–5.11)
RDW: 13.3 % (ref 11.5–15.5)
WBC: 3.8 10*3/uL — ABNORMAL LOW (ref 4.0–10.5)

## 2017-11-30 LAB — HEMOGLOBIN A1C: Hgb A1c MFr Bld: 5.7 % (ref 4.6–6.5)

## 2017-11-30 LAB — LIPID PANEL
CHOL/HDL RATIO: 2
Cholesterol: 165 mg/dL (ref 0–200)
HDL: 85 mg/dL (ref >39.00)
LDL Cholesterol: 64 mg/dL (ref 0–99)
NONHDL: 79.61
Triglycerides: 78 mg/dL (ref 0.0–149.0)
VLDL: 15.6 mg/dL (ref 0.0–40.0)

## 2017-11-30 LAB — TSH: TSH: 1.46 u[IU]/mL (ref 0.35–4.50)

## 2017-12-02 ENCOUNTER — Encounter: Payer: Self-pay | Admitting: Internal Medicine

## 2017-12-02 ENCOUNTER — Ambulatory Visit (INDEPENDENT_AMBULATORY_CARE_PROVIDER_SITE_OTHER): Payer: PPO | Admitting: Internal Medicine

## 2017-12-02 VITALS — BP 106/62 | HR 62 | Temp 97.5°F | Resp 18 | Wt 127.0 lb

## 2017-12-02 DIAGNOSIS — D72819 Decreased white blood cell count, unspecified: Secondary | ICD-10-CM | POA: Diagnosis not present

## 2017-12-02 DIAGNOSIS — Z23 Encounter for immunization: Secondary | ICD-10-CM | POA: Diagnosis not present

## 2017-12-02 DIAGNOSIS — Z85820 Personal history of malignant melanoma of skin: Secondary | ICD-10-CM

## 2017-12-02 DIAGNOSIS — I10 Essential (primary) hypertension: Secondary | ICD-10-CM | POA: Diagnosis not present

## 2017-12-02 DIAGNOSIS — R739 Hyperglycemia, unspecified: Secondary | ICD-10-CM

## 2017-12-02 DIAGNOSIS — E7800 Pure hypercholesterolemia, unspecified: Secondary | ICD-10-CM

## 2017-12-02 DIAGNOSIS — F439 Reaction to severe stress, unspecified: Secondary | ICD-10-CM | POA: Diagnosis not present

## 2017-12-02 DIAGNOSIS — I471 Supraventricular tachycardia: Secondary | ICD-10-CM

## 2017-12-02 NOTE — Assessment & Plan Note (Signed)
Started crestor last visit.  Recent cholesterol levels improved.  Will hold on increasing dose.  Low cholesterol diet and exercise.  Follow lipid panel and liver function tests.

## 2017-12-02 NOTE — Progress Notes (Signed)
Patient ID: CLARIS PECH, female   DOB: [birth year], 84 y.o.   MRN: [id number]   Subjective:    Patient ID: SAMARRAH TRANCHINA, female    DOB: 1930/10/21, 84 y.o.   MRN: [id number]  HPI  Patient here for a scheduled follow up.  She is doing well.  Feels good.  Stays active.  No chest pain.  Breathing stable.  No acid reflux.  No abdominal pain.  Bowels moving.  No urine change.  States blood pressures have been running mostly 110-120/60s.  Has had some readings in the 90s.  Discussed adjusting her blood pressure medication and stopping plendil.  She will follow pressures.  Handling stress.  Discussed labs. Continues to decline mammogram.     Past Medical History:  Diagnosis Date  . Chest pain 07/13/2016  . History of colon polyps   . Hypercholesterolemia   . Hypertension    Past Surgical History:  Procedure Laterality Date  . APPENDECTOMY  1946  . CATARACT EXTRACTION Bilateral   . DILATION AND CURETTAGE OF UTERUS    . LAMINECTOMY  2000  . release of foraminal stenosis  2003   Family History  Problem Relation Age of Onset  . Breast cancer Maternal Aunt   . Hypertension Mother   . Hypertension Sister   . Heart disease Brother        S/P CABG  . Multiple sclerosis Sister        died 92   Social History   Socioeconomic History  . Marital status: Widowed    Spouse name: Not on file  . Number of children: 4  . Years of education: Not on file  . Highest education level: Not on file  Occupational History  . Occupation: retired  Scientific laboratory technician  . Financial resource strain: Not on file  . Food insecurity:    Worry: Not on file    Inability: Not on file  . Transportation needs:    Medical: Not on file    Non-medical: Not on file  Tobacco Use  . Smoking status: Former Research scientist (life sciences)  . Smokeless tobacco: Never Used  Substance and Sexual Activity  . Alcohol use: Yes    Alcohol/week: 0.0 standard drinks    Comment: rare  . Drug use: No  . Sexual activity: Never  Lifestyle  . Physical  activity:    Days per week: Not on file    Minutes per session: Not on file  . Stress: Not on file  Relationships  . Social connections:    Talks on phone: Not on file    Gets together: Not on file    Attends religious service: Not on file    Active member of club or organization: Not on file    Attends meetings of clubs or organizations: Not on file    Relationship status: Not on file  Other Topics Concern  . Not on file  Social History Narrative  . Not on file    Outpatient Encounter Medications as of 12/02/2017  Medication Sig  . benazepril (LOTENSIN) 40 MG tablet Take 1 tablet (40 mg total) by mouth 2 (two) times daily.  . Calcium Carbonate-Vitamin D (CALCIUM 600/VITAMIN D) 600-400 MG-UNIT per tablet Take 1 tablet by mouth daily.  . felodipine (PLENDIL) 2.5 MG 24 hr tablet Take 1 tablet (2.5 mg total) by mouth daily.  . furosemide (LASIX) 20 MG tablet Take 1 tablet (20 mg total) by mouth daily.  . metoprolol succinate (TOPROL-XL) 25 MG 24 hr  tablet Take 0.5 tablets (12.5 mg total) by mouth daily.  . Multiple Vitamin (MULTIVITAMIN) tablet Take 1 tablet by mouth daily.  . Omega-3 Fatty Acids (FISH OIL) 1000 MG CAPS Take by mouth daily.  . rosuvastatin (CRESTOR) 5 MG tablet Take 1 tablet (5 mg total) by mouth daily.  . [DISCONTINUED] aspirin 81 MG tablet Take 81 mg by mouth daily.  . [DISCONTINUED] Red Yeast Rice Extract (RED YEAST RICE PO) Take 2 tablets by mouth.   No facility-administered encounter medications on file as of 12/02/2017.     Review of Systems  Constitutional: Negative for appetite change and unexpected weight change.  HENT: Negative for congestion and sinus pressure.   Respiratory: Negative for cough, chest tightness and shortness of breath.   Cardiovascular: Negative for chest pain, palpitations and leg swelling.  Gastrointestinal: Negative for abdominal pain, diarrhea, nausea and vomiting.  Genitourinary: Negative for difficulty urinating and dysuria.    Musculoskeletal: Negative for joint swelling and myalgias.  Skin: Negative for color change and rash.  Neurological: Negative for dizziness, light-headedness and headaches.  Psychiatric/Behavioral: Negative for agitation and dysphoric mood.       Objective:    Physical Exam  Constitutional: She appears well-developed and well-nourished. No distress.  HENT:  Nose: Nose normal.  Mouth/Throat: Oropharynx is clear and moist.  Neck: Neck supple. No thyromegaly present.  Cardiovascular: Normal rate and regular rhythm.  Pulmonary/Chest: Breath sounds normal. No respiratory distress. She has no wheezes.  Abdominal: Soft. Bowel sounds are normal. There is no tenderness.  Musculoskeletal: She exhibits no edema or tenderness.  Lymphadenopathy:    She has no cervical adenopathy.  Skin: No rash noted. No erythema.  Psychiatric: She has a normal mood and affect. Her behavior is normal.    BP 106/62 (BP Location: Left Arm, Patient Position: Sitting, Cuff Size: Normal)   Pulse 62   Temp (!) 97.5 F (36.4 C) (Oral)   Resp 18   Wt 127 lb (57.6 kg)   SpO2 97%   BMI 24.80 kg/m  Wt Readings from Last 3 Encounters:  12/02/17 127 lb (57.6 kg)  07/26/17 126 lb 12.8 oz (57.5 kg)  06/16/17 125 lb 8 oz (56.9 kg)     Lab Results  Component Value Date   WBC 3.8 (L) 11/30/2017   HGB 13.5 11/30/2017   HCT 39.5 11/30/2017   PLT 187.0 11/30/2017   GLUCOSE 88 11/30/2017   CHOL 165 11/30/2017   TRIG 78.0 11/30/2017   HDL 85.00 11/30/2017   LDLCALC 64 11/30/2017   ALT 13 11/30/2017   AST 18 11/30/2017   NA 139 11/30/2017   K 4.1 11/30/2017   CL 101 11/30/2017   CREATININE 0.86 11/30/2017   BUN 14 11/30/2017   CO2 27 11/30/2017   TSH 1.46 11/30/2017   HGBA1C 5.7 11/30/2017    Nm Myocar Multi W/spect W/wall Motion / Ef  Result Date: 07/13/2016  Blood pressure demonstrated a normal response to exercise.  There was no ST segment deviation noted during stress.  No T wave inversion was  noted during stress.  The study is normal.  This is a low risk study.  The left ventricular ejection fraction is hyperdynamic (>65%).        Assessment & Plan:   Problem List Items Addressed This Visit    History of melanoma    Followed by dermatology.       Hypercholesterolemia    Started crestor last visit.  Recent cholesterol levels improved.  Will  hold on increasing dose.  Low cholesterol diet and exercise.  Follow lipid panel and liver function tests.        Relevant Orders   Hepatic function panel   Lipid panel   Hyperglycemia    Follow met b and a1c.       Relevant Orders   Hemoglobin A1c   Hypertension    Blood pressure as outlined.  Will stop plendil.  Have her spot check her pressures.  Adjust medication as needed.        Relevant Orders   Basic metabolic panel   Leukopenia    Slightly decreased white blood cell count.  Differential wnl.  Follow cbc.       Relevant Orders   CBC with Differential/Platelet   Narrow complex tachycardia ([place])    Found on monitor.  On metoprolol.  Doing well.  Follow.  Evaluated by cardiology.       Stress    Discussed with her today.  Overall doing well.  Follow.        Other Visit Diagnoses    Need for influenza vaccination    -  Primary   Relevant Orders   Flu vaccine HIGH DOSE PF (Fluzone High dose) (Completed)       Einar Pheasant, MD

## 2017-12-02 NOTE — Patient Instructions (Signed)
Stop plendil

## 2017-12-05 ENCOUNTER — Encounter: Payer: Self-pay | Admitting: Internal Medicine

## 2017-12-05 NOTE — Assessment & Plan Note (Signed)
Follow met b and a1c.  

## 2017-12-05 NOTE — Assessment & Plan Note (Signed)
Followed by dermatology

## 2017-12-05 NOTE — Assessment & Plan Note (Signed)
Found on monitor.  On metoprolol.  Doing well.  Follow.  Evaluated by cardiology.

## 2017-12-05 NOTE — Assessment & Plan Note (Signed)
Slightly decreased white blood cell count.  Differential wnl.  Follow cbc.

## 2017-12-05 NOTE — Assessment & Plan Note (Signed)
Blood pressure as outlined.  Will stop plendil.  Have her spot check her pressures.  Adjust medication as needed.

## 2017-12-05 NOTE — Assessment & Plan Note (Signed)
Discussed with her today.  Overall doing well.  Follow.   

## 2017-12-14 ENCOUNTER — Telehealth: Payer: Self-pay

## 2017-12-14 NOTE — Telephone Encounter (Signed)
Spoke with patient. She just wanted to make Dr. Nicki Reaper aware that she has started taking her felodipine again. She stopped for a few days and blood pressure started trending up so she restarted the medication

## 2017-12-14 NOTE — Telephone Encounter (Signed)
Copied from [place] [id number]. Topic: Inquiry >> Dec 14, 2017 11:30 AM Scherrie Gerlach wrote: pt states there e has been a change in her med and she was supposed to call trisha and explain what is going on with her.  Declined to provide any other infor

## 2017-12-14 NOTE — Telephone Encounter (Signed)
Noted.  Have her spot check her pressure and send in readings.  Let me know if any problems.

## 2017-12-14 NOTE — Telephone Encounter (Signed)
Patient aware and agrees to do so

## 2018-01-10 ENCOUNTER — Telehealth: Payer: Self-pay

## 2018-01-10 NOTE — Telephone Encounter (Signed)
Patient aware and will continue same meds

## 2018-01-10 NOTE — Telephone Encounter (Signed)
Blood pressures reviewed.  Varying, but overall ok.  (some low 100s).  Confirm she is doing ok and no problems.  If no problems, continue current medication as she is doing.

## 2018-01-10 NOTE — Telephone Encounter (Signed)
I have placed BP readings in your result folder for review

## 2018-01-19 DIAGNOSIS — L570 Actinic keratosis: Secondary | ICD-10-CM | POA: Diagnosis not present

## 2018-01-19 DIAGNOSIS — X32XXXA Exposure to sunlight, initial encounter: Secondary | ICD-10-CM | POA: Diagnosis not present

## 2018-01-19 DIAGNOSIS — Z85828 Personal history of other malignant neoplasm of skin: Secondary | ICD-10-CM | POA: Diagnosis not present

## 2018-01-19 DIAGNOSIS — Z85820 Personal history of malignant melanoma of skin: Secondary | ICD-10-CM | POA: Diagnosis not present

## 2018-01-19 DIAGNOSIS — L821 Other seborrheic keratosis: Secondary | ICD-10-CM | POA: Diagnosis not present

## 2018-01-19 DIAGNOSIS — Z08 Encounter for follow-up examination after completed treatment for malignant neoplasm: Secondary | ICD-10-CM | POA: Diagnosis not present

## 2018-01-25 ENCOUNTER — Other Ambulatory Visit: Payer: Self-pay | Admitting: Internal Medicine

## 2018-03-22 DIAGNOSIS — M65342 Trigger finger, left ring finger: Secondary | ICD-10-CM | POA: Diagnosis not present

## 2018-03-25 ENCOUNTER — Other Ambulatory Visit: Payer: Self-pay | Admitting: Internal Medicine

## 2018-03-28 DIAGNOSIS — N838 Other noninflammatory disorders of ovary, fallopian tube and broad ligament: Secondary | ICD-10-CM | POA: Diagnosis not present

## 2018-03-28 DIAGNOSIS — N888 Other specified noninflammatory disorders of cervix uteri: Secondary | ICD-10-CM | POA: Diagnosis not present

## 2018-03-28 DIAGNOSIS — D251 Intramural leiomyoma of uterus: Secondary | ICD-10-CM | POA: Diagnosis not present

## 2018-04-05 ENCOUNTER — Other Ambulatory Visit (INDEPENDENT_AMBULATORY_CARE_PROVIDER_SITE_OTHER): Payer: PPO

## 2018-04-05 DIAGNOSIS — D72819 Decreased white blood cell count, unspecified: Secondary | ICD-10-CM | POA: Diagnosis not present

## 2018-04-05 DIAGNOSIS — R739 Hyperglycemia, unspecified: Secondary | ICD-10-CM

## 2018-04-05 DIAGNOSIS — E7800 Pure hypercholesterolemia, unspecified: Secondary | ICD-10-CM

## 2018-04-05 DIAGNOSIS — I10 Essential (primary) hypertension: Secondary | ICD-10-CM

## 2018-04-05 LAB — CBC WITH DIFFERENTIAL/PLATELET
Basophils Absolute: 0 10*3/uL (ref 0.0–0.1)
Basophils Relative: 0.7 % (ref 0.0–3.0)
Eosinophils Absolute: 0.1 10*3/uL (ref 0.0–0.7)
Eosinophils Relative: 2.2 % (ref 0.0–5.0)
HCT: 43.3 % (ref 36.0–46.0)
Hemoglobin: 14.7 g/dL (ref 12.0–15.0)
Lymphocytes Relative: 24 % (ref 12.0–46.0)
Lymphs Abs: 1 10*3/uL (ref 0.7–4.0)
MCHC: 33.8 g/dL (ref 30.0–36.0)
MCV: 97.7 fl (ref 78.0–100.0)
Monocytes Absolute: 0.6 10*3/uL (ref 0.1–1.0)
Monocytes Relative: 13 % — ABNORMAL HIGH (ref 3.0–12.0)
Neutro Abs: 2.6 10*3/uL (ref 1.4–7.7)
Neutrophils Relative %: 60.1 % (ref 43.0–77.0)
Platelets: 212 10*3/uL (ref 150.0–400.0)
RBC: 4.43 Mil/uL (ref 3.87–5.11)
RDW: 13 % (ref 11.5–15.5)
WBC: 4.3 10*3/uL (ref 4.0–10.5)

## 2018-04-05 LAB — LIPID PANEL
Cholesterol: 207 mg/dL — ABNORMAL HIGH (ref 0–200)
HDL: 93.1 mg/dL (ref >39.00)
LDL Cholesterol: 90 mg/dL (ref 0–99)
NONHDL: 114.27
Total CHOL/HDL Ratio: 2
Triglycerides: 119 mg/dL (ref 0.0–149.0)
VLDL: 23.8 mg/dL (ref 0.0–40.0)

## 2018-04-05 LAB — BASIC METABOLIC PANEL
BUN: 23 mg/dL (ref 6–23)
CALCIUM: 10.6 mg/dL — AB (ref 8.4–10.5)
CO2: 31 mEq/L (ref 19–32)
Chloride: 100 mEq/L (ref 96–112)
Creatinine, Ser: 0.9 mg/dL (ref 0.40–1.20)
GFR: 59.36 mL/min — ABNORMAL LOW (ref >60.00)
Glucose, Bld: 96 mg/dL (ref 70–99)
POTASSIUM: 4.5 meq/L (ref 3.5–5.1)
SODIUM: 139 meq/L (ref 135–145)

## 2018-04-05 LAB — HEPATIC FUNCTION PANEL
ALT: 16 U/L (ref 0–35)
AST: 19 U/L (ref 0–37)
Albumin: 4.5 g/dL (ref 3.5–5.2)
Alkaline Phosphatase: 60 U/L (ref 39–117)
Bilirubin, Direct: 0.1 mg/dL (ref 0.0–0.3)
Total Bilirubin: 0.7 mg/dL (ref 0.2–1.2)
Total Protein: 6.9 g/dL (ref 6.0–8.3)

## 2018-04-05 LAB — HEMOGLOBIN A1C: Hgb A1c MFr Bld: 5.6 % (ref 4.6–6.5)

## 2018-04-07 ENCOUNTER — Encounter: Payer: Self-pay | Admitting: Internal Medicine

## 2018-04-07 ENCOUNTER — Ambulatory Visit (INDEPENDENT_AMBULATORY_CARE_PROVIDER_SITE_OTHER): Payer: PPO | Admitting: Internal Medicine

## 2018-04-07 VITALS — BP 126/78 | HR 64 | Temp 97.6°F | Resp 16 | Ht 60.0 in | Wt 129.8 lb

## 2018-04-07 DIAGNOSIS — I471 Supraventricular tachycardia: Secondary | ICD-10-CM

## 2018-04-07 DIAGNOSIS — Z0000 Encounter for general adult medical examination without abnormal findings: Secondary | ICD-10-CM

## 2018-04-07 DIAGNOSIS — I10 Essential (primary) hypertension: Secondary | ICD-10-CM

## 2018-04-07 DIAGNOSIS — Z85820 Personal history of malignant melanoma of skin: Secondary | ICD-10-CM

## 2018-04-07 DIAGNOSIS — E2839 Other primary ovarian failure: Secondary | ICD-10-CM | POA: Diagnosis not present

## 2018-04-07 DIAGNOSIS — E7800 Pure hypercholesterolemia, unspecified: Secondary | ICD-10-CM | POA: Diagnosis not present

## 2018-04-07 DIAGNOSIS — R739 Hyperglycemia, unspecified: Secondary | ICD-10-CM | POA: Diagnosis not present

## 2018-04-07 MED ORDER — FELODIPINE ER 2.5 MG PO TB24: 2.5000 mg | ORAL_TABLET | Freq: Every day | ORAL | 3 refills | Status: DC

## 2018-04-07 NOTE — Progress Notes (Signed)
Patient ID: Erin [name], female   DOB: [birth year], 85 y.o.   MRN: [id number]   Subjective:    Patient ID: Erin [name], female    DOB: [birth year], 85 y.o.   MRN: [id number]  HPI  Patient here for her physical exam.  She reports she is doing well.  Feels good.  Stays active.  Walking.  No chest pain.  No sob.  No acid reflux.  No abdominal pain.  Bowels moving.  No urine change.  S/p fourth finger injection for trigger finger.  Doing better.  Wearing splint.  Handling stress.     Past Medical History:  Diagnosis Date  . Chest pain 07/13/2016  . History of colon polyps   . Hypercholesterolemia   . Hypertension    Past Surgical History:  Procedure Laterality Date  . APPENDECTOMY  1946  . CATARACT EXTRACTION Bilateral   . DILATION AND CURETTAGE OF UTERUS    . LAMINECTOMY  2000  . release of foraminal stenosis  2003   Family History  Problem Relation Age of Onset  . Breast cancer Maternal Aunt   . Hypertension Mother   . Hypertension Sister   . Heart disease Brother        S/P CABG  . Multiple sclerosis Sister        died 75   Social History   Socioeconomic History  . Marital status: Widowed    Spouse name: Not on file  . Number of children: 4  . Years of education: Not on file  . Highest education level: Not on file  Occupational History  . Occupation: retired  Scientific laboratory technician  . Financial resource strain: Not on file  . Food insecurity:    Worry: Not on file    Inability: Not on file  . Transportation needs:    Medical: Not on file    Non-medical: Not on file  Tobacco Use  . Smoking status: Former Research scientist (life sciences)  . Smokeless tobacco: Never Used  Substance and Sexual Activity  . Alcohol use: Yes    Alcohol/week: 0.0 standard drinks    Comment: rare  . Drug use: No  . Sexual activity: Never  Lifestyle  . Physical activity:    Days per week: Not on file    Minutes per session: Not on file  . Stress: Not on file  Relationships  . Social connections:    Talks on  phone: Not on file    Gets together: Not on file    Attends religious service: Not on file    Active member of club or organization: Not on file    Attends meetings of clubs or organizations: Not on file    Relationship status: Not on file  Other Topics Concern  . Not on file  Social History Narrative  . Not on file    Outpatient Encounter Medications as of 04/07/2018  Medication Sig  . benazepril (LOTENSIN) 40 MG tablet Take 1 tablet (40 mg total) by mouth 2 (two) times daily.  . Calcium Carbonate-Vitamin D (CALCIUM 600/VITAMIN D) 600-400 MG-UNIT per tablet Take 1 tablet by mouth daily.  . felodipine (PLENDIL) 2.5 MG 24 hr tablet Take 1 tablet (2.5 mg total) by mouth daily.  . furosemide (LASIX) 20 MG tablet Take 1 tablet (20 mg total) by mouth daily.  . metoprolol succinate (TOPROL-XL) 25 MG 24 hr tablet Take 0.5 tablets (12.5 mg total) by mouth daily.  . Multiple Vitamin (MULTIVITAMIN) tablet Take 1 tablet by mouth daily.  Marland Kitchen  Omega-3 Fatty Acids (FISH OIL) 1000 MG CAPS Take by mouth daily.  . rosuvastatin (CRESTOR) 5 MG tablet Take 1 tablet (5 mg total) by mouth daily.  . [DISCONTINUED] felodipine (PLENDIL) 2.5 MG 24 hr tablet Take 1 tablet (2.5 mg total) by mouth daily.   No facility-administered encounter medications on file as of 04/07/2018.     Review of Systems  Constitutional: Negative for appetite change and unexpected weight change.  HENT: Negative for congestion and sinus pressure.   Eyes: Negative for pain and visual disturbance.  Respiratory: Negative for cough, chest tightness and shortness of breath.   Cardiovascular: Negative for chest pain, palpitations and leg swelling.  Gastrointestinal: Negative for abdominal pain, diarrhea, nausea and vomiting.  Genitourinary: Negative for difficulty urinating and dysuria.  Musculoskeletal: Negative for joint swelling and myalgias.  Skin: Negative for color change and rash.  Neurological: Negative for dizziness,  light-headedness and headaches.  Hematological: Negative for adenopathy. Does not bruise/bleed easily.  Psychiatric/Behavioral: Negative for agitation and dysphoric mood.       Objective:    Physical Exam Constitutional:      General: She is not in acute distress.    Appearance: Normal appearance. She is well-developed.  HENT:     Nose: Nose normal.  Eyes:     General: No scleral icterus.       Right eye: No discharge.        Left eye: No discharge.  Neck:     Musculoskeletal: Neck supple.     Thyroid: No thyromegaly.  Cardiovascular:     Rate and Rhythm: Normal rate and regular rhythm.  Pulmonary:     Effort: No tachypnea, accessory muscle usage or respiratory distress.     Breath sounds: Normal breath sounds. No decreased breath sounds, wheezing or rhonchi.  Chest:     Breasts:        Right: No inverted nipple, mass, nipple discharge or tenderness (no axillary adenopathy).        Left: No inverted nipple, mass, nipple discharge or tenderness (no axilarry adenopathy).  Abdominal:     General: Bowel sounds are normal.     Palpations: Abdomen is soft.     Tenderness: There is no abdominal tenderness.  Musculoskeletal:        General: No tenderness.  Lymphadenopathy:     Cervical: No cervical adenopathy.  Skin:    Findings: No erythema or rash.  Neurological:     Mental Status: She is alert and oriented to person, place, and time.  Psychiatric:        Mood and Affect: Mood normal.        Behavior: Behavior normal.     BP 126/78 (BP Location: Left Arm, Patient Position: Sitting, Cuff Size: Normal)   Pulse 64   Temp 97.6 F (36.4 C) (Oral)   Resp 16   Ht 5' (1.524 m)   Wt 129 lb 12.8 oz (58.9 kg)   SpO2 97%   BMI 25.35 kg/m  Wt Readings from Last 3 Encounters:  04/07/18 129 lb 12.8 oz (58.9 kg)  12/02/17 127 lb (57.6 kg)  07/26/17 126 lb 12.8 oz (57.5 kg)     Lab Results  Component Value Date   WBC 4.3 04/05/2018   HGB 14.7 04/05/2018   HCT 43.3  04/05/2018   PLT 212.0 04/05/2018   GLUCOSE 96 04/05/2018   CHOL 207 (H) 04/05/2018   TRIG 119.0 04/05/2018   HDL 93.10 04/05/2018   [place] 90 04/05/2018  ALT 16 04/05/2018   AST 19 04/05/2018   NA 139 04/05/2018   K 4.5 04/05/2018   CL 100 04/05/2018   CREATININE 0.90 04/05/2018   BUN 23 04/05/2018   CO2 31 04/05/2018   TSH 1.46 11/30/2017   HGBA1C 5.6 04/05/2018    Nm Myocar Multi W/spect W/wall Motion / Ef  Result Date: 07/13/2016  Blood pressure demonstrated a normal response to exercise.  There was no ST segment deviation noted during stress.  No T wave inversion was noted during stress.  The study is normal.  This is a low risk study.  The left ventricular ejection fraction is hyperdynamic (>65%).        Assessment & Plan:   Problem List Items Addressed This Visit    Health care maintenance    Physical today 04/07/18.  Declines f/u mammogram.       History of melanoma    Followed by dermatology.       Hypercholesterolemia    On crestor.  Low cholesterol diet and exercise.  Follow lipid panel and liver function tests.        Relevant Medications   felodipine (PLENDIL) 2.5 MG 24 hr tablet   Hyperglycemia    Low carb diet and exercise.  Follow met b and a1c.        Hypertension    Blood pressure under good control.  Continue same medication regimen.  Follow pressures.  Follow metabolic panel.        Relevant Medications   felodipine (PLENDIL) 2.5 MG 24 hr tablet   Narrow complex tachycardia ([facility])    Found on monitor.  Has seen cardiology.  On metoprolol.  Stable.         Other Visit Diagnoses    Routine general medical examination at a health care facility    -  Primary   Estrogen deficiency       Relevant Orders   DG Bone Density   Hypercalcemia       Relevant Orders   Calcium       Charlene Scott, MD  

## 2018-04-07 NOTE — Assessment & Plan Note (Signed)
Physical today 04/07/18.  Declines f/u mammogram.

## 2018-04-10 ENCOUNTER — Encounter: Payer: Self-pay | Admitting: Internal Medicine

## 2018-04-10 NOTE — Assessment & Plan Note (Signed)
Followed by dermatology

## 2018-04-10 NOTE — Assessment & Plan Note (Signed)
Low carb diet and exercise.  Follow met b and a1c.   

## 2018-04-10 NOTE — Assessment & Plan Note (Signed)
Blood pressure under good control.  Continue same medication regimen.  Follow pressures.  Follow metabolic panel.   

## 2018-04-10 NOTE — Assessment & Plan Note (Signed)
On crestor.  Low cholesterol diet and exercise.  Follow lipid panel and liver function tests.   

## 2018-04-10 NOTE — Assessment & Plan Note (Signed)
Found on monitor.  Has seen cardiology.  On metoprolol.  Stable.

## 2018-04-11 DIAGNOSIS — M810 Age-related osteoporosis without current pathological fracture: Secondary | ICD-10-CM | POA: Diagnosis not present

## 2018-04-11 DIAGNOSIS — M8588 Other specified disorders of bone density and structure, other site: Secondary | ICD-10-CM | POA: Diagnosis not present

## 2018-04-11 LAB — HM DEXA SCAN

## 2018-04-26 ENCOUNTER — Ambulatory Visit (INDEPENDENT_AMBULATORY_CARE_PROVIDER_SITE_OTHER): Payer: PPO

## 2018-04-26 ENCOUNTER — Other Ambulatory Visit: Payer: PPO

## 2018-04-26 VITALS — BP 118/62 | HR 57 | Temp 97.5°F | Resp 15 | Ht 59.0 in | Wt 129.0 lb

## 2018-04-26 DIAGNOSIS — Z0000 Encounter for general adult medical examination without abnormal findings: Secondary | ICD-10-CM | POA: Diagnosis not present

## 2018-04-26 LAB — CALCIUM: Calcium: 10.2 mg/dL (ref 8.4–10.5)

## 2018-04-26 NOTE — Patient Instructions (Addendum)
  Ms. Gronau , Thank you for taking time to come for your Medicare Wellness Visit. I appreciate your ongoing commitment to your health goals. Please review the following plan we discussed and let me know if I can assist you in the future.   These are the goals we discussed: Goals    . Maintain Healthy Lifestyle       This is a list of the screening recommended for you and due dates:  Health Maintenance  Topic Date Due  . Tetanus Vaccine  06/01/2022  . Flu Shot  Completed  . DEXA scan (bone density measurement)  Completed  . Pneumonia vaccines  Completed  . Mammogram  Discontinued

## 2018-04-26 NOTE — Progress Notes (Signed)
Subjective:   Erin [name] is a 85 y.o. female who presents for Medicare Annual (Subsequent) preventive examination.  Review of Systems:  No ROS.  Medicare Wellness Visit. Additional risk factors are reflected in the social history. Cardiac Risk Factors include: advanced age (>12men, >27 women);hypertension     Objective:     Vitals: BP 118/62 (BP Location: Left Arm, Patient Position: Sitting, Cuff Size: Normal)   Pulse (!) 57   Temp (!) 97.5 F (36.4 C) (Oral)   Resp 15   Ht 4\' 11"  (1.499 m)   Wt 129 lb (58.5 kg)   SpO2 98%   BMI 26.05 kg/m   Body mass index is 26.05 kg/m.  Advanced Directives 04/26/2018 04/23/2017 06/11/2016 04/22/2016 11/13/2015 04/23/2015  Does Patient Have a Medical Advance Directive? Yes Yes Yes Yes Yes Yes  Type of Paramedic of [place];Living will [place];Living will [place];Living will [place];Living will Living will;Healthcare Power of [place];Living will  Does patient want to make changes to medical advance directive? No - Patient declined No - Patient declined - No - Patient declined - No - Patient declined  Copy of [place] in Chart? Yes - validated most recent copy scanned in chart (See row information) Yes No - copy requested No - copy requested - No - copy requested    Tobacco Social History   Tobacco Use  Smoking Status Former Smoker  Smokeless Tobacco Never Used     Counseling given: Not Answered   Clinical Intake:  Pre-visit preparation completed: Yes        Diabetes: No  How often do you need to have someone help you when you read instructions, pamphlets, or other written materials from your doctor or pharmacy?: 1 - Never  Interpreter Needed?: No     Past Medical History:  Diagnosis Date  . Chest pain 07/13/2016  . History of colon polyps   . Hypercholesterolemia   . Hypertension     Past Surgical History:  Procedure Laterality Date  . APPENDECTOMY  1946  . CATARACT EXTRACTION Bilateral   . DILATION AND CURETTAGE OF UTERUS    . LAMINECTOMY  2000  . release of foraminal stenosis  2003   Family History  Problem Relation Age of Onset  . Breast cancer Maternal Aunt   . Hypertension Mother   . Hypertension Sister   . Heart disease Brother        S/P CABG  . Multiple sclerosis Sister        died 39   Social History   Socioeconomic History  . Marital status: Widowed    Spouse name: Not on file  . Number of children: 4  . Years of education: Not on file  . Highest education level: Not on file  Occupational History  . Occupation: retired  Scientific laboratory technician  . Financial resource strain: Not hard at all  . Food insecurity:    Worry: Never true    Inability: Never true  . Transportation needs:    Medical: No    Non-medical: No  Tobacco Use  . Smoking status: Former Research scientist (life sciences)  . Smokeless tobacco: Never Used  Substance and Sexual Activity  . Alcohol use: Yes    Alcohol/week: 5.0 - 6.0 standard drinks    Types: 5 - 6 Glasses of wine per week    Comment: rare  . Drug use: No  . Sexual activity:  Never  Lifestyle  . Physical activity:    Days per week: 5 days    Minutes per session: 40 min  . Stress: Not at all  Relationships  . Social connections:    Talks on phone: Not on file    Gets together: Not on file    Attends religious service: Not on file    Active member of club or organization: Not on file    Attends meetings of clubs or organizations: Not on file    Relationship status: Not on file  Other Topics Concern  . Not on file  Social History Narrative  . Not on file    Outpatient Encounter Medications as of 04/26/2018  Medication Sig  . benazepril (LOTENSIN) 40 MG tablet Take 1 tablet (40 mg total) by mouth 2 (two) times daily.  . Calcium Carbonate-Vitamin D (CALCIUM 600/VITAMIN D) 600-400 MG-UNIT per tablet Take 1 tablet by mouth daily.  .  felodipine (PLENDIL) 2.5 MG 24 hr tablet Take 1 tablet (2.5 mg total) by mouth daily.  . furosemide (LASIX) 20 MG tablet Take 1 tablet (20 mg total) by mouth daily.  . metoprolol succinate (TOPROL-XL) 25 MG 24 hr tablet Take 0.5 tablets (12.5 mg total) by mouth daily.  . Multiple Vitamin (MULTIVITAMIN) tablet Take 1 tablet by mouth daily.  . Omega-3 Fatty Acids (FISH OIL) 1000 MG CAPS Take by mouth daily.  . rosuvastatin (CRESTOR) 5 MG tablet Take 1 tablet (5 mg total) by mouth daily.   No facility-administered encounter medications on file as of 04/26/2018.     Activities of Daily Living In your present state of health, do you have any difficulty performing the following activities: 04/26/2018  Hearing? Y  Comment Hearing aids  Vision? N  Difficulty concentrating or making decisions? N  Walking or climbing stairs? N  Comment Unsteady gait; walking stick or cane in use with exercise and when raining. Holds railing with stairs.   Dressing or bathing? N  Doing errands, shopping? N  Preparing Food and eating ? N  Using the Toilet? N  In the past six months, have you accidently leaked urine? N  Do you have problems with loss of bowel control? N  Managing your Medications? N  Managing your Finances? N  Housekeeping or managing your Housekeeping? N  Some recent data might be hidden    Patient Care Team: Einar Pheasant, MD as PCP - General (Internal Medicine)    Assessment:   This is a routine wellness examination for Erin [name].  Non fasting labs complete today per note.   Health Screenings  Mammogram -06/29/14 Colonoscopy -05/01/10 Bone Density -02/13/15 Glaucoma -none Hearing -hearing aid Hemoglobin A1C -04/05/18 (5.6) Cholesterol -04/05/18 (207)  Social  Alcohol intake -yes, 5-6 per week Smoking history- former Smokers in home? none Illicit drug use? none Exercise -walking 5-6 days per week Diet -regular Sexually Active -never  Safety  Patient feels safe at home.  Patient  does have smoke detectors at home  Patient does wear sunscreen or protective clothing when in direct sunlight  Patient does wear seat belt when driving or riding with others.   Activities of Daily Living Patient can do their own household chores. Denies needing assistance with: driving, feeding themselves, getting from bed to chair, getting to the toilet, bathing/showering, dressing, managing money, climbing flight of stairs, or preparing meals.   Depression Screen Patient denies losing interest in daily life, feeling hopeless, or crying easily over simple problems.   Fall Screen Patient  denies being afraid of falling or falling in the last year.   Memory Screen Patient denies problems with memory, misplacing items, and is able to balance checkbook/bank accounts.  Patient is alert, normal appearance, oriented to person/place/and time. Correctly identified the president of the [place], recall of 3/3 objects, and performing simple calculations.  Patient displays appropriate judgement and can read correct time from watch face.   Immunizations The following Immunizations are up to date: Influenza, shingles, pneumonia, and tetanus.   Other Providers Patient Care Team: Einar Pheasant, MD as PCP - General (Internal Medicine)  Exercise Activities and Dietary recommendations Current Exercise Habits: Home exercise routine, Type of exercise: walking, Time (Minutes): 45, Frequency (Times/Week): 6, Weekly Exercise (Minutes/Week): 270, Intensity: Moderate  Goals      Patient Stated   . Maintain Healthy Lifestyle (pt-stated)     Stay hydrated  Continue walking regimen Lose a couple of pounds       Fall Risk Fall Risk  04/26/2018 04/23/2017 03/26/2017 04/22/2016 11/13/2015  Falls in the past year? 0 No No No No    Depression Screen PHQ 2/9 Scores 04/26/2018 04/23/2017 03/26/2017 04/22/2016  PHQ - 2 Score 0 0 0 0     Cognitive Function MMSE - Mini Mental State Exam 04/23/2015  Orientation to time 5    Orientation to Place 5  Registration 3  Attention/ Calculation 5  Recall 3  Language- name 2 objects 2  Language- repeat 1  Language- follow 3 step command 3  Language- read & follow direction 1  Write a sentence 1  Copy design 1  Total score 30     6CIT Screen 04/26/2018 04/23/2017 04/22/2016  What Year? 0 points 0 points 0 points  What month? 0 points 0 points 0 points  What time? 0 points 0 points 0 points  Count back from 20 0 points 0 points 0 points  Months in reverse 0 points 0 points 0 points  Repeat phrase 0 points 0 points 0 points  Total Score 0 0 0    Immunization History  Administered Date(s) Administered  . Influenza Split 12/05/2013  . Influenza, High Dose Seasonal PF 11/24/2016, 12/02/2017  . Influenza,inj,Quad PF,6+ Mos 11/30/2012  . Influenza-Unspecified 11/14/2013, 12/11/2014, 12/06/2015  . Pneumococcal Conjugate-13 10/24/2014  . Pneumococcal Polysaccharide-23 11/13/2015  . Tdap 05/31/2012  . Zoster 02/14/2011  . Zoster Recombinat (Shingrix) 12/23/2016, 03/26/2017    Screening Tests Health Maintenance  Topic Date Due  . TETANUS/TDAP  06/01/2022  . INFLUENZA VACCINE  Completed  . DEXA SCAN  Completed  . PNA vac Low Risk Adult  Completed  . MAMMOGRAM  Discontinued      Plan:    End of life planning; Advance aging; Advanced directives discussed. Copy of current HCPOA/Living Will on file.    I have personally reviewed and noted the following in the patient's chart:   . Medical and social history . Use of alcohol, tobacco or illicit drugs  . Current medications and supplements . Functional ability and status . Nutritional status . Physical activity . Advanced directives . List of other physicians . Hospitalizations, surgeries, and ER visits in previous 12 months . Vitals . Screenings to include cognitive, depression, and falls . Referrals and appointments  In addition, I have reviewed and discussed with patient certain preventive protocols,  quality metrics, and best practice recommendations. A written personalized care plan for preventive services as well as general preventive health recommendations were provided to patient.     Lynder Parents  L, LPN  09/15/4033   Reviewed above information.  Agree with assessment and plan.    Dr Nicki Reaper

## 2018-06-22 ENCOUNTER — Telehealth: Payer: Self-pay

## 2018-06-22 NOTE — Telephone Encounter (Signed)
Spoke with patient regarding changing 07/08/2018 face to face visit with Dr. Saunders Revel to a telephone or video call due to [place] precautions however patient states she does not "think this will be beneficial to her and although she is not having any problems at this time she prefers to wait until she can be physically examined as well". Informed patient that it may be a while until she is seen depending on how many patients are waiting to be rescheduled  and she states she is fine with that.

## 2018-06-22 NOTE — Telephone Encounter (Signed)
Call attempted to reschedule 07/08/2018 face to face visit with Dr. Saunders Revel to a telephone or video visit due to current clinic policies related to [place] precautions. Left voicemail message requesting for patient to call back to discuss.

## 2018-06-23 ENCOUNTER — Other Ambulatory Visit: Payer: Self-pay | Admitting: Internal Medicine

## 2018-06-23 NOTE — Telephone Encounter (Signed)
Appointment cancelled. Recall for in 3 months put into Epic.

## 2018-07-08 ENCOUNTER — Ambulatory Visit: Payer: PPO | Admitting: Internal Medicine

## 2018-07-22 ENCOUNTER — Other Ambulatory Visit: Payer: Self-pay | Admitting: Internal Medicine

## 2018-08-15 DIAGNOSIS — H26493 Other secondary cataract, bilateral: Secondary | ICD-10-CM | POA: Diagnosis not present

## 2018-09-06 DIAGNOSIS — L0101 Non-bullous impetigo: Secondary | ICD-10-CM | POA: Diagnosis not present

## 2018-09-12 NOTE — Progress Notes (Signed)
Follow-up Outpatient Visit Date: 09/14/2018  Primary Care Provider: Einar Pheasant, [place] [address] [id number]  Chief Complaint: Follow-up atrial arrhythmia  HPI:  Erin [name] is a 85 y.o. year-old female with history of atrial arrhythmia, hypertension, and hyperlipidemia, who presents for follow-up of atrial arrhythmia.  I last saw her in 06/2017, at which time she was doing well.  Her arrhythmia had previously been labeled atrial fibrillation, though there was no clear evidence of that based on review of prior records.  We agreed to defer additional testing at that time.  I suggested discontinuation of aspirin for primary prevention, which the patient wished to discuss with her PCP first.  Today, Erin [name] reports that she has been feeling well.  She continues have occasional brief chest pressure that she relates to her cervical spine problems.  It has been unchanged for years.  Discomfort typically lasts a few minutes and is not exertional.  There are no associated symptoms.  She denies shortness of breath, palpitations, lightheadedness, and edema.  Blood pressure is typically well controlled, at or below 120/70.  She is tolerating her medications well.  She is using furosemide a few days a week on an as-needed basis for swelling.  She walks daily without any limitations.  --------------------------------------------------------------------------------------------------  Cardiovascular History & Procedures: Cardiovascular Problems:  Paroxysmal atrial arrhythmia  Atypical chest pain  Risk Factors:  Hypertension, hyperlipidemia, and age greater than 48  Cath/PCI:  None  CV Surgery:  None  EP Procedures and Devices:  Event monitor (10/07/16):Predominantly sinus rhythm with frequent PACs and occasional PVCs. Single episode of NSVT. Multiple atrial runs lasting up to 19.4 seconds. No sustained arrhythmias.  Event monitor (07/07/16):  Predominantly sinus rhythm with 20 episodes of narrow complex tachycardia and irregularity consistent with atrial fibrillation (longest episode noted approximately 12 seconds). Frequent PACs and rare PVCs were noted.  Non-Invasive Evaluation(s):  TTE (08/06/16): Normal LV size with LVEF of 55-65%. Normal wall motion with grade 1 diastolic dysfunction. Mild left atrial enlargement. Normal RV size and function.  Exercise MPI (07/13/16): Low risk study without ischemia or scar. LVEF greater than 65%. Decreased exercise capacity, exercising 4 minutes, zero seconds, achieving 117 bpm (90% MPHR; 4.6 METs).  Recent CV Pertinent Labs: Lab Results  Component Value Date   CHOL 207 (H) 04/05/2018   HDL 93.10 04/05/2018   LDLCALC 90 04/05/2018   TRIG 119.0 04/05/2018   CHOLHDL 2 04/05/2018   K 4.5 04/05/2018   K 3.5 12/21/2011   MG 2.7 (H) 04/01/2017   BUN 23 04/05/2018   CREATININE 0.90 04/05/2018   CREATININE 0.84 12/28/2011    Past medical and surgical history were reviewed and updated in EPIC.  Current Meds  Medication Sig  . benazepril (LOTENSIN) 40 MG tablet Take 1 tablet (40 mg total) by mouth 2 (two) times daily.  . Calcium Carbonate-Vitamin D (CALCIUM 600/VITAMIN D) 600-400 MG-UNIT per tablet Take 1 tablet by mouth daily.  . felodipine (PLENDIL) 2.5 MG 24 hr tablet Take 1 tablet (2.5 mg total) by mouth daily.  . furosemide (LASIX) 20 MG tablet Take 1 tablet (20 mg total) by mouth daily. (Patient taking differently: Take 20 mg by mouth daily as needed. )  . metoprolol succinate (TOPROL-XL) 25 MG 24 hr tablet Take 0.5 tablets (12.5 mg total) by mouth daily.  . Multiple Vitamin (MULTIVITAMIN) tablet Take 1 tablet by mouth daily.  . Omega-3 Fatty Acids (FISH OIL) 1000 MG CAPS Take by mouth daily.  Marland Kitchen  rosuvastatin (CRESTOR) 5 MG tablet Take 1 tablet (5 mg total) by mouth daily.    Allergies: Erythromycin and Minocin [minocycline hcl]  Social History   Tobacco Use  . Smoking status:  Former Research scientist (life sciences)  . Smokeless tobacco: Never Used  Substance Use Topics  . Alcohol use: Yes    Alcohol/week: 5.0 - 6.0 standard drinks    Types: 5 - 6 Glasses of wine per week    Comment: rare  . Drug use: No    Family History  Problem Relation Age of Onset  . Breast cancer Maternal Aunt   . Hypertension Mother   . Hypertension Sister   . Heart disease Brother        S/P CABG  . Multiple sclerosis Sister        died 90    Review of Systems: A 12-system review of systems was performed and was negative except as noted in the HPI.  --------------------------------------------------------------------------------------------------  Physical Exam: BP 138/70 (BP Location: Left Arm, Patient Position: Sitting, Cuff Size: Normal)   Pulse 68   Ht 4\' 11"  (1.499 m)   Wt 127 lb 8 oz (57.8 kg)   BMI 25.75 kg/m   General: NAD. HEENT: No conjunctival pallor or scleral icterus. Moist mucous membranes.  OP clear. Neck: Supple without lymphadenopathy, thyromegaly, JVD, or HJR. Lungs: Normal work of breathing. Clear to auscultation bilaterally without wheezes or crackles. Heart: Regular rate and rhythm with 1/6 systolic murmur.  No rubs or gallops. Non-displaced PMI. Abd: Bowel sounds present. Soft, NT/ND without hepatosplenomegaly Ext: No lower extremity edema. Radial, PT, and DP pulses are 2+ bilaterally. Skin: Warm and dry without rash.  EKG: Normal sinus rhythm with PACs.  Lab Results  Component Value Date   WBC 4.3 04/05/2018   HGB 14.7 04/05/2018   HCT 43.3 04/05/2018   MCV 97.7 04/05/2018   PLT 212.0 04/05/2018    Lab Results  Component Value Date   NA 139 04/05/2018   K 4.5 04/05/2018   CL 100 04/05/2018   CO2 31 04/05/2018   BUN 23 04/05/2018   CREATININE 0.90 04/05/2018   GLUCOSE 96 04/05/2018   ALT 16 04/05/2018    Lab Results  Component Value Date   CHOL 207 (H) 04/05/2018   HDL 93.10 04/05/2018   LDLCALC 90 04/05/2018   TRIG 119.0 04/05/2018   CHOLHDL 2  04/05/2018    --------------------------------------------------------------------------------------------------  ASSESSMENT AND PLAN: Atrial arrhythmia: Patient has remained asymptomatic.  EKG today again shows sinus rhythm with isolated PACs.  We will continue metoprolol succinate 12.5 mg daily.  No further work-up or intervention at this time.  Hypertension: Blood pressure borderline elevated today but typically better at home.  Ms. Caryl Comes should continue her current medications and follow-up with Dr. Nicki Reaper as previously recommended.  Hyperlipidemia: Cholesterol reasonably well controlled with LDL of 90 on last check in 03/2018.  Ms. Caryl Comes should continue rosuvastatin 5 mg daily.  Atypical chest pain: Chronic and unchanged; felt to be musculoskeletal.  Myocardial perfusion stress test in 06/2016 was low risk without evidence of ischemia or scar.  No further work-up at this time.  Follow-up: Return to clinic in 1 year.  Nelva Bush, MD 09/14/2018 9:38 AM

## 2018-09-13 ENCOUNTER — Telehealth: Payer: Self-pay | Admitting: Internal Medicine

## 2018-09-13 NOTE — Telephone Encounter (Signed)

## 2018-09-14 ENCOUNTER — Ambulatory Visit: Payer: PPO | Admitting: Internal Medicine

## 2018-09-14 ENCOUNTER — Encounter: Payer: Self-pay | Admitting: Internal Medicine

## 2018-09-14 ENCOUNTER — Other Ambulatory Visit: Payer: Self-pay

## 2018-09-14 VITALS — BP 138/70 | HR 68 | Ht 59.0 in | Wt 127.5 lb

## 2018-09-14 DIAGNOSIS — I498 Other specified cardiac arrhythmias: Secondary | ICD-10-CM

## 2018-09-14 DIAGNOSIS — I10 Essential (primary) hypertension: Secondary | ICD-10-CM

## 2018-09-14 DIAGNOSIS — E785 Hyperlipidemia, unspecified: Secondary | ICD-10-CM

## 2018-09-14 DIAGNOSIS — R0789 Other chest pain: Secondary | ICD-10-CM | POA: Diagnosis not present

## 2018-09-14 MED ORDER — METOPROLOL SUCCINATE ER 25 MG PO TB24: 12.5000 mg | ORAL_TABLET | Freq: Every day | ORAL | 3 refills | Status: DC

## 2018-09-14 NOTE — Patient Instructions (Signed)
Medication Instructions:  Your physician recommends that you continue on your current medications as directed. Please refer to the Current Medication list given to you today.  If you need a refill on your cardiac medications before your next appointment, please call your pharmacy.   Lab work: - None ordered.  If you have labs (blood work) drawn today and your tests are completely normal, you will receive your results only by: Marland Kitchen MyChart Message (if you have MyChart) OR . A paper copy in the mail If you have any lab test that is abnormal or we need to change your treatment, we will call you to review the results.  Testing/Procedures: - None ordered.   Follow-Up: At [facility], you and your health needs are our priority.  As part of our continuing mission to provide you with exceptional heart care, we have created designated Provider Care Teams.  These Care Teams include your primary Cardiologist (physician) and Advanced Practice Providers (APPs -  Physician Assistants and Nurse Practitioners) who all work together to provide you with the care you need, when you need it. You will need a follow up appointment in 12 months.  Please call our office 2 months in advance to schedule this appointment.  You may see DR Harrell Gave END or one of the following Advanced Practice Providers on your designated Care Team:   Murray Hodgkins, NP Christell Faith, PA-C . Marrianne Mood, PA-C

## 2018-09-22 ENCOUNTER — Other Ambulatory Visit: Payer: Self-pay | Admitting: Internal Medicine

## 2018-10-03 ENCOUNTER — Telehealth: Payer: Self-pay | Admitting: *Deleted

## 2018-10-03 DIAGNOSIS — I10 Essential (primary) hypertension: Secondary | ICD-10-CM

## 2018-10-03 DIAGNOSIS — R739 Hyperglycemia, unspecified: Secondary | ICD-10-CM

## 2018-10-03 DIAGNOSIS — E785 Hyperlipidemia, unspecified: Secondary | ICD-10-CM

## 2018-10-03 NOTE — Telephone Encounter (Signed)
Please place future orders for lab appt.  

## 2018-10-04 ENCOUNTER — Other Ambulatory Visit: Payer: Self-pay

## 2018-10-04 ENCOUNTER — Other Ambulatory Visit (INDEPENDENT_AMBULATORY_CARE_PROVIDER_SITE_OTHER): Payer: PPO

## 2018-10-04 DIAGNOSIS — I10 Essential (primary) hypertension: Secondary | ICD-10-CM

## 2018-10-04 DIAGNOSIS — R739 Hyperglycemia, unspecified: Secondary | ICD-10-CM | POA: Diagnosis not present

## 2018-10-04 DIAGNOSIS — E785 Hyperlipidemia, unspecified: Secondary | ICD-10-CM | POA: Diagnosis not present

## 2018-10-04 LAB — LIPID PANEL
Cholesterol: 171 mg/dL (ref 0–200)
HDL: 82.4 mg/dL (ref >39.00)
LDL Cholesterol: 72 mg/dL (ref 0–99)
NonHDL: 88.87
Total CHOL/HDL Ratio: 2
Triglycerides: 83 mg/dL (ref 0.0–149.0)
VLDL: 16.6 mg/dL (ref 0.0–40.0)

## 2018-10-04 LAB — HEMOGLOBIN A1C: Hgb A1c MFr Bld: 5.6 % (ref 4.6–6.5)

## 2018-10-04 LAB — BASIC METABOLIC PANEL
BUN: 14 mg/dL (ref 6–23)
CO2: 28 mEq/L (ref 19–32)
Calcium: 9.8 mg/dL (ref 8.4–10.5)
Chloride: 102 mEq/L (ref 96–112)
Creatinine, Ser: 0.87 mg/dL (ref 0.40–1.20)
GFR: 61.66 mL/min (ref >60.00)
Glucose, Bld: 93 mg/dL (ref 70–99)
Potassium: 4.5 mEq/L (ref 3.5–5.1)
Sodium: 138 mEq/L (ref 135–145)

## 2018-10-04 LAB — HEPATIC FUNCTION PANEL
ALT: 17 U/L (ref 0–35)
AST: 18 U/L (ref 0–37)
Albumin: 4.3 g/dL (ref 3.5–5.2)
Alkaline Phosphatase: 55 U/L (ref 39–117)
Bilirubin, Direct: 0.1 mg/dL (ref 0.0–0.3)
Total Bilirubin: 0.7 mg/dL (ref 0.2–1.2)
Total Protein: 6.1 g/dL (ref 6.0–8.3)

## 2018-10-04 LAB — TSH: TSH: 0.88 u[IU]/mL (ref 0.35–4.50)

## 2018-10-04 NOTE — Telephone Encounter (Signed)
Orders placed for labs

## 2018-10-07 ENCOUNTER — Telehealth: Payer: Self-pay

## 2018-10-07 ENCOUNTER — Ambulatory Visit (INDEPENDENT_AMBULATORY_CARE_PROVIDER_SITE_OTHER): Payer: PPO | Admitting: Internal Medicine

## 2018-10-07 ENCOUNTER — Other Ambulatory Visit: Payer: Self-pay

## 2018-10-07 DIAGNOSIS — R0789 Other chest pain: Secondary | ICD-10-CM | POA: Diagnosis not present

## 2018-10-07 DIAGNOSIS — R739 Hyperglycemia, unspecified: Secondary | ICD-10-CM | POA: Diagnosis not present

## 2018-10-07 DIAGNOSIS — I10 Essential (primary) hypertension: Secondary | ICD-10-CM

## 2018-10-07 DIAGNOSIS — E785 Hyperlipidemia, unspecified: Secondary | ICD-10-CM | POA: Diagnosis not present

## 2018-10-07 NOTE — Telephone Encounter (Signed)
Patient was calling back to let us know that she has been using a supplement "super beets" that is supposed to help with cholesterol and heart health.

## 2018-10-07 NOTE — Telephone Encounter (Signed)
Copied from [place]. Topic: General - Other >> Oct 07, 2018  1:50 PM Sheran Luz wrote: Patient requesting call back from [place] at her convenience. Patient would not disclose any other information.

## 2018-10-07 NOTE — Progress Notes (Signed)
Patient ID: Erin [name], female   DOB: 12-27-1930, 85 y.o.   MRN: [id number]   Virtual Visit via telephone Note  This visit type was conducted due to national recommendations for restrictions regarding the COVID-19 pandemic (e.g. social distancing).  This format is felt to be most appropriate for this patient at this time.  All issues noted in this document were discussed and addressed.  No physical exam was performed (except for noted visual exam findings with Video Visits).   I connected with Arturo Morton by  telephone and verified that I am speaking with the correct person using two identifiers. Location patient: home Location provider: work  Persons participating in the telephone visit: patient, provider  I discussed the limitations, risks, security and privacy concerns of performing an evaluation and management service by telephone and the availability of in person appointments.  The patient expressed understanding and agreed to proceed.   Reason for visit: scheduled   HPI: She reports she is doing relatively well.  Feels good.  Sees cardiology.  Last evaluated 09/14/18.  Stable.  Recommended continuing metoprolol.  Felt to have atypical chest pain.  Recommended f/u in one year.  No further cardiac w/up warranted.  States she is still having intermittent chest discomfort.  No triggers. Does not bother her when she is waling or exerting herself.  No sob.  Breathing stable.  Not sure if reproducible.  No acid reflux.  No abdominal pain.  Bowels moving.  Taking crestor.  Discussed labs.  Declines mammogram.     ROS: See pertinent positives and negatives per HPI.  Past Medical History:  Diagnosis Date  . Chest pain 07/13/2016  . History of colon polyps   . Hypercholesterolemia   . Hypertension     Past Surgical History:  Procedure Laterality Date  . APPENDECTOMY  1946  . CATARACT EXTRACTION Bilateral   . DILATION AND CURETTAGE OF UTERUS    . LAMINECTOMY  2000  . release of foraminal  stenosis  2003    Family History  Problem Relation Age of Onset  . Breast cancer Maternal Aunt   . Hypertension Mother   . Hypertension Sister   . Heart disease Brother        S/P CABG  . Multiple sclerosis Sister        died 75    SOCIAL HX: reviewed.    Current Outpatient Medications:  .  benazepril (LOTENSIN) 40 MG tablet, Take 1 tablet (40 mg total) by mouth 2 (two) times daily., Disp: 180 tablet, Rfl: 1 .  Calcium Carbonate-Vitamin D (CALCIUM 600/VITAMIN D) 600-400 MG-UNIT per tablet, Take 1 tablet by mouth daily., Disp: , Rfl:  .  felodipine (PLENDIL) 2.5 MG 24 hr tablet, Take 1 tablet (2.5 mg total) by mouth daily., Disp: 90 tablet, Rfl: 3 .  furosemide (LASIX) 20 MG tablet, Take 1 tablet (20 mg total) by mouth daily., Disp: 30 tablet, Rfl: 0 .  metoprolol succinate (TOPROL-XL) 25 MG 24 hr tablet, Take 0.5 tablets (12.5 mg total) by mouth daily., Disp: 45 tablet, Rfl: 3 .  Multiple Vitamin (MULTIVITAMIN) tablet, Take 1 tablet by mouth daily., Disp: , Rfl:  .  Omega-3 Fatty Acids (FISH OIL) 1000 MG CAPS, Take by mouth daily., Disp: , Rfl:  .  rosuvastatin (CRESTOR) 5 MG tablet, Take 1 tablet (5 mg total) by mouth daily., Disp: 30 tablet, Rfl: 0  EXAM:  GENERAL: alert.  Sounds to be in no acute distress.  Answering questions appropriately.  PSYCH/NEURO: pleasant and cooperative, no obvious depression or anxiety, speech and thought processing grossly intact  ASSESSMENT AND PLAN:  Discussed the following assessment and plan:  Atypical chest pain Pain as outlined.  Has seen cardiology.  Just reevaluated 09/14/18.  Did not feel any further cardiac w/up warranted.  Discussed further w/up.  She will monitor symptoms and assess for possible triggers.  Also will pay more attention if reproducible, etc.  Wants to monitor.  Will notify me if any change or worsening problems.    Hyperglycemia Low carb diet and exercise.  Follow met b and a1c.  a1c just checked 5.6.     Hyperlipidemia On crestor.  Low cholesterol diet and exercise.  Follow lipid panel and liver function tests.    Hypertension Blood pressure has been under good control  Continue current regimen.  Follow pressures.  Follow metabolic panel.      I discussed the assessment and treatment plan with the patient. The patient was provided an opportunity to ask questions and all were answered. The patient agreed with the plan and demonstrated an understanding of the instructions.   The patient was advised to call back or seek an in-person evaluation if the symptoms worsen or if the condition fails to improve as anticipated.  I provided 15 minutes of non-face-to-face time during this encounter.   Einar Pheasant, MD

## 2018-10-09 ENCOUNTER — Encounter: Payer: Self-pay | Admitting: Internal Medicine

## 2018-10-09 NOTE — Assessment & Plan Note (Signed)
On crestor.  Low cholesterol diet and exercise.  Follow lipid panel and liver function tests.   

## 2018-10-09 NOTE — Assessment & Plan Note (Signed)
Blood pressure has been under good control  Continue current regimen.  Follow pressures.  Follow metabolic panel.

## 2018-10-09 NOTE — Assessment & Plan Note (Signed)
Pain as outlined.  Has seen cardiology.  Just reevaluated 09/14/18.  Did not feel any further cardiac w/up warranted.  Discussed further w/up.  She will monitor symptoms and assess for possible triggers.  Also will pay more attention if reproducible, etc.  Wants to monitor.  Will notify me if any change or worsening problems.

## 2018-10-09 NOTE — Assessment & Plan Note (Signed)
Low carb diet and exercise.  Follow met b and a1c.  a1c just checked 5.6.

## 2018-10-21 ENCOUNTER — Other Ambulatory Visit: Payer: Self-pay | Admitting: Internal Medicine

## 2018-12-21 ENCOUNTER — Other Ambulatory Visit: Payer: Self-pay | Admitting: Internal Medicine

## 2019-01-09 DIAGNOSIS — M65331 Trigger finger, right middle finger: Secondary | ICD-10-CM | POA: Diagnosis not present

## 2019-01-24 DIAGNOSIS — M65342 Trigger finger, left ring finger: Secondary | ICD-10-CM | POA: Diagnosis not present

## 2019-02-17 ENCOUNTER — Other Ambulatory Visit: Payer: Self-pay | Admitting: Internal Medicine

## 2019-02-21 DIAGNOSIS — X32XXXA Exposure to sunlight, initial encounter: Secondary | ICD-10-CM | POA: Diagnosis not present

## 2019-02-21 DIAGNOSIS — L728 Other follicular cysts of the skin and subcutaneous tissue: Secondary | ICD-10-CM | POA: Diagnosis not present

## 2019-02-21 DIAGNOSIS — H6123 Impacted cerumen, bilateral: Secondary | ICD-10-CM | POA: Diagnosis not present

## 2019-02-21 DIAGNOSIS — Z85820 Personal history of malignant melanoma of skin: Secondary | ICD-10-CM | POA: Diagnosis not present

## 2019-02-21 DIAGNOSIS — H903 Sensorineural hearing loss, bilateral: Secondary | ICD-10-CM | POA: Diagnosis not present

## 2019-02-21 DIAGNOSIS — Z08 Encounter for follow-up examination after completed treatment for malignant neoplasm: Secondary | ICD-10-CM | POA: Diagnosis not present

## 2019-02-21 DIAGNOSIS — Z85828 Personal history of other malignant neoplasm of skin: Secondary | ICD-10-CM | POA: Diagnosis not present

## 2019-02-21 DIAGNOSIS — L308 Other specified dermatitis: Secondary | ICD-10-CM | POA: Diagnosis not present

## 2019-02-21 DIAGNOSIS — L570 Actinic keratosis: Secondary | ICD-10-CM | POA: Diagnosis not present

## 2019-04-06 ENCOUNTER — Ambulatory Visit: Payer: PPO | Attending: Internal Medicine

## 2019-04-06 DIAGNOSIS — Z23 Encounter for immunization: Secondary | ICD-10-CM | POA: Insufficient documentation

## 2019-04-06 NOTE — Progress Notes (Signed)
   Covid-19 Vaccination Clinic  Name:  Erin [name]    MRN: [id number] DOB: 1931-02-28  04/06/2019  [name] was observed post Covid-19 immunization for 15 minutes without incidence. She was provided with Vaccine Information Sheet and instruction to access the V-Safe system.   [name] was instructed to call 911 with any severe reactions post vaccine: Marland Kitchen Difficulty breathing  . Swelling of your face and throat  . A fast heartbeat  . A bad rash all over your body  . Dizziness and weakness    Immunizations Administered    Name Date Dose VIS Date Route   Pfizer COVID-19 Vaccine 04/06/2019  9:34 AM 0.3 mL 02/24/2019 Intramuscular   Manufacturer: [place]   Lot: [id number]   [place]: [id number]

## 2019-04-20 ENCOUNTER — Other Ambulatory Visit: Payer: Self-pay | Admitting: Internal Medicine

## 2019-04-21 ENCOUNTER — Other Ambulatory Visit: Payer: Self-pay

## 2019-04-21 MED ORDER — FELODIPINE ER 2.5 MG PO TB24: 2.5000 mg | ORAL_TABLET | Freq: Every day | ORAL | 1 refills | Status: DC

## 2019-04-21 NOTE — Telephone Encounter (Signed)
Refill sent to pharmacy.   

## 2019-04-21 NOTE — Telephone Encounter (Signed)
Pt needs refill on felodipine (PLENDIL) 2.5 MG 24 hr tablet. Pt states that she needs in today.

## 2019-04-24 ENCOUNTER — Other Ambulatory Visit: Payer: Self-pay

## 2019-04-26 ENCOUNTER — Other Ambulatory Visit (INDEPENDENT_AMBULATORY_CARE_PROVIDER_SITE_OTHER): Payer: PPO

## 2019-04-26 ENCOUNTER — Other Ambulatory Visit: Payer: Self-pay

## 2019-04-26 DIAGNOSIS — E785 Hyperlipidemia, unspecified: Secondary | ICD-10-CM

## 2019-04-26 DIAGNOSIS — I10 Essential (primary) hypertension: Secondary | ICD-10-CM

## 2019-04-26 DIAGNOSIS — R739 Hyperglycemia, unspecified: Secondary | ICD-10-CM | POA: Diagnosis not present

## 2019-04-26 LAB — HEPATIC FUNCTION PANEL
ALT: 17 U/L (ref 0–35)
AST: 19 U/L (ref 0–37)
Albumin: 4.3 g/dL (ref 3.5–5.2)
Alkaline Phosphatase: 53 U/L (ref 39–117)
Bilirubin, Direct: 0.2 mg/dL (ref 0.0–0.3)
Total Bilirubin: 0.7 mg/dL (ref 0.2–1.2)
Total Protein: 6.9 g/dL (ref 6.0–8.3)

## 2019-04-26 LAB — CBC WITH DIFFERENTIAL/PLATELET
Basophils Absolute: 0 10*3/uL (ref 0.0–0.1)
Basophils Relative: 0.8 % (ref 0.0–3.0)
Eosinophils Absolute: 0.1 10*3/uL (ref 0.0–0.7)
Eosinophils Relative: 2.9 % (ref 0.0–5.0)
HCT: 41.7 % (ref 36.0–46.0)
Hemoglobin: 14.2 g/dL (ref 12.0–15.0)
Lymphocytes Relative: 25 % (ref 12.0–46.0)
Lymphs Abs: 0.8 10*3/uL (ref 0.7–4.0)
MCHC: 34.2 g/dL (ref 30.0–36.0)
MCV: 99 fl (ref 78.0–100.0)
Monocytes Absolute: 0.4 10*3/uL (ref 0.1–1.0)
Monocytes Relative: 12.3 % — ABNORMAL HIGH (ref 3.0–12.0)
Neutro Abs: 2 10*3/uL (ref 1.4–7.7)
Neutrophils Relative %: 59 % (ref 43.0–77.0)
Platelets: 195 10*3/uL (ref 150.0–400.0)
RBC: 4.21 Mil/uL (ref 3.87–5.11)
RDW: 13 % (ref 11.5–15.5)
WBC: 3.4 10*3/uL — ABNORMAL LOW (ref 4.0–10.5)

## 2019-04-26 LAB — BASIC METABOLIC PANEL
BUN: 14 mg/dL (ref 6–23)
CO2: 30 mEq/L (ref 19–32)
Calcium: 10.1 mg/dL (ref 8.4–10.5)
Chloride: 99 mEq/L (ref 96–112)
Creatinine, Ser: 0.87 mg/dL (ref 0.40–1.20)
GFR: 61.58 mL/min (ref >60.00)
Glucose, Bld: 99 mg/dL (ref 70–99)
Potassium: 4.1 mEq/L (ref 3.5–5.1)
Sodium: 137 mEq/L (ref 135–145)

## 2019-04-26 LAB — LIPID PANEL
Cholesterol: 177 mg/dL (ref 0–200)
HDL: 93.6 mg/dL (ref >39.00)
LDL Cholesterol: 69 mg/dL (ref 0–99)
NonHDL: 83.83
Total CHOL/HDL Ratio: 2
Triglycerides: 73 mg/dL (ref 0.0–149.0)
VLDL: 14.6 mg/dL (ref 0.0–40.0)

## 2019-04-26 LAB — HEMOGLOBIN A1C: Hgb A1c MFr Bld: 5.7 % (ref 4.6–6.5)

## 2019-04-27 ENCOUNTER — Ambulatory Visit: Payer: PPO | Attending: Internal Medicine

## 2019-04-27 DIAGNOSIS — Z23 Encounter for immunization: Secondary | ICD-10-CM | POA: Insufficient documentation

## 2019-04-27 NOTE — Progress Notes (Signed)
   Covid-19 Vaccination Clinic  Name:  Erin [name]    MRN: [id number] DOB: [birth year]  04/27/2019  [name] was observed post Covid-19 immunization for 15 minutes without incidence. She was provided with Vaccine Information Sheet and instruction to access the V-Safe system.   [name] was instructed to call 911 with any severe reactions post vaccine: Marland Kitchen Difficulty breathing  . Swelling of your face and throat  . A fast heartbeat  . A bad rash all over your body  . Dizziness and weakness    Immunizations Administered    Name Date Dose VIS Date Route   Pfizer COVID-19 Vaccine 04/27/2019  9:44 AM 0.3 mL 02/24/2019 Intramuscular   Manufacturer: [place]   Lot: [id number]   [place]: [id number]

## 2019-04-28 ENCOUNTER — Ambulatory Visit (INDEPENDENT_AMBULATORY_CARE_PROVIDER_SITE_OTHER): Payer: PPO

## 2019-04-28 ENCOUNTER — Other Ambulatory Visit: Payer: Self-pay

## 2019-04-28 ENCOUNTER — Encounter: Payer: PPO | Admitting: Internal Medicine

## 2019-04-28 ENCOUNTER — Ambulatory Visit: Payer: PPO

## 2019-04-28 VITALS — BP 117/65 | HR 53 | Temp 97.2°F | Ht 59.0 in | Wt 127.0 lb

## 2019-04-28 DIAGNOSIS — Z0000 Encounter for general adult medical examination without abnormal findings: Secondary | ICD-10-CM | POA: Diagnosis not present

## 2019-04-28 NOTE — Progress Notes (Addendum)
Subjective:   Erin [name] is a 86 y.o. female who presents for Medicare Annual (Subsequent) preventive examination.  Review of Systems:  No ROS.  Medicare Wellness Virtual Visit.  Visual/audio telehealth visit, UTA vital signs.   Ht/Wt provided. See social history for additional risk factors.  Cardiac Risk Factors include: advanced age (>89men, >13 women);hypertension     Objective:     Vitals: BP 117/65 (BP Location: Left Arm, Patient Position: Sitting, Cuff Size: Normal)   Pulse (!) 53   Temp (!) 97.2 F (36.2 C) (Oral)   Ht 4\' 11"  (1.499 m)   Wt 127 lb (57.6 kg)   BMI 25.65 kg/m   Body mass index is 25.65 kg/m.  Advanced Directives 04/28/2019 04/26/2018 04/23/2017 06/11/2016 04/22/2016 11/13/2015 04/23/2015  Does Patient Have a Medical Advance Directive? Yes Yes Yes Yes Yes Yes Yes  Type of Paramedic of [place];Living will [place];Living will [place];Living will [place];Living will [place];Living will Living will;Healthcare Power of [place];Living will  Does patient want to make changes to medical advance directive? No - Patient declined No - Patient declined No - Patient declined - No - Patient declined - No - Patient declined  Copy of [place] in Chart? Yes - validated most recent copy scanned in chart (See row information) Yes - validated most recent copy scanned in chart (See row information) Yes No - copy requested No - copy requested - No - copy requested    Tobacco Social History   Tobacco Use  Smoking Status Former Smoker  Smokeless Tobacco Never Used     Counseling given: Not Answered   Clinical Intake:  Pre-visit preparation completed: Yes        Diabetes: No  How often do you need to have someone help you when you read instructions, pamphlets, or other written materials from your doctor or  pharmacy?: 1 - Never  Interpreter Needed?: No     Past Medical History:  Diagnosis Date  . Chest pain 07/13/2016  . History of colon polyps   . Hypercholesterolemia   . Hypertension    Past Surgical History:  Procedure Laterality Date  . APPENDECTOMY  1946  . CATARACT EXTRACTION Bilateral   . DILATION AND CURETTAGE OF UTERUS    . LAMINECTOMY  2000  . release of foraminal stenosis  2003   Family History  Problem Relation Age of Onset  . Breast cancer Maternal Aunt   . Hypertension Mother   . Hypertension Sister   . Heart disease Brother        S/P CABG  . Multiple sclerosis Sister        died 75   Social History   Socioeconomic History  . Marital status: Widowed    Spouse name: Not on file  . Number of children: 4  . Years of education: Not on file  . Highest education level: Not on file  Occupational History  . Occupation: retired  Tobacco Use  . Smoking status: Former Research scientist (life sciences)  . Smokeless tobacco: Never Used  Substance and Sexual Activity  . Alcohol use: Yes    Alcohol/week: 5.0 - 6.0 standard drinks    Types: 5 - 6 Glasses of wine per week    Comment: rare  . Drug use: No  . Sexual activity: Never  Other Topics Concern  . Not on file  Social History Narrative  .  Not on file   Social Determinants of Health   Financial Resource Strain: Low Risk   . Difficulty of Paying Living Expenses: Not hard at all  Food Insecurity: No Food Insecurity  . Worried About Charity fundraiser in the Last Year: Never true  . Ran Out of Food in the Last Year: Never true  Transportation Needs: No Transportation Needs  . Lack of Transportation (Medical): No  . Lack of Transportation (Non-Medical): No  Physical Activity: Sufficiently Active  . Days of Exercise per Week: 5 days  . Minutes of Exercise per Session: 30 min  Stress: No Stress Concern Present  . Feeling of Stress : Not at all  Social Connections: Unknown  . Frequency of Communication with Friends and  Family: More than three times a week  . Frequency of Social Gatherings with Friends and Family: Twice a week  . Attends Religious Services: More than 4 times per year  . Active Member of Clubs or Organizations: Yes  . Attends Archivist Meetings: Not on file  . Marital Status: Not on file    Outpatient Encounter Medications as of 04/28/2019  Medication Sig  . benazepril (LOTENSIN) 40 MG tablet Take 1 tablet (40 mg total) by mouth 2 (two) times daily.  . Calcium Carbonate-Vitamin D (CALCIUM 600/VITAMIN D) 600-400 MG-UNIT per tablet Take 1 tablet by mouth daily.  . felodipine (PLENDIL) 2.5 MG 24 hr tablet Take 1 tablet (2.5 mg total) by mouth daily.  Marland Kitchen FLUAD QUADRIVALENT 0.5 ML injection   . furosemide (LASIX) 20 MG tablet Take 1 tablet (20 mg total) by mouth daily.  . metoprolol succinate (TOPROL-XL) 25 MG 24 hr tablet Take 0.5 tablets (12.5 mg total) by mouth daily.  . Multiple Vitamin (MULTIVITAMIN) tablet Take 1 tablet by mouth daily.  . Omega-3 Fatty Acids (FISH OIL) 1000 MG CAPS Take by mouth daily.  . rosuvastatin (CRESTOR) 5 MG tablet Take 1 tablet (5 mg total) by mouth daily.   No facility-administered encounter medications on file as of 04/28/2019.    Activities of Daily Living In your present state of health, do you have any difficulty performing the following activities: 04/28/2019  Hearing? Y  Comment Hearing aids  Vision? N  Difficulty concentrating or making decisions? N  Walking or climbing stairs? N  Dressing or bathing? N  Doing errands, shopping? N  Preparing Food and eating ? N  Using the Toilet? N  In the past six months, have you accidently leaked urine? N  Do you have problems with loss of bowel control? N  Managing your Medications? N  Managing your Finances? N  Housekeeping or managing your Housekeeping? Y  Comment Maid assist every 4 weeks  Some recent data might be hidden    Patient Care Team: Einar Pheasant, MD as PCP - General (Internal  Medicine)    Assessment:   This is a routine wellness examination for Erin [name].  Nurse connected with patient 04/28/19 at  9:30 AM EST by a telephone enabled telemedicine application and verified that I am speaking with the correct person using two identifiers. Patient stated full name and DOB. Patient gave permission to continue with virtual visit. Patient's location was at home and Nurse's location was at [place] office.   Patient is alert and oriented x3. Patient denies difficulty focusing or concentrating. Patient likes to read, use the computer and complete word and jigsaw puzzles for brain stimulation.   Health Maintenance Due: See completed HM at the end  of note.   Eye: Visual acuity not assessed. Virtual visit. Followed by their ophthalmologist.  Dental: Visits every 4 months.    Hearing: Hearing aids- yes  Safety:  Patient feels safe at home- yes Patient does have smoke detectors at home- yes Patient does wear sunscreen or protective clothing when in direct sunlight - yes Patient does wear seat belt when in a moving vehicle - yes Patient drives- yes Adequate lighting in walkways free from debris- yes Grab bars and handrails used as appropriate- yes Ambulates with an assistive device- yes; cane as needed Cell phone on person when ambulating outside of the home- yes  Social: Alcohol intake - yes      Smoking history- former  Smokers in home? none Illicit drug use? none  Medication: Taking as directed and without issues.  Pill box in use -yes  Self managed - yes   Covid-19: Precautions and sickness symptoms discussed. Wears mask, social distancing, hand hygiene as appropriate.   Activities of Daily Living Patient denies needing assistance with: household chores, feeding themselves, getting from bed to chair, getting to the toilet, bathing/showering, dressing, managing money, or preparing meals.   Discussed the importance of a healthy diet, water intake and the  benefits of aerobic exercise.   Physical activity- walking daily 30-40  Diet:  Regular Water: good intake  Other Providers Patient Care Team: Einar Pheasant, MD as PCP - General (Internal Medicine) Exercise Activities and Dietary recommendations Current Exercise Habits: Home exercise routine, Type of exercise: walking, Time (Minutes): 30, Frequency (Times/Week): 5, Weekly Exercise (Minutes/Week): 150, Intensity: Mild  Goals      Patient Stated   . I want to start playing "Bridge" game again (pt-stated)     Brain stimulating activities       Fall Risk Fall Risk  04/28/2019 04/26/2018 04/23/2017 03/26/2017 04/22/2016  Falls in the past year? 0 0 No No No  Follow up Falls evaluation completed - - - -   Timed Get Up and Go performed: no, virtual visit  Depression Screen PHQ 2/9 Scores 04/28/2019 04/26/2018 04/23/2017 03/26/2017  PHQ - 2 Score 0 0 0 0     Cognitive Function MMSE - Mini Mental State Exam 04/23/2015  Orientation to time 5  Orientation to Place 5  Registration 3  Attention/ Calculation 5  Recall 3  Language- name 2 objects 2  Language- repeat 1  Language- follow 3 step command 3  Language- read & follow direction 1  Write a sentence 1  Copy design 1  Total score 30     6CIT Screen 04/28/2019 04/26/2018 04/23/2017 04/22/2016  What Year? 0 points 0 points 0 points 0 points  What month? 0 points 0 points 0 points 0 points  What time? 0 points 0 points 0 points 0 points  Count back from 20 0 points 0 points 0 points 0 points  Months in reverse 0 points 0 points 0 points 0 points  Repeat phrase 0 points 0 points 0 points 0 points  Total Score 0 0 0 0    Immunization History  Administered Date(s) Administered  . Fluad Quad(high Dose 65+) 11/25/2018  . Influenza Split 12/05/2013  . Influenza, High Dose Seasonal PF 11/24/2016, 12/02/2017  . Influenza,inj,Quad PF,6+ Mos 11/30/2012  . Influenza-Unspecified 11/14/2013, 12/11/2014, 12/06/2015  . PFIZER SARS-COV-2  Vaccination 04/06/2019, 04/27/2019  . Pneumococcal Conjugate-13 10/24/2014  . Pneumococcal Polysaccharide-23 11/13/2015  . Tdap 05/31/2012  . Zoster 02/14/2011  . Zoster Recombinat (Shingrix) 12/23/2016, 03/26/2017  Screening Tests Health Maintenance  Topic Date Due  . TETANUS/TDAP  06/01/2022  . INFLUENZA VACCINE  Completed  . DEXA SCAN  Completed  . PNA vac Low Risk Adult  Completed  . MAMMOGRAM  Discontinued      Plan:   Keep all routine maintenance appointments.   Cpe 05/01/19 @ 11:00  Medicare Attestation I have personally reviewed: The patient's medical and social history Their use of alcohol, tobacco or illicit drugs Their current medications and supplements The patient's functional ability including ADLs,fall risks, home safety risks, cognitive, and hearing and visual impairment Diet and physical activities Evidence for depression   I have reviewed and discussed with patient certain preventive protocols, quality metrics, and best practice recommendations.   Varney Biles, LPN  [id number]   Reviewed above information.  Agree with assessment and plan.    Dr Nicki Reaper

## 2019-04-28 NOTE — Patient Instructions (Addendum)
  Erin [name] , Thank you for taking time to come for your Medicare Wellness Visit. I appreciate your ongoing commitment to your health goals. Please review the following plan we discussed and let me know if I can assist you in the future.   These are the goals we discussed: Goals      Patient Stated   . I want to start playing "Bridge" game again (pt-stated)     Brain stimulating activities       This is a list of the screening recommended for you and due dates:  Health Maintenance  Topic Date Due  . Tetanus Vaccine  06/01/2022  . Flu Shot  Completed  . DEXA scan (bone density measurement)  Completed  . Pneumonia vaccines  Completed  . Mammogram  Discontinued

## 2019-05-01 ENCOUNTER — Ambulatory Visit (INDEPENDENT_AMBULATORY_CARE_PROVIDER_SITE_OTHER): Payer: PPO | Admitting: Internal Medicine

## 2019-05-01 ENCOUNTER — Other Ambulatory Visit: Payer: Self-pay

## 2019-05-01 VITALS — BP 134/68 | HR 70 | Temp 97.6°F | Resp 16 | Wt 129.6 lb

## 2019-05-01 DIAGNOSIS — E785 Hyperlipidemia, unspecified: Secondary | ICD-10-CM | POA: Diagnosis not present

## 2019-05-01 DIAGNOSIS — Z85820 Personal history of malignant melanoma of skin: Secondary | ICD-10-CM

## 2019-05-01 DIAGNOSIS — D72819 Decreased white blood cell count, unspecified: Secondary | ICD-10-CM

## 2019-05-01 DIAGNOSIS — I10 Essential (primary) hypertension: Secondary | ICD-10-CM

## 2019-05-01 DIAGNOSIS — I471 Supraventricular tachycardia: Secondary | ICD-10-CM

## 2019-05-01 DIAGNOSIS — R739 Hyperglycemia, unspecified: Secondary | ICD-10-CM

## 2019-05-01 DIAGNOSIS — Z86010 Personal history of colonic polyps: Secondary | ICD-10-CM

## 2019-05-01 DIAGNOSIS — Z0000 Encounter for general adult medical examination without abnormal findings: Secondary | ICD-10-CM | POA: Diagnosis not present

## 2019-05-01 MED ORDER — BENAZEPRIL HCL 40 MG PO TABS: 40.0000 mg | ORAL_TABLET | Freq: Two times a day (BID) | ORAL | 1 refills | Status: DC

## 2019-05-01 MED ORDER — ROSUVASTATIN CALCIUM 5 MG PO TABS: 5.0000 mg | ORAL_TABLET | Freq: Every day | ORAL | 1 refills | Status: DC

## 2019-05-01 NOTE — Progress Notes (Signed)
Patient ID: Erin [name], female   DOB: 29-Dec-1930, 86 y.o.   MRN: [id number]   Subjective:    Patient ID: Erin [name], female    DOB: 24-Aug-1930, 86 y.o.   MRN: [id number]  HPI This visit occurred during the SARS-CoV-2 public health emergency.  Safety protocols were in place, including screening questions prior to the visit, additional usage of staff PPE, and extensive cleaning of exam room while observing appropriate contact time as indicated for disinfecting solutions.  Patient here for her physical exam. She reports she is doing relatively well.  Feels good.  Tries to stay active - walking.  No chest pain.  No sob.  No acid reflux.  No abdominal pain.  Bowels moving.  S/p trigger finger injection.  Doing well.  Saw cardiology - 09/2018.  On metoprolol.  Stable. No increased heart rate or palpitations.     Past Medical History:  Diagnosis Date  . Chest pain 07/13/2016  . History of colon polyps   . Hypercholesterolemia   . Hypertension    Past Surgical History:  Procedure Laterality Date  . APPENDECTOMY  1946  . CATARACT EXTRACTION Bilateral   . DILATION AND CURETTAGE OF UTERUS    . LAMINECTOMY  2000  . release of foraminal stenosis  2003   Family History  Problem Relation Age of Onset  . Breast cancer Maternal Aunt   . Hypertension Mother   . Hypertension Sister   . Heart disease Brother        S/P CABG  . Multiple sclerosis Sister        died 14   Social History   Socioeconomic History  . Marital status: Widowed    Spouse name: Not on file  . Number of children: 4  . Years of education: Not on file  . Highest education level: Not on file  Occupational History  . Occupation: retired  Tobacco Use  . Smoking status: Former Research scientist (life sciences)  . Smokeless tobacco: Never Used  Substance and Sexual Activity  . Alcohol use: Yes    Alcohol/week: 5.0 - 6.0 standard drinks    Types: 5 - 6 Glasses of wine per week    Comment: rare  . Drug use: No  . Sexual activity: Never  Other  Topics Concern  . Not on file  Social History Narrative  . Not on file   Social Determinants of Health   Financial Resource Strain: Low Risk   . Difficulty of Paying Living Expenses: Not hard at all  Food Insecurity: No Food Insecurity  . Worried About Charity fundraiser in the Last Year: Never true  . Ran Out of Food in the Last Year: Never true  Transportation Needs: No Transportation Needs  . Lack of Transportation (Medical): No  . Lack of Transportation (Non-Medical): No  Physical Activity: Sufficiently Active  . Days of Exercise per Week: 5 days  . Minutes of Exercise per Session: 30 min  Stress: No Stress Concern Present  . Feeling of Stress : Not at all  Social Connections: Unknown  . Frequency of Communication with Friends and Family: More than three times a week  . Frequency of Social Gatherings with Friends and Family: Twice a week  . Attends Religious Services: More than 4 times per year  . Active Member of Clubs or Organizations: Yes  . Attends Archivist Meetings: Not on file  . Marital Status: Not on file    Outpatient Encounter Medications as of 05/01/2019  Medication Sig  . benazepril (LOTENSIN) 40 MG tablet Take 1 tablet (40 mg total) by mouth 2 (two) times daily.  . Calcium Carbonate-Vitamin D (CALCIUM 600/VITAMIN D) 600-400 MG-UNIT per tablet Take 1 tablet by mouth daily.  . felodipine (PLENDIL) 2.5 MG 24 hr tablet Take 1 tablet (2.5 mg total) by mouth daily.  Marland Kitchen FLUAD QUADRIVALENT 0.5 ML injection   . furosemide (LASIX) 20 MG tablet Take 1 tablet (20 mg total) by mouth daily.  . metoprolol succinate (TOPROL-XL) 25 MG 24 hr tablet Take 0.5 tablets (12.5 mg total) by mouth daily.  . Multiple Vitamin (MULTIVITAMIN) tablet Take 1 tablet by mouth daily.  . Omega-3 Fatty Acids (FISH OIL) 1000 MG CAPS Take by mouth daily.  . rosuvastatin (CRESTOR) 5 MG tablet Take 1 tablet (5 mg total) by mouth daily.  . [DISCONTINUED] benazepril (LOTENSIN) 40 MG tablet  Take 1 tablet (40 mg total) by mouth 2 (two) times daily.  . [DISCONTINUED] rosuvastatin (CRESTOR) 5 MG tablet Take 1 tablet (5 mg total) by mouth daily.   No facility-administered encounter medications on file as of 05/01/2019.   Review of Systems  Constitutional: Negative for appetite change and unexpected weight change.  HENT: Negative for congestion and sinus pressure.   Eyes: Negative for pain and visual disturbance.  Respiratory: Negative for cough, chest tightness and shortness of breath.   Cardiovascular: Negative for chest pain, palpitations and leg swelling.  Gastrointestinal: Negative for abdominal pain, diarrhea, nausea and vomiting.  Genitourinary: Negative for difficulty urinating and dysuria.  Musculoskeletal: Negative for joint swelling and myalgias.  Skin: Negative for color change and rash.  Neurological: Negative for dizziness, light-headedness and headaches.  Hematological: Negative for adenopathy. Does not bruise/bleed easily.  Psychiatric/Behavioral: Negative for agitation and dysphoric mood.       Objective:    Physical Exam Constitutional:      General: She is not in acute distress.    Appearance: Normal appearance. She is well-developed.  HENT:     Head: Normocephalic and atraumatic.     Right Ear: External ear normal.     Left Ear: External ear normal.  Eyes:     General: No scleral icterus.       Right eye: No discharge.        Left eye: No discharge.     Conjunctiva/sclera: Conjunctivae normal.  Neck:     Thyroid: No thyromegaly.  Cardiovascular:     Rate and Rhythm: Normal rate and regular rhythm.  Pulmonary:     Effort: No tachypnea, accessory muscle usage or respiratory distress.     Breath sounds: Normal breath sounds. No decreased breath sounds or wheezing.  Chest:     Breasts:        Right: No inverted nipple, mass, nipple discharge or tenderness (no axillary adenopathy).        Left: No inverted nipple, mass, nipple discharge or  tenderness (no axilarry adenopathy).  Abdominal:     General: Bowel sounds are normal.     Palpations: Abdomen is soft.     Tenderness: There is no abdominal tenderness.  Musculoskeletal:        General: No swelling or tenderness.     Cervical back: Neck supple. No tenderness.  Lymphadenopathy:     Cervical: No cervical adenopathy.  Skin:    Findings: No erythema or rash.  Neurological:     Mental Status: She is alert and oriented to person, place, and time.  Psychiatric:  Mood and Affect: Mood normal.        Behavior: Behavior normal.     BP 134/68   Pulse 70   Temp 97.6 F (36.4 C)   Resp 16   Wt 129 lb 9.6 oz (58.8 kg)   SpO2 96%   BMI 26.18 kg/m  Wt Readings from Last 3 Encounters:  05/01/19 129 lb 9.6 oz (58.8 kg)  04/28/19 127 lb (57.6 kg)  10/07/18 127 lb (57.6 kg)     Lab Results  Component Value Date   WBC 3.4 (L) 04/26/2019   HGB 14.2 04/26/2019   HCT 41.7 04/26/2019   PLT 195.0 04/26/2019   GLUCOSE 99 04/26/2019   CHOL 177 04/26/2019   TRIG 73.0 04/26/2019   HDL 93.60 04/26/2019   LDLCALC 69 04/26/2019   ALT 17 04/26/2019   AST 19 04/26/2019   NA 137 04/26/2019   K 4.1 04/26/2019   CL 99 04/26/2019   CREATININE 0.87 04/26/2019   BUN 14 04/26/2019   CO2 30 04/26/2019   TSH 0.88 10/04/2018   HGBA1C 5.7 04/26/2019    NM Myocar Multi W/Spect W/Wall Motion / EF  Result Date: 07/13/2016  Blood pressure demonstrated a normal response to exercise.  There was no ST segment deviation noted during stress.  No T wave inversion was noted during stress.  The study is normal.  This is a low risk study.  The left ventricular ejection fraction is hyperdynamic (>65%).        Assessment & Plan:   Problem List Items Addressed This Visit    Health care maintenance    Physical today 05/01/19.  Declines mammogram.       History of colonic polyps    Colonoscopy 04/2010 - six polyps.  GI felt no further colonoscopy warranted.       History of  melanoma    Followed by dermatology.       Hyperglycemia    Low carb diet and exercise.  Follow met b and a1c.       Hyperlipidemia    On crestor.  Low cholesterol diet and exercise.  Follow lipid panel and liver function tests.        Relevant Medications   benazepril (LOTENSIN) 40 MG tablet   rosuvastatin (CRESTOR) 5 MG tablet   Hypertension - Primary    Blood pressure under good control.  Continue same medication regimen.  Follow pressures.  Follow metabolic panel.        Relevant Medications   benazepril (LOTENSIN) 40 MG tablet   rosuvastatin (CRESTOR) 5 MG tablet   Leukopenia   Relevant Orders   CBC with Differential/Platelet   Narrow complex tachycardia ([place])    Has seen cardiology.  Found on monitor.  On metoprolol.  Stable.           Einar Pheasant, MD

## 2019-05-01 NOTE — Assessment & Plan Note (Signed)
Physical today 05/01/19.  Declines mammogram.

## 2019-05-07 ENCOUNTER — Encounter: Payer: Self-pay | Admitting: Internal Medicine

## 2019-05-07 NOTE — Assessment & Plan Note (Signed)
Has seen cardiology.  Found on monitor.  On metoprolol.  Stable.

## 2019-05-07 NOTE — Assessment & Plan Note (Signed)
On crestor.  Low cholesterol diet and exercise.  Follow lipid panel and liver function tests.   

## 2019-05-07 NOTE — Assessment & Plan Note (Signed)
Low carb diet and exercise.  Follow met b and a1c.  

## 2019-05-07 NOTE — Assessment & Plan Note (Signed)
Blood pressure under good control.  Continue same medication regimen.  Follow pressures.  Follow metabolic panel.   

## 2019-05-07 NOTE — Assessment & Plan Note (Signed)
Followed by dermatology

## 2019-05-07 NOTE — Assessment & Plan Note (Signed)
Colonoscopy 04/2010 - six polyps.  GI felt no further colonoscopy warranted.

## 2019-05-08 ENCOUNTER — Encounter: Payer: PPO | Admitting: Internal Medicine

## 2019-05-29 ENCOUNTER — Other Ambulatory Visit: Payer: Self-pay

## 2019-05-29 ENCOUNTER — Other Ambulatory Visit (INDEPENDENT_AMBULATORY_CARE_PROVIDER_SITE_OTHER): Payer: PPO

## 2019-05-29 DIAGNOSIS — D72819 Decreased white blood cell count, unspecified: Secondary | ICD-10-CM | POA: Diagnosis not present

## 2019-05-29 LAB — CBC WITH DIFFERENTIAL/PLATELET
Basophils Absolute: 0 10*3/uL (ref 0.0–0.1)
Basophils Relative: 0.6 % (ref 0.0–3.0)
Eosinophils Absolute: 0.1 10*3/uL (ref 0.0–0.7)
Eosinophils Relative: 1.4 % (ref 0.0–5.0)
HCT: 40 % (ref 36.0–46.0)
Hemoglobin: 13.4 g/dL (ref 12.0–15.0)
Lymphocytes Relative: 23.5 % (ref 12.0–46.0)
Lymphs Abs: 1 10*3/uL (ref 0.7–4.0)
MCHC: 33.6 g/dL (ref 30.0–36.0)
MCV: 99.8 fl (ref 78.0–100.0)
Monocytes Absolute: 0.4 10*3/uL (ref 0.1–1.0)
Monocytes Relative: 9.9 % (ref 3.0–12.0)
Neutro Abs: 2.9 10*3/uL (ref 1.4–7.7)
Neutrophils Relative %: 64.6 % (ref 43.0–77.0)
Platelets: 201 10*3/uL (ref 150.0–400.0)
RBC: 4.01 Mil/uL (ref 3.87–5.11)
RDW: 13.1 % (ref 11.5–15.5)
WBC: 4.4 10*3/uL (ref 4.0–10.5)

## 2019-06-26 ENCOUNTER — Telehealth: Payer: Self-pay | Admitting: Internal Medicine

## 2019-06-26 DIAGNOSIS — Z85820 Personal history of malignant melanoma of skin: Secondary | ICD-10-CM

## 2019-06-26 NOTE — Telephone Encounter (Signed)
Pt is requesting CT scan. Pt states that Dr Nicki Reaper is aware of why. Please advise

## 2019-06-27 NOTE — Telephone Encounter (Signed)
Patient stated that she discussed having a cxr done at her last appt. She says she has not had one for a long time so she would like to do one to ensure nothing new or changed from cxr in 2018.

## 2019-06-27 NOTE — Telephone Encounter (Signed)
LMTCB. Need more info 

## 2019-06-27 NOTE — Telephone Encounter (Signed)
Pt is wanting a chest xray..her and Dr. Nicki Reaper had discussed this at her last appt per pt

## 2019-06-27 NOTE — Telephone Encounter (Signed)
Patient returned office phone call. 

## 2019-06-28 NOTE — Telephone Encounter (Signed)
Patient is coming in Friday at 1400 for xray. I know we usually don't do appointments since you are both here. I told her to come in at 1400.

## 2019-06-28 NOTE — Telephone Encounter (Signed)
Order placed for cxr.  Please schedule her to come in for xray.

## 2019-06-30 ENCOUNTER — Ambulatory Visit (INDEPENDENT_AMBULATORY_CARE_PROVIDER_SITE_OTHER): Payer: PPO

## 2019-06-30 DIAGNOSIS — Z85820 Personal history of malignant melanoma of skin: Secondary | ICD-10-CM | POA: Diagnosis not present

## 2019-09-15 ENCOUNTER — Other Ambulatory Visit: Payer: Self-pay

## 2019-09-15 ENCOUNTER — Ambulatory Visit: Payer: PPO | Admitting: Family

## 2019-09-15 ENCOUNTER — Encounter: Payer: Self-pay | Admitting: Family

## 2019-09-15 VITALS — BP 140/70 | HR 56 | Ht 60.0 in | Wt 130.0 lb

## 2019-09-15 DIAGNOSIS — E782 Mixed hyperlipidemia: Secondary | ICD-10-CM | POA: Diagnosis not present

## 2019-09-15 DIAGNOSIS — I498 Other specified cardiac arrhythmias: Secondary | ICD-10-CM

## 2019-09-15 DIAGNOSIS — I10 Essential (primary) hypertension: Secondary | ICD-10-CM

## 2019-09-15 MED ORDER — METOPROLOL SUCCINATE ER 25 MG PO TB24: 12.5000 mg | ORAL_TABLET | Freq: Every day | ORAL | 3 refills | Status: DC

## 2019-09-15 NOTE — Progress Notes (Signed)
Office Visit    [name] Name: Erin [name] Date of Encounter: 09/15/2019  Primary Care Provider:  Einar Pheasant, MD Primary Cardiologist:  Nelva Bush, MD Electrophysiologist:  None   Chief Complaint    Erin [name] is a 86 y.o. female with a hx of atrial arrhythmia, HTN, HLD presents today for follow-up of atrial arrhythmia  Past Medical History    Past Medical History:  Diagnosis Date  . Chest pain 07/13/2016  . History of colon polyps   . Hypercholesterolemia   . Hypertension    Past Surgical History:  Procedure Laterality Date  . APPENDECTOMY  1946  . CATARACT EXTRACTION Bilateral   . DILATION AND CURETTAGE OF UTERUS    . LAMINECTOMY  2000  . release of foraminal stenosis  2003    Allergies  Allergies  Allergen Reactions  . Erythromycin Other (See Comments)    GI intolerance   . Minocin [Minocycline Hcl] Other (See Comments)    dizziness    History of Present Illness    [name] is a 86 y.o. female with a hx of atrial arrhythmia, HLD, HTN last seen 09/2018 by Dr. Saunders Revel.  She has been previously evaluated for atrial arrhythmia.  Per Dr. Darnelle Bos review her arrhythmia had previously been labeled atrial fibrillation though no clear evidence of that based on prior records.  She is recommended for baby aspirin daily.  Additional testing was deferred at that time.  She wore an event monitor April 2018 in July 2018 noting atrial runs, frequent PACs, occasional PVC.  Exercise Myoview April 2018 was low risk study without evidence of ischemia or scar.  She had echocardiogram May 2018 with LVEF 55--65%, normal wall motion, grade 1 diastolic dysfunction, mild LA enlargement.  When seen 09/14/2018 she was feeling well with continued occasional brief chest pressure which she correlated to her cervical spine problems which have been unchanged for years.  She reports feeling overall well.  She is very excited as she has a great grandchild being born today.  This will  be her 14th great-grandchild.  She reports continued intermittent twinges in her chest that are unchanged compared to previous.  She reports no shortness of breath at rest nor dyspnea on exertion.  Endorses eating a heart healthy diet.  She does try to stay active around the home but no formal exercise routine.  She denies palpitations, lightheadedness, dizziness, fatigue.  EKGs/Labs/Other Studies Reviewed:   The following studies were reviewed today:  EP Procedures and Devices:  Event monitor (10/07/16): Predominantly sinus rhythm with frequent PACs and occasional PVCs. Single episode of NSVT. Multiple atrial runs lasting up to 19.4 seconds. No sustained arrhythmias.  Event monitor (07/07/16): Predominantly sinus rhythm with 20 episodes of narrow complex tachycardia and irregularity consistent with atrial fibrillation (longest episode noted approximately 12 seconds). Frequent PACs and rare PVCs were noted.   Non-Invasive Evaluation(s):  TTE (08/06/16): Normal LV size with LVEF of 55-65%. Normal wall motion with grade 1 diastolic dysfunction. Mild left atrial enlargement. Normal RV size and function.  Exercise MPI (07/13/16): Low risk study without ischemia or scar. LVEF greater than 65%. Decreased exercise capacity, exercising 4 minutes, zero seconds, achieving 117 bpm (90% MPHR; 4.6 METs).   EKG:  EKG is  ordered today.  The ekg ordered today demonstrates sinus bradycardia 56 bpm with occasional PVC-noted short PR of 90.  Recent Labs: 10/04/2018: TSH 0.88 04/26/2019: ALT 17; BUN 14; Creatinine, Ser 0.87; Potassium 4.1; Sodium 137 05/29/2019:  Hemoglobin 13.4; Platelets 201.0  Recent Lipid Panel    Component Value Date/Time   CHOL 177 04/26/2019 0801   TRIG 73.0 04/26/2019 0801   HDL 93.60 04/26/2019 0801   CHOLHDL 2 04/26/2019 0801   VLDL 14.6 04/26/2019 0801   LDLCALC 69 04/26/2019 0801    Home Medications   Current Meds  Medication Sig  . benazepril (LOTENSIN) 40 MG tablet Take 1  tablet (40 mg total) by mouth 2 (two) times daily.  . Calcium Carbonate-Vitamin D (CALCIUM 600/VITAMIN D) 600-400 MG-UNIT per tablet Take 1 tablet by mouth daily.  . felodipine (PLENDIL) 2.5 MG 24 hr tablet Take 1 tablet (2.5 mg total) by mouth daily.  Marland Kitchen FLUAD QUADRIVALENT 0.5 ML injection   . furosemide (LASIX) 20 MG tablet Take 1 tablet (20 mg total) by mouth daily.  . metoprolol succinate (TOPROL-XL) 25 MG 24 hr tablet Take 0.5 tablets (12.5 mg total) by mouth daily.  . Multiple Vitamin (MULTIVITAMIN) tablet Take 1 tablet by mouth daily.  . Omega-3 Fatty Acids (FISH OIL) 1000 MG CAPS Take by mouth daily.  . rosuvastatin (CRESTOR) 5 MG tablet Take 1 tablet (5 mg total) by mouth daily.  . [DISCONTINUED] metoprolol succinate (TOPROL-XL) 25 MG 24 hr tablet Take 0.5 tablets (12.5 mg total) by mouth daily.      Review of Systems    Review of Systems  Constitutional: Negative for chills, fever and malaise/fatigue.  Cardiovascular: Negative for chest pain, dyspnea on exertion, leg swelling, near-syncope, orthopnea, palpitations and syncope.  Respiratory: Negative for cough, shortness of breath and wheezing.   Gastrointestinal: Negative for nausea and vomiting.  Neurological: Negative for dizziness, light-headedness and weakness.   All other systems reviewed and are otherwise negative except as noted above.  Physical Exam    VS:  BP 140/70 (BP Location: Left Arm, [name] Position: Sitting, Cuff Size: Normal)   Pulse (!) 56   Ht 5' (1.524 m)   Wt 130 lb (59 kg)   SpO2 98%   BMI 25.39 kg/m  , BMI Body mass index is 25.39 kg/m. GEN: Well nourished, well developed, in no acute distress. HEENT: normal. Neck: Supple, no JVD, carotid bruits, or masses. Cardiac: RRR, no murmurs, rubs, or gallops. No clubbing, cyanosis, edema.  Radials/DP/PT 2+ and equal bilaterally.  Respiratory:  Respirations regular and unlabored, clear to auscultation bilaterally. GI: Soft, nontender, nondistended, BS + x  4. MS: No deformity or atrophy. Skin: Warm and dry, no rash. Neuro:  Strength and sensation are intact. Psych: Normal affect.   Assessment & Plan    1. Atrial arrhythmia/bradycardia-EKG today sinus bradycardia 56 bpm with occasional PVC and short PR of 90 ms.  She denies palpitations, lightheadedness, dizziness.  As her bradycardia is asymptomatic and her metoprolol is low dose and controlling her palpitations, continue metoprolol 12.5 mg twice daily.  Refill provided today. 2. HTN -reports BP well controlled at home.  Continue present antihypertensive regimen per PCP. 3. HLD -continue Crestor 5 mg daily per PCP. 4. Atypical chest pain -reports complete to need short episodes of chest pain that are stable at baseline.  Previous cardiac work-up unrevealing.  As her symptoms are atypical, no indication for ischemic evaluation.  Disposition: Follow up in 1 year(s) with Dr. Benjaman Pott, NP 09/15/2019, 12:55 PM

## 2019-09-15 NOTE — Patient Instructions (Signed)
Medication Instructions:  No medication changes today.  We sent a refill of your Metoprolol to your pharmacy.   *If you need a refill on your cardiac medications before your next appointment, please call your pharmacy*   Lab Work: No lab work today.   Testing/Procedures: Your EKG today showed sinus bradycardia (a slow, but regular heart beat) with an occasional PVC (premature ventricular contraction - aka an early beat in the bottom chambers of your heart). This is a stable finding.    Follow-Up: At [facility], you and your health needs are our priority.  As part of our continuing mission to provide you with exceptional heart care, we have created designated Provider Care Teams.  These Care Teams include your primary Cardiologist (physician) and Advanced Practice Providers (APPs -  Physician Assistants and Nurse Practitioners) who all work together to provide you with the care you need, when you need it.  We recommend signing up for the patient portal called "MyChart".  Sign up information is provided on this After Visit Summary.  MyChart is used to connect with patients for Virtual Visits (Telemedicine).  Patients are able to view lab/test results, encounter notes, upcoming appointments, etc.  Non-urgent messages can be sent to your provider as well.   To learn more about what you can do with MyChart, go to [email or web address].    Your next appointment:   1 year(s)  The format for your next appointment:   In Person  Provider:    You may see Nelva Bush, MD or one of the following Advanced Practice Providers on your designated Care Team:    Murray Hodgkins, NP  Christell Faith, PA-C  Marrianne Mood, PA-C  Laurann Montana, NP

## 2019-10-17 ENCOUNTER — Other Ambulatory Visit: Payer: Self-pay | Admitting: Internal Medicine

## 2019-11-01 ENCOUNTER — Telehealth: Payer: Self-pay | Admitting: *Deleted

## 2019-11-01 DIAGNOSIS — I10 Essential (primary) hypertension: Secondary | ICD-10-CM

## 2019-11-01 DIAGNOSIS — R739 Hyperglycemia, unspecified: Secondary | ICD-10-CM

## 2019-11-01 DIAGNOSIS — E785 Hyperlipidemia, unspecified: Secondary | ICD-10-CM

## 2019-11-01 NOTE — Telephone Encounter (Signed)
Please place future orders for lab appt.  

## 2019-11-02 NOTE — Telephone Encounter (Signed)
Orders placed for f/u labs.  

## 2019-11-07 ENCOUNTER — Other Ambulatory Visit: Payer: Self-pay

## 2019-11-07 ENCOUNTER — Other Ambulatory Visit (INDEPENDENT_AMBULATORY_CARE_PROVIDER_SITE_OTHER): Payer: PPO

## 2019-11-07 DIAGNOSIS — I10 Essential (primary) hypertension: Secondary | ICD-10-CM

## 2019-11-07 DIAGNOSIS — E785 Hyperlipidemia, unspecified: Secondary | ICD-10-CM | POA: Diagnosis not present

## 2019-11-07 DIAGNOSIS — R739 Hyperglycemia, unspecified: Secondary | ICD-10-CM | POA: Diagnosis not present

## 2019-11-07 LAB — BASIC METABOLIC PANEL
BUN: 16 mg/dL (ref 6–23)
CO2: 27 mEq/L (ref 19–32)
Calcium: 10.2 mg/dL (ref 8.4–10.5)
Chloride: 102 mEq/L (ref 96–112)
Creatinine, Ser: 0.85 mg/dL (ref 0.40–1.20)
GFR: 63.17 mL/min (ref >60.00)
Glucose, Bld: 94 mg/dL (ref 70–99)
Potassium: 4.1 mEq/L (ref 3.5–5.1)
Sodium: 138 mEq/L (ref 135–145)

## 2019-11-07 LAB — LIPID PANEL
Cholesterol: 159 mg/dL (ref 0–200)
HDL: 74.5 mg/dL (ref >39.00)
LDL Cholesterol: 58 mg/dL (ref 0–99)
NonHDL: 84.25
Total CHOL/HDL Ratio: 2
Triglycerides: 129 mg/dL (ref 0.0–149.0)
VLDL: 25.8 mg/dL (ref 0.0–40.0)

## 2019-11-07 LAB — HEMOGLOBIN A1C: Hgb A1c MFr Bld: 5.5 % (ref 4.6–6.5)

## 2019-11-07 LAB — HEPATIC FUNCTION PANEL
ALT: 14 U/L (ref 0–35)
AST: 18 U/L (ref 0–37)
Albumin: 4.2 g/dL (ref 3.5–5.2)
Alkaline Phosphatase: 52 U/L (ref 39–117)
Bilirubin, Direct: 0.1 mg/dL (ref 0.0–0.3)
Total Bilirubin: 0.8 mg/dL (ref 0.2–1.2)
Total Protein: 6.3 g/dL (ref 6.0–8.3)

## 2019-11-07 LAB — TSH: TSH: 1.07 u[IU]/mL (ref 0.35–4.50)

## 2019-11-09 ENCOUNTER — Telehealth (INDEPENDENT_AMBULATORY_CARE_PROVIDER_SITE_OTHER): Payer: PPO | Admitting: Internal Medicine

## 2019-11-09 DIAGNOSIS — F439 Reaction to severe stress, unspecified: Secondary | ICD-10-CM | POA: Diagnosis not present

## 2019-11-09 DIAGNOSIS — D72819 Decreased white blood cell count, unspecified: Secondary | ICD-10-CM | POA: Diagnosis not present

## 2019-11-09 DIAGNOSIS — R739 Hyperglycemia, unspecified: Secondary | ICD-10-CM

## 2019-11-09 DIAGNOSIS — E785 Hyperlipidemia, unspecified: Secondary | ICD-10-CM

## 2019-11-09 DIAGNOSIS — I471 Supraventricular tachycardia: Secondary | ICD-10-CM | POA: Diagnosis not present

## 2019-11-09 DIAGNOSIS — Z85820 Personal history of malignant melanoma of skin: Secondary | ICD-10-CM

## 2019-11-09 DIAGNOSIS — I10 Essential (primary) hypertension: Secondary | ICD-10-CM | POA: Diagnosis not present

## 2019-11-09 DIAGNOSIS — M65331 Trigger finger, right middle finger: Secondary | ICD-10-CM | POA: Diagnosis not present

## 2019-11-09 MED ORDER — FELODIPINE ER 2.5 MG PO TB24
2.5000 mg | ORAL_TABLET | Freq: Every day | ORAL | 1 refills | Status: DC
Start: 2019-11-09 — End: 2020-05-16

## 2019-11-09 MED ORDER — ROSUVASTATIN CALCIUM 5 MG PO TABS
5.0000 mg | ORAL_TABLET | Freq: Every day | ORAL | 1 refills | Status: DC
Start: 2019-11-09 — End: 2020-05-16

## 2019-11-09 MED ORDER — BENAZEPRIL HCL 40 MG PO TABS
40.0000 mg | ORAL_TABLET | Freq: Two times a day (BID) | ORAL | 1 refills | Status: DC
Start: 2019-11-09 — End: 2020-05-14

## 2019-11-09 NOTE — Progress Notes (Signed)
Patient ID: Erin [name], female   DOB: 1930-05-29, 86 y.o.   MRN: [id number]   Virtual Visit via telephone Note  This visit type was conducted due to national recommendations for restrictions regarding the COVID-19 pandemic (e.g. social distancing).  This format is felt to be most appropriate for this patient at this time.  All issues noted in this document were discussed and addressed.  No physical exam was performed (except for noted visual exam findings with Video Visits).   I connected with Arturo Morton by telephone and verified that I am speaking with the correct person using two identifiers. Location patient: home Location provider: work  Persons participating in the telephone visit: patient, provider  The limitations, risks, security and privacy concerns of performing an evaluation and management service by telephone and the availability of in person appointments have been discussed.  It has also been discussed with the patient that there may be a patient responsible charge related to this service. The patient expressed understanding and agreed to proceed.   Reason for visit: scheduled follow up.   HPI: She reports she is doing relatively well.  Stays active.  No chest pain or sob reported.  No cough or congestion.  Had covid vaccine.  No abdominal pain or bowel change reported.  Due to see Dr Jefm Bryant this pm - evaluation right trigger finger.  Saw cardiology 09/2019.  Stable.  She is walking.  Uses her cane prn.  No significant unsteadiness.     ROS: See pertinent positives and negatives per HPI.  Past Medical History:  Diagnosis Date  . Chest pain 07/13/2016  . History of colon polyps   . Hypercholesterolemia   . Hypertension     Past Surgical History:  Procedure Laterality Date  . APPENDECTOMY  1946  . CATARACT EXTRACTION Bilateral   . DILATION AND CURETTAGE OF UTERUS    . LAMINECTOMY  2000  . release of foraminal stenosis  2003    Family History  Problem Relation Age  of Onset  . Breast cancer Maternal Aunt   . Hypertension Mother   . Hypertension Sister   . Heart disease Brother        S/P CABG  . Multiple sclerosis Sister        died 36    SOCIAL HX: reviewed.    Current Outpatient Medications:  .  benazepril (LOTENSIN) 40 MG tablet, Take 1 tablet (40 mg total) by mouth 2 (two) times daily., Disp: 180 tablet, Rfl: 1 .  Calcium Carbonate-Vitamin D (CALCIUM 600/VITAMIN D) 600-400 MG-UNIT per tablet, Take 1 tablet by mouth daily., Disp: , Rfl:  .  felodipine (PLENDIL) 2.5 MG 24 hr tablet, Take 1 tablet (2.5 mg total) by mouth daily., Disp: 90 tablet, Rfl: 1 .  FLUAD QUADRIVALENT 0.5 ML injection, , Disp: , Rfl:  .  furosemide (LASIX) 20 MG tablet, Take 1 tablet (20 mg total) by mouth daily., Disp: 30 tablet, Rfl: 0 .  metoprolol succinate (TOPROL-XL) 25 MG 24 hr tablet, Take 0.5 tablets (12.5 mg total) by mouth daily., Disp: 45 tablet, Rfl: 3 .  Multiple Vitamin (MULTIVITAMIN) tablet, Take 1 tablet by mouth daily., Disp: , Rfl:  .  Omega-3 Fatty Acids (FISH OIL) 1000 MG CAPS, Take by mouth daily., Disp: , Rfl:  .  rosuvastatin (CRESTOR) 5 MG tablet, Take 1 tablet (5 mg total) by mouth daily., Disp: 90 tablet, Rfl: 1  EXAM:  GENERAL: alert. Sounds to be in no acute distress.  Answering  questions appropriately.    PSYCH/NEURO: pleasant and cooperative, no obvious depression or anxiety, speech and thought processing grossly intact  ASSESSMENT AND PLAN:  Discussed the following assessment and plan:  Stress Overall appears to e handling things well.  Follow.    Narrow complex tachycardia ([place]) Has seen cardiology. Found on monitor.  On toprol.  Doing well.  Follow.   Leukopenia Follow cbc.   Hypertension Blood pressure doing well on felodipine, lasix prn, lotensin and metoprolol.  Follow pressures.  Follow metabolic panel.   Hyperlipidemia On crestor.  Low cholesterol diet and exercise.  Follow  Lipid panel and liver function tests.     Hyperglycemia Low carb diet and exercise.  Follow met b and a1c.   History of melanoma Followed by dermatology.     Orders Placed This Encounter  Procedures  . Hemoglobin A1c    Standing Status:   Future    Standing Expiration Date:   11/18/2020  . Hepatic function panel    Standing Status:   Future    Standing Expiration Date:   11/18/2020  . Lipid panel    Standing Status:   Future    Standing Expiration Date:   11/18/2020  . Basic metabolic panel    Standing Status:   Future    Standing Expiration Date:   11/18/2020    Meds ordered this encounter  Medications  . benazepril (LOTENSIN) 40 MG tablet    Sig: Take 1 tablet (40 mg total) by mouth 2 (two) times daily.    Dispense:  180 tablet    Refill:  1  . felodipine (PLENDIL) 2.5 MG 24 hr tablet    Sig: Take 1 tablet (2.5 mg total) by mouth daily.    Dispense:  90 tablet    Refill:  1  . rosuvastatin (CRESTOR) 5 MG tablet    Sig: Take 1 tablet (5 mg total) by mouth daily.    Dispense:  90 tablet    Refill:  1     I discussed the assessment and treatment plan with the patient. The patient was provided an opportunity to ask questions and all were answered. The patient agreed with the plan and demonstrated an understanding of the instructions.   The patient was advised to call back or seek an in-person evaluation if the symptoms worsen or if the condition fails to improve as anticipated.  I provided 23 minutes of non-face-to-face time during this encounter.   Einar Pheasant, MD

## 2019-11-19 ENCOUNTER — Encounter: Payer: Self-pay | Admitting: Internal Medicine

## 2019-11-19 NOTE — Assessment & Plan Note (Signed)
Has seen cardiology.  Found on monitor. On toprol.  Doing well.  Follow.  

## 2019-11-19 NOTE — Assessment & Plan Note (Signed)
Follow cbc.  

## 2019-11-19 NOTE — Assessment & Plan Note (Signed)
Blood pressure doing well on felodipine, lasix prn, lotensin and metoprolol.  Follow pressures.  Follow metabolic panel.

## 2019-11-19 NOTE — Assessment & Plan Note (Signed)
Low carb diet and exercise.  Follow met b and a1c.  

## 2019-11-19 NOTE — Assessment & Plan Note (Signed)
Overall appears to e handling things well.  Follow.

## 2019-11-19 NOTE — Assessment & Plan Note (Signed)
Followed by dermatology

## 2019-11-19 NOTE — Assessment & Plan Note (Signed)
On crestor.  Low cholesterol diet and exercise.  Follow  Lipid panel and liver function tests.   

## 2019-12-06 DIAGNOSIS — Z961 Presence of intraocular lens: Secondary | ICD-10-CM | POA: Diagnosis not present

## 2020-01-24 DIAGNOSIS — Z85828 Personal history of other malignant neoplasm of skin: Secondary | ICD-10-CM | POA: Diagnosis not present

## 2020-01-24 DIAGNOSIS — D225 Melanocytic nevi of trunk: Secondary | ICD-10-CM | POA: Diagnosis not present

## 2020-01-24 DIAGNOSIS — Z85820 Personal history of malignant melanoma of skin: Secondary | ICD-10-CM | POA: Diagnosis not present

## 2020-01-24 DIAGNOSIS — L821 Other seborrheic keratosis: Secondary | ICD-10-CM | POA: Diagnosis not present

## 2020-01-24 DIAGNOSIS — D2262 Melanocytic nevi of left upper limb, including shoulder: Secondary | ICD-10-CM | POA: Diagnosis not present

## 2020-01-24 DIAGNOSIS — X32XXXA Exposure to sunlight, initial encounter: Secondary | ICD-10-CM | POA: Diagnosis not present

## 2020-01-24 DIAGNOSIS — L570 Actinic keratosis: Secondary | ICD-10-CM | POA: Diagnosis not present

## 2020-01-24 DIAGNOSIS — D2272 Melanocytic nevi of left lower limb, including hip: Secondary | ICD-10-CM | POA: Diagnosis not present

## 2020-02-22 DIAGNOSIS — H6123 Impacted cerumen, bilateral: Secondary | ICD-10-CM | POA: Diagnosis not present

## 2020-02-22 DIAGNOSIS — L723 Sebaceous cyst: Secondary | ICD-10-CM | POA: Diagnosis not present

## 2020-04-30 ENCOUNTER — Ambulatory Visit (INDEPENDENT_AMBULATORY_CARE_PROVIDER_SITE_OTHER): Payer: PPO

## 2020-04-30 VITALS — BP 118/62 | HR 55 | Temp 97.4°F | Ht 60.0 in | Wt 128.0 lb

## 2020-04-30 DIAGNOSIS — Z0000 Encounter for general adult medical examination without abnormal findings: Secondary | ICD-10-CM

## 2020-04-30 NOTE — Patient Instructions (Addendum)
Erin [name] , Thank you for taking time to come for your Medicare Wellness Visit. I appreciate your ongoing commitment to your health goals. Please review the following plan we discussed and let me know if I can assist you in the future.   These are the goals we discussed: Goals      Patient Stated   .  I want to start playing "Bridge" game again (pt-stated)      Brain stimulating activities       This is a list of the screening recommended for you and due dates:  Health Maintenance  Topic Date Due  . Flu Shot  06/13/2020*  . Tetanus Vaccine  06/01/2022  . DEXA scan (bone density measurement)  Completed  . COVID-19 Vaccine  Completed  . Pneumonia vaccines  Completed  . Mammogram  Discontinued  *Topic was postponed. The date shown is not the original due date.    Immunizations Immunization History  Administered Date(s) Administered  . Fluad Quad(high Dose 65+) 11/25/2018  . Influenza Split 12/05/2013  . Influenza, High Dose Seasonal PF 11/24/2016, 12/02/2017  . Influenza,inj,Quad PF,6+ Mos 11/30/2012  . Influenza-Unspecified 11/14/2013, 12/11/2014, 12/06/2015  . PFIZER(Purple Top)SARS-COV-2 Vaccination 04/06/2019, 04/27/2019  . Pneumococcal Conjugate-13 10/24/2014  . Pneumococcal Polysaccharide-23 11/13/2015  . Tdap 05/31/2012  . Zoster 02/14/2011  . Zoster Recombinat (Shingrix) 12/23/2016, 03/26/2017   Keep all routine maintenance appointments.   Next scheduled lab 05/13/20 @ 8:00  Follow up 05/16/20 @ 9:00  Advanced directives: on file  Conditions/risks identified: none new  Follow up in one year for your annual wellness visit   Preventive Care 65 Years and Older, Female Preventive care refers to lifestyle choices and visits with your health care provider that can promote health and wellness. What does preventive care include?  A yearly physical exam. This is also called an annual well check.  Dental exams once or twice a year.  Routine eye exams. Ask your  health care provider how often you should have your eyes checked.  Personal lifestyle choices, including:  Daily care of your teeth and gums.  Regular physical activity.  Eating a healthy diet.  Avoiding tobacco and drug use.  Limiting alcohol use.  Practicing safe sex.  Taking low-dose aspirin every day.  Taking vitamin and mineral supplements as recommended by your health care provider. What happens during an annual well check? The services and screenings done by your health care provider during your annual well check will depend on your age, overall health, lifestyle risk factors, and family history of disease. Counseling  Your health care provider may ask you questions about your:  Alcohol use.  Tobacco use.  Drug use.  Emotional well-being.  Home and relationship well-being.  Sexual activity.  Eating habits.  History of falls.  Memory and ability to understand (cognition).  Work and work Statistician.  Reproductive health. Screening  You may have the following tests or measurements:  Height, weight, and BMI.  Blood pressure.  Lipid and cholesterol levels. These may be checked every 5 years, or more frequently if you are over 56 years old.  Skin check.  Lung cancer screening. You may have this screening every year starting at age 11 if you have a 30-pack-year history of smoking and currently smoke or have quit within the past 15 years.  Fecal occult blood test (FOBT) of the stool. You may have this test every year starting at age 55.  Flexible sigmoidoscopy or colonoscopy. You may have a sigmoidoscopy every  5 years or a colonoscopy every 10 years starting at age 59.  Hepatitis C blood test.  Hepatitis B blood test.  Sexually transmitted disease (STD) testing.  Diabetes screening. This is done by checking your blood sugar (glucose) after you have not eaten for a while (fasting). You may have this done every 1-3 years.  Bone density scan. This is  done to screen for osteoporosis. You may have this done starting at age 110.  Mammogram. This may be done every 1-2 years. Talk to your health care provider about how often you should have regular mammograms. Talk with your health care provider about your test results, treatment options, and if necessary, the need for more tests. Vaccines  Your health care provider may recommend certain vaccines, such as:  Influenza vaccine. This is recommended every year.  Tetanus, diphtheria, and acellular pertussis (Tdap, Td) vaccine. You may need a Td booster every 10 years.  Zoster vaccine. You may need this after age 35.  Pneumococcal 13-valent conjugate (PCV13) vaccine. One dose is recommended after age 40.  Pneumococcal polysaccharide (PPSV23) vaccine. One dose is recommended after age 36. Talk to your health care provider about which screenings and vaccines you need and how often you need them. This information is not intended to replace advice given to you by your health care provider. Make sure you discuss any questions you have with your health care provider. Document Released: 03/29/2015 Document Revised: 11/20/2015 Document Reviewed: 01/01/2015 Elsevier Interactive Patient Education  2017 [place] Prevention in the Home Falls can cause injuries. They can happen to people of all ages. There are many things you can do to make your home safe and to help prevent falls. What can I do on the outside of my home?  Regularly fix the edges of walkways and driveways and fix any cracks.  Remove anything that might make you trip as you walk through a door, such as a raised step or threshold.  Trim any bushes or trees on the path to your home.  Use bright outdoor lighting.  Clear any walking paths of anything that might make someone trip, such as rocks or tools.  Regularly check to see if handrails are loose or broken. Make sure that both sides of any steps have handrails.  Any raised  decks and porches should have guardrails on the edges.  Have any leaves, snow, or ice cleared regularly.  Use sand or salt on walking paths during winter.  Clean up any spills in your garage right away. This includes oil or grease spills. What can I do in the bathroom?  Use night lights.  Install grab bars by the toilet and in the tub and shower. Do not use towel bars as grab bars.  Use non-skid mats or decals in the tub or shower.  If you need to sit down in the shower, use a plastic, non-slip stool.  Keep the floor dry. Clean up any water that spills on the floor as soon as it happens.  Remove soap buildup in the tub or shower regularly.  Attach bath mats securely with double-sided non-slip rug tape.  Do not have throw rugs and other things on the floor that can make you trip. What can I do in the bedroom?  Use night lights.  Make sure that you have a light by your bed that is easy to reach.  Do not use any sheets or blankets that are too big for your bed. They should not  hang down onto the floor.  Have a firm chair that has side arms. You can use this for support while you get dressed.  Do not have throw rugs and other things on the floor that can make you trip. What can I do in the kitchen?  Clean up any spills right away.  Avoid walking on wet floors.  Keep items that you use a lot in easy-to-reach places.  If you need to reach something above you, use a strong step stool that has a grab bar.  Keep electrical cords out of the way.  Do not use floor polish or wax that makes floors slippery. If you must use wax, use non-skid floor wax.  Do not have throw rugs and other things on the floor that can make you trip. What can I do with my stairs?  Do not leave any items on the stairs.  Make sure that there are handrails on both sides of the stairs and use them. Fix handrails that are broken or loose. Make sure that handrails are as long as the stairways.  Check  any carpeting to make sure that it is firmly attached to the stairs. Fix any carpet that is loose or worn.  Avoid having throw rugs at the top or bottom of the stairs. If you do have throw rugs, attach them to the floor with carpet tape.  Make sure that you have a light switch at the top of the stairs and the bottom of the stairs. If you do not have them, ask someone to add them for you. What else can I do to help prevent falls?  Wear shoes that:  Do not have high heels.  Have rubber bottoms.  Are comfortable and fit you well.  Are closed at the toe. Do not wear sandals.  If you use a stepladder:  Make sure that it is fully opened. Do not climb a closed stepladder.  Make sure that both sides of the stepladder are locked into place.  Ask someone to hold it for you, if possible.  Clearly mark and make sure that you can see:  Any grab bars or handrails.  First and last steps.  Where the edge of each step is.  Use tools that help you move around (mobility aids) if they are needed. These include:  Canes.  Walkers.  Scooters.  Crutches.  Turn on the lights when you go into a dark area. Replace any light bulbs as soon as they burn out.  Set up your furniture so you have a clear path. Avoid moving your furniture around.  If any of your floors are uneven, fix them.  If there are any pets around you, be aware of where they are.  Review your medicines with your doctor. Some medicines can make you feel dizzy. This can increase your chance of falling. Ask your doctor what other things that you can do to help prevent falls. This information is not intended to replace advice given to you by your health care provider. Make sure you discuss any questions you have with your health care provider. Document Released: 12/27/2008 Document Revised: 08/08/2015 Document Reviewed: 04/06/2014 Elsevier Interactive Patient Education  2017 [employer].

## 2020-04-30 NOTE — Progress Notes (Signed)
Subjective:   Erin [name] is a 87 y.o. female who presents for Medicare Annual (Subsequent) preventive examination.  Review of Systems    No ROS.  Medicare Wellness Virtual Visit.   Cardiac Risk Factors include: advanced age (>61men, >38 women)     Objective:    Today's Vitals   04/30/20 0936  BP: 118/62  Pulse: (!) 55  Temp: (!) 97.4 F (36.3 C)  TempSrc: Oral  Weight: 128 lb (58.1 kg)  Height: 5' (1.524 m)   Body mass index is 25 kg/m.  Advanced Directives 04/30/2020 04/28/2019 04/26/2018 04/23/2017 06/11/2016 04/22/2016 11/13/2015  Does Patient Have a Medical Advance Directive? Yes Yes Yes Yes Yes Yes Yes  Type of Paramedic of [place];Living will [place];Living will [place];Living will [place];Living will [place];Living will [place];Living will Living will;Healthcare Power of Attorney  Does patient want to make changes to medical advance directive? No - Patient declined No - Patient declined No - Patient declined No - Patient declined - No - Patient declined -  Copy of [place] in Chart? Yes - validated most recent copy scanned in chart (See row information) Yes - validated most recent copy scanned in chart (See row information) Yes - validated most recent copy scanned in chart (See row information) Yes No - copy requested No - copy requested -    Current Medications (verified) Outpatient Encounter Medications as of 04/30/2020  Medication Sig  . benazepril (LOTENSIN) 40 MG tablet Take 1 tablet (40 mg total) by mouth 2 (two) times daily.  . Calcium Carbonate-Vitamin D (CALCIUM 600/VITAMIN D) 600-400 MG-UNIT per tablet Take 1 tablet by mouth daily.  . felodipine (PLENDIL) 2.5 MG 24 hr tablet Take 1 tablet (2.5 mg total) by mouth daily.  Marland Kitchen FLUAD QUADRIVALENT 0.5 ML injection   . furosemide (LASIX) 20 MG tablet Take 1 tablet (20 mg  total) by mouth daily.  . metoprolol succinate (TOPROL-XL) 25 MG 24 hr tablet Take 0.5 tablets (12.5 mg total) by mouth daily.  . Multiple Vitamin (MULTIVITAMIN) tablet Take 1 tablet by mouth daily.  . Omega-3 Fatty Acids (FISH OIL) 1000 MG CAPS Take by mouth daily.  . rosuvastatin (CRESTOR) 5 MG tablet Take 1 tablet (5 mg total) by mouth daily.   No facility-administered encounter medications on file as of 04/30/2020.    Allergies (verified) Erythromycin and Minocin [minocycline hcl]   History: Past Medical History:  Diagnosis Date  . Chest pain 07/13/2016  . History of colon polyps   . Hypercholesterolemia   . Hypertension    Past Surgical History:  Procedure Laterality Date  . APPENDECTOMY  1946  . CATARACT EXTRACTION Bilateral   . DILATION AND CURETTAGE OF UTERUS    . LAMINECTOMY  2000  . release of foraminal stenosis  2003   Family History  Problem Relation Age of Onset  . Breast cancer Maternal Aunt   . Hypertension Mother   . Hypertension Sister   . Heart disease Brother        S/P CABG  . Multiple sclerosis Sister        died 44   Social History   Socioeconomic History  . Marital status: Widowed    Spouse name: Not on file  . Number of children: 4  . Years of education: Not on file  . Highest education level: Not on file  Occupational History  . Occupation:  retired  Tobacco Use  . Smoking status: Former Research scientist (life sciences)  . Smokeless tobacco: Never Used  Vaping Use  . Vaping Use: Never used  Substance and Sexual Activity  . Alcohol use: Yes    Alcohol/week: 5.0 - 6.0 standard drinks    Types: 5 - 6 Glasses of wine per week    Comment: rare  . Drug use: No  . Sexual activity: Never  Other Topics Concern  . Not on file  Social History Narrative  . Not on file   Social Determinants of Health   Financial Resource Strain: Low Risk   . Difficulty of Paying Living Expenses: Not hard at all  Food Insecurity: No Food Insecurity  . Worried About Ship broker in the Last Year: Never true  . Ran Out of Food in the Last Year: Never true  Transportation Needs: No Transportation Needs  . Lack of Transportation (Medical): No  . Lack of Transportation (Non-Medical): No  Physical Activity: Sufficiently Active  . Days of Exercise per Week: 5 days  . Minutes of Exercise per Session: 30 min  Stress: No Stress Concern Present  . Feeling of Stress : Not at all  Social Connections: Unknown  . Frequency of Communication with Friends and Family: More than three times a week  . Frequency of Social Gatherings with Friends and Family: Twice a week  . Attends Religious Services: More than 4 times per year  . Active Member of Clubs or Organizations: Yes  . Attends Archivist Meetings: Not on file  . Marital Status: Not on file    Tobacco Counseling Counseling given: Not Answered   Clinical Intake:  Pre-visit preparation completed: Yes        Diabetes: No  How often do you need to have someone help you when you read instructions, pamphlets, or other written materials from your doctor or pharmacy?: 1 - Never    Interpreter Needed?: No      Activities of Daily Living In your present state of health, do you have any difficulty performing the following activities: 04/30/2020  Hearing? Y  Comment Hearing aids  Vision? N  Difficulty concentrating or making decisions? N  Walking or climbing stairs? N  Dressing or bathing? N  Doing errands, shopping? N  Preparing Food and eating ? N  Using the Toilet? N  In the past six months, have you accidently leaked urine? N  Do you have problems with loss of bowel control? N  Managing your Medications? N  Managing your Finances? N  Housekeeping or managing your Housekeeping? N  Some recent data might be hidden    Patient Care Team: Einar Pheasant, MD as PCP - General (Internal Medicine) End, Harrell Gave, MD as PCP - Cardiology (Cardiology)  Indicate any recent Medical Services  you may have received from other than [facility] providers in the past year (date may be approximate).     Assessment:   This is a routine wellness examination for Erin [name].  I connected with Erin [name] today by telephone and verified that I am speaking with the correct person using two identifiers. Location patient: home Location provider: work Persons participating in the virtual visit: patient, Marine scientist.    I discussed the limitations, risks, security and privacy concerns of performing an evaluation and management service by telephone and the availability of in person appointments. The patient expressed understanding and verbally consented to this telephonic visit.    Interactive audio and video telecommunications were attempted between this  provider and patient, however failed, due to patient having technical difficulties OR patient did not have access to video capability.  We continued and completed visit with audio only.  Some vital signs may be absent or patient reported.   Hearing/Vision screen  Hearing Screening   125Hz  250Hz  500Hz  1000Hz  2000Hz  3000Hz  4000Hz  6000Hz  8000Hz   Right ear:           Left ear:           Comments: Hearing aids  Vision Screening Comments: Wears corrective lenses Visual acuity not assessed, virtual visit.  They have seen their ophthalmologist in the last 12 months.   Dietary issues and exercise activities discussed: Current Exercise Habits: Home exercise routine, Type of exercise: walking, Intensity: Mild  Goals      Patient Stated   .  I want to start playing "Bridge" game again (pt-stated)      Brain stimulating activities      Depression Screen PHQ 2/9 Scores 04/30/2020 04/28/2019 04/26/2018 04/23/2017 03/26/2017 04/22/2016 11/13/2015  PHQ - 2 Score 0 0 0 0 0 0 0    Fall Risk Fall Risk  04/30/2020 04/28/2019 04/26/2018 04/23/2017 03/26/2017  Falls in the past year? 0 0 0 No No  Number falls in past yr: 0 - - - -  Injury with Fall? 0 - - - -  Follow up Falls  evaluation completed Falls evaluation completed - - -    FALL RISK PREVENTION PERTAINING TO THE HOME: Handrails in use when climbing stairs? Yes Home free of loose throw rugs in walkways, pet beds, electrical cords, etc? Yes  Adequate lighting in your home to reduce risk of falls? Yes   ASSISTIVE DEVICES UTILIZED TO PREVENT FALLS: Life alert? Yes  Use of a cane, walker or w/c? Yes   TIMED UP AND GO: Was the test performed? No . Virtual visit.    Cognitive Function:  MMSE - Mini Mental State Exam 04/23/2015  Orientation to time 5  Orientation to Place 5  Registration 3  Attention/ Calculation 5  Recall 3  Language- name 2 objects 2  Language- repeat 1  Language- follow 3 step command 3  Language- read & follow direction 1  Write a sentence 1  Copy design 1  Total score 30     6CIT Screen 04/30/2020 04/28/2019 04/26/2018 04/23/2017 04/22/2016  What Year? 0 points 0 points 0 points 0 points 0 points  What month? 0 points 0 points 0 points 0 points 0 points  What time? 0 points 0 points 0 points 0 points 0 points  Count back from 20 0 points 0 points 0 points 0 points 0 points  Months in reverse 0 points 0 points 0 points 0 points 0 points  Repeat phrase - 0 points 0 points 0 points 0 points  Total Score - 0 0 0 0    Immunizations Immunization History  Administered Date(s) Administered  . Fluad Quad(high Dose 65+) 11/25/2018  . Influenza Split 12/05/2013  . Influenza, High Dose Seasonal PF 11/24/2016, 12/02/2017  . Influenza,inj,Quad PF,6+ Mos 11/30/2012  . Influenza-Unspecified 11/14/2013, 12/11/2014, 12/06/2015  . PFIZER(Purple Top)SARS-COV-2 Vaccination 04/06/2019, 04/27/2019, 01/04/2020  . Pneumococcal Conjugate-13 10/24/2014  . Pneumococcal Polysaccharide-23 11/13/2015  . Tdap 05/31/2012  . Zoster 02/14/2011  . Zoster Recombinat (Shingrix) 12/23/2016, 03/26/2017    Health Maintenance Health Maintenance  Topic Date Due  . INFLUENZA VACCINE  06/13/2020 (Originally  10/15/2019)  . TETANUS/TDAP  06/01/2022  . DEXA SCAN  Completed  .  COVID-19 Vaccine  Completed  . PNA vac Low Risk Adult  Completed  . MAMMOGRAM  Discontinued   Colorectal cancer screening: No longer required.   Lung Cancer Screening: (Low Dose CT Chest recommended if Age 17-80 years, 30 pack-year currently smoking OR have quit w/in 15years.) does not qualify.    Hepatitis C Screening: does not qualify.  Vision Screening: Recommended annual ophthalmology exams for early detection of glaucoma and other disorders of the eye. Is the patient up to date with their annual eye exam?  Yes   Dental Screening: Recommended annual dental exams for proper oral hygiene.  Community Resource Referral / Chronic Care Management: CRR required this visit?  No   CCM required this visit?  No      Plan:   Keep all routine maintenance appointments.   Next scheduled lab 05/13/20 @ 8:00  Follow up 05/16/20 @ 9:00  I have personally reviewed and noted the following in the patient's chart:   . Medical and social history . Use of alcohol, tobacco or illicit drugs  . Current medications and supplements . Functional ability and status . Nutritional status . Physical activity . Advanced directives . List of other physicians . Hospitalizations, surgeries, and ER visits in previous 12 months . Vitals . Screenings to include cognitive, depression, and falls . Referrals and appointments  In addition, I have reviewed and discussed with patient certain preventive protocols, quality metrics, and best practice recommendations. A written personalized care plan for preventive services as well as general preventive health recommendations were provided to patient via mail.     Varney Biles, LPN   2/82/0601

## 2020-05-13 ENCOUNTER — Other Ambulatory Visit (INDEPENDENT_AMBULATORY_CARE_PROVIDER_SITE_OTHER): Payer: PPO

## 2020-05-13 ENCOUNTER — Other Ambulatory Visit: Payer: Self-pay

## 2020-05-13 DIAGNOSIS — R739 Hyperglycemia, unspecified: Secondary | ICD-10-CM

## 2020-05-13 DIAGNOSIS — I10 Essential (primary) hypertension: Secondary | ICD-10-CM | POA: Diagnosis not present

## 2020-05-13 DIAGNOSIS — E785 Hyperlipidemia, unspecified: Secondary | ICD-10-CM | POA: Diagnosis not present

## 2020-05-13 LAB — HEPATIC FUNCTION PANEL
ALT: 18 U/L (ref 0–35)
AST: 20 U/L (ref 0–37)
Albumin: 4.2 g/dL (ref 3.5–5.2)
Alkaline Phosphatase: 51 U/L (ref 39–117)
Bilirubin, Direct: 0.1 mg/dL (ref 0.0–0.3)
Total Bilirubin: 0.7 mg/dL (ref 0.2–1.2)
Total Protein: 6.7 g/dL (ref 6.0–8.3)

## 2020-05-13 LAB — LIPID PANEL
Cholesterol: 167 mg/dL (ref 0–200)
HDL: 88.8 mg/dL (ref >39.00)
LDL Cholesterol: 64 mg/dL (ref 0–99)
NonHDL: 78.68
Total CHOL/HDL Ratio: 2
Triglycerides: 73 mg/dL (ref 0.0–149.0)
VLDL: 14.6 mg/dL (ref 0.0–40.0)

## 2020-05-13 LAB — BASIC METABOLIC PANEL
BUN: 17 mg/dL (ref 6–23)
CO2: 29 mEq/L (ref 19–32)
Calcium: 10.4 mg/dL (ref 8.4–10.5)
Chloride: 103 mEq/L (ref 96–112)
Creatinine, Ser: 0.89 mg/dL (ref 0.40–1.20)
GFR: 58 mL/min — ABNORMAL LOW (ref >60.00)
Glucose, Bld: 98 mg/dL (ref 70–99)
Potassium: 4.1 mEq/L (ref 3.5–5.1)
Sodium: 138 mEq/L (ref 135–145)

## 2020-05-13 LAB — HEMOGLOBIN A1C: Hgb A1c MFr Bld: 5.6 % (ref 4.6–6.5)

## 2020-05-14 ENCOUNTER — Other Ambulatory Visit: Payer: Self-pay | Admitting: Internal Medicine

## 2020-05-16 ENCOUNTER — Other Ambulatory Visit: Payer: Self-pay

## 2020-05-16 ENCOUNTER — Encounter: Payer: Self-pay | Admitting: Internal Medicine

## 2020-05-16 ENCOUNTER — Ambulatory Visit (INDEPENDENT_AMBULATORY_CARE_PROVIDER_SITE_OTHER): Payer: PPO | Admitting: Internal Medicine

## 2020-05-16 VITALS — BP 142/72 | HR 97 | Temp 98.2°F | Ht 60.0 in | Wt 130.5 lb

## 2020-05-16 DIAGNOSIS — E785 Hyperlipidemia, unspecified: Secondary | ICD-10-CM | POA: Diagnosis not present

## 2020-05-16 DIAGNOSIS — Z0000 Encounter for general adult medical examination without abnormal findings: Secondary | ICD-10-CM | POA: Diagnosis not present

## 2020-05-16 DIAGNOSIS — R739 Hyperglycemia, unspecified: Secondary | ICD-10-CM

## 2020-05-16 DIAGNOSIS — F439 Reaction to severe stress, unspecified: Secondary | ICD-10-CM | POA: Diagnosis not present

## 2020-05-16 DIAGNOSIS — Z85820 Personal history of malignant melanoma of skin: Secondary | ICD-10-CM

## 2020-05-16 DIAGNOSIS — I10 Essential (primary) hypertension: Secondary | ICD-10-CM

## 2020-05-16 DIAGNOSIS — N63 Unspecified lump in unspecified breast: Secondary | ICD-10-CM

## 2020-05-16 DIAGNOSIS — I471 Supraventricular tachycardia: Secondary | ICD-10-CM

## 2020-05-16 MED ORDER — BENAZEPRIL HCL 40 MG PO TABS
40.0000 mg | ORAL_TABLET | Freq: Two times a day (BID) | ORAL | 1 refills | Status: DC
Start: 2020-05-16 — End: 2020-06-12

## 2020-05-16 MED ORDER — FUROSEMIDE 20 MG PO TABS: 20.0000 mg | ORAL_TABLET | Freq: Every day | ORAL | 1 refills | Status: DC | PRN

## 2020-05-16 MED ORDER — ROSUVASTATIN CALCIUM 5 MG PO TABS: 5.0000 mg | ORAL_TABLET | Freq: Every day | ORAL | 1 refills | Status: DC

## 2020-05-16 MED ORDER — FELODIPINE ER 2.5 MG PO TB24: 2.5000 mg | ORAL_TABLET | Freq: Every day | ORAL | 1 refills | Status: DC

## 2020-05-16 NOTE — Progress Notes (Signed)
Patient ID: Erin [name], female   DOB: Nov 26, 1930, 87 y.o.   MRN: [id number]   Subjective:    Patient ID: Erin [name], female    DOB: Mar 06, 1931, 87 y.o.   MRN: [id number]  HPI This visit occurred during the SARS-CoV-2 public health emergency.  Safety protocols were in place, including screening questions prior to the visit, additional usage of staff PPE, and extensive cleaning of exam room while observing appropriate contact time as indicated for disinfecting solutions.  Patient here for a physical exam.   She reports she is doing relatively well.  Handling stress.  Tries to stay active.  Walking.  No chest pain or sob reported.  No abdominal pain or bowel change reported.  Saw Dr Jefm Bryant - trigger finger - 11/09/19.  S/p injection.  Taking lasix 3-5x/week.  Stable.    Past Medical History:  Diagnosis Date  . Chest pain 07/13/2016  . History of colon polyps   . Hypercholesterolemia   . Hypertension    Past Surgical History:  Procedure Laterality Date  . APPENDECTOMY  1946  . CATARACT EXTRACTION Bilateral   . DILATION AND CURETTAGE OF UTERUS    . LAMINECTOMY  2000  . release of foraminal stenosis  2003   Family History  Problem Relation Age of Onset  . Breast cancer Maternal Aunt   . Hypertension Mother   . Hypertension Sister   . Heart disease Brother        S/P CABG  . Multiple sclerosis Sister        died 44   Social History   Socioeconomic History  . Marital status: Widowed    Spouse name: Not on file  . Number of children: 4  . Years of education: Not on file  . Highest education level: Not on file  Occupational History  . Occupation: retired  Tobacco Use  . Smoking status: Former Research scientist (life sciences)  . Smokeless tobacco: Never Used  Vaping Use  . Vaping Use: Never used  Substance and Sexual Activity  . Alcohol use: Yes    Alcohol/week: 5.0 - 6.0 standard drinks    Types: 5 - 6 Glasses of wine per week    Comment: rare  . Drug use: No  . Sexual activity: Never   Other Topics Concern  . Not on file  Social History Narrative  . Not on file   Social Determinants of Health   Financial Resource Strain: Low Risk   . Difficulty of Paying Living Expenses: Not hard at all  Food Insecurity: No Food Insecurity  . Worried About Charity fundraiser in the Last Year: Never true  . Ran Out of Food in the Last Year: Never true  Transportation Needs: No Transportation Needs  . Lack of Transportation (Medical): No  . Lack of Transportation (Non-Medical): No  Physical Activity: Sufficiently Active  . Days of Exercise per Week: 5 days  . Minutes of Exercise per Session: 30 min  Stress: No Stress Concern Present  . Feeling of Stress : Not at all  Social Connections: Unknown  . Frequency of Communication with Friends and Family: More than three times a week  . Frequency of Social Gatherings with Friends and Family: Twice a week  . Attends Religious Services: More than 4 times per year  . Active Member of Clubs or Organizations: Yes  . Attends Archivist Meetings: Not on file  . Marital Status: Not on file    Outpatient Encounter Medications as of  05/16/2020  Medication Sig  . Calcium Carbonate-Vitamin D 600-400 MG-UNIT tablet Take 1 tablet by mouth daily.  Marland Kitchen FLUAD QUADRIVALENT 0.5 ML injection   . metoprolol succinate (TOPROL-XL) 25 MG 24 hr tablet Take 0.5 tablets (12.5 mg total) by mouth daily.  . Multiple Vitamin (MULTIVITAMIN) tablet Take 1 tablet by mouth daily.  . Omega-3 Fatty Acids (FISH OIL) 1000 MG CAPS Take by mouth daily.  . [DISCONTINUED] benazepril (LOTENSIN) 40 MG tablet Take 1 tablet (40 mg total) by mouth 2 (two) times daily.  . [DISCONTINUED] felodipine (PLENDIL) 2.5 MG 24 hr tablet Take 1 tablet (2.5 mg total) by mouth daily.  . [DISCONTINUED] furosemide (LASIX) 20 MG tablet Take 1 tablet (20 mg total) by mouth daily.  . [DISCONTINUED] rosuvastatin (CRESTOR) 5 MG tablet Take 1 tablet (5 mg total) by mouth daily.  . benazepril  (LOTENSIN) 40 MG tablet Take 1 tablet (40 mg total) by mouth 2 (two) times daily.  . felodipine (PLENDIL) 2.5 MG 24 hr tablet Take 1 tablet (2.5 mg total) by mouth daily.  . furosemide (LASIX) 20 MG tablet Take 1 tablet (20 mg total) by mouth daily as needed.  . rosuvastatin (CRESTOR) 5 MG tablet Take 1 tablet (5 mg total) by mouth daily.   No facility-administered encounter medications on file as of 05/16/2020.    Review of Systems  Constitutional: Negative for appetite change and unexpected weight change.  HENT: Negative for congestion, sinus pressure and sore throat.   Eyes: Negative for pain and visual disturbance.  Respiratory: Negative for cough, chest tightness and shortness of breath.   Cardiovascular: Negative for chest pain, palpitations and leg swelling.  Gastrointestinal: Negative for abdominal pain, diarrhea, nausea and vomiting.  Genitourinary: Negative for difficulty urinating and dysuria.  Musculoskeletal: Negative for joint swelling and myalgias.  Skin: Negative for color change and rash.  Neurological: Negative for dizziness, light-headedness and headaches.  Hematological: Negative for adenopathy. Does not bruise/bleed easily.  Psychiatric/Behavioral: Negative for agitation and dysphoric mood.       Objective:    Physical Exam Vitals reviewed.  Constitutional:      General: She is not in acute distress.    Appearance: Normal appearance.  HENT:     Head: Normocephalic and atraumatic.     Right Ear: External ear normal.     Left Ear: External ear normal.  Eyes:     General: No scleral icterus.       Right eye: No discharge.        Left eye: No discharge.     Conjunctiva/sclera: Conjunctivae normal.  Neck:     Thyroid: No thyromegaly.  Cardiovascular:     Rate and Rhythm: Normal rate and regular rhythm.  Pulmonary:     Effort: No respiratory distress.     Breath sounds: Normal breath sounds. No wheezing.     Comments: Breasts:  Palpable nodule/thickenign -  2:00 left breast.  No other nodules or axillary adenopathy palpated.  Abdominal:     General: Bowel sounds are normal.     Palpations: Abdomen is soft.     Tenderness: There is no abdominal tenderness.  Musculoskeletal:        General: No swelling or tenderness.     Cervical back: Neck supple. No tenderness.  Lymphadenopathy:     Cervical: No cervical adenopathy.  Skin:    Findings: No erythema or rash.  Neurological:     Mental Status: She is alert.  Psychiatric:  Mood and Affect: Mood normal.        Behavior: Behavior normal.     BP (!) 142/72   Pulse 97   Temp 98.2 F (36.8 C)   Ht 5' (1.524 m)   Wt 130 lb 8 oz (59.2 kg)   SpO2 96%   BMI 25.49 kg/m  Wt Readings from Last 3 Encounters:  05/16/20 130 lb 8 oz (59.2 kg)  04/30/20 128 lb (58.1 kg)  11/09/19 127 lb 6.4 oz (57.8 kg)     Lab Results  Component Value Date   WBC 4.4 05/29/2019   HGB 13.4 05/29/2019   HCT 40.0 05/29/2019   PLT 201.0 05/29/2019   GLUCOSE 98 05/13/2020   CHOL 167 05/13/2020   TRIG 73.0 05/13/2020   HDL 88.80 05/13/2020   LDLCALC 64 05/13/2020   ALT 18 05/13/2020   AST 20 05/13/2020   NA 138 05/13/2020   K 4.1 05/13/2020   CL 103 05/13/2020   CREATININE 0.89 05/13/2020   BUN 17 05/13/2020   CO2 29 05/13/2020   TSH 1.07 11/07/2019   HGBA1C 5.6 05/13/2020    NM Myocar Multi W/Spect W/Wall Motion / EF  Result Date: 07/13/2016  Blood pressure demonstrated a normal response to exercise.  There was no ST segment deviation noted during stress.  No T wave inversion was noted during stress.  The study is normal.  This is a low risk study.  The left ventricular ejection fraction is hyperdynamic (>65%).        Assessment & Plan:   Problem List Items Addressed This Visit    Breast nodule    Breast nodule - palpated - 2:00 left breast.  Plan diagnostic mammogram and possible ultrasound.        Relevant Orders   MM DIAG BREAST TOMO BILATERAL (Completed)   US BREAST LTD UNI  LEFT INC AXILLA (Completed)   Health care maintenance    Physical today 05/16/20. Has previously declined mammogram.  Agreeable today given palpable nodule.        History of melanoma    Followed by dermatology.        Hyperglycemia    Low carb diet and exercise.  Follow met b and a1c.       Relevant Orders   Hemoglobin A1c   Hyperlipidemia    On crestor.  Low cholesterol diet and exercise.  Follow lipid panel and liver function tests.        Relevant Medications   benazepril (LOTENSIN) 40 MG tablet   felodipine (PLENDIL) 2.5 MG 24 hr tablet   furosemide (LASIX) 20 MG tablet   rosuvastatin (CRESTOR) 5 MG tablet   Other Relevant Orders   Hepatic function panel   Lipid panel   Hypertension    Blood pressure has been doing well on felodipine, lasix prn, lotensin and metoprolol.  Follow pressures.  Follow metabolic panel.       Relevant Medications   benazepril (LOTENSIN) 40 MG tablet   felodipine (PLENDIL) 2.5 MG 24 hr tablet   furosemide (LASIX) 20 MG tablet   rosuvastatin (CRESTOR) 5 MG tablet   Other Relevant Orders   CBC with Differential/Platelet   TSH   Basic metabolic panel   Narrow complex tachycardia ([place])    Has seen cardiology.  Found on monitor. On toprol.  Doing well.  Follow.       Stress    Overall appears to be handling things well.  Follow.  Does not feel needs any  further intervention.  Has good support.        Other Visit Diagnoses    Routine general medical examination at a health care facility    -  Primary       Einar Pheasant, MD

## 2020-05-16 NOTE — Assessment & Plan Note (Addendum)
Physical today 05/16/20. Has previously declined mammogram.  Agreeable today given palpable nodule.

## 2020-05-24 ENCOUNTER — Other Ambulatory Visit: Payer: Self-pay

## 2020-05-24 ENCOUNTER — Ambulatory Visit
Admission: RE | Admit: 2020-05-24 | Discharge: 2020-05-24 | Disposition: A | Discharge status: Home / Self Care | Payer: PPO | Source: Ambulatory Visit | Attending: Internal Medicine | Admitting: Internal Medicine

## 2020-05-24 DIAGNOSIS — N63 Unspecified lump in unspecified breast: Secondary | ICD-10-CM

## 2020-05-24 DIAGNOSIS — N6321 Unspecified lump in the left breast, upper outer quadrant: Secondary | ICD-10-CM | POA: Diagnosis not present

## 2020-05-24 DIAGNOSIS — R928 Other abnormal and inconclusive findings on diagnostic imaging of breast: Secondary | ICD-10-CM | POA: Diagnosis not present

## 2020-05-26 NOTE — Assessment & Plan Note (Signed)
Has seen cardiology.  Found on monitor. On toprol.  Doing well.  Follow.

## 2020-05-26 NOTE — Assessment & Plan Note (Signed)
Overall appears to be handling things well.  Follow.  Does not feel needs any further intervention.  Has good support.

## 2020-05-26 NOTE — Assessment & Plan Note (Signed)
Low carb diet and exercise.  Follow met b and a1c.

## 2020-05-26 NOTE — Assessment & Plan Note (Signed)
Followed by dermatology

## 2020-05-26 NOTE — Assessment & Plan Note (Signed)
Blood pressure has been doing well on felodipine, lasix prn, lotensin and metoprolol.  Follow pressures.  Follow metabolic panel.

## 2020-05-26 NOTE — Assessment & Plan Note (Signed)
On crestor.  Low cholesterol diet and exercise.  Follow lipid panel and liver function tests.   

## 2020-05-26 NOTE — Assessment & Plan Note (Signed)
Breast nodule - palpated - 2:00 left breast.  Plan diagnostic mammogram and possible ultrasound.

## 2020-05-27 ENCOUNTER — Other Ambulatory Visit: Payer: Self-pay | Admitting: Internal Medicine

## 2020-05-27 DIAGNOSIS — N6320 Unspecified lump in the left breast, unspecified quadrant: Secondary | ICD-10-CM

## 2020-05-27 DIAGNOSIS — R928 Other abnormal and inconclusive findings on diagnostic imaging of breast: Secondary | ICD-10-CM

## 2020-05-28 NOTE — Progress Notes (Signed)
Orders have been signed. Thank you!

## 2020-05-28 NOTE — Progress Notes (Signed)
Routing back to you for documentation

## 2020-05-28 NOTE — Progress Notes (Signed)
Have called pt and left message to discuss mammogram.  If she calls back, find out a good number and times I can call back to discuss.

## 2020-05-28 NOTE — Progress Notes (Signed)
I spoke to Erin [name].  She is agreeable to be scheduled.  What do I need to do to get this scheduled?  Thanks

## 2020-06-04 ENCOUNTER — Ambulatory Visit
Admission: RE | Admit: 2020-06-04 | Discharge: 2020-06-04 | Disposition: A | Discharge status: Home / Self Care | Payer: PPO | Source: Ambulatory Visit | Attending: Internal Medicine | Admitting: Internal Medicine

## 2020-06-04 ENCOUNTER — Other Ambulatory Visit: Payer: Self-pay

## 2020-06-04 DIAGNOSIS — R928 Other abnormal and inconclusive findings on diagnostic imaging of breast: Secondary | ICD-10-CM | POA: Insufficient documentation

## 2020-06-04 DIAGNOSIS — D0512 Intraductal carcinoma in situ of left breast: Secondary | ICD-10-CM | POA: Diagnosis not present

## 2020-06-04 DIAGNOSIS — N6320 Unspecified lump in the left breast, unspecified quadrant: Secondary | ICD-10-CM | POA: Insufficient documentation

## 2020-06-04 HISTORY — PX: BREAST BIOPSY: SHX20

## 2020-06-06 ENCOUNTER — Telehealth: Payer: Self-pay | Admitting: Internal Medicine

## 2020-06-06 NOTE — Telephone Encounter (Signed)
Patient called in stated that she had spoke with Dr.Scott about racing pulse and she took her that if it kept do this to call for an appointment

## 2020-06-06 NOTE — Telephone Encounter (Signed)
Spoken to patient. She stated she spoke with Dr Nicki Reaper about this last week on a phone call. Patient heart rate is high 80 to mid 90's. Patient stated she does not have any sx of SOB, Chest px, arm px, jaw px, blurry vision, heart palpitations, and dizziness. She would also like to see Dr Nicki Reaper requarding her recent mammogram asap. She stated Dr Nicki Reaper informed her that she would work her in. Please advise.

## 2020-06-06 NOTE — Telephone Encounter (Signed)
Appointmernt has been made.

## 2020-06-06 NOTE — Telephone Encounter (Signed)
The only time I have to work her in tomorrow is during lunch.  See if she can come in at 12:30 tomorrow.  If this does not work, I will work her in next week.

## 2020-06-07 ENCOUNTER — Telehealth: Payer: Self-pay

## 2020-06-07 ENCOUNTER — Telehealth: Payer: Self-pay | Admitting: Family

## 2020-06-07 ENCOUNTER — Other Ambulatory Visit: Payer: Self-pay

## 2020-06-07 ENCOUNTER — Ambulatory Visit (INDEPENDENT_AMBULATORY_CARE_PROVIDER_SITE_OTHER): Payer: PPO | Admitting: Internal Medicine

## 2020-06-07 VITALS — BP 124/78 | HR 90 | Temp 98.0°F | Resp 16 | Ht 60.0 in | Wt 130.2 lb

## 2020-06-07 DIAGNOSIS — D0592 Unspecified type of carcinoma in situ of left breast: Secondary | ICD-10-CM | POA: Diagnosis not present

## 2020-06-07 DIAGNOSIS — R739 Hyperglycemia, unspecified: Secondary | ICD-10-CM | POA: Diagnosis not present

## 2020-06-07 DIAGNOSIS — R000 Tachycardia, unspecified: Secondary | ICD-10-CM

## 2020-06-07 DIAGNOSIS — I4891 Unspecified atrial fibrillation: Secondary | ICD-10-CM | POA: Diagnosis not present

## 2020-06-07 DIAGNOSIS — I10 Essential (primary) hypertension: Secondary | ICD-10-CM

## 2020-06-07 DIAGNOSIS — E785 Hyperlipidemia, unspecified: Secondary | ICD-10-CM | POA: Diagnosis not present

## 2020-06-07 LAB — SURGICAL PATHOLOGY

## 2020-06-07 MED ORDER — METOPROLOL SUCCINATE ER 25 MG PO TB24: 25.0000 mg | ORAL_TABLET | Freq: Every day | ORAL | 3 refills | Status: DC

## 2020-06-07 MED ORDER — APIXABAN 2.5 MG PO TABS: 2.5000 mg | ORAL_TABLET | Freq: Two times a day (BID) | ORAL | 1 refills | Status: DC

## 2020-06-07 MED ORDER — METOPROLOL SUCCINATE 12.5 MG HALF TABLET: 12.5000 mg | ORAL_TABLET | Freq: Every day | ORAL | Status: DC

## 2020-06-07 NOTE — Patient Instructions (Signed)
Increase metoprolol to 25mg  per day  Start eliquis 2.5mg  twice a day

## 2020-06-07 NOTE — Telephone Encounter (Signed)
Adding on Wednesday at2 .  Will call patient when home later today.

## 2020-06-07 NOTE — Progress Notes (Signed)
Patient phoned me after receiving biopsy results from Electa Sniff.  She also saw her primary provider, and spoke to her cardiologist regarding a-fib today.  Will schedule surgical consult with Dr. Bary Castilla per patient request, and Med/Onc request on Monday 06/10/20.  Prefers Thursday appointments.

## 2020-06-07 NOTE — Telephone Encounter (Signed)
Samples of Eliquis 2.5mg  were given to the patient, quantity 4 boxes- 14 tablets per box, Lot Number [id number]

## 2020-06-07 NOTE — Progress Notes (Addendum)
Patient ID: Erin [name], female   DOB: 01/21/1931, 87 y.o.   MRN: [id number]   Subjective:    Patient ID: Erin [name], female    DOB: [birth year], 87 y.o.   MRN: [id number]  HPI This visit occurred during the SARS-CoV-2 public health emergency.  Safety protocols were in place, including screening questions prior to the visit, additional usage of staff PPE, and extensive cleaning of exam room while observing appropriate contact time as indicated for disinfecting solutions.  Patient here for work in appt. Work in to discuss recent notice of increased heart rate. She has been recording her blood pressures and pulse rate.  Noticed pulse rate increasing from 60s - 80-90s range.  No chest pain or sob.  Notices occasional increased heart rate, but no other symptoms.  No headache, dizziness or light headedness.  No symptoms today in the clinic. She also recently had palpable breast area on exam.  Mammogram recommended biopsy.  Biopsy - ductal carcinoma in situ.  Discussed need for further evaluation.  Eating and drinking ok.  No nausea or vomiting.  Bowels moving.    Past Medical History:  Diagnosis Date  . Chest pain 07/13/2016  . History of colon polyps   . Hypercholesterolemia   . Hypertension    Past Surgical History:  Procedure Laterality Date  . APPENDECTOMY  1946  . BREAST BIOPSY Left 06/04/2020   [place] bx, vision marker, path pending  . CATARACT EXTRACTION Bilateral   . DILATION AND CURETTAGE OF UTERUS    . LAMINECTOMY  2000  . release of foraminal stenosis  2003   Family History  Problem Relation Age of Onset  . Breast cancer Maternal Aunt   . Hypertension Mother   . Hypertension Sister   . Heart disease Brother        S/P CABG  . Multiple sclerosis Sister        died 9   Social History   Socioeconomic History  . Marital status: Widowed    Spouse name: Not on file  . Number of children: 4  . Years of education: Not on file  . Highest education level: Not on file   Occupational History  . Occupation: retired  Tobacco Use  . Smoking status: Former Research scientist (life sciences)  . Smokeless tobacco: Never Used  Vaping Use  . Vaping Use: Never used  Substance and Sexual Activity  . Alcohol use: Yes    Alcohol/week: 5.0 - 6.0 standard drinks    Types: 5 - 6 Glasses of wine per week    Comment: rare  . Drug use: No  . Sexual activity: Never  Other Topics Concern  . Not on file  Social History Narrative  . Not on file   Social Determinants of Health   Financial Resource Strain: Low Risk   . Difficulty of Paying Living Expenses: Not hard at all  Food Insecurity: No Food Insecurity  . Worried About Charity fundraiser in the Last Year: Never true  . Ran Out of Food in the Last Year: Never true  Transportation Needs: No Transportation Needs  . Lack of Transportation (Medical): No  . Lack of Transportation (Non-Medical): No  Physical Activity: Sufficiently Active  . Days of Exercise per Week: 5 days  . Minutes of Exercise per Session: 30 min  Stress: No Stress Concern Present  . Feeling of Stress : Not at all  Social Connections: Unknown  . Frequency of Communication with Friends and Family: More than three  times a week  . Frequency of Social Gatherings with Friends and Family: Twice a week  . Attends Religious Services: More than 4 times per year  . Active Member of Clubs or Organizations: Yes  . Attends Archivist Meetings: Not on file  . Marital Status: Not on file    Outpatient Encounter Medications as of 06/07/2020  Medication Sig  . apixaban (ELIQUIS) 2.5 MG TABS tablet Take 1 tablet (2.5 mg total) by mouth 2 (two) times daily.  . benazepril (LOTENSIN) 40 MG tablet Take 1 tablet (40 mg total) by mouth 2 (two) times daily.  . Calcium Carbonate-Vitamin D 600-400 MG-UNIT tablet Take 1 tablet by mouth daily.  . felodipine (PLENDIL) 2.5 MG 24 hr tablet Take 1 tablet (2.5 mg total) by mouth daily.  Marland Kitchen FLUAD QUADRIVALENT 0.5 ML injection   .  furosemide (LASIX) 20 MG tablet Take 1 tablet (20 mg total) by mouth daily as needed.  . Multiple Vitamin (MULTIVITAMIN) tablet Take 1 tablet by mouth daily.  . Omega-3 Fatty Acids (FISH OIL) 1000 MG CAPS Take by mouth daily.  . rosuvastatin (CRESTOR) 5 MG tablet Take 1 tablet (5 mg total) by mouth daily.  . [DISCONTINUED] metoprolol succinate (TOPROL-XL) 25 MG 24 hr tablet Take 0.5 tablets (12.5 mg total) by mouth daily.  . metoprolol succinate (TOPROL-XL) 25 MG 24 hr tablet Take 1 tablet (25 mg total) by mouth daily.   Facility-Administered Encounter Medications as of 06/07/2020  Medication  . metoprolol succinate (TOPROL-XL) 24 hr tablet 12.5 mg    Review of Systems  Constitutional: Negative for appetite change and unexpected weight change.  HENT: Negative for congestion and sinus pressure.   Respiratory: Negative for cough, chest tightness and shortness of breath.   Cardiovascular: Negative for chest pain and leg swelling.       Intermittent notice of increased pulse rate.    Gastrointestinal: Negative for abdominal pain, diarrhea, nausea and vomiting.  Genitourinary: Negative for difficulty urinating and dysuria.  Musculoskeletal: Negative for joint swelling and myalgias.  Skin: Negative for color change and rash.  Neurological: Negative for dizziness, light-headedness and headaches.  Psychiatric/Behavioral: Negative for agitation and dysphoric mood.       Objective:    Physical Exam Vitals reviewed.  Constitutional:      General: She is not in acute distress.    Appearance: Normal appearance.  HENT:     Head: Normocephalic and atraumatic.     Right Ear: External ear normal.     Left Ear: External ear normal.  Eyes:     General: No scleral icterus.       Right eye: No discharge.        Left eye: No discharge.     Conjunctiva/sclera: Conjunctivae normal.  Neck:     Thyroid: No thyromegaly.  Cardiovascular:     Rate and Rhythm: Normal rate.     Comments: Irregular  rhythm - ventricular rate initially approximately 140 Pulmonary:     Effort: No respiratory distress.     Breath sounds: Normal breath sounds. No wheezing.  Abdominal:     General: Bowel sounds are normal.     Palpations: Abdomen is soft.     Tenderness: There is no abdominal tenderness.  Musculoskeletal:        General: No swelling or tenderness.     Cervical back: Neck supple. No tenderness.  Lymphadenopathy:     Cervical: No cervical adenopathy.  Skin:    Findings: No erythema or  rash.  Neurological:     Mental Status: She is alert.  Psychiatric:        Mood and Affect: Mood normal.        Behavior: Behavior normal.     BP 124/78   Pulse 90   Temp 98 F (36.7 C) (Oral)   Resp 16   Ht 5' (1.524 m)   Wt 130 lb 3.2 oz (59.1 kg)   SpO2 98%   BMI 25.43 kg/m  Wt Readings from Last 3 Encounters:  06/07/20 130 lb 3.2 oz (59.1 kg)  05/16/20 130 lb 8 oz (59.2 kg)  04/30/20 128 lb (58.1 kg)     Lab Results  Component Value Date   WBC 5.7 06/07/2020   HGB 14.4 06/07/2020   HCT 43.8 06/07/2020   PLT 214 06/07/2020   GLUCOSE 92 06/07/2020   CHOL 167 05/13/2020   TRIG 73.0 05/13/2020   HDL 88.80 05/13/2020   LDLCALC 64 05/13/2020   ALT 18 05/13/2020   AST 20 05/13/2020   NA 142 06/07/2020   K 4.6 06/07/2020   CL 105 06/07/2020   CREATININE 0.92 (H) 06/07/2020   BUN 13 06/07/2020   CO2 24 06/07/2020   TSH 0.83 06/07/2020   HGBA1C 5.6 05/13/2020    MM CLIP PLACEMENT LEFT  Result Date: 06/04/2020 CLINICAL DATA:  Status post ultrasound-guided core needle biopsy of the recently demonstrated 3.0 cm palpable mass in the 2 o'clock position of the left breast. EXAM: 3D DIAGNOSTIC LEFT MAMMOGRAM POST ULTRASOUND BIOPSY COMPARISON:  Previous exam(s). FINDINGS: 3D Mammographic images were obtained following ultrasound guided biopsy of the recently demonstrated 3.0 cm mass in the 2 o'clock position of the left breast. The biopsy marking clip is in expected position at the site  of biopsy. IMPRESSION: Appropriate positioning of the Vision shaped biopsy marking clip at the site of biopsy in the biopsied mass containing calcifications in the 2 o'clock position of the left breast. Final Assessment: Post Procedure Mammograms for Marker Placement Electronically Signed   By: Claudie Revering M.D.   On: 06/04/2020 14:01   [place] LT BREAST BX W LOC DEV 1ST LESION IMG BX SPEC US GUIDE  Result Date: 06/04/2020 CLINICAL DATA:  3.0 cm palpable mass seen at recent mammography and ultrasound in the 2 o'clock position of the left breast. EXAM: ULTRASOUND GUIDED LEFT BREAST CORE NEEDLE BIOPSY COMPARISON:  Previous exam(s). PROCEDURE: I met with the patient and we discussed the procedure of ultrasound-guided biopsy, including benefits and alternatives. We discussed the high likelihood of a successful procedure. We discussed the risks of the procedure, including infection, bleeding, tissue injury, clip migration, and inadequate sampling. Informed written consent was given. The usual time-out protocol was performed immediately prior to the procedure. Lesion quadrant: Upper outer quadrant Using sterile technique and 1% Lidocaine as local anesthetic, under direct ultrasound visualization, a 12 gauge spring-loaded device was used to perform biopsy of the recently demonstrated 3.0 cm mass in the 2 o'clock position of the left breast, 6 cm from the nipple, using a caudal approach. At the conclusion of the procedure a Vision shaped tissue marker clip was deployed into the biopsy cavity. Follow up 2 view mammogram was performed and dictated separately. IMPRESSION: Ultrasound guided biopsy of the recently demonstrated 3.0 cm mass in the 2 o'clock position of the left breast. No apparent complications. Electronically Signed   By: Claudie Revering M.D.   On: 06/04/2020 13:31       Assessment & Plan:  Problem List Items Addressed This Visit    Atrial fibrillation [facility])    Work in today for noticed of increased heart  rate as outlined.  No symptoms today during visit.  Initial heart rate on EKG - 140.  EKG - Afib with RVR.  No chest pain.  No sob.  Currently doing well with no significant symptoms.  Gave her 12.66m metoprolol here in the office.  Was monitored.  Rhythm strips reviewed.  Gradual decrease in pulse rate - 112-120s.  Started on eliquis.  Will increased her home metoprolol to 237mq day.  Discussed with cardiology and they are going to work her in next week with plans for further evaluation, zio monitor, etc.  Notified her son of above.  Daughter - n - law picked her up and drove her home.  Any change in symptoms or problems, she is to be evaluated.  eliquis samples given.       Relevant Medications   metoprolol succinate (TOPROL-XL) 24 hr tablet 12.5 mg   apixaban (ELIQUIS) 2.5 MG TABS tablet   metoprolol succinate (TOPROL-XL) 25 MG 24 hr tablet   Other Relevant Orders   Basic metabolic panel (Completed)   CBC with Differential/Platelet (Completed)   Magnesium (Completed)   TSH (Completed)   Carcinoma in situ of breast    Recent palpable nodule and abnormal mammogram.  Biopsy as outlined.  Refer to surgery for further evaluation.  Recent afib with RVR.  Will need to get heart issues sorted through/stable prior to surgery.        Relevant Orders   Ambulatory referral to General Surgery   Hyperglycemia    Low carb diet and exercise.  Follow met b and a1c.       Hyperlipidemia    Continue crestor.  Low cholesterol diet and exercise  Follow lipid panel and liver function tests.        Relevant Medications   metoprolol succinate (TOPROL-XL) 24 hr tablet 12.5 mg   apixaban (ELIQUIS) 2.5 MG TABS tablet   metoprolol succinate (TOPROL-XL) 25 MG 24 hr tablet   Hypertension    Blood pressures on outside checks reviewed as outlined.  Recheck here improved.  Increase metoprolol to 2556m day.  Continue to follow pulse rate and blood pressure.  Keep f/u with cardiology.       Relevant Medications    metoprolol succinate (TOPROL-XL) 24 hr tablet 12.5 mg   apixaban (ELIQUIS) 2.5 MG TABS tablet   metoprolol succinate (TOPROL-XL) 25 MG 24 hr tablet    Other Visit Diagnoses    Tachycardia    -  Primary   Relevant Medications   metoprolol succinate (TOPROL-XL) 24 hr tablet 12.5 mg   Other Relevant Orders   EKG 12-Lead (Completed)   EKG 12-Lead (Completed)      I spent over 45 minutes with the patient and more than 50% of the time was spent in consultation regarding the above.  Time spent discussing her current concerns with her heart rate and biopsy results.  Time also spent discussing new diagnosis of afib and need for further treatment and evaluation.   ChaEinar PheasantD

## 2020-06-07 NOTE — Telephone Encounter (Signed)
Received call from Dr. Nicki Reaper the patient's PCP.  EKG in the office independently reviewed shows atrial fibrillation 142 bpm.  Discussed with Dr. Nicki Reaper and Dr. Saunders Revel. Miss Erin [name] reported palpitations at home though not in clinic, no lightheadedness or dizziness.    She is on 12.5 mg of Toprol daily and was recommended for an additional 12.5 mg of Toprol while in Dr. Bary Leriche office.  Dr. Nicki Reaper kindly ordered BMP, magnesium, TSH, CBC. This will rule out electrolyte, thyroid, or anemia as contributory to atrial fibrillation.   [name] will  Increase Toprol to 25mg  daily  Start Eliquis 2.5mg  BID due to CHADS2VASc of at least 4 (agex2, gender, hypertension). 1 month of samples provided in Dr. Bary Leriche office.   Have follow up with our office early next week. Will route to our scheduling team to call and arrange.   Loel Dubonnet, NP

## 2020-06-08 LAB — CBC WITH DIFFERENTIAL/PLATELET
Absolute Monocytes: 547 cells/uL (ref 200–950)
Basophils Absolute: 40 cells/uL (ref 0–200)
Basophils Relative: 0.7 %
Eosinophils Absolute: 68 cells/uL (ref 15–500)
Eosinophils Relative: 1.2 %
HCT: 43.8 % (ref 35.0–45.0)
Hemoglobin: 14.4 g/dL (ref 11.7–15.5)
Lymphs Abs: 1511 cells/uL (ref 850–3900)
MCH: 32.1 pg (ref 27.0–33.0)
MCHC: 32.9 g/dL (ref 32.0–36.0)
MCV: 97.6 fL (ref 80.0–100.0)
MPV: 10.3 fL (ref 7.5–12.5)
Monocytes Relative: 9.6 %
Neutro Abs: 3534 cells/uL (ref 1500–7800)
Neutrophils Relative %: 62 %
Platelets: 214 10*3/uL (ref 140–400)
RBC: 4.49 10*6/uL (ref 3.80–5.10)
RDW: 12.4 % (ref 11.0–15.0)
Total Lymphocyte: 26.5 %
WBC: 5.7 10*3/uL (ref 3.8–10.8)

## 2020-06-08 LAB — BASIC METABOLIC PANEL
BUN/Creatinine Ratio: 14 (calc) (ref 6–22)
BUN: 13 mg/dL (ref 7–25)
CO2: 24 mmol/L (ref 20–32)
Calcium: 10.6 mg/dL — ABNORMAL HIGH (ref 8.6–10.4)
Chloride: 105 mmol/L (ref 98–110)
Creat: 0.92 mg/dL — ABNORMAL HIGH (ref 0.60–0.88)
Glucose, Bld: 92 mg/dL (ref 65–99)
Potassium: 4.6 mmol/L (ref 3.5–5.3)
Sodium: 142 mmol/L (ref 135–146)

## 2020-06-08 LAB — TSH: TSH: 0.83 mIU/L (ref 0.40–4.50)

## 2020-06-08 LAB — MAGNESIUM: Magnesium: 2 mg/dL (ref 1.5–2.5)

## 2020-06-09 ENCOUNTER — Encounter: Payer: Self-pay | Admitting: Internal Medicine

## 2020-06-09 DIAGNOSIS — D0590 Unspecified type of carcinoma in situ of unspecified breast: Secondary | ICD-10-CM | POA: Insufficient documentation

## 2020-06-09 NOTE — Assessment & Plan Note (Signed)
Continue crestor.  Low cholesterol diet and exercise. Follow lipid panel and liver function tests.   

## 2020-06-09 NOTE — Assessment & Plan Note (Signed)
Low carb diet and exercise.  Follow met b and a1c.  

## 2020-06-09 NOTE — Assessment & Plan Note (Signed)
Work in today for noticed of increased heart rate as outlined.  No symptoms today during visit.  Initial heart rate on EKG - 140.  EKG - Afib with RVR.  No chest pain.  No sob.  Currently doing well with no significant symptoms.  Gave her 12.5mg  metoprolol here in the office.  Was monitored.  Rhythm strips reviewed.  Gradual decrease in pulse rate - 112-120s.  Started on eliquis.  Will increased her home metoprolol to 25mg  q day.  Discussed with cardiology and they are going to work her in next week with plans for further evaluation, zio monitor, etc.  Notified her son of above.  Daughter - n - law picked her up and drove her home.  Any change in symptoms or problems, she is to be evaluated.  eliquis samples given.

## 2020-06-09 NOTE — Assessment & Plan Note (Signed)
Blood pressures on outside checks reviewed as outlined.  Recheck here improved.  Increase metoprolol to 25mg  q day.  Continue to follow pulse rate and blood pressure.  Keep f/u with cardiology.

## 2020-06-09 NOTE — Addendum Note (Signed)
Addended by: Alisa Graff on: 06/09/2020 02:21 PM   Modules accepted: Orders

## 2020-06-09 NOTE — Assessment & Plan Note (Addendum)
Recent palpable nodule and abnormal mammogram.  Biopsy as outlined.  Refer to surgery for further evaluation.  Recent afib with RVR.  Will need to get heart issues sorted through/stable prior to surgery.

## 2020-06-11 NOTE — Progress Notes (Signed)
Cardiology Office Note:    Date:  06/12/2020   ID:  Erin [name], DOB 1930-05-26, MRN [id number]  PCP:  Einar Pheasant, MD  [facility] Cardiologist:  Nelva Bush, MD  [facility] Electrophysiologist:  None   Referring MD: Einar Pheasant, MD   Chief Complaint: Afib follow-up  History of Present Illness:    Erin [name] is a 87 y.o. female with a hx of atrial arrhythmia, HLD, HTN who presents for follow-up for newly diagnosed Afib.   Previously valuated for atrial arrhythmia.  Per Dr. Ricard Dillon review her arrhythmia was previously labeled as A. fib, though no clear evidence of that based on prior records.  She was taking baby aspirin daily.  Additional testing was deferred at that time.  She wore an event monitor April 2018 in July noting atrial runs, frequent PACs, occasional PVCs.  Exercise Myoview April 2018 was low risk, without evidence of ischemia or scar.  She had an echo May 2018 with LVEF 55 to 65%, normal wall motion, grade 1 diastolic dysfunction, mild LA enlargement  More recently the patient was seen by her PCP 06/07/2020 and was noted to be tachycardic.  EKG showed A. fib, confirmed by cardiology office.  She was started on Eliquis 2.5 mg twice daily (weight, age) CHA2DS2-VASc of 4. Toprol was increased to 25 mg daily.  Today, the patient is in Afib with elevated rates, 114 bpm. She feels fine.  When in normal sinus rhythm heart rate in the 50-60s. She started eliquis and increase Toprol dose on Friday afternoon. On Friday the pulse was 80, she has also seen it in the 90s. BP today is good, 108/60. Patient says it can be lower than this. Just found cancer in the left breast. She has an appointment in the morning with Dr. Kassie Mends. It's very small and contained. No history of cancer. The biopsy was a week ago, one day prior to afib RVR.  Patient denies chest pain, shortness of breath, LLE, orthopnea, pnd, fever, chills. Eating and drinking normally. She walks most morning and  has no symptoms. She walks 30-40 minutes, 1.5 miles. Always has a walking stick. No dizziness or lightheadedness. She lives and alone and can complete all ADLs. Recent BMEt, CBC, TSH, Mag unremarkable.Discussed heart monitor, but patient said she had a severe skin reaction with the last one and   CHADSVASC = age x 2, female, CHF, HTN No h/o stroke, diabetes, PAD   Past Medical History:  Diagnosis Date  . Chest pain 07/13/2016  . History of colon polyps   . Hypercholesterolemia   . Hypertension     Past Surgical History:  Procedure Laterality Date  . APPENDECTOMY  1946  . BREAST BIOPSY Left 06/04/2020   [place] bx, vision marker, path pending  . CATARACT EXTRACTION Bilateral   . DILATION AND CURETTAGE OF UTERUS    . LAMINECTOMY  2000  . release of foraminal stenosis  2003    Current Medications: Current Meds  Medication Sig  . apixaban (ELIQUIS) 2.5 MG TABS tablet Take 1 tablet (2.5 mg total) by mouth 2 (two) times daily.  . Calcium Carbonate-Vitamin D 600-400 MG-UNIT tablet Take 1 tablet by mouth daily.  . felodipine (PLENDIL) 2.5 MG 24 hr tablet Take 1 tablet (2.5 mg total) by mouth daily.  Marland Kitchen FLUAD QUADRIVALENT 0.5 ML injection   . furosemide (LASIX) 20 MG tablet Take 1 tablet (20 mg total) by mouth daily as needed.  . Multiple Vitamin (MULTIVITAMIN) tablet Take 1  tablet by mouth daily.  . Omega-3 Fatty Acids (FISH OIL) 1000 MG CAPS Take by mouth daily.  . rosuvastatin (CRESTOR) 5 MG tablet Take 1 tablet (5 mg total) by mouth daily.  . [DISCONTINUED] benazepril (LOTENSIN) 40 MG tablet Take 1 tablet (40 mg total) by mouth 2 (two) times daily.  . [DISCONTINUED] metoprolol succinate (TOPROL-XL) 25 MG 24 hr tablet Take 1 tablet (25 mg total) by mouth daily.   Current Facility-Administered Medications for the 06/12/20 encounter (Office Visit) with Kathlen Mody, Ozie Lupe H, PA-C  Medication  . metoprolol succinate (TOPROL-XL) 24 hr tablet 12.5 mg     Allergies:   Erythromycin and Minocin  [minocycline hcl]   Social History   Socioeconomic History  . Marital status: Widowed    Spouse name: Not on file  . Number of children: 4  . Years of education: Not on file  . Highest education level: Not on file  Occupational History  . Occupation: retired  Tobacco Use  . Smoking status: Former Research scientist (life sciences)  . Smokeless tobacco: Never Used  Vaping Use  . Vaping Use: Never used  Substance and Sexual Activity  . Alcohol use: Yes    Alcohol/week: 5.0 - 6.0 standard drinks    Types: 5 - 6 Glasses of wine per week    Comment: rare  . Drug use: No  . Sexual activity: Never  Other Topics Concern  . Not on file  Social History Narrative  . Not on file   Social Determinants of Health   Financial Resource Strain: Low Risk   . Difficulty of Paying Living Expenses: Not hard at all  Food Insecurity: No Food Insecurity  . Worried About Charity fundraiser in the Last Year: Never true  . Ran Out of Food in the Last Year: Never true  Transportation Needs: No Transportation Needs  . Lack of Transportation (Medical): No  . Lack of Transportation (Non-Medical): No  Physical Activity: Sufficiently Active  . Days of Exercise per Week: 5 days  . Minutes of Exercise per Session: 30 min  Stress: No Stress Concern Present  . Feeling of Stress : Not at all  Social Connections: Unknown  . Frequency of Communication with Friends and Family: More than three times a week  . Frequency of Social Gatherings with Friends and Family: Twice a week  . Attends Religious Services: More than 4 times per year  . Active Member of Clubs or Organizations: Yes  . Attends Archivist Meetings: Not on file  . Marital Status: Not on file     Family History: The patient's family history includes Breast cancer in her maternal aunt; Heart disease in her brother; Hypertension in her mother and sister; Multiple sclerosis in her sister.  ROS:   Please see the history of present illness.     All other  systems reviewed and are negative.  EKGs/Labs/Other Studies Reviewed:    The following studies were reviewed today: Echo 2018 Study Conclusions   - Left ventricle: The cavity size was normal. Systolic function was  normal. The estimated ejection fraction was in the range of 55%  to 65%. Wall motion was normal; there were no regional wall  motion abnormalities. Doppler parameters are consistent with  abnormal left ventricular relaxation (grade 1 diastolic  dysfunction).  - Left atrium: The atrium was mildly dilated.  - Right ventricle: Systolic function was normal.  - Pulmonary arteries: Systolic pressure was within the normal  range.   Long term heart monitor  2018    The patient was monitored for 13 days, 21 hours.  The predominant rhythm was sinus with an average rate of 68 bpm (range 35-147 bpm in sinus rhythm).  There were frequent PACs, most of which were isolated. Occasional isolated PVCs were also observed.  A single episode of nonsustained ventricular tachycardia lasting 5 beats with a maximum rate of 203 bpm was observed.  Multiple (237) atrial runs were identified, some of which are irregular. The longest episode was 19.4 seconds. Maximal heart rate during an atrial run was 226 bpm.  No sustained arrhythmias or prolonged pauses were identified.  There were no patient triggered events.   Predominantly sinus rhythm with frequent PACs and occasional PVCs. Single episode of NSVT. Multiple atrial runs lasting up to 19.4 seconds. No sustained arrhythmias.   EKG:  EKG is  ordered today.  The ekg ordered today demonstrates Afib RVR, 114 bpm, nonspecific T wave changes  Recent Labs: 05/13/2020: ALT 18 06/07/2020: BUN 13; Creat 0.92; Hemoglobin 14.4; Magnesium 2.0; Platelets 214; Potassium 4.6; Sodium 142; TSH 0.83  Recent Lipid Panel    Component Value Date/Time   CHOL 167 05/13/2020 0801   TRIG 73.0 05/13/2020 0801   HDL 88.80 05/13/2020 0801   CHOLHDL  2 05/13/2020 0801   VLDL 14.6 05/13/2020 0801   LDLCALC 64 05/13/2020 0801    Physical Exam:    VS:  BP 108/60 (BP Location: Left Arm, Patient Position: Sitting, Cuff Size: Normal)   Pulse (!) 114   Ht 5' (1.524 m)   Wt 130 lb (59 kg)   BMI 25.39 kg/m     Wt Readings from Last 3 Encounters:  06/12/20 130 lb (59 kg)  06/07/20 130 lb 3.2 oz (59.1 kg)  05/16/20 130 lb 8 oz (59.2 kg)     GEN: Well nourished, well developed in no acute distress HEENT: Normal NECK: No JVD; No carotid bruits LYMPHATICS: No lymphadenopathy CARDIAC: Irreg Irreg, tachycardic, no murmurs, rubs, gallops RESPIRATORY:  Clear to auscultation without rales, wheezing or rhonchi  ABDOMEN: Soft, non-tender, non-distended MUSCULOSKELETAL:  No edema; No deformity  SKIN: Warm and dry NEUROLOGIC:  Alert and oriented x 3 PSYCHIATRIC:  Normal affect   ASSESSMENT:    1. Paroxysmal atrial fibrillation (HCC)   2. Hyperlipidemia, unspecified hyperlipidemia type   3. Essential hypertension   4. Chronic diastolic heart failure (HCC)    PLAN:    In order of problems listed above:  PAF in RVR H/o Atrial arrhythmia Long history of atrial arrhythmia, unsure if it was afib, but no prior confirmation on EKGs. Patient had left breast biopsy last week (confirmed breast cancer) and the next day fund to be in afib RVR with rates in the 120s. Toprol was increased and she was started on Eliquis 2.5mg  BID (weight and age) for [facility] of at least 4. BMET, CBC, TSH, Mag unremarkable. Today she is in afib with mildly improved rates, 114bpm, although she says she has seen her rate in the 80-90s at times. Heart monitor discussed but she had a severe skin reaction with the last one, she does not want to pursue this. Echo in 2018 showed normal EF with G1DD. Will repeat the echo. BP borderline and patient says it can be even lower at home. She is on lotensin and Toprol. I will decrease Lotensin to 40 mg daily and increase Toprol-XL to  50mg  daily to hopefully achieve better rate control. Patient is going to meet with surgeon tomorrow about tumor removal,  might be local anesthesia since it's small/contianed. In light of possible procedure I would wait on cardioversion, since she will have to have 30 days on uninterrupted anticoagulation. Also, patient is asymptomatic. Will see patient back in 7-10 days to check BP and heart rate. Also to consider cardioversion if remains in afib.  HTN Bps borderline. Decrease lotensin to 40mg  daily. Increase Toprol to 50mg  daily. Patient was instructed to call if bp>100/60.  HLD Continue crestor. LDL 64 recently.  HFpEF Euvolemic on exam. Repeat echo as above. Continue current lasix dose.   Disposition: Follow up 7-10 days with App/MD   Signed, Alesi Zachery Ninfa Meeker, PA-C  06/12/2020 3:20 PM    [facility]

## 2020-06-12 ENCOUNTER — Other Ambulatory Visit: Payer: Self-pay

## 2020-06-12 ENCOUNTER — Encounter: Payer: Self-pay | Admitting: Medical

## 2020-06-12 ENCOUNTER — Ambulatory Visit: Payer: PPO | Admitting: Medical

## 2020-06-12 VITALS — BP 108/60 | HR 114 | Ht 60.0 in | Wt 130.0 lb

## 2020-06-12 DIAGNOSIS — I10 Essential (primary) hypertension: Secondary | ICD-10-CM | POA: Diagnosis not present

## 2020-06-12 DIAGNOSIS — I480 Paroxysmal atrial fibrillation: Secondary | ICD-10-CM | POA: Diagnosis not present

## 2020-06-12 DIAGNOSIS — I5032 Chronic diastolic (congestive) heart failure: Secondary | ICD-10-CM

## 2020-06-12 DIAGNOSIS — E785 Hyperlipidemia, unspecified: Secondary | ICD-10-CM

## 2020-06-12 MED ORDER — METOPROLOL SUCCINATE ER 50 MG PO TB24: 50.0000 mg | ORAL_TABLET | Freq: Every day | ORAL | 3 refills | Status: DC

## 2020-06-12 MED ORDER — BENAZEPRIL HCL 40 MG PO TABS: 40.0000 mg | ORAL_TABLET | Freq: Every day | ORAL | 1 refills | Status: DC

## 2020-06-12 NOTE — Patient Instructions (Signed)
Medication Instructions:   Your physician has recommended you make the following change in your medication:   1. DECREASE Benazepril - Take one tablet by mouth daily.  2. INCREASE Metoprolol - Take 50MG  by mouth daily.   *If you need a refill on your cardiac medications before your next appointment, please call your pharmacy*   Lab Work:  1. None ordered  If you have labs (blood work) drawn today and your tests are completely normal, you will receive your results only by: Marland Kitchen MyChart Message (if you have MyChart) OR . A paper copy in the mail If you have any lab test that is abnormal or we need to change your treatment, we will call you to review the results.   Testing/Procedures:  1. Echocardiogram  Please return to [facility] on ______________ at _______________ AM/PM for an Echocardiogram. Your physician has requested that you have an echocardiogram. Echocardiography is a painless test that uses sound waves to create images of your heart. It provides your doctor with information about the size and shape of your heart and how well your heart's chambers and valves are working. This procedure takes approximately one hour. There are no restrictions for this procedure. Please note; depending on visual quality an IV may need to be placed.     Follow-Up: At [facility], you and your health needs are our priority.  As part of our continuing mission to provide you with exceptional heart care, we have created designated Provider Care Teams.  These Care Teams include your primary Cardiologist (physician) and Advanced Practice Providers (APPs -  Physician Assistants and Nurse Practitioners) who all work together to provide you with the care you need, when you need it.  We recommend signing up for the patient portal called "MyChart".  Sign up information is provided on this After Visit Summary.  MyChart is used to connect with patients for Virtual Visits (Telemedicine).   Patients are able to view lab/test results, encounter notes, upcoming appointments, etc.  Non-urgent messages can be sent to your provider as well.   To learn more about what you can do with MyChart, go to [email or web address].    Your next appointment:   7-10 days  The format for your next appointment:   In Person  Provider:   You may see Nelva Bush, MD or one of the following Advanced Practice Providers on your designated Care Team:    Cadence Kathlen Mody, [place]    Other Instructions  - If your blood pressure is BELOW 100/60 please call us.

## 2020-06-13 DIAGNOSIS — D0512 Intraductal carcinoma in situ of left breast: Secondary | ICD-10-CM | POA: Diagnosis not present

## 2020-06-14 ENCOUNTER — Other Ambulatory Visit: Payer: Self-pay | Admitting: General Surgery

## 2020-06-14 DIAGNOSIS — D0512 Intraductal carcinoma in situ of left breast: Secondary | ICD-10-CM

## 2020-06-14 NOTE — Progress Notes (Signed)
Subjective:     Patient ID: Erin [name] is a 87 y.o. female.  HPI  The following portions of the patient's history were reviewed and updated as appropriate.  This a new patient is here today for: office visit. Here for evaluation of newly diagnosed left breast cancer. She states she had stopped having mammograms at least 5 years ago but Dr Nicki Reaper felt a lump in the breast.   The day after her diagnostic mammogram, May 25, 2020 she noted a marked change in her baseline pulse with typically runs 55-60 up into the 110-120 range.  She was subsequently identified with atrial fibrillation.  Even with this change in rate she did not notice appreciate appreciable decrease in her exercise tolerance.  She usually walks about a mile and a half each morning.  She was started on low-dose metoprolol 12 and half milligrams per day and as well as Eliquis.  Based on persistent elevated heart rate she was changed to a metoprolol dose of 50 mg, the first higher dose was taken this morning. The patient denies any chest pain, shortness of breath or difficulty with ambulation.  She has had 4 children, 2 were born in [place] while her late husband, a [employer] trained ophthalmologist was stationed there in the service.  Her daughter developed neuroendocrine tumor of the pancreas but did live for 13 years.  She has 3 boys, Sherren Mocha, is a podiatrist here at the clinic.  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  Most of her siblings and parents lived into their mid 42s.  Breast cancer history is notable for a maternal aunt.  Review of Systems  Constitutional: Negative for chills and fever.  Respiratory: Negative for cough.        Chief Complaint  Patient presents with  . New Patient     BP (!) 142/86   Pulse (!) 111   Temp 36.2 C (97.1 F)   Ht 152.4 cm (5')   Wt 58.5 kg (129 lb)   SpO2 98%   BMI 25.19 kg/m       Past Medical History:  Diagnosis Date  .  Abnormal mammogram   . Chest pain, unspecified   . History of colonic polyps   . History of melanoma   . Hypercholesterolemia   . Hyperglycemia, unspecified   . Hypertension   . Left arm numbness   . Leukopenia           Past Surgical History:  Procedure Laterality Date  . APPENDECTOMY  1946  . BREAST EXCISIONAL BIOPSY Left 06/04/2020  . CATARACT EXTRACTION Bilateral   . COLONOSCOPY  2014  . DILATION AND CURETTAGE, DIAGNOSTIC / THERAPEUTIC    . laminectomy of spine  2000  . release of foraminal stenosis  2003              OB History    Gravida  4   Para  4   Term      Preterm      AB      Living        SAB      IAB      Ectopic      Molar      Multiple      Live Births          Obstetric Comments  Age at first period  59 Age of first pregnancy 78  Social History          Socioeconomic History  . Marital status: Widowed    Spouse name: Not on file  . Number of children: Not on file  . Years of education: Not on file  . Highest education level: Not on file  Occupational History  . Not on file  Tobacco Use  . Smoking status: Former Smoker    Packs/day: 0.25    Years: 30.00    Pack years: 7.50    Quit date: 03/16/1998    Years since quitting: 22.2  . Smokeless tobacco: Never Used  Substance and Sexual Activity  . Alcohol use: Yes    Comment: glass of wine occasionally  . Drug use: Never  . Sexual activity: Not on file  Other Topics Concern  . Not on file  Social History Narrative  . Not on file   Social Determinants of Health   Financial Resource Strain: Not on file  Food Insecurity: Not on file  Transportation Needs: Not on file            Allergies  Allergen Reactions  . Erythromycin Other (See Comments)    GI intolerance GI intolerance    . Minocycline Hcl Other (See Comments)    dizziness dizziness      Current Medications        Current Outpatient  Medications  Medication Sig Dispense Refill  . apixaban (ELIQUIS) 2.5 mg tablet Take 2.5 mg by mouth 2 (two) times daily    . benazepril (LOTENSIN) 40 MG tablet Take 40 mg by mouth once daily       . calcium carbonate-vit D3-min 600 mg calcium- 400 unit Tab Take by mouth once daily       . felodipine (PLENDIL) 2.5 MG ER tablet Take 2.5 mg by mouth once daily       . FUROsemide (LASIX) 20 MG tablet Take 20 mg by mouth once daily as needed       . metoprolol succinate (TOPROL-XL) 50 MG XL tablet Take 50 mg by mouth once daily    . multivitamin (MULTIVITAMIN) tablet Take by mouth once daily       . omega-3 acid ethyl esters (LOVAZA) 1 gram capsule Take by mouth.    . rosuvastatin (CRESTOR) 5 MG tablet Take 5 mg by mouth once daily    . METOPROLOL TARTRATE ORAL Take 25 mg by mouth once daily    (Patient not taking: Reported on 06/13/2020  )     No current facility-administered medications for this visit.           Family History  Problem Relation Age of Onset  . High blood pressure (Hypertension) Mother   . High blood pressure (Hypertension) Sister   . Multiple sclerosis Sister   . Heart disease Brother   . Breast cancer Maternal Aunt   . Pancreatic cancer Daughter   . Colon cancer Neg Hx           Objective:   Physical Exam Exam conducted with a chaperone present.  Constitutional:      Appearance: Normal appearance.  Cardiovascular:     Rate and Rhythm: Tachycardia present. Rhythm irregularly irregular.     Pulses: Normal pulses.     Heart sounds: Normal heart sounds.  Pulmonary:     Effort: Pulmonary effort is normal.     Breath sounds: Normal breath sounds.  Chest:  Breasts:     Right: Normal. No axillary adenopathy or supraclavicular adenopathy.  Left: Mass present. No axillary adenopathy or supraclavicular adenopathy.        Comments: 3+ centimeter mass in the upper outer quadrant of the left breast.  Minimal inferior bruising  from recent core biopsy.  No abnormality of the nipple-areolar complex. Musculoskeletal:     Cervical back: Neck supple.  Lymphadenopathy:     Upper Body:     Right upper body: No supraclavicular or axillary adenopathy.     Left upper body: No supraclavicular or axillary adenopathy.  Skin:    General: Skin is warm and dry.  Neurological:     Mental Status: She is alert and oriented to person, place, and time.  Psychiatric:        Mood and Affect: Mood normal.        Behavior: Behavior normal.    Labs and Radiology:   Laboratory studies of June 07, 2020 were reviewed.  Normal comprehensive metabolic panel, estimated GFR 58.  Normal hepatic function panel.  Hemoglobin A1c: 5.6.  CBC: Hemoglobin 14.4 with an MCV of 97.6.  White blood cell count of 5700.  Platelet count of 214,000.  ECG of June 07, 2020 showed atrial fibrillation with a rapid ventricular response.   Mammograms and ultrasounds from May 24, 2020 through June 04, 2020 were independently reviewed.  Irregular mass in the upper outer quadrant left breast.   Pathology review: DIAGNOSIS:  A. BREAST, LEFT 2:00 6 CM FN; ULTRASOUND-GUIDED BIOPSY:  - DUCTAL CARCINOMA IN SITU (DCIS), INTERMEDIATE NUCLEAR GRADE, WITH  SCLEROSIS.  - SEE COMMENT.   Comment:  Myoepithelial immunohistochemical stains p63 and calponin were performed  and demonstrate the presence of scattered myoepithelial cells. The  presence of focal sclerosis is worrisome for but not diagnostic of  invasive carcinoma.  Immunohistochemistry for ER will be performed and reported in an  addendum.   ADDENDUM:  CASE SUMMARY: BREAST BIOMARKER TESTS  TEST(S) PERFORMED:  Estrogen Receptor (ER) Status: POSITIVE      Percentage of cells with nuclear positivity: Greater than 90%      Average intensity of staining: Strong         Assessment:     Palpable mass of the left breast with identification of intermediate grade DCIS.     Plan:     It is very uncommon to have a palpable mass of DCIS, and more likely is at least microinvasive cancer.  I would be reluctant to consider this patient for antiestrogen therapy alone.  With its location in the upper outer quadrant and volume of tissue that will need to be resected for clear margins, I think that she be an appropriate candidate for sentinel node biopsy as it should provide minimal morbidity and significant information.  With her family history of LivingWell in the 90s I think we need to look at the 5 to 10-year range rather than a more short-term goal for disease control.  The patient is amenable to surgical exploration and this will be scheduled at a convenient date as an outpatient.  She will keep track of her pulse twice a day between now and surgery to be sure that the increased dose of metoprolol is providing adequate rate control.  She is aware that she should stop her Eliquis 2 days prior to the procedure.  She is at very low risk for embolic event while off Eliquis as she was started on anticoagulations within a day of recognizing her irregular rhythm and is unlikely had time to form any atrial clots.  We did however review this very tiny risk.  The case was reviewed with her son after the visit.  Approximately 1 hour was spent on today's visit, the majority of which was on reviewing options for management.    Patient to be scheduled for a left lumpectomy with SLN for 06-24-20 at [facility]. She has been instructed to hold Eliquis 2 days prior. Patient aware to apply EMLA cream the day of surgery one hour prior and cover with saran wrap.      Entered by Karie Fetch, RN, acting as a scribe for Dr. Hervey Ard, MD.  The documentation recorded by the scribe accurately reflects the service I personally performed and the decisions made by me.   Robert Bellow, MD FACS

## 2020-06-18 ENCOUNTER — Encounter
Admission: RE | Admit: 2020-06-18 | Discharge: 2020-06-18 | Disposition: A | Discharge status: Home / Self Care | Payer: PPO | Source: Ambulatory Visit | Attending: General Surgery | Admitting: General Surgery

## 2020-06-18 ENCOUNTER — Other Ambulatory Visit: Payer: Self-pay

## 2020-06-18 HISTORY — DX: Malignant (primary) neoplasm, unspecified: C80.1

## 2020-06-18 HISTORY — DX: Unspecified atrial fibrillation: I48.91

## 2020-06-18 NOTE — Patient Instructions (Signed)
Your procedure is scheduled on: 06/24/20 Report to [place] AT 9:15 AS PREVIOUSLY INSTRUCTED IN [place]. [facility] IS FREE SAME DAY SURGERY [phone]   RADIOLOGY [phone].  Remember: Instructions that are not followed completely may result in serious medical risk, up to and including death, or upon the discretion of your surgeon and anesthesiologist your surgery may need to be rescheduled.     _X__ 1. Do not eat food after midnight the night before your procedure.                 No gum chewing or hard candies. You may drink clear liquids up to 2 hours                 before you are scheduled to arrive for your surgery- DO not drink clear                 liquids within 2 hours of the start of your surgery.                 Clear Liquids include:  water, apple juice without pulp, clear carbohydrate                 drink such as Clearfast or Gatorade, Black Coffee or Tea (Do not add                 anything to coffee or tea). Diabetics water only  __X__2.  On the morning of surgery brush your teeth with toothpaste and water, you                 may rinse your mouth with mouthwash if you wish.  Do not swallow any              toothpaste of mouthwash.     _X__ 3.  No Alcohol for 24 hours before or after surgery.   _X__ 4.  Do Not Smoke or use e-cigarettes For 24 Hours Prior to Your Surgery.                 Do not use any chewable tobacco products for at least 6 hours prior to                 surgery.  ____  5.  Bring all medications with you on the day of surgery if instructed.   __X__  6.  Notify your doctor if there is any change in your medical condition      (cold, fever, infections).     Do not wear jewelry, make-up, hairpins, clips or nail polish. Do not wear lotions, powders, deodorant or perfumes.  Do not shave 48 hours prior to surgery. Men may shave face and neck. Do not bring valuables to the hospital.    [facility] is not responsible for any  belongings or valuables.  Contacts, dentures/partials or body piercings may not be worn into surgery. Bring a case for your contacts, glasses or hearing aids, a denture cup will be supplied. Leave your suitcase in the car. After surgery it may be brought to your room. For patients admitted to the hospital, discharge time is determined by your treatment team.   Patients discharged the day of surgery will not be allowed to drive home.   Please read over the following fact sheets that you were given:    CHG SOAP  __X__ Take these medicines the morning of surgery with A SIP OF WATER:  1. metoprolol succinate (TOPROL-XL) 50 MG 24 hr tablet  2. felodipine (PLENDIL) 2.5 MG 24 hr tablet  3.   4.  5.  6.  ____ Fleet Enema (as directed)   __X__ Use CHG Soap/SAGE wipes as directed  ____ Use inhalers on the day of surgery  ____ Stop metformin/Janumet/Farxiga 2 days prior to surgery    ____ Take 1/2 of usual insulin dose the night before surgery. No insulin the morning          of surgery.   __X__ Al Decant 2 DAYS PRIOR TO SURGERY AS INSTRUCTED   __X__ Stop Anti-inflammatories 7 days before surgery such as Advil, Ibuprofen, Motrin,  BC or Goodies Powder, Naprosyn, Naproxen, Aleve, Aspirin    __X__ Stop all herbal supplements, fish oil or vitamin E until after surgery. STOP OMEGA 3 TODAY 06/18/20   ____ Bring C-Pap to the hospital.

## 2020-06-19 NOTE — Progress Notes (Signed)
Cardiology Office Note:    Date:  06/21/2020   ID:  Erin [name], DOB [birth year], MRN [id number]  PCP:  Erin Pheasant, MD  [facility] Cardiologist:  Erin Bush, MD  [facility] Electrophysiologist:  None   Referring MD: Erin Pheasant, MD   Chief Complaint: Follow-up afib  History of Present Illness:    Erin [name] is a 87 y.o. female with a hx of atrial arrhythmia, hyperlipidemia, hypertension, recently diagnosed A. Fib.  Patient was remotely seen by Dr. Ricard [name], who diagnosed her with possible A. fib, though no clear evidence based on prior records.  She was taking baby aspirin.  Additional testing was deferred at that time.  She worn an event monitor April 2018 in July noting atrial runs, frequent PACs, occasional PVCs.  Exercise Myoview April 2018 was low risk, without evidence of ischemia or scar.  She had an echo May 2018 with LVEF 55 to 60%, normal wall motion, grade 1 diastolic dysfunction, mild LA enlargement.  Patient was seen by primary care 06/07/2020 and was tachycardic.  EKG showed A. fib which was confirmed by cardiology office. She was started on Eliquis 2.5 mg daily (age, weight). CHA2DS2-VASc of 4. Toprol was increased to 25 mg daily.  She was seen in the cardiology office 06/12/2020 and had elevated rates in the 1 teens.  She was asymptomatic.  Was recently diagnosed with breast cancer and had a surgery consult in the morning. Due to borderline pressures her Lotensin was decreased and Toprol was increased for better heart rate control.  Echo was ordered.  Patient had previous skin reaction with heart monitor, so did not want to pursue this.  Today, EKG shows afib with heart rate 103bpm. Patient is asymptomatic. At home the heart rate has been 80-90s. BP has been 120/80. Today BP is high, which is abnormal for the patient. Patient has surgery Monday, breast lumpectomy with lymph node biopsy, and is holding eliquis starting tomorrow. No chest pain , sob, lower leg  edema, fever, chills.   Past Medical History:  Diagnosis Date  . A-fib ([place])   . Cancer (HCC)    melanoma - arm left  . Chest pain 07/13/2016  . History of colon polyps   . Hypercholesterolemia   . Hypertension     Past Surgical History:  Procedure Laterality Date  . APPENDECTOMY  1946  . BREAST BIOPSY Left 06/04/2020   [place] bx, vision marker, path pending  . CATARACT EXTRACTION Bilateral   . DILATION AND CURETTAGE OF UTERUS    . LAMINECTOMY  2000  . release of foraminal stenosis  2003    Current Medications: Current Meds  Medication Sig  . apixaban (ELIQUIS) 2.5 MG TABS tablet Take 1 tablet (2.5 mg total) by mouth 2 (two) times daily.  . benazepril (LOTENSIN) 40 MG tablet Take 1 tablet (40 mg total) by mouth daily.  . Calcium Carbonate-Vitamin D 600-400 MG-UNIT tablet Take 1 tablet by mouth daily.  . felodipine (PLENDIL) 2.5 MG 24 hr tablet Take 1 tablet (2.5 mg total) by mouth daily.  . furosemide (LASIX) 20 MG tablet Take 1 tablet (20 mg total) by mouth daily as needed.  . Multiple Vitamin (MULTIVITAMIN) tablet Take 1 tablet by mouth daily.  . Omega-3 Fatty Acids (FISH OIL) 1000 MG CAPS Take 1,000 mg by mouth daily.  . rosuvastatin (CRESTOR) 5 MG tablet Take 1 tablet (5 mg total) by mouth daily.  . [DISCONTINUED] metoprolol succinate (TOPROL-XL) 50 MG 24 hr tablet Take  1 tablet (50 mg total) by mouth daily.     Allergies:   Erythromycin and Minocin [minocycline hcl]   Social History   Socioeconomic History  . Marital status: Widowed    Spouse name: Not on file  . Number of children: 4  . Years of education: Not on file  . Highest education level: Not on file  Occupational History  . Occupation: retired  Tobacco Use  . Smoking status: Former Research scientist (life sciences)  . Smokeless tobacco: Never Used  Vaping Use  . Vaping Use: Never used  Substance and Sexual Activity  . Alcohol use: Yes    Alcohol/week: 0.0 standard drinks    Comment: occassionally  . Drug use: No  . Sexual  activity: Never  Other Topics Concern  . Not on file  Social History Narrative  . Not on file   Social Determinants of Health   Financial Resource Strain: Low Risk   . Difficulty of Paying Living Expenses: Not hard at all  Food Insecurity: No Food Insecurity  . Worried About Charity fundraiser in the Last Year: Never true  . Ran Out of Food in the Last Year: Never true  Transportation Needs: No Transportation Needs  . Lack of Transportation (Medical): No  . Lack of Transportation (Non-Medical): No  Physical Activity: Sufficiently Active  . Days of Exercise per Week: 5 days  . Minutes of Exercise per Session: 30 min  Stress: No Stress Concern Present  . Feeling of Stress : Not at all  Social Connections: Unknown  . Frequency of Communication with Friends and Family: More than three times a week  . Frequency of Social Gatherings with Friends and Family: Twice a week  . Attends Religious Services: More than 4 times per year  . Active Member of Clubs or Organizations: Yes  . Attends Archivist Meetings: Not on file  . Marital Status: Not on file     Family History: The patient's family history includes Breast cancer in her maternal aunt; Heart disease in her brother; Hypertension in her mother and sister; Multiple sclerosis in her sister.  ROS:   Please see the history of present illness.     All other systems reviewed and are negative.  EKGs/Labs/Other Studies Reviewed:    The following studies were reviewed today:  Echo ordered  EKG:  EKG is  ordered today.  The ekg ordered today demonstrates Afib, 103bpm, PVC.  Recent Labs: 05/13/2020: ALT 18 06/07/2020: BUN 13; Creat 0.92; Hemoglobin 14.4; Magnesium 2.0; Platelets 214; Potassium 4.6; Sodium 142; TSH 0.83  Recent Lipid Panel    Component Value Date/Time   CHOL 167 05/13/2020 0801   TRIG 73.0 05/13/2020 0801   HDL 88.80 05/13/2020 0801   CHOLHDL 2 05/13/2020 0801   VLDL 14.6 05/13/2020 0801   LDLCALC 64  05/13/2020 0801       Physical Exam:    VS:  BP (!) 150/90 (BP Location: Left Arm, Patient Position: Sitting, Cuff Size: Normal)   Pulse (!) 103   Ht 5' (1.524 m)   Wt 129 lb (58.5 kg)   SpO2 91%   BMI 25.19 kg/m     Wt Readings from Last 3 Encounters:  06/21/20 129 lb (58.5 kg)  06/18/20 127 lb (57.6 kg)  06/12/20 130 lb (59 kg)     PFX:TKWI nourished, well developed in no acute distress HEENT: Normal NECK: No JVD; No carotid bruits LYMPHATICS: No lymphadenopathy CARDIAC: Irreg Irreg, tachycardic, no murmurs, rubs, gallops RESPIRATORY:  Clear to auscultation without rales, wheezing or rhonchi  ABDOMEN: Soft, non-tender, non-distended MUSCULOSKELETAL:  No edema; No deformity  SKIN: Warm and dry NEUROLOGIC:  Alert and oriented x 3 PSYCHIATRIC:  Normal affect   ASSESSMENT:    1. Paroxysmal atrial fibrillation (HCC)   2. Hyperlipidemia, unspecified hyperlipidemia type   3. Essential hypertension    PLAN:    In order of problems listed above:  A. Fib Today she is still in afib RVR, heart rate 103bpm, which is improved from last time. She is asymptomatic. Plan for surgery Monday and Eliquis will be stopped tomorrow. CHADSVASC = 4 (HTN, agex2, CHF). Restart Eliquis as soon as possible after surgery.  Can consider Aspirin in the perioperative period. She reports some lower heart rates in the 80-90s at home. Patient refuses heart monitor due to skin reaction to the last one. Echo scheduled. Patient will follow-up with Dr. Saunders Revel in 3 weeks. Cardioversion can be discussed if patient remains in afib. This patients CHA2DS2-VASc Score and unadjusted Ischemic Stroke Rate (% per year) is equal to 4.8 % stroke rate/year from a score of 4  Above score calculated as 1 point each if present [CHF, HTN, DM, Vascular=MI/PAD/Aortic Plaque, Age if 65-74, or Female] Above score calculated as 2 points each if present [Age > 75, or Stroke/TIA/TE]   Breast cancer Breast lumpectomy with lymph  node biopsy by Dr. Bary Castilla on 4/11. Plan to hold Eliquis 2 days prior to procedure, restart after as soon as possible per surgeons instructions.   Hypertension Has been a littler high since decreasing Lotensin at the last visit. She is on Toprol 50mg  daily, will increase to 100mg  as above.   HLD Continue statin  Patient is requesting to see Dr. Saunders Revel  Disposition: Follow up in 3 week(s) with MD   Signed, Arvil Utz Ninfa Meeker, PA-C  06/21/2020 4:49 PM    [place]

## 2020-06-20 ENCOUNTER — Other Ambulatory Visit
Admission: RE | Admit: 2020-06-20 | Discharge: 2020-06-20 | Disposition: A | Discharge status: Home / Self Care | Payer: PPO | Source: Ambulatory Visit | Attending: General Surgery | Admitting: General Surgery

## 2020-06-20 ENCOUNTER — Other Ambulatory Visit: Payer: Self-pay | Admitting: General Surgery

## 2020-06-20 ENCOUNTER — Other Ambulatory Visit: Payer: Self-pay

## 2020-06-20 DIAGNOSIS — Z01812 Encounter for preprocedural laboratory examination: Secondary | ICD-10-CM | POA: Insufficient documentation

## 2020-06-20 DIAGNOSIS — D0512 Intraductal carcinoma in situ of left breast: Secondary | ICD-10-CM

## 2020-06-20 DIAGNOSIS — Z20822 Contact with and (suspected) exposure to covid-19: Secondary | ICD-10-CM | POA: Insufficient documentation

## 2020-06-21 ENCOUNTER — Ambulatory Visit: Payer: PPO | Admitting: Medical

## 2020-06-21 ENCOUNTER — Encounter: Payer: Self-pay | Admitting: Medical

## 2020-06-21 VITALS — BP 150/90 | HR 103 | Ht 60.0 in | Wt 129.0 lb

## 2020-06-21 DIAGNOSIS — I480 Paroxysmal atrial fibrillation: Secondary | ICD-10-CM | POA: Diagnosis not present

## 2020-06-21 DIAGNOSIS — E785 Hyperlipidemia, unspecified: Secondary | ICD-10-CM | POA: Diagnosis not present

## 2020-06-21 DIAGNOSIS — I10 Essential (primary) hypertension: Secondary | ICD-10-CM | POA: Diagnosis not present

## 2020-06-21 LAB — SARS CORONAVIRUS 2 (TAT 6-24 HRS): SARS Coronavirus 2: NEGATIVE

## 2020-06-21 MED ORDER — METOPROLOL SUCCINATE ER 50 MG PO TB24: ORAL_TABLET | ORAL | Status: DC

## 2020-06-21 NOTE — Patient Instructions (Addendum)
Medication Instructions:  - Your physician has recommended you make the following change in your medication:   1) INCREASE toprol xl (metoprolol succinate)  50 mg- take 2 tablets (100 mg) by mouth once daily   *If you need a refill on your cardiac medications before your next appointment, please call your pharmacy*   Lab Work: - none ordered  If you have labs (blood work) drawn today and your tests are completely normal, you will receive your results only by: Marland Kitchen MyChart Message (if you have MyChart) OR . A paper copy in the mail If you have any lab test that is abnormal or we need to change your treatment, we will call you to review the results.   Testing/Procedures: - Echocardiogram as scheduled   Follow-Up: At [facility], you and your health needs are our priority.  As part of our continuing mission to provide you with exceptional heart care, we have created designated Provider Care Teams.  These Care Teams include your primary Cardiologist (physician) and Advanced Practice Providers (APPs -  Physician Assistants and Nurse Practitioners) who all work together to provide you with the care you need, when you need it.  We recommend signing up for the patient portal called "MyChart".  Sign up information is provided on this After Visit Summary.  MyChart is used to connect with patients for Virtual Visits (Telemedicine).  Patients are able to view lab/test results, encounter notes, upcoming appointments, etc.  Non-urgent messages can be sent to your provider as well.   To learn more about what you can do with MyChart, go to [email or web address].    Your next appointment:   3 week(s)  The format for your next appointment:   In Person  Provider:   Nelva Bush, MD   Other Instructions n/a

## 2020-06-23 ENCOUNTER — Encounter: Payer: Self-pay | Admitting: Medical

## 2020-06-24 ENCOUNTER — Ambulatory Visit: Payer: PPO | Admitting: Anesthesiology

## 2020-06-24 ENCOUNTER — Ambulatory Visit
Admission: RE | Admit: 2020-06-24 | Discharge: 2020-06-24 | Disposition: A | Discharge status: Home / Self Care | Payer: PPO | Source: Ambulatory Visit | Attending: General Surgery | Admitting: General Surgery

## 2020-06-24 ENCOUNTER — Encounter
Admission: RE | Disposition: A | Discharge status: Home / Self Care | Payer: Self-pay | Source: Home / Self Care | Attending: General Surgery

## 2020-06-24 ENCOUNTER — Encounter
Admission: RE | Admit: 2020-06-24 | Discharge: 2020-06-24 | Disposition: A | Discharge status: Home / Self Care | Payer: PPO | Source: Ambulatory Visit | Attending: General Surgery | Admitting: General Surgery

## 2020-06-24 ENCOUNTER — Ambulatory Visit
Admission: RE | Admit: 2020-06-24 | Discharge: 2020-06-24 | Disposition: A | Discharge status: Home / Self Care | Payer: PPO | Attending: General Surgery | Admitting: General Surgery

## 2020-06-24 ENCOUNTER — Other Ambulatory Visit: Payer: Self-pay

## 2020-06-24 DIAGNOSIS — Z85820 Personal history of malignant melanoma of skin: Secondary | ICD-10-CM | POA: Insufficient documentation

## 2020-06-24 DIAGNOSIS — Z881 Allergy status to other antibiotic agents status: Secondary | ICD-10-CM | POA: Insufficient documentation

## 2020-06-24 DIAGNOSIS — I10 Essential (primary) hypertension: Secondary | ICD-10-CM | POA: Insufficient documentation

## 2020-06-24 DIAGNOSIS — C50912 Malignant neoplasm of unspecified site of left female breast: Secondary | ICD-10-CM | POA: Diagnosis not present

## 2020-06-24 DIAGNOSIS — C50412 Malignant neoplasm of upper-outer quadrant of left female breast: Secondary | ICD-10-CM | POA: Diagnosis not present

## 2020-06-24 DIAGNOSIS — Z79899 Other long term (current) drug therapy: Secondary | ICD-10-CM | POA: Diagnosis not present

## 2020-06-24 DIAGNOSIS — R928 Other abnormal and inconclusive findings on diagnostic imaging of breast: Secondary | ICD-10-CM | POA: Diagnosis not present

## 2020-06-24 DIAGNOSIS — Z87891 Personal history of nicotine dependence: Secondary | ICD-10-CM | POA: Diagnosis not present

## 2020-06-24 DIAGNOSIS — D0512 Intraductal carcinoma in situ of left breast: Secondary | ICD-10-CM | POA: Diagnosis not present

## 2020-06-24 DIAGNOSIS — Z7901 Long term (current) use of anticoagulants: Secondary | ICD-10-CM | POA: Diagnosis not present

## 2020-06-24 DIAGNOSIS — R739 Hyperglycemia, unspecified: Secondary | ICD-10-CM | POA: Diagnosis not present

## 2020-06-24 DIAGNOSIS — E785 Hyperlipidemia, unspecified: Secondary | ICD-10-CM | POA: Diagnosis not present

## 2020-06-24 HISTORY — PX: BREAST LUMPECTOMY WITH SENTINEL LYMPH NODE BIOPSY: SHX5597

## 2020-06-24 SURGERY — BREAST LUMPECTOMY WITH SENTINEL LYMPH NODE BX
Anesthesia: General | Laterality: Left

## 2020-06-24 MED ORDER — DEXAMETHASONE SODIUM PHOSPHATE 10 MG/ML IJ SOLN
INTRAMUSCULAR | Status: AC
  Filled 2020-06-24: qty 1

## 2020-06-24 MED ORDER — TECHNETIUM TC 99M TILMANOCEPT KIT
1.0860 | PACK | Freq: Once | INTRAVENOUS | Status: AC | PRN
  Administered 2020-06-24: 1.086 via INTRADERMAL

## 2020-06-24 MED ORDER — FENTANYL CITRATE (PF) 100 MCG/2ML IJ SOLN
INTRAMUSCULAR | Status: AC
  Administered 2020-06-24: 25 ug via INTRAVENOUS
  Filled 2020-06-24: qty 2

## 2020-06-24 MED ORDER — ONDANSETRON HCL 4 MG/2ML IJ SOLN
INTRAMUSCULAR | Status: DC | PRN
  Administered 2020-06-24: 4 mg via INTRAVENOUS

## 2020-06-24 MED ORDER — PHENYLEPHRINE HCL (PRESSORS) 10 MG/ML IV SOLN
INTRAVENOUS | Status: DC | PRN
  Administered 2020-06-24: 200 ug via INTRAVENOUS
  Administered 2020-06-24: 100 ug via INTRAVENOUS
  Administered 2020-06-24 (×2): 200 ug via INTRAVENOUS
  Administered 2020-06-24: 100 ug via INTRAVENOUS
  Administered 2020-06-24: 200 ug via INTRAVENOUS

## 2020-06-24 MED ORDER — CHLORHEXIDINE GLUCONATE 0.12 % MT SOLN
OROMUCOSAL | Status: AC
  Administered 2020-06-24: 15 mL via OROMUCOSAL
  Filled 2020-06-24: qty 15

## 2020-06-24 MED ORDER — METHYLENE BLUE 0.5 % INJ SOLN
INTRAVENOUS | Status: DC | PRN
  Administered 2020-06-24: 4 mL via SUBMUCOSAL

## 2020-06-24 MED ORDER — ONDANSETRON HCL 4 MG/2ML IJ SOLN: 4.0000 mg | Freq: Once | INTRAMUSCULAR | Status: DC | PRN

## 2020-06-24 MED ORDER — LACTATED RINGERS IV SOLN: INTRAVENOUS | Status: DC

## 2020-06-24 MED ORDER — METOPROLOL TARTRATE 5 MG/5ML IV SOLN
INTRAVENOUS | Status: DC | PRN
  Administered 2020-06-24 (×4): 1 mg via INTRAVENOUS

## 2020-06-24 MED ORDER — BUPIVACAINE-EPINEPHRINE (PF) 0.5% -1:200000 IJ SOLN
INTRAMUSCULAR | Status: AC
  Filled 2020-06-24: qty 30

## 2020-06-24 MED ORDER — ACETAMINOPHEN 10 MG/ML IV SOLN
INTRAVENOUS | Status: DC | PRN
  Administered 2020-06-24: 1000 mg via INTRAVENOUS

## 2020-06-24 MED ORDER — METOPROLOL TARTRATE 5 MG/5ML IV SOLN
INTRAVENOUS | Status: AC
  Administered 2020-06-24: 2 mg via INTRAVENOUS
  Filled 2020-06-24: qty 5

## 2020-06-24 MED ORDER — FENTANYL CITRATE (PF) 100 MCG/2ML IJ SOLN
25.0000 ug | INTRAMUSCULAR | Status: DC | PRN
  Administered 2020-06-24: 25 ug via INTRAVENOUS

## 2020-06-24 MED ORDER — ORAL CARE MOUTH RINSE: 15.0000 mL | Freq: Once | OROMUCOSAL | Status: AC

## 2020-06-24 MED ORDER — APIXABAN 2.5 MG PO TABS: 2.5000 mg | ORAL_TABLET | Freq: Two times a day (BID) | ORAL | 1 refills | Status: DC

## 2020-06-24 MED ORDER — FAMOTIDINE 20 MG PO TABS: 20.0000 mg | ORAL_TABLET | Freq: Once | ORAL | Status: AC

## 2020-06-24 MED ORDER — ONDANSETRON HCL 4 MG/2ML IJ SOLN
INTRAMUSCULAR | Status: AC
  Filled 2020-06-24: qty 2

## 2020-06-24 MED ORDER — FENTANYL CITRATE (PF) 100 MCG/2ML IJ SOLN
INTRAMUSCULAR | Status: AC
  Filled 2020-06-24: qty 2

## 2020-06-24 MED ORDER — METHYLENE BLUE 0.5 % INJ SOLN
INTRAVENOUS | Status: AC
  Filled 2020-06-24: qty 10

## 2020-06-24 MED ORDER — DEXAMETHASONE SODIUM PHOSPHATE 10 MG/ML IJ SOLN
INTRAMUSCULAR | Status: DC | PRN
  Administered 2020-06-24: 5 mg via INTRAVENOUS

## 2020-06-24 MED ORDER — FAMOTIDINE 20 MG PO TABS
ORAL_TABLET | ORAL | Status: AC
  Administered 2020-06-24: 20 mg via ORAL
  Filled 2020-06-24: qty 1

## 2020-06-24 MED ORDER — PROPOFOL 10 MG/ML IV BOLUS
INTRAVENOUS | Status: DC | PRN
  Administered 2020-06-24: 20 mg via INTRAVENOUS
  Administered 2020-06-24: 100 mg via INTRAVENOUS

## 2020-06-24 MED ORDER — CHLORHEXIDINE GLUCONATE CLOTH 2 % EX PADS: 6.0000 | MEDICATED_PAD | Freq: Once | CUTANEOUS | Status: DC

## 2020-06-24 MED ORDER — LIDOCAINE HCL (CARDIAC) PF 100 MG/5ML IV SOSY
PREFILLED_SYRINGE | INTRAVENOUS | Status: DC | PRN
  Administered 2020-06-24: 60 mg via INTRAVENOUS

## 2020-06-24 MED ORDER — PROPOFOL 10 MG/ML IV BOLUS
INTRAVENOUS | Status: AC
  Filled 2020-06-24: qty 20

## 2020-06-24 MED ORDER — FENTANYL CITRATE (PF) 100 MCG/2ML IJ SOLN
INTRAMUSCULAR | Status: DC | PRN
  Administered 2020-06-24 (×5): 25 ug via INTRAVENOUS

## 2020-06-24 MED ORDER — ACETAMINOPHEN 10 MG/ML IV SOLN
INTRAVENOUS | Status: AC
  Filled 2020-06-24: qty 100

## 2020-06-24 MED ORDER — BUPIVACAINE-EPINEPHRINE (PF) 0.5% -1:200000 IJ SOLN
INTRAMUSCULAR | Status: DC | PRN
  Administered 2020-06-24: 20 mL via PERINEURAL
  Administered 2020-06-24: 10 mL via PERINEURAL

## 2020-06-24 MED ORDER — METOPROLOL TARTRATE 5 MG/5ML IV SOLN: 2.0000 mg | Freq: Once | INTRAVENOUS | Status: AC

## 2020-06-24 MED ORDER — TRAMADOL HCL 50 MG PO TABS: 50.0000 mg | ORAL_TABLET | Freq: Four times a day (QID) | ORAL | 0 refills | Status: DC | PRN

## 2020-06-24 MED ORDER — CHLORHEXIDINE GLUCONATE 0.12 % MT SOLN: 15.0000 mL | Freq: Once | OROMUCOSAL | Status: AC

## 2020-06-24 SURGICAL SUPPLY — 57 items
APL PRP STRL LF DISP 70% ISPRP (MISCELLANEOUS) ×1
BINDER BREAST LRG (GAUZE/BANDAGES/DRESSINGS) IMPLANT
BINDER BREAST MEDIUM (GAUZE/BANDAGES/DRESSINGS) IMPLANT
BINDER BREAST XLRG (GAUZE/BANDAGES/DRESSINGS) IMPLANT
BINDER BREAST XXLRG (GAUZE/BANDAGES/DRESSINGS) IMPLANT
BLADE BOVIE TIP EXT 4 (BLADE) ×2 IMPLANT
BLADE SURG 15 STRL SS SAFETY (BLADE) ×4 IMPLANT
BULB RESERV EVAC DRAIN JP 100C (MISCELLANEOUS) IMPLANT
CANISTER SUCT 1200ML W/VALVE (MISCELLANEOUS) ×2 IMPLANT
CHLORAPREP W/TINT 26 (MISCELLANEOUS) ×2 IMPLANT
CNTNR SPEC 2.5X3XGRAD LEK (MISCELLANEOUS)
CONT SPEC 4OZ STER OR WHT (MISCELLANEOUS)
CONT SPEC 4OZ STRL OR WHT (MISCELLANEOUS)
CONTAINER SPEC 2.5X3XGRAD LEK (MISCELLANEOUS) IMPLANT
COVER PROBE FLX POLY STRL (MISCELLANEOUS) ×2 IMPLANT
COVER WAND RF STERILE (DRAPES) ×2 IMPLANT
DEVICE DUBIN SPECIMEN MAMMOGRA (MISCELLANEOUS) ×2 IMPLANT
DRAIN CHANNEL JP 15F RND 16 (MISCELLANEOUS) IMPLANT
DRAPE LAPAROTOMY TRNSV 106X77 (MISCELLANEOUS) ×2 IMPLANT
DRSG GAUZE FLUFF 36X18 (GAUZE/BANDAGES/DRESSINGS) ×2 IMPLANT
DRSG TELFA 3X8 NADH (GAUZE/BANDAGES/DRESSINGS) ×2 IMPLANT
ELECT CAUTERY BLADE TIP 2.5 (TIP) ×2
ELECT REM PT RETURN 9FT ADLT (ELECTROSURGICAL) ×2
ELECTRODE CAUTERY BLDE TIP 2.5 (TIP) ×1 IMPLANT
ELECTRODE REM PT RTRN 9FT ADLT (ELECTROSURGICAL) ×1 IMPLANT
GLOVE SURG ENC MOIS LTX SZ7.5 (GLOVE) ×6 IMPLANT
GLOVE SURG UNDER LTX SZ8 (GLOVE) ×6 IMPLANT
GOWN STRL REUS W/ TWL LRG LVL3 (GOWN DISPOSABLE) ×3 IMPLANT
GOWN STRL REUS W/TWL LRG LVL3 (GOWN DISPOSABLE) ×6
KIT TURNOVER KIT A (KITS) ×2 IMPLANT
LABEL OR SOLS (LABEL) ×2 IMPLANT
MANIFOLD NEPTUNE II (INSTRUMENTS) ×2 IMPLANT
MARGIN MAP 10MM (MISCELLANEOUS) ×2 IMPLANT
NEEDLE HYPO 22GX1.5 SAFETY (NEEDLE) ×2 IMPLANT
NEEDLE HYPO 25X1 1.5 SAFETY (NEEDLE) ×4 IMPLANT
PACK BASIN MINOR ARMC (MISCELLANEOUS) ×2 IMPLANT
RETRACTOR RING XSMALL (MISCELLANEOUS) ×1 IMPLANT
RTRCTR WOUND ALEXIS 13CM XS SH (MISCELLANEOUS) ×2
SHEARS FOC LG CVD HARMONIC 17C (MISCELLANEOUS) IMPLANT
SHEARS HARMONIC 9CM CVD (BLADE) IMPLANT
SLEVE PROBE SENORX GAMMA FIND (MISCELLANEOUS) ×2 IMPLANT
STRIP CLOSURE SKIN 1/2X4 (GAUZE/BANDAGES/DRESSINGS) ×2 IMPLANT
SUT ETHILON 3-0 FS-10 30 BLK (SUTURE) ×2
SUT SILK 2 0 (SUTURE) ×2
SUT SILK 2-0 18XBRD TIE 12 (SUTURE) ×1 IMPLANT
SUT VIC AB 2-0 CT1 27 (SUTURE) ×2
SUT VIC AB 2-0 CT1 TAPERPNT 27 (SUTURE) ×1 IMPLANT
SUT VIC AB 3-0 SH 27 (SUTURE) ×2
SUT VIC AB 3-0 SH 27X BRD (SUTURE) ×1 IMPLANT
SUT VIC AB 4-0 FS2 27 (SUTURE) ×2 IMPLANT
SUT VICRYL+ 3-0 144IN (SUTURE) ×2 IMPLANT
SUTURE EHLN 3-0 FS-10 30 BLK (SUTURE) ×1 IMPLANT
SWABSTK COMLB BENZOIN TINCTURE (MISCELLANEOUS) ×2 IMPLANT
SYR 10ML LL (SYRINGE) ×2 IMPLANT
SYR BULB IRRIG 60ML STRL (SYRINGE) ×2 IMPLANT
TAPE TRANSPORE STRL 2 31045 (GAUZE/BANDAGES/DRESSINGS) IMPLANT
WATER STERILE IRR 1000ML POUR (IV SOLUTION) ×2 IMPLANT

## 2020-06-24 NOTE — OR Nursing (Signed)
Dr. Bary Castilla in to see pt in postop @ 1442.

## 2020-06-24 NOTE — Anesthesia Preprocedure Evaluation (Signed)
Anesthesia Evaluation  Patient identified by MRN, date of birth, ID band Patient awake    Reviewed: Allergy & Precautions, NPO status , Patient's Chart, lab work & pertinent test results  History of Anesthesia Complications Negative for: history of anesthetic complications  Airway Mallampati: II       Dental   Pulmonary neg sleep apnea, neg COPD, Not current smoker, former smoker,           Cardiovascular hypertension, Pt. on medications (-) Past MI and (-) CHF + dysrhythmias Atrial Fibrillation (-) Valvular Problems/Murmurs     Neuro/Psych neg Seizures    GI/Hepatic Neg liver ROS, neg GERD  ,  Endo/Other  neg diabetes  Renal/GU negative Renal ROS     Musculoskeletal   Abdominal   Peds  Hematology   Anesthesia Other Findings   Reproductive/Obstetrics                             Anesthesia Physical Anesthesia Plan  ASA: III  Anesthesia Plan: General   Post-op Pain Management:    Induction: Intravenous  PONV Risk Score and Plan: 3 and Ondansetron and Dexamethasone  Airway Management Planned: LMA  Additional Equipment:   Intra-op Plan:   Post-operative Plan:   Informed Consent: I have reviewed the patients History and Physical, chart, labs and discussed the procedure including the risks, benefits and alternatives for the proposed anesthesia with the patient or authorized representative who has indicated his/her understanding and acceptance.       Plan Discussed with:   Anesthesia Plan Comments:         Anesthesia Quick Evaluation

## 2020-06-24 NOTE — Discharge Instructions (Signed)

## 2020-06-24 NOTE — Transfer of Care (Signed)
Immediate Anesthesia Transfer of Care Note  Patient: Erin [name]  Procedure(s) Performed: BREAST LUMPECTOMY WITH SENTINEL LYMPH NODE BIOPSY (Left )  Patient Location: PACU  Anesthesia Type:General  Level of Consciousness: awake, alert  and confused  Airway & Oxygen Therapy: Patient Spontanous Breathing and Patient connected to face mask oxygen  Post-op Assessment: Report given to RN and Post -op Vital signs reviewed and stable  Post vital signs: Reviewed and stable  Last Vitals:  Vitals Value Taken Time  BP 150/121 06/24/20 1330  Temp 36.9 C 06/24/20 1325  Pulse 102 06/24/20 1331  Resp 11 06/24/20 1331  SpO2 100 % 06/24/20 1331  Vitals shown include unvalidated device data.  Last Pain:  Vitals:   06/24/20 1004  TempSrc: Temporal  PainSc: 0-No pain         Complications: No complications documented.

## 2020-06-24 NOTE — Op Note (Signed)
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  Postoperative diagnosis: Same.  Operative procedure: 1) pectoralis block with Marcaine with epinephrine; 2) wide local excision with tissue transfer, sentinel node biopsy.  Operating surgeon: Hervey Ard, MD.  Anesthesia: General by LMA, Marcaine 0.5% with 1: 200,000 units of epinephrine, 30 cc.  Estimated blood loss: 5 cc.  Clinical note: This 87 year old woman was noted with a palpable mass and core biopsy showed evidence of DCIS.  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  Based on her general good health anticipated longevity it was elected to proceed and sentinel node biopsy at this time as the location of the tumor would preclude biopsy of invasive cancer was identified.  Operative note: The patient underwent general anesthesia and tolerated this well.  4 cc of 0.5% methylene blue was placed in the left retroareolar space and 1 cc placed in the inner aspect of the left upper arm in the event that was necessary to identify the arm lymphatics.  The breast was then cleansed with ChloraPrep and draped.  Under ultrasound guidance 20 cc of the above-noted local anesthesia was used for a block between the pectoralis major and pectoralis minor muscles and then between the pectoralis minor and the serratus muscle.  This was done under ultrasound guidance and was well-tolerated.  Ultrasound was then used to identify the primary tumor mass borders.  Image obtained and placed into the chart.  This was about a 3 cm diameter area.  The remaining local anesthetic was infiltrated well away from the planned incision site in the upper outer quadrant of the left breast.  A curvilinear incision from the 12 to 3 o'clock position was used about 3 cm from the areola.  The skin was incised sharply and then the adipose tissue was elevated off the underlying parenchyma circumferentially for about 4-5 cm  diameter.  The tissue was then excised to and including the underlying pectoralis fascia.  The specimen was orientated and radiograph showed the previously placed clip within the center of the specimen and the calcifications centered within this area.  While the pathology was department was reviewing the specimen attention was turned to the axilla.  Through the wide excision site it was possible to access the axillary envelope.  2 hot, blue lymph nodes were identified and these were sent for routine histology.  These were about 6 mm in diameter.  Counts were approximately 7-800.  The pathologist reported the superior and inferior margins were at the edge of the tumor.  An additional 5 mm of tissue was taken from both locations and the new margins placed on Telfa pad and the specimen sewn to the pad with orientation markings applied.  The breast parenchyma was elevated off the underlying muscle circumferentially and then approximated with interrupted 2-0 figure-of-eight Vicryl sutures.  This obliterated a approximately 12 cm dead space.  The subcutaneous fat was approximated in a perpendicular to the deep tissue line with interrupted 2-0 Vicryl figure-of-eight sutures.  The skin was then approximated with a running 4-0 Vicryl subcuticular suture.  4-0 Vicryl subcuticular running suture was used for the skin.  Benzoin and Steri-Strips followed by fluff gauze and a compressive wrap were applied.  The patient tolerated procedure well and was taken recovery room in stable condition.

## 2020-06-24 NOTE — Anesthesia Procedure Notes (Signed)
Date/Time: 06/24/2020 1:35 PM Performed by: Allean Found, CRNA

## 2020-06-24 NOTE — H&P (Signed)
Erin [name] [id number] 05-05-1930     HPI:  Healthy, active 87 y/o woman with a 3-4 cm area of palpable DCIS.  For wide excision.   Medications Prior to Admission  Medication Sig Dispense Refill Last Dose  . apixaban (ELIQUIS) 2.5 MG TABS tablet Take 1 tablet (2.5 mg total) by mouth 2 (two) times daily. 60 tablet 1 06/21/20  . benazepril (LOTENSIN) 40 MG tablet Take 1 tablet (40 mg total) by mouth daily. 180 tablet 1 06/24/2020 at Unknown time  . Calcium Carbonate-Vitamin D 600-400 MG-UNIT tablet Take 1 tablet by mouth daily.   06/23/2020 at Unknown time  . felodipine (PLENDIL) 2.5 MG 24 hr tablet Take 1 tablet (2.5 mg total) by mouth daily. 90 tablet 1 06/24/2020 at Unknown time  . furosemide (LASIX) 20 MG tablet Take 1 tablet (20 mg total) by mouth daily as needed. 90 tablet 1 06/23/2020 at Unknown time  . metoprolol succinate (TOPROL-XL) 50 MG 24 hr tablet Take 2 tablets (100 mg) by mouth once daily   06/24/2020 at Unknown time  . Multiple Vitamin (MULTIVITAMIN) tablet Take 1 tablet by mouth daily.   06/23/2020 at Unknown time  . Omega-3 Fatty Acids (FISH OIL) 1000 MG CAPS Take 1,000 mg by mouth daily.   06/23/2020 at Unknown time  . rosuvastatin (CRESTOR) 5 MG tablet Take 1 tablet (5 mg total) by mouth daily. 90 tablet 1 06/23/2020 at Unknown time   Allergies  Allergen Reactions  . Erythromycin Other (See Comments)    GI intolerance   . Minocin [Minocycline Hcl] Other (See Comments)    dizziness   Past Medical History:  Diagnosis Date  . A-fib ([place])   . Cancer (HCC)    melanoma - arm left  . Chest pain 07/13/2016  . History of colon polyps   . Hypercholesterolemia   . Hypertension    Past Surgical History:  Procedure Laterality Date  . APPENDECTOMY  1946  . BREAST BIOPSY Left 06/04/2020   [place] bx, vision marker, path pending  . CATARACT EXTRACTION Bilateral   . DILATION AND CURETTAGE OF UTERUS    . LAMINECTOMY  2000  . release of foraminal stenosis  2003   Social History    Socioeconomic History  . Marital status: Widowed    Spouse name: Not on file  . Number of children: 4  . Years of education: Not on file  . Highest education level: Not on file  Occupational History  . Occupation: retired  Tobacco Use  . Smoking status: Former Research scientist (life sciences)  . Smokeless tobacco: Never Used  Vaping Use  . Vaping Use: Never used  Substance and Sexual Activity  . Alcohol use: Yes    Alcohol/week: 0.0 standard drinks    Comment: occassionally  . Drug use: No  . Sexual activity: Never  Other Topics Concern  . Not on file  Social History Narrative  . Not on file   Social Determinants of Health   Financial Resource Strain: Low Risk   . Difficulty of Paying Living Expenses: Not hard at all  Food Insecurity: No Food Insecurity  . Worried About Charity fundraiser in the Last Year: Never true  . Ran Out of Food in the Last Year: Never true  Transportation Needs: No Transportation Needs  . Lack of Transportation (Medical): No  . Lack of Transportation (Non-Medical): No  Physical Activity: Sufficiently Active  . Days of Exercise per Week: 5 days  . Minutes of Exercise per Session: 30  min  Stress: No Stress Concern Present  . Feeling of Stress : Not at all  Social Connections: Unknown  . Frequency of Communication with Friends and Family: More than three times a week  . Frequency of Social Gatherings with Friends and Family: Twice a week  . Attends Religious Services: More than 4 times per year  . Active Member of Clubs or Organizations: Yes  . Attends Archivist Meetings: Not on file  . Marital Status: Not on file  Intimate Partner Violence: Not At Risk  . Fear of Current or Ex-Partner: No  . Emotionally Abused: No  . Physically Abused: No  . Sexually Abused: No   Social History   Social History Narrative  . Not on file     ROS: Negative.     PE: HEENT: Negative. Lungs: Clear. Cardio: RR.   Assessment/Plan:  Proceed with planned wide  excision and SLN biopsy.   Forest Gleason [facility] 06/24/2020

## 2020-06-24 NOTE — Anesthesia Procedure Notes (Signed)
Procedure Name: LMA Insertion Date/Time: 06/24/2020 11:58 AM Performed by: Hedda Slade, CRNA Pre-anesthesia Checklist: Patient identified, Patient being monitored, Timeout performed, Emergency Drugs available and Suction available Patient Re-evaluated:Patient Re-evaluated prior to induction Oxygen Delivery Method: Circle system utilized Preoxygenation: Pre-oxygenation with 100% oxygen Induction Type: IV induction Ventilation: Mask ventilation without difficulty LMA: LMA inserted LMA Size: 3.5 Tube type: Oral Number of attempts: 1 Placement Confirmation: positive ETCO2 and breath sounds checked- equal and bilateral Tube secured with: Tape Dental Injury: Teeth and Oropharynx as per pre-operative assessment

## 2020-06-24 NOTE — Anesthesia Postprocedure Evaluation (Signed)
Anesthesia Post Note  Patient: Erin [name]  Procedure(s) Performed: BREAST LUMPECTOMY WITH SENTINEL LYMPH NODE BIOPSY (Left )  Patient location during evaluation: PACU Anesthesia Type: General Level of consciousness: awake and alert Pain management: pain level controlled Vital Signs Assessment: post-procedure vital signs reviewed and stable Respiratory status: spontaneous breathing and respiratory function stable Cardiovascular status: stable Anesthetic complications: no   No complications documented.   Last Vitals:  Vitals:   06/24/20 1004 06/24/20 1325  BP: 135/83 139/88  Pulse: (!) 107 (!) 105  Resp: 12 20  Temp: (!) 36.3 C 36.9 C  SpO2: 98% 100%    Last Pain:  Vitals:   06/24/20 1004  TempSrc: Temporal  PainSc: 0-No pain                 Aldair Rickel K

## 2020-06-25 ENCOUNTER — Encounter: Payer: Self-pay | Admitting: General Surgery

## 2020-06-27 ENCOUNTER — Other Ambulatory Visit: Payer: Self-pay | Admitting: General Surgery

## 2020-06-27 DIAGNOSIS — Z170 Estrogen receptor positive status [ER+]: Secondary | ICD-10-CM

## 2020-06-27 DIAGNOSIS — C50412 Malignant neoplasm of upper-outer quadrant of left female breast: Secondary | ICD-10-CM

## 2020-07-01 ENCOUNTER — Other Ambulatory Visit: Payer: Self-pay | Admitting: General Surgery

## 2020-07-01 DIAGNOSIS — D0580 Other specified type of carcinoma in situ of unspecified breast: Secondary | ICD-10-CM

## 2020-07-04 ENCOUNTER — Ambulatory Visit (INDEPENDENT_AMBULATORY_CARE_PROVIDER_SITE_OTHER): Payer: PPO

## 2020-07-04 ENCOUNTER — Other Ambulatory Visit: Payer: Self-pay

## 2020-07-04 DIAGNOSIS — I480 Paroxysmal atrial fibrillation: Secondary | ICD-10-CM

## 2020-07-04 LAB — ECHOCARDIOGRAM COMPLETE
AV Mean grad: 3 mmHg
AV Peak grad: 5.4 mmHg
Ao pk vel: 1.16 m/s
Area-P 1/2: 6.32 cm2

## 2020-07-04 MED ORDER — PERFLUTREN LIPID MICROSPHERE
1.0000 mL | INTRAVENOUS | Status: AC | PRN
  Administered 2020-07-04: 2 mL via INTRAVENOUS

## 2020-07-05 ENCOUNTER — Telehealth: Payer: Self-pay | Admitting: *Deleted

## 2020-07-05 ENCOUNTER — Other Ambulatory Visit: Payer: Self-pay | Admitting: Pathology

## 2020-07-05 ENCOUNTER — Inpatient Hospital Stay: Payer: PPO | Admitting: Oncology

## 2020-07-05 NOTE — Telephone Encounter (Signed)
-----   Message from Loel Dubonnet, NP sent at 07/05/2020  7:53 AM EDT ----- Echocardiogram shows normal heart pumping function. No significant valvular abnormalities. Her left atrium (top left chamber) is moderately dilated which is likely due to her paroxysmal atrial fibrillation. Continue current plan of care.

## 2020-07-05 NOTE — Telephone Encounter (Signed)
Patient returning call  Has a question in regards to results

## 2020-07-05 NOTE — Telephone Encounter (Signed)
Attempted to call pt to review echo results. No answer.  Left message on voicemail of results below, ok per DPR to leave detailed message.  Asked pt to call office with any questions.

## 2020-07-05 NOTE — Telephone Encounter (Signed)
Spoke to pt, discussed results and pt verbalized understanding.  Pt's questions answered and she will follow up as scheduled 07/12/20 with Dr. Saunders Revel.

## 2020-07-09 ENCOUNTER — Ambulatory Visit (INDEPENDENT_AMBULATORY_CARE_PROVIDER_SITE_OTHER): Payer: PPO | Admitting: Internal Medicine

## 2020-07-09 ENCOUNTER — Other Ambulatory Visit: Payer: Self-pay

## 2020-07-09 DIAGNOSIS — I10 Essential (primary) hypertension: Secondary | ICD-10-CM

## 2020-07-09 DIAGNOSIS — Z0289 Encounter for other administrative examinations: Secondary | ICD-10-CM

## 2020-07-09 DIAGNOSIS — D0592 Unspecified type of carcinoma in situ of left breast: Secondary | ICD-10-CM

## 2020-07-09 DIAGNOSIS — E785 Hyperlipidemia, unspecified: Secondary | ICD-10-CM

## 2020-07-09 DIAGNOSIS — I4891 Unspecified atrial fibrillation: Secondary | ICD-10-CM | POA: Diagnosis not present

## 2020-07-09 DIAGNOSIS — R739 Hyperglycemia, unspecified: Secondary | ICD-10-CM

## 2020-07-09 DIAGNOSIS — F439 Reaction to severe stress, unspecified: Secondary | ICD-10-CM

## 2020-07-09 NOTE — Progress Notes (Signed)
Patient ID: Erin [name], female   DOB: May 28, 1930, 87 y.o.   MRN: [id number]   Subjective:    Patient ID: Erin [name], female    DOB: 1931/02/12, 87 y.o.   MRN: [id number]  HPI This visit occurred during the SARS-CoV-2 public health emergency.  Safety protocols were in place, including screening questions prior to the visit, additional usage of staff PPE, and extensive cleaning of exam room while observing appropriate contact time as indicated for disinfecting solutions.  Patient here for a scheduled follow up. Recently diagnosed with afib.  On metoprolol and had this titrated up to 188m q day.  Also on eliquis.  Seeing cardiology.  ECHO 07/04/20 - EF 60-65% with moderately dilated left atrium.  She occasionally will notice increased heart racing.  No chest pain.  No significant increased sob.  Still walking.  No cough or congestion.  Recently diagnosed with breast cancer.  S/p breast lumpectomy 06/24/20.  Has done well post surgery.  No significant pain.  Due to see Dr CDonella Stadeand Dr BRogue Bussingtomorrow.  Overall she feels she is doing relatively well.  Eating. No nausea or vomiting. Bowels moving.  Handling stress.    Past Medical History:  Diagnosis Date  . [place]   . Cancer ([facility])    melanoma - arm left  . Chest pain 07/13/2016  . History of colon polyps   . Hypercholesterolemia   . Hypertension    Past Surgical History:  Procedure Laterality Date  . APPENDECTOMY  1946  . BREAST BIOPSY Left 06/04/2020   u[place]abx, vision marker, path pending  . BREAST LUMPECTOMY WITH SENTINEL LYMPH NODE BIOPSY Left 06/24/2020   Procedure: BREAST LUMPECTOMY WITH SENTINEL LYMPH NODE BIOPSY;  Surgeon: BRobert Bellow MD;  Location: [facility] ORS;  Service: General;  Laterality: Left;  . CATARACT EXTRACTION Bilateral   . DILATION AND CURETTAGE OF UTERUS    . LAMINECTOMY  2000  . release of foraminal stenosis  2003   Family History  Problem Relation Age of Onset  . Breast cancer Maternal Aunt   .  Hypertension Mother   . Hypertension Sister   . Heart disease Brother        S/P CABG  . Multiple sclerosis Sister        died 118  Social History   Socioeconomic History  . Marital status: Widowed    Spouse name: Not on file  . Number of children: 4  . Years of education: Not on file  . Highest education level: Not on file  Occupational History  . Occupation: retired  Tobacco Use  . Smoking status: Former SResearch scientist (life sciences) . Smokeless tobacco: Never Used  Vaping Use  . Vaping Use: Never used  Substance and Sexual Activity  . Alcohol use: Yes    Alcohol/week: 0.0 standard drinks    Comment: occassionally  . Drug use: No  . Sexual activity: Never  Other Topics Concern  . Not on file  Social History Narrative  . Not on file   Social Determinants of Health   Financial Resource Strain: Low Risk   . Difficulty of Paying Living Expenses: Not hard at all  Food Insecurity: No Food Insecurity  . Worried About RCharity fundraiserin the Last Year: Never true  . Ran Out of Food in the Last Year: Never true  Transportation Needs: No Transportation Needs  . Lack of Transportation (Medical): No  . Lack of Transportation (Non-Medical): No  Physical Activity:  Sufficiently Active  . Days of Exercise per Week: 5 days  . Minutes of Exercise per Session: 30 min  Stress: No Stress Concern Present  . Feeling of Stress : Not at all  Social Connections: Unknown  . Frequency of Communication with Friends and Family: More than three times a week  . Frequency of Social Gatherings with Friends and Family: Twice a week  . Attends Religious Services: More than 4 times per year  . Active Member of Clubs or Organizations: Yes  . Attends Archivist Meetings: Not on file  . Marital Status: Not on file    Outpatient Encounter Medications as of 07/09/2020  Medication Sig  . apixaban (ELIQUIS) 2.5 MG TABS tablet Take 1 tablet (2.5 mg total) by mouth 2 (two) times daily.  . benazepril  (LOTENSIN) 40 MG tablet Take 1 tablet (40 mg total) by mouth daily.  . Calcium Carbonate-Vitamin D 600-400 MG-UNIT tablet Take 1 tablet by mouth daily.  . felodipine (PLENDIL) 2.5 MG 24 hr tablet Take 1 tablet (2.5 mg total) by mouth daily.  . furosemide (LASIX) 20 MG tablet Take 1 tablet (20 mg total) by mouth daily as needed.  . metoprolol succinate (TOPROL-XL) 50 MG 24 hr tablet Take 2 tablets (100 mg) by mouth once daily  . Multiple Vitamin (MULTIVITAMIN) tablet Take 1 tablet by mouth daily.  . Omega-3 Fatty Acids (FISH OIL) 1000 MG CAPS Take 1,000 mg by mouth daily.  . rosuvastatin (CRESTOR) 5 MG tablet Take 1 tablet (5 mg total) by mouth daily.  . [DISCONTINUED] traMADol (ULTRAM) 50 MG tablet Take 1 tablet (50 mg total) by mouth every 6 (six) hours as needed. (Patient not taking: Reported on 07/09/2020)   No facility-administered encounter medications on file as of 07/09/2020.    Review of Systems  Constitutional: Negative for appetite change and unexpected weight change.  HENT: Negative for congestion and sinus pressure.   Respiratory: Negative for cough, chest tightness and shortness of breath.   Cardiovascular: Negative for chest pain and leg swelling.       Occasional increased heart racing.   Gastrointestinal: Negative for abdominal pain, diarrhea, nausea and vomiting.  Genitourinary: Negative for difficulty urinating and dysuria.  Musculoskeletal: Negative for joint swelling and myalgias.  Skin: Negative for color change and rash.  Neurological: Negative for dizziness, light-headedness and headaches.  Psychiatric/Behavioral: Negative for agitation and dysphoric mood.       Objective:    Physical Exam Vitals reviewed.  Constitutional:      General: She is not in acute distress.    Appearance: Normal appearance.  HENT:     Head: Normocephalic and atraumatic.     Right Ear: External ear normal.     Left Ear: External ear normal.  Eyes:     General: No scleral icterus.        Right eye: No discharge.        Left eye: No discharge.     Conjunctiva/sclera: Conjunctivae normal.  Neck:     Thyroid: No thyromegaly.  Cardiovascular:     Rate and Rhythm: Normal rate. Rhythm irregular.     Comments: Ventricular rate approximately 100. Pulmonary:     Effort: No respiratory distress.     Breath sounds: Normal breath sounds. No wheezing.  Abdominal:     General: Bowel sounds are normal.     Palpations: Abdomen is soft.     Tenderness: There is no abdominal tenderness.  Musculoskeletal:  General: No swelling or tenderness.     Cervical back: Neck supple. No tenderness.  Lymphadenopathy:     Cervical: No cervical adenopathy.  Skin:    Findings: No erythema or rash.  Neurological:     Mental Status: She is alert.  Psychiatric:        Mood and Affect: Mood normal.        Behavior: Behavior normal.     BP 126/74   Pulse 81   Temp (!) 96.2 F (35.7 C)   Resp 16   Ht 5' (1.524 m)   Wt 129 lb 6.4 oz (58.7 kg)   SpO2 99%   BMI 25.27 kg/m  Wt Readings from Last 3 Encounters:  07/09/20 129 lb 6.4 oz (58.7 kg)  06/24/20 129 lb (58.5 kg)  06/21/20 129 lb (58.5 kg)     Lab Results  Component Value Date   WBC 5.7 06/07/2020   HGB 14.4 06/07/2020   HCT 43.8 06/07/2020   PLT 214 06/07/2020   GLUCOSE 92 06/07/2020   CHOL 167 05/13/2020   TRIG 73.0 05/13/2020   HDL 88.80 05/13/2020   LDLCALC 64 05/13/2020   ALT 18 05/13/2020   AST 20 05/13/2020   NA 142 06/07/2020   K 4.6 06/07/2020   CL 105 06/07/2020   CREATININE 0.92 (H) 06/07/2020   BUN 13 06/07/2020   CO2 24 06/07/2020   TSH 0.83 06/07/2020   HGBA1C 5.6 05/13/2020    NM Sentinel Node Inj-No Rpt (Breast)  Result Date: 06/24/2020 Sulfur colloid was injected by the nuclear medicine technologist for melanoma sentinel node.   MM Breast Surgical Specimen  Result Date: 06/24/2020 CLINICAL DATA:  Status post RF tag localized LEFT breast lumpectomy. EXAM: SPECIMEN RADIOGRAPH OF THE  LEFT BREAST COMPARISON:  Previous exam(s). FINDINGS: Status post excision of the LEFT breast. The Rx tag and vision shaped clip are present within the specimen. IMPRESSION: Specimen radiograph of the LEFT breast. Electronically Signed   By: Nolon Nations M.D.   On: 06/24/2020 16:56       Assessment & Plan:   Problem List Items Addressed This Visit    Atrial fibrillation ([place])    Recently diagnosed with afib ([facility]).  Started on eliquis.  Metoprolol increased recently to 186m q day.  Still in afib.  Rate better today.  Recent echo as outlined.  Keep f/u with cardiology.  Continue current medication regimen.        Carcinoma in situ of breast    Recently diagnosed with breast cancer.  S/p breast lumpectomy 06/24/20.  Seeing Dr BBary Castilla  Due to see Dr CDonella Stadeand Dr BRogue Bussingtomorrow.        Encounter for completion of form with patient    Handicap form completed.        Hyperglycemia    Low carb diet and exercise.  Follow met b and a1c.       Hyperlipidemia    Continue crestor.  Low cholesterol diet and exercise.  Follow lipid panel and liver function tests.       Hypertension    Continues on metoprolol.  Recently increased to 1045mq day.  On lotensin q day now.  Pressures doing well.  Continue current medication regimen.  Follow pressures.  Follow metabolic panel.       Stress    Overall appears to be handling things well.  Follow.           ChEinar PheasantMD

## 2020-07-10 ENCOUNTER — Encounter: Payer: Self-pay | Admitting: Internal Medicine

## 2020-07-10 ENCOUNTER — Ambulatory Visit
Admission: RE | Admit: 2020-07-10 | Discharge: 2020-07-10 | Disposition: A | Discharge status: Home / Self Care | Payer: PPO | Source: Ambulatory Visit | Attending: Radiation Oncology | Admitting: Radiation Oncology

## 2020-07-10 ENCOUNTER — Encounter: Payer: Self-pay | Admitting: Radiation Oncology

## 2020-07-10 ENCOUNTER — Inpatient Hospital Stay: Payer: PPO

## 2020-07-10 ENCOUNTER — Inpatient Hospital Stay: Payer: PPO | Attending: Oncology | Admitting: Internal Medicine

## 2020-07-10 VITALS — BP 151/98 | HR 103 | Temp 96.1°F | Resp 16 | Wt 129.0 lb

## 2020-07-10 DIAGNOSIS — E7800 Pure hypercholesterolemia, unspecified: Secondary | ICD-10-CM | POA: Insufficient documentation

## 2020-07-10 DIAGNOSIS — Z7901 Long term (current) use of anticoagulants: Secondary | ICD-10-CM | POA: Diagnosis not present

## 2020-07-10 DIAGNOSIS — Z170 Estrogen receptor positive status [ER+]: Secondary | ICD-10-CM | POA: Insufficient documentation

## 2020-07-10 DIAGNOSIS — I10 Essential (primary) hypertension: Secondary | ICD-10-CM | POA: Diagnosis not present

## 2020-07-10 DIAGNOSIS — C50412 Malignant neoplasm of upper-outer quadrant of left female breast: Secondary | ICD-10-CM | POA: Insufficient documentation

## 2020-07-10 DIAGNOSIS — Z923 Personal history of irradiation: Secondary | ICD-10-CM | POA: Diagnosis not present

## 2020-07-10 DIAGNOSIS — Z87891 Personal history of nicotine dependence: Secondary | ICD-10-CM | POA: Diagnosis not present

## 2020-07-10 DIAGNOSIS — I4891 Unspecified atrial fibrillation: Secondary | ICD-10-CM | POA: Insufficient documentation

## 2020-07-10 DIAGNOSIS — Z79899 Other long term (current) drug therapy: Secondary | ICD-10-CM | POA: Insufficient documentation

## 2020-07-10 DIAGNOSIS — Z803 Family history of malignant neoplasm of breast: Secondary | ICD-10-CM | POA: Diagnosis not present

## 2020-07-10 DIAGNOSIS — Z86010 Personal history of colonic polyps: Secondary | ICD-10-CM | POA: Insufficient documentation

## 2020-07-10 DIAGNOSIS — Z0289 Encounter for other administrative examinations: Secondary | ICD-10-CM | POA: Insufficient documentation

## 2020-07-10 HISTORY — DX: Malignant neoplasm of unspecified site of unspecified female breast: C50.919

## 2020-07-10 NOTE — Assessment & Plan Note (Signed)
Low carb diet and exercise.  Follow met b and a1c.  

## 2020-07-10 NOTE — Assessment & Plan Note (Signed)
Overall appears to be handling things well.  Follow.  ?

## 2020-07-10 NOTE — Consult Note (Signed)
NEW PATIENT EVALUATION  Name: Erin [name]  MRN: [id number]  Date:   07/10/2020     DOB: [birth year]   This 87 y.o. female patient presents to the clinic for initial evaluation of stage 0 (Tis N0 M0) status post wide local excision and sentinel node biopsy.  ER positive  REFERRING PHYSICIAN: Einar Pheasant, MD  CHIEF COMPLAINT:  Chief Complaint  Patient presents with  . Breast Cancer    Initial consultation    DIAGNOSIS: The encounter diagnosis was Malignant neoplasm of upper-outer quadrant of left breast in female, estrogen receptor positive ([place]).   PREVIOUS INVESTIGATIONS:  Mammogram and ultrasound reviewed Pathology report reviewed Clinical notes reviewed  HPI: Patient is a 87 year old female who presented with a palpable mass in her left breast by her PMD Dr. Einar Pheasant.  Mammogram confirmed a highly suspicious 3 cm mass in the upper outer left breast with normal-appearing left axillary lymph nodes by ultrasound.  She underwent a core guided biopsy which was positive for ER positive ductal carcinoma in situ.  Tumor was intermediate grade with sclerosis.  She went on to have a wide local excision and sentinel node biopsy by Dr. Tollie Pizza.  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  Tumor was overall grade 2 with microinvasion only less than or equal to 1 mm.  Extensive intraductal component was positive.  Lymph vascular invasion not identified.  Margin for invasive component was 3 mm DCIS did not involve focally the lateral unifocal region.  2 sentinel lymph nodes were negative for malignancy.  Patient is now referred to both medical oncology and radiation collagen for opinion she is doing well she specifically denies breast tenderness cough or bone pain.  Her incision is well-healed.  PLANNED TREATMENT REGIMEN: Left hypofractionated radiation therapy  PAST MEDICAL HISTORY:  has a past medical history of A-fib ([place]), Breast cancer ([place]), Cancer ([place]),  Chest pain (07/13/2016), History of colon polyps, Hypercholesterolemia, and Hypertension.    PAST SURGICAL HISTORY:  Past Surgical History:  Procedure Laterality Date  . APPENDECTOMY  1946  . BREAST BIOPSY Left 06/04/2020   [place] bx, vision marker, path pending  . BREAST LUMPECTOMY WITH SENTINEL LYMPH NODE BIOPSY Left 06/24/2020   Procedure: BREAST LUMPECTOMY WITH SENTINEL LYMPH NODE BIOPSY;  Surgeon: Robert Bellow, MD;  Location: [facility] ORS;  Service: General;  Laterality: Left;  . CATARACT EXTRACTION Bilateral   . DILATION AND CURETTAGE OF UTERUS    . LAMINECTOMY  2000  . release of foraminal stenosis  2003    FAMILY HISTORY: family history includes Breast cancer in her maternal aunt; Heart disease in her brother; Hypertension in her mother and sister; Multiple sclerosis in her sister.  SOCIAL HISTORY:  reports that she has quit smoking. She has never used smokeless tobacco. She reports current alcohol use. She reports that she does not use drugs.  ALLERGIES: Erythromycin and Minocin [minocycline hcl]  MEDICATIONS:  Current Outpatient Medications  Medication Sig Dispense Refill  . apixaban (ELIQUIS) 2.5 MG TABS tablet Take 1 tablet (2.5 mg total) by mouth 2 (two) times daily. 60 tablet 1  . benazepril (LOTENSIN) 40 MG tablet Take 1 tablet (40 mg total) by mouth daily. 180 tablet 1  . Calcium Carbonate-Vitamin D 600-400 MG-UNIT tablet Take 1 tablet by mouth daily.    . felodipine (PLENDIL) 2.5 MG 24 hr tablet Take 1 tablet (2.5 mg total) by mouth daily. 90 tablet 1  . furosemide (LASIX) 20 MG tablet Take 1 tablet (  20 mg total) by mouth daily as needed. 90 tablet 1  . metoprolol succinate (TOPROL-XL) 50 MG 24 hr tablet Take 2 tablets (100 mg) by mouth once daily    . Multiple Vitamin (MULTIVITAMIN) tablet Take 1 tablet by mouth daily.    . Omega-3 Fatty Acids (FISH OIL) 1000 MG CAPS Take 1,000 mg by mouth daily.    . rosuvastatin (CRESTOR) 5 MG tablet Take 1 tablet (5 mg total) by  mouth daily. 90 tablet 1   No current facility-administered medications for this encounter.    ECOG PERFORMANCE STATUS:  0 - Asymptomatic  REVIEW OF SYSTEMS: Patient denies any weight loss, fatigue, weakness, fever, chills or night sweats. Patient denies any loss of vision, blurred vision. Patient denies any ringing  of the ears or hearing loss. No irregular heartbeat. Patient denies heart murmur or history of fainting. Patient denies any chest pain or pain radiating to her upper extremities. Patient denies any shortness of breath, difficulty breathing at night, cough or hemoptysis. Patient denies any swelling in the lower legs. Patient denies any nausea vomiting, vomiting of blood, or coffee ground material in the vomitus. Patient denies any stomach pain. Patient states has had normal bowel movements no significant constipation or diarrhea. Patient denies any dysuria, hematuria or significant nocturia. Patient denies any problems walking, swelling in the joints or loss of balance. Patient denies any skin changes, loss of hair or loss of weight. Patient denies any excessive worrying or anxiety or significant depression. Patient denies any problems with insomnia. Patient denies excessive thirst, polyuria, polydipsia. Patient denies any swollen glands, patient denies easy bruising or easy bleeding. Patient denies any recent infections, allergies or URI. Patient "s visual fields have not changed significantly in recent time.   PHYSICAL EXAM: BP (!) 151/98   Pulse (!) 103   Temp (!) 96.1 F (35.6 C)   Resp 16   Wt 129 lb (58.5 kg)   SpO2 97%   BMI 25.19 kg/m  Patient is status post wide local excision and sentinel node biopsy of the left breast both incisions are well-healed no dominant masses noted in either breast.  No axillary or supraclavicular adenopathy is identified.  Well-developed well-nourished patient in NAD. HEENT reveals PERLA, EOMI, discs not visualized.  Oral cavity is clear. No oral  mucosal lesions are identified. Neck is clear without evidence of cervical or supraclavicular adenopathy. Lungs are clear to A&P. Cardiac examination is essentially unremarkable with regular rate and rhythm without murmur rub or thrill. Abdomen is benign with no organomegaly or masses noted. Motor sensory and DTR levels are equal and symmetric in the upper and lower extremities. Cranial nerves II through XII are grossly intact. Proprioception is intact. No peripheral adenopathy or edema is identified. No motor or sensory levels are noted. Crude visual fields are within normal range.  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  I would treat hypofractionated radiation therapy to her left breast over 3 weeks.  Would also boost her scar another 1600 centigrade based on the close margin.  Risks and benefits of treatment including skin reaction fatigue alteration of blood counts possible inclusion of superficial lung all were described  in detail to the patient.  I personally set up and ordered CT simulation for early next week.  Patient will be seeing Dr. B today for possible recommendation of antiestrogen therapy.  Patient comprehends my recommendations well.  I would like to take this opportunity to thank you for allowing me to participate in the care of your patient.Noreene Filbert, MD

## 2020-07-10 NOTE — Assessment & Plan Note (Signed)
Continue crestor.  Low cholesterol diet and exercise. Follow lipid panel and liver function tests.   

## 2020-07-10 NOTE — Assessment & Plan Note (Signed)
Handicap form completed. 

## 2020-07-10 NOTE — Assessment & Plan Note (Signed)
Recently diagnosed with breast cancer.  S/p breast lumpectomy 06/24/20.  Seeing Dr Bary Castilla.  Due to see Dr Donella Stade and Dr Rogue Bussing tomorrow.

## 2020-07-10 NOTE — Assessment & Plan Note (Signed)
Recently diagnosed with afib ([facility]).  Started on eliquis.  Metoprolol increased recently to 100mg  q day.  Still in afib.  Rate better today.  Recent echo as outlined.  Keep f/u with cardiology.  Continue current medication regimen.

## 2020-07-10 NOTE — Assessment & Plan Note (Signed)
Continues on metoprolol.  Recently increased to 100mg  q day.  On lotensin q day now.  Pressures doing well.  Continue current medication regimen.  Follow pressures.  Follow metabolic panel.

## 2020-07-10 NOTE — Assessment & Plan Note (Addendum)
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 STAGE-0- pTispN0; ER/PR-POSITIVE Her 2 NEGATIVE.  DCIS margin-positive/unifocal.  S/p lumpectomy.  #Discussed that overall goal for therapy is cure.  Given the DCIS positive margin/and patient's excellent performance status would recommend with postlumpectomy radiation.  No role for chemotherapy.  #Adjuvant/prophylactic antiestrogen therapy could be considered given the small focus of microinvasive cancer/DCIS.  However, this has to be balanced in the context of the patient's age/risk of recurrence/intolerance.  I would have a very small threshold of discontinuation if patient agrees to go on antiestrogen therapy.  We will discuss further at next visit.  Thank you Dr.Byrnett for allowing me to participate in the care of your pleasant patient. Please do not hesitate to contact me with questions or concerns in the interim.  # DISPOSITION: # Follow up in 2 months-MD; no labs- Dr.B

## 2020-07-10 NOTE — Progress Notes (Signed)
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  Arising from the papillary carcinoma is a single focus of microinvasive  carcinoma, measuring 0.44 mm. ]  Histologic grade (Nottingham Histologic Score)                       Glandular (Acinar)/Tubular Differentiation: 3                       Nuclear Pleomorphism: 2                       Mitotic Rate: 1                       Overall Grade: 2  Tumor Size: Microinvasion only (less than or equal to 1.0 mm)  Ductal carcinoma in situ (DCIS): Present       Positive for extensive intraductal component (EIC).  Lymphovascular Invasion: Not identified   ER/PR- POSITIVE; Her-2 negative.   # A.fib/eliquis [Dr.End];    Carcinoma of upper-outer quadrant of left breast in female, estrogen receptor positive ([place])  07/10/2020 Initial Diagnosis   Carcinoma of upper-outer quadrant of left breast in female, estrogen receptor positive ([place])     HISTORY OF PRESENTING ILLNESS:  Erin [name] 87 y.o.  female female with no prior history of breast cancer/or malignancies has been referred to [place] for further evaluation recommendations for new diagnosis of breast cancer.  Pt stopped mammogram 5 years ago, per pt preference. Noted to palpable mass in left breast which  led to diagnostic mammogram/ultrasound/followed by biopsy-as summarized above.  Patient on the mammogram was found to have 3 cm mass in the upper outer left breast with normal axillary lymph nodes.  Patient underwent core biopsy that was  ER ductal carcinoma in situ.  Patient went on to have wide local excision with sentinel lymph node biopsy.  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  DCIS less than 1 mm margin.  Patient denies any wound healing issues.  Healing well.   Review of Systems  Constitutional: Negative for chills, diaphoresis, fever, malaise/fatigue and weight loss.  HENT: Negative for nosebleeds and sore throat.   Eyes: Negative for double vision.  Respiratory: Negative for cough, hemoptysis, sputum production, shortness of breath and wheezing.   Cardiovascular: Negative for chest pain, palpitations, orthopnea and leg swelling.  Gastrointestinal: Negative for abdominal pain, blood in stool, constipation, diarrhea, heartburn, melena, nausea and vomiting.  Genitourinary: Negative for dysuria, frequency and urgency.  Musculoskeletal: Negative for back pain and joint pain.  Skin: Negative.  Negative for itching and rash.  Neurological: Negative for dizziness, tingling, focal weakness, weakness and headaches.  Endo/Heme/Allergies: Does not bruise/bleed easily.  Psychiatric/Behavioral: Negative for depression. The patient is not nervous/anxious and does not have insomnia.      MEDICAL HISTORY:  Past Medical History:  Diagnosis Date  . A-fib ([place])   . Breast cancer ([place])   . Cancer ([facility])    melanoma - arm left  .  Chest pain 07/13/2016  . History of colon polyps   . Hypercholesterolemia   . Hypertension     SURGICAL HISTORY: Past Surgical History:  Procedure Laterality Date  . APPENDECTOMY  1946  . BREAST BIOPSY Left 06/04/2020   [place] bx, vision marker, path pending  . BREAST LUMPECTOMY WITH SENTINEL LYMPH NODE BIOPSY Left 06/24/2020   Procedure: BREAST LUMPECTOMY WITH SENTINEL LYMPH NODE BIOPSY;  Surgeon: Robert Bellow, MD;  Location: [facility] ORS;  Service: General;  Laterality: Left;  . CATARACT EXTRACTION Bilateral   . DILATION AND CURETTAGE OF UTERUS    .  LAMINECTOMY  2000  . release of foraminal stenosis  2003    SOCIAL HISTORY: Social History   Socioeconomic History  . Marital status: Widowed    Spouse name: Not on file  . Number of children: 4  . Years of education: Not on file  . Highest education level: Not on file  Occupational History  . Occupation: retired  Tobacco Use  . Smoking status: Former Research scientist (life sciences)  . Smokeless tobacco: Never Used  Vaping Use  . Vaping Use: Never used  Substance and Sexual Activity  . Alcohol use: Yes    Alcohol/week: 0.0 standard drinks    Comment: occassionally  . Drug use: No  . Sexual activity: Never  Other Topics Concern  . Not on file  Social History Narrative  . Not on file   Social Determinants of Health   Financial Resource Strain: Low Risk   . Difficulty of Paying Living Expenses: Not hard at all  Food Insecurity: No Food Insecurity  . Worried About Charity fundraiser in the Last Year: Never true  . Ran Out of Food in the Last Year: Never true  Transportation Needs: No Transportation Needs  . Lack of Transportation (Medical): No  . Lack of Transportation (Non-Medical): No  Physical Activity: Sufficiently Active  . Days of Exercise per Week: 5 days  . Minutes of Exercise per Session: 30 min  Stress: No Stress Concern Present  . Feeling of Stress : Not at all  Social Connections: Unknown  . Frequency of Communication with Friends and Family: More than three times a week  . Frequency of Social Gatherings with Friends and Family: Twice a week  . Attends Religious Services: More than 4 times per year  . Active Member of Clubs or Organizations: Yes  . Attends Archivist Meetings: Not on file  . Marital Status: Not on file  Intimate Partner Violence: Not At Risk  . Fear of Current or Ex-Partner: No  . Emotionally Abused: No  . Physically Abused: No  . Sexually Abused: No    FAMILY HISTORY: Family History  Problem Relation Age of Onset  . Breast cancer Maternal  Aunt   . Hypertension Mother   . Hypertension Sister   . Heart disease Brother        S/P CABG  . Multiple sclerosis Sister        died 79    ALLERGIES:  is allergic to erythromycin and minocin [minocycline hcl].  MEDICATIONS:  Current Outpatient Medications  Medication Sig Dispense Refill  . benazepril (LOTENSIN) 40 MG tablet Take 1 tablet (40 mg total) by mouth daily. 180 tablet 1  . Calcium Carbonate-Vitamin D 600-400 MG-UNIT tablet Take 1 tablet by mouth daily.    . felodipine (PLENDIL) 2.5 MG 24 hr tablet Take 1 tablet (2.5 mg total) by mouth daily. 90 tablet 1  . furosemide (LASIX) 20  MG tablet Take 1 tablet (20 mg total) by mouth daily as needed. 90 tablet 1  . metoprolol succinate (TOPROL-XL) 50 MG 24 hr tablet Take 2 tablets (100 mg) by mouth once daily    . Multiple Vitamin (MULTIVITAMIN) tablet Take 1 tablet by mouth daily.    . Omega-3 Fatty Acids (FISH OIL) 1000 MG CAPS Take 1,000 mg by mouth daily.    . rosuvastatin (CRESTOR) 5 MG tablet Take 1 tablet (5 mg total) by mouth daily. 90 tablet 1  . apixaban (ELIQUIS) 2.5 MG TABS tablet Take 1 tablet (2.5 mg total) by mouth 2 (two) times daily. 60 tablet 1   No current facility-administered medications for this visit.      Marland Kitchen  PHYSICAL EXAMINATION: ECOG PERFORMANCE STATUS: 0 - Asymptomatic  Vitals:   07/10/20 1355  BP: (!) 151/98  Pulse: (!) 103  Resp: 16  Temp: (!) 96.1 F (35.6 C)  SpO2: 97%   Filed Weights   07/10/20 1355  Weight: 129 lb (58.5 kg)    Physical Exam HENT:     Head: Normocephalic and atraumatic.     Mouth/Throat:     Pharynx: No oropharyngeal exudate.  Eyes:     Pupils: Pupils are equal, round, and reactive to light.  Cardiovascular:     Rate and Rhythm: Normal rate and regular rhythm.  Pulmonary:     Effort: Pulmonary effort is normal. No respiratory distress.     Breath sounds: Normal breath sounds. No wheezing.  Abdominal:     General: Bowel sounds are normal. There is no  distension.     Palpations: Abdomen is soft. There is no mass.     Tenderness: There is no abdominal tenderness. There is no guarding or rebound.  Musculoskeletal:        General: No tenderness. Normal range of motion.     Cervical back: Normal range of motion and neck supple.  Skin:    General: Skin is warm.  Neurological:     Mental Status: She is alert and oriented to person, place, and time.  Psychiatric:        Mood and Affect: Affect normal.      LABORATORY DATA:  I have reviewed the data as listed Lab Results  Component Value Date   WBC 5.7 06/07/2020   HGB 14.4 06/07/2020   HCT 43.8 06/07/2020   MCV 97.6 06/07/2020   PLT 214 06/07/2020   Recent Labs    11/07/19 0803 05/13/20 0801 06/07/20 1527  NA 138 138 142  K 4.1 4.1 4.6  CL 102 103 105  CO2 _0 GLUCOSE 94 98 92  BUN _1 CREATININE 0.85 0.89 0.92*  CALCIUM 10.2 10.4 10.6*  PROT 6.3 6.7  --   ALBUMIN 4.2 4.2  --   AST 18 20  --   ALT 14 18  --   ALKPHOS 52 51  --   BILITOT 0.8 0.7  --   BILIDIR 0.1 0.1  --     RADIOGRAPHIC STUDIES: I have personally reviewed the radiological images as listed and agreed with the findings in the report. NM Sentinel Node Inj-No Rpt (Breast)  Result Date: 06/24/2020 Sulfur colloid was injected by the nuclear medicine technologist for melanoma sentinel node.   MM Breast Surgical Specimen  Result Date: 06/24/2020 CLINICAL DATA:  Status post RF tag localized LEFT breast lumpectomy. EXAM: SPECIMEN RADIOGRAPH OF THE LEFT BREAST COMPARISON:  Previous exam(s). FINDINGS: Status post excision  of the LEFT breast. The Rx tag and vision shaped clip are present within the specimen. IMPRESSION: Specimen radiograph of the LEFT breast. Electronically Signed   By: Nolon Nations M.D.   On: 06/24/2020 16:56   ECHOCARDIOGRAM COMPLETE  Result Date: 07/04/2020    ECHOCARDIOGRAM REPORT   Patient Name:   Erin [name] Date of Exam: 07/04/2020 Medical Rec #:  [id number]      Height:       60.0 in Accession #:    [id number]    Weight:       129.0 lb Date of Birth:  06-19-1930      BSA:          1.549 m Patient Age:    67 years      BP:           118/80 mmHg Patient Gender: F             HR:           110 bpm. Exam Location:  [facility] Procedure: 2D Echo, Cardiac Doppler, Color Doppler and Intracardiac            Opacification Agent Indications:    I48.91* Unspeicified atrial fibrillation  History:        Patient has prior history of Echocardiogram examinations, most                 recent 08/06/2016. Arrythmias:Atrial Fibrillation, PVC and PAC,                 Signs/Symptoms:Chest Pain; Risk Factors:Hypertension,                 Dyslipidemia and Former Smoker.  Sonographer:    Pilar Jarvis RDMS, RVT, RDCS Referring Phys: [id number] [facility]  Sonographer Comments: Extremely limited acoustic windows and Afib with RVR. Ignacia Bayley reviewed rate and rhythm and suggested patient be released given the stability of the patient's afib IMPRESSIONS  1. Left ventricular ejection fraction, by estimation, is 60 to 65%. The left ventricle has normal function. The left ventricle has no regional wall motion abnormalities. Left ventricular diastolic parameters are indeterminate.  2. Right ventricular systolic function is normal. The right ventricular size is normal.  3. Left atrial size was moderately dilated. FINDINGS  Left Ventricle: Left ventricular ejection fraction, by estimation, is 60 to 65%. The left ventricle has normal function. The left ventricle has no regional wall motion abnormalities. Definity contrast agent was given IV to delineate the left ventricular  endocardial borders. The left ventricular internal cavity size was normal in size. There is no left ventricular hypertrophy. Left ventricular diastolic parameters are indeterminate. Right Ventricle: The right ventricular size is normal. No increase in right ventricular wall thickness. Right ventricular systolic function is normal. Left  Atrium: Left atrial size was moderately dilated. Right Atrium: Right atrial size was normal in size. Pericardium: There is no evidence of pericardial effusion. Mitral Valve: The mitral valve is normal in structure. No evidence of mitral valve regurgitation. No evidence of mitral valve stenosis. Tricuspid Valve: The tricuspid valve is normal in structure. Tricuspid valve regurgitation is not demonstrated. No evidence of tricuspid stenosis. Aortic Valve: The aortic valve is normal in structure. Aortic valve regurgitation is not visualized. No aortic stenosis is present. Aortic valve mean gradient measures 3.0 mmHg. Aortic valve peak gradient measures 5.4 mmHg. Pulmonic Valve: The pulmonic valve was normal in structure. Pulmonic valve regurgitation is not visualized. No evidence of pulmonic stenosis. Aorta: The aortic  root is normal in size and structure. Venous: The inferior vena cava is normal in size with greater than 50% respiratory variability, suggesting right atrial pressure of 3 mmHg. IAS/Shunts: No atrial level shunt detected by color flow Doppler.  RIGHT VENTRICLE             IVC RV S prime:     10.60 cm/s  IVC diam: 1.60 cm LEFT ATRIUM           Index LA Vol (A4C): 87.8 ml 56.67 ml/m  AORTIC VALVE                   PULMONIC VALVE AV Vmax:           116.00 cm/s PV Vmax:       0.55 m/s AV Vmean:          79.900 cm/s PV Peak grad:  1.2 mmHg AV VTI:            0.216 m AV Peak Grad:      5.4 mmHg AV Mean Grad:      3.0 mmHg LVOT Vmax:         91.40 cm/s LVOT Vmean:        58.300 cm/s LVOT VTI:          0.155 m LVOT/AV VTI ratio: 0.72 MITRAL VALVE                TRICUSPID VALVE MV Area (PHT): 6.32 cm     TR Peak grad:   26.8 mmHg MV Decel Time: 120 msec     TR Vmax:        259.00 cm/s MV E velocity: 115.00 cm/s                             SHUNTS                             Systemic VTI: 0.16 m Ida Rogue MD Electronically signed by Ida Rogue MD Signature Date/Time: 07/04/2020/5:28:31 PM    Final      ASSESSMENT & PLAN:   Carcinoma of upper-outer quadrant of left breast in female, estrogen receptor positive ([place]) # 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 STAGE-0- pTispN0; ER/PR-POSITIVE Her 2 NEGATIVE.  DCIS margin-positive/unifocal.  S/p lumpectomy.  #Discussed that overall goal for therapy is cure.  Given the DCIS positive margin/and patient's excellent performance status would recommend with postlumpectomy radiation.  No role for chemotherapy.  #Adjuvant/prophylactic antiestrogen therapy could be considered given the small focus of microinvasive cancer/DCIS.  However, this has to be balanced in the context of the patient's age/risk of recurrence/intolerance.  I would have a very small threshold of discontinuation if patient agrees to go on antiestrogen therapy.  We will discuss further at next visit.  Thank you Dr.Byrnett for allowing me to participate in the care of your pleasant patient. Please do not hesitate to contact me with questions or concerns in the interim.  # DISPOSITION: # Follow up in 2 months-MD; no labs- Dr.B  All questions were answered. The patient/family knows to call the clinic with any problems, questions or concerns.    Cammie Sickle, MD 07/19/2020 7:32 AM

## 2020-07-11 ENCOUNTER — Telehealth: Payer: Self-pay | Admitting: Internal Medicine

## 2020-07-11 DIAGNOSIS — C50412 Malignant neoplasm of upper-outer quadrant of left female breast: Secondary | ICD-10-CM | POA: Diagnosis not present

## 2020-07-11 DIAGNOSIS — Z170 Estrogen receptor positive status [ER+]: Secondary | ICD-10-CM | POA: Diagnosis not present

## 2020-07-11 DIAGNOSIS — N6489 Other specified disorders of breast: Secondary | ICD-10-CM | POA: Diagnosis not present

## 2020-07-11 MED ORDER — APIXABAN 2.5 MG PO TABS: 2.5000 mg | ORAL_TABLET | Freq: Two times a day (BID) | ORAL | 1 refills | Status: DC

## 2020-07-11 NOTE — Telephone Encounter (Signed)
Patient called in for refill for apixaban (ELIQUIS) 2.5 MG TABS tablet

## 2020-07-12 ENCOUNTER — Encounter: Payer: Self-pay | Admitting: Internal Medicine

## 2020-07-12 ENCOUNTER — Other Ambulatory Visit: Payer: Self-pay

## 2020-07-12 ENCOUNTER — Ambulatory Visit: Payer: PPO | Admitting: Internal Medicine

## 2020-07-12 VITALS — BP 130/84 | HR 109 | Ht 60.0 in | Wt 128.0 lb

## 2020-07-12 DIAGNOSIS — I4819 Other persistent atrial fibrillation: Secondary | ICD-10-CM

## 2020-07-12 DIAGNOSIS — I10 Essential (primary) hypertension: Secondary | ICD-10-CM

## 2020-07-12 NOTE — Patient Instructions (Signed)
Medication Instructions:  Your physician recommends that you continue on your current medications as directed. Please refer to the Current Medication list given to you today.  *If you need a refill on your cardiac medications before your next appointment, please call your pharmacy*   Lab Work: Your physician recommends that you return for lab work (bmp, cbc) on 07/26/20  Please have your labs drawn at the [facility] medical mall. No appt needed. Hours are 7am-6pm.  You will need a COVID test on 07/26/20. Please report to the [facility] medical arts pre-admit testing office on the 1st floor. It is the first office to the right at the entrance of the building.   If you have labs (blood work) drawn today and your tests are completely normal, you will receive your results only by: Marland Kitchen MyChart Message (if you have MyChart) OR . A paper copy in the mail If you have any lab test that is abnormal or we need to change your treatment, we will call you to review the results.   Testing/Procedures: Your physician has recommended that you have a Cardioversion (DCCV). Electrical Cardioversion uses a jolt of electricity to your heart either through paddles or wired patches attached to your chest. This is a controlled, usually prescheduled, procedure. Defibrillation is done under light anesthesia in the hospital, and you usually go home the day of the procedure. This is done to get your heart back into a normal rhythm. You are not awake for the procedure. Please see the instruction sheet given to you today.     Follow-Up: At [facility], you and your health needs are our priority.  As part of our continuing mission to provide you with exceptional heart care, we have created designated Provider Care Teams.  These Care Teams include your primary Cardiologist (physician) and Advanced Practice Providers (APPs -  Physician Assistants and Nurse Practitioners) who all work together to provide you with the care you need, when  you need it.  We recommend signing up for the patient portal called "MyChart".  Sign up information is provided on this After Visit Summary.  MyChart is used to connect with patients for Virtual Visits (Telemedicine).  Patients are able to view lab/test results, encounter notes, upcoming appointments, etc.  Non-urgent messages can be sent to your provider as well.   To learn more about what you can do with MyChart, go to [email or web address].    Your next appointment:   4 week(s)  The format for your next appointment:   In Person  Provider:   You may see Nelva Bush, MD or one of the following Advanced Practice Providers on your designated Care Team:    Murray Hodgkins, NP  Christell Faith, PA-C  Marrianne Mood, PA-C  Cadence Kathlen Mody, [place]  Laurann Montana, NP    Other Instructions You are scheduled for a Cardioversion on 07/30/20 with Dr. Saunders Revel Please arrive at the [facility] at 6:30 a.m. on the day of your procedure.  DIET INSTRUCTIONS:  Nothing to eat or drink after midnight except your medications with a              sip of water.         1) Labs and COVID test on 07/26/20. Labs at the medical mall and COVID test at the medical art building.  2) Medications:  YOU MAY TAKE ALL of your remaining medications with a small amount of water.  3) Must have a responsible person to drive you home.  4) Bring a current list of your medications and current insurance cards.    If you have any questions after you get home, please call the office at [phone]

## 2020-07-12 NOTE — Progress Notes (Signed)
Follow-up Outpatient Visit Date: 07/12/2020  Primary Care Provider: Einar Pheasant, [address] [place] [address]  Chief Complaint: Follow-up atrial fibrillation.  HPI:  Ms. Erin [name] is a 87 y.o. female with history of persistent atrial fibrillation, hypertension, hyperlipidemia, and breast cancer hypertension, who presents for follow-up of atrial fibrillation.  She has a long history of atrial arrhythmias without conclusive evidence of atrial fibrillation until EKG at her PCPs office in March confirmed atrial fibrillation.  At her most recent visit 3 weeks ago with Cadence Kathlen Mody, PA, Erin [name] was asymptomatic but remained in atrial fibrillation with a ventricular rate of 103 bpm.  She was planning to undergo lumpectomy and lymph node dissection the next week.  Metoprolol succinate was increased to 100 mg daily to improve rate control.  Today, Erin [name] reports that she feels relatively well though she is more fatigued than usual.  She attributes this to a combination of her atrial fibrillation and recent lumpectomy.  She will likely begin radiation treatment to the left breast in the next few weeks.  She denies chest pain, shortness of breath, and lightheadedness.  She has sporadic palpitations during which it feels like her heart is beating faster than usual.  She remains compliant with her medications and restarted apixaban on the evening of 06/27/2020 after her left breast lumpectomy.  She does not feel significantly different with escalation of metoprolol after her last visit.  --------------------------------------------------------------------------------------------------  Cardiovascular History & Procedures: Cardiovascular Problems:  Persistent atrial fibrillation  Atypical chest pain  Risk Factors:  Hypertension, hyperlipidemia, and age greater than 71  Cath/PCI:  None  CV Surgery:  None  EP Procedures and Devices:  Event monitor  (10/07/16):Predominantly sinus rhythm with frequent PACs and occasional PVCs. Single episode of NSVT. Multiple atrial runs lasting up to 19.4 seconds. No sustained arrhythmias.  Event monitor (07/07/16): Predominantly sinus rhythm with 20 episodes of narrow complex tachycardia and irregularity consistent with atrial fibrillation (longest episode noted approximately 12 seconds). Frequent PACs and rare PVCs were noted.  Non-Invasive Evaluation(s):  TTE (07/04/2020): Normal LV size and wall thickness.  LVEF 60-65% with normal wall motion.  Normal RV size and function.  Moderate left atrial enlargement.  No significant valvular abnormality.  TTE (08/06/16): Normal LV size with LVEF of 55-65%. Normal wall motion with grade 1 diastolic dysfunction. Mild left atrial enlargement. Normal RV size and function.  Exercise MPI (07/13/16): Low risk study without ischemia or scar. LVEF greater than 65%. Decreased exercise capacity, exercising 4 minutes, zero seconds, achieving 117 bpm (90% MPHR; 4.6 METs).   Recent CV Pertinent Labs: Lab Results  Component Value Date   CHOL 167 05/13/2020   HDL 88.80 05/13/2020   LDLCALC 64 05/13/2020   TRIG 73.0 05/13/2020   CHOLHDL 2 05/13/2020   K 4.6 06/07/2020   K 3.5 12/21/2011   MG 2.0 06/07/2020   BUN 13 06/07/2020   CREATININE 0.92 (H) 06/07/2020    Past medical and surgical history were reviewed and updated in EPIC.  Current Meds  Medication Sig  . apixaban (ELIQUIS) 2.5 MG TABS tablet Take 1 tablet (2.5 mg total) by mouth 2 (two) times daily.  . benazepril (LOTENSIN) 40 MG tablet Take 1 tablet (40 mg total) by mouth daily.  . Calcium Carbonate-Vitamin D 600-400 MG-UNIT tablet Take 1 tablet by mouth daily.  . felodipine (PLENDIL) 2.5 MG 24 hr tablet Take 1 tablet (2.5 mg total) by mouth daily.  . furosemide (LASIX) 20 MG tablet  Take 1 tablet (20 mg total) by mouth daily as needed.  . metoprolol succinate (TOPROL-XL) 50 MG 24 hr tablet Take 2 tablets (100  mg) by mouth once daily  . Multiple Vitamin (MULTIVITAMIN) tablet Take 1 tablet by mouth daily.  . Omega-3 Fatty Acids (FISH OIL) 1000 MG CAPS Take 1,000 mg by mouth daily.  . rosuvastatin (CRESTOR) 5 MG tablet Take 1 tablet (5 mg total) by mouth daily.    Allergies: Erythromycin and Minocin [minocycline hcl]  Social History   Tobacco Use  . Smoking status: Former Smoker  . Smokeless tobacco: Never Used  Vaping Use  . Vaping Use: Never used  Substance Use Topics  . Alcohol use: Yes    Alcohol/week: 0.0 standard drinks    Comment: occassionally  . Drug use: No    Family History  Problem Relation Age of Onset  . Breast cancer Maternal Aunt   . Hypertension Mother   . Hypertension Sister   . Heart disease Brother        S/P CABG  . Multiple sclerosis Sister        died 1976    Review of Systems: A 12-system review of systems was performed and was negative except as noted in the HPI.  --------------------------------------------------------------------------------------------------  Physical Exam: BP 130/84 (BP Location: Left Arm, Patient Position: Sitting, Cuff Size: Normal)   Pulse (!) 109   Ht 5' (1.524 m)   Wt 128 lb (58.1 kg)   SpO2 96%   BMI 25.00 kg/m   General:  NAD. Neck: No JVD or HJR. Lungs: Clear to auscultation bilaterally without wheezes or crackles. Heart: Irregularly irregular rhythm with 1/6 systolic murmur.  No rubs or gallops. Abdomen: Soft, nontender, nondistended. Extremities: No lower extremity edema.  EKG: Atrial fibrillation with PVCs versus aberrancy (ventricular rate 109 bpm.  No significant change from prior tracing on 06/21/2020.  Lab Results  Component Value Date   WBC 5.7 06/07/2020   HGB 14.4 06/07/2020   HCT 43.8 06/07/2020   MCV 97.6 06/07/2020   PLT 214 06/07/2020    Lab Results  Component Value Date   NA 142 06/07/2020   K 4.6 06/07/2020   CL 105 06/07/2020   CO2 24 06/07/2020   BUN 13 06/07/2020   CREATININE 0.92  (H) 06/07/2020   GLUCOSE 92 06/07/2020   ALT 18 05/13/2020    Lab Results  Component Value Date   CHOL 167 05/13/2020   HDL 88.80 05/13/2020   LDLCALC 64 05/13/2020   TRIG 73.0 05/13/2020   CHOLHDL 2 05/13/2020    --------------------------------------------------------------------------------------------------  ASSESSMENT AND PLAN: Persistent atrial fibrillation: EKG today shows continued atrial fibrillation with borderline ventricular rate control.  Given soft blood pressures at home, we have agreed to defer medication changes and will proceed with cardioversion on 07/30/2020, at which time Erin [name] will have completed greater than 4 weeks of uninterrupted anticoagulation.  We will continue with apixaban 2.5 mg twice daily based on age and weight.  Hypertension: Blood pressure normal today but often lower at home.  No medication changes at this time.  Shared Decision Making/Informed Consent The risks (stroke, cardiac arrhythmias rarely resulting in the need for a temporary or permanent pacemaker, skin irritation or burns and complications associated with conscious sedation including aspiration, arrhythmia, respiratory failure and death), benefits (restoration of normal sinus rhythm) and alternatives of a direct current cardioversion were explained in detail to Erin [name] and she agrees to proceed.   Follow-up: Return to clinic in   4 weeks.  Odile Veloso, MD 07/12/2020 10:30 AM  

## 2020-07-12 NOTE — H&P (View-Only) (Signed)
Follow-up Outpatient Visit Date: 07/12/2020  Primary Care Provider: Einar Pheasant, [address] [place] [address]  Chief Complaint: Follow-up atrial fibrillation.  HPI:  Erin [name] is a 87 y.o. female with history of persistent atrial fibrillation, hypertension, hyperlipidemia, and breast cancer hypertension, who presents for follow-up of atrial fibrillation.  She has a long history of atrial arrhythmias without conclusive evidence of atrial fibrillation until EKG at her PCPs office in March confirmed atrial fibrillation.  At her most recent visit 3 weeks ago with Cadence Kathlen Mody, PA, Ms. Caryl Comes was asymptomatic but remained in atrial fibrillation with a ventricular rate of 103 bpm.  She was planning to undergo lumpectomy and lymph node dissection the next week.  Metoprolol succinate was increased to 100 mg daily to improve rate control.  Today, Ms. Caryl Comes reports that she feels relatively well though she is more fatigued than usual.  She attributes this to a combination of her atrial fibrillation and recent lumpectomy.  She will likely begin radiation treatment to the left breast in the next few weeks.  She denies chest pain, shortness of breath, and lightheadedness.  She has sporadic palpitations during which it feels like her heart is beating faster than usual.  She remains compliant with her medications and restarted apixaban on the evening of 06/27/2020 after her left breast lumpectomy.  She does not feel significantly different with escalation of metoprolol after her last visit.  --------------------------------------------------------------------------------------------------  Cardiovascular History & Procedures: Cardiovascular Problems:  Persistent atrial fibrillation  Atypical chest pain  Risk Factors:  Hypertension, hyperlipidemia, and age greater than 71  Cath/PCI:  None  CV Surgery:  None  EP Procedures and Devices:  Event monitor  (10/07/16):Predominantly sinus rhythm with frequent PACs and occasional PVCs. Single episode of NSVT. Multiple atrial runs lasting up to 19.4 seconds. No sustained arrhythmias.  Event monitor (07/07/16): Predominantly sinus rhythm with 20 episodes of narrow complex tachycardia and irregularity consistent with atrial fibrillation (longest episode noted approximately 12 seconds). Frequent PACs and rare PVCs were noted.  Non-Invasive Evaluation(s):  TTE (07/04/2020): Normal LV size and wall thickness.  LVEF 60-65% with normal wall motion.  Normal RV size and function.  Moderate left atrial enlargement.  No significant valvular abnormality.  TTE (08/06/16): Normal LV size with LVEF of 55-65%. Normal wall motion with grade 1 diastolic dysfunction. Mild left atrial enlargement. Normal RV size and function.  Exercise MPI (07/13/16): Low risk study without ischemia or scar. LVEF greater than 65%. Decreased exercise capacity, exercising 4 minutes, zero seconds, achieving 117 bpm (90% MPHR; 4.6 METs).   Recent CV Pertinent Labs: Lab Results  Component Value Date   CHOL 167 05/13/2020   HDL 88.80 05/13/2020   LDLCALC 64 05/13/2020   TRIG 73.0 05/13/2020   CHOLHDL 2 05/13/2020   K 4.6 06/07/2020   K 3.5 12/21/2011   MG 2.0 06/07/2020   BUN 13 06/07/2020   CREATININE 0.92 (H) 06/07/2020    Past medical and surgical history were reviewed and updated in EPIC.  Current Meds  Medication Sig  . apixaban (ELIQUIS) 2.5 MG TABS tablet Take 1 tablet (2.5 mg total) by mouth 2 (two) times daily.  . benazepril (LOTENSIN) 40 MG tablet Take 1 tablet (40 mg total) by mouth daily.  . Calcium Carbonate-Vitamin D 600-400 MG-UNIT tablet Take 1 tablet by mouth daily.  . felodipine (PLENDIL) 2.5 MG 24 hr tablet Take 1 tablet (2.5 mg total) by mouth daily.  . furosemide (LASIX) 20 MG tablet  Take 1 tablet (20 mg total) by mouth daily as needed.  . metoprolol succinate (TOPROL-XL) 50 MG 24 hr tablet Take 2 tablets (100  mg) by mouth once daily  . Multiple Vitamin (MULTIVITAMIN) tablet Take 1 tablet by mouth daily.  . Omega-3 Fatty Acids (FISH OIL) 1000 MG CAPS Take 1,000 mg by mouth daily.  . rosuvastatin (CRESTOR) 5 MG tablet Take 1 tablet (5 mg total) by mouth daily.    Allergies: Erythromycin and Minocin [minocycline hcl]  Social History   Tobacco Use  . Smoking status: Former Research scientist (life sciences)  . Smokeless tobacco: Never Used  Vaping Use  . Vaping Use: Never used  Substance Use Topics  . Alcohol use: Yes    Alcohol/week: 0.0 standard drinks    Comment: occassionally  . Drug use: No    Family History  Problem Relation Age of Onset  . Breast cancer Maternal Aunt   . Hypertension Mother   . Hypertension Sister   . Heart disease Brother        S/P CABG  . Multiple sclerosis Sister        died 63    Review of Systems: A 12-system review of systems was performed and was negative except as noted in the HPI.  --------------------------------------------------------------------------------------------------  Physical Exam: BP 130/84 (BP Location: Left Arm, Patient Position: Sitting, Cuff Size: Normal)   Pulse (!) 109   Ht 5' (1.524 m)   Wt 128 lb (58.1 kg)   SpO2 96%   BMI 25.00 kg/m   General:  NAD. Neck: No JVD or HJR. Lungs: Clear to auscultation bilaterally without wheezes or crackles. Heart: Irregularly irregular rhythm with 1/6 systolic murmur.  No rubs or gallops. Abdomen: Soft, nontender, nondistended. Extremities: No lower extremity edema.  EKG: Atrial fibrillation with PVCs versus aberrancy (ventricular rate 109 bpm.  No significant change from prior tracing on 06/21/2020.  Lab Results  Component Value Date   WBC 5.7 06/07/2020   HGB 14.4 06/07/2020   HCT 43.8 06/07/2020   MCV 97.6 06/07/2020   PLT 214 06/07/2020    Lab Results  Component Value Date   NA 142 06/07/2020   K 4.6 06/07/2020   CL 105 06/07/2020   CO2 24 06/07/2020   BUN 13 06/07/2020   CREATININE 0.92  (H) 06/07/2020   GLUCOSE 92 06/07/2020   ALT 18 05/13/2020    Lab Results  Component Value Date   CHOL 167 05/13/2020   HDL 88.80 05/13/2020   LDLCALC 64 05/13/2020   TRIG 73.0 05/13/2020   CHOLHDL 2 05/13/2020    --------------------------------------------------------------------------------------------------  ASSESSMENT AND PLAN: Persistent atrial fibrillation: EKG today shows continued atrial fibrillation with borderline ventricular rate control.  Given soft blood pressures at home, we have agreed to defer medication changes and will proceed with cardioversion on 07/30/2020, at which time Ms. Caryl Comes will have completed greater than 4 weeks of uninterrupted anticoagulation.  We will continue with apixaban 2.5 mg twice daily based on age and weight.  Hypertension: Blood pressure normal today but often lower at home.  No medication changes at this time.  Shared Decision Making/Informed Consent The risks (stroke, cardiac arrhythmias rarely resulting in the need for a temporary or permanent pacemaker, skin irritation or burns and complications associated with conscious sedation including aspiration, arrhythmia, respiratory failure and death), benefits (restoration of normal sinus rhythm) and alternatives of a direct current cardioversion were explained in detail to Ms. Sze and she agrees to proceed.   Follow-up: Return to clinic in  4 weeks.  Nelva Bush, MD 07/12/2020 10:30 AM

## 2020-07-15 ENCOUNTER — Telehealth: Payer: Self-pay | Admitting: Internal Medicine

## 2020-07-15 ENCOUNTER — Ambulatory Visit
Admission: RE | Admit: 2020-07-15 | Discharge: 2020-07-15 | Disposition: A | Discharge status: Home / Self Care | Payer: PPO | Source: Ambulatory Visit | Attending: Radiation Oncology | Admitting: Radiation Oncology

## 2020-07-15 DIAGNOSIS — Z510 Encounter for antineoplastic radiation therapy: Secondary | ICD-10-CM | POA: Insufficient documentation

## 2020-07-15 DIAGNOSIS — C50412 Malignant neoplasm of upper-outer quadrant of left female breast: Secondary | ICD-10-CM | POA: Insufficient documentation

## 2020-07-15 DIAGNOSIS — Z170 Estrogen receptor positive status [ER+]: Secondary | ICD-10-CM | POA: Insufficient documentation

## 2020-07-15 LAB — SURGICAL PATHOLOGY

## 2020-07-15 MED ORDER — APIXABAN 2.5 MG PO TABS: 2.5000 mg | ORAL_TABLET | Freq: Two times a day (BID) | ORAL | 1 refills | Status: DC

## 2020-07-15 NOTE — Telephone Encounter (Signed)
Patient called in for refill apixaban (ELIQUIS) 2.5 MG TABS tablet

## 2020-07-16 DIAGNOSIS — Z510 Encounter for antineoplastic radiation therapy: Secondary | ICD-10-CM | POA: Diagnosis not present

## 2020-07-16 DIAGNOSIS — Z170 Estrogen receptor positive status [ER+]: Secondary | ICD-10-CM | POA: Diagnosis not present

## 2020-07-16 DIAGNOSIS — C50412 Malignant neoplasm of upper-outer quadrant of left female breast: Secondary | ICD-10-CM | POA: Diagnosis not present

## 2020-07-19 ENCOUNTER — Other Ambulatory Visit: Payer: Self-pay | Admitting: *Deleted

## 2020-07-19 DIAGNOSIS — Z170 Estrogen receptor positive status [ER+]: Secondary | ICD-10-CM

## 2020-07-19 DIAGNOSIS — C50412 Malignant neoplasm of upper-outer quadrant of left female breast: Secondary | ICD-10-CM

## 2020-07-22 ENCOUNTER — Ambulatory Visit: Admission: RE | Admit: 2020-07-22 | Payer: PPO | Source: Ambulatory Visit

## 2020-07-22 DIAGNOSIS — C50412 Malignant neoplasm of upper-outer quadrant of left female breast: Secondary | ICD-10-CM | POA: Diagnosis not present

## 2020-07-22 DIAGNOSIS — Z170 Estrogen receptor positive status [ER+]: Secondary | ICD-10-CM | POA: Diagnosis not present

## 2020-07-22 DIAGNOSIS — Z510 Encounter for antineoplastic radiation therapy: Secondary | ICD-10-CM | POA: Diagnosis not present

## 2020-07-23 ENCOUNTER — Ambulatory Visit
Admission: RE | Admit: 2020-07-23 | Discharge: 2020-07-23 | Disposition: A | Discharge status: Home / Self Care | Payer: PPO | Source: Ambulatory Visit | Attending: Radiation Oncology | Admitting: Radiation Oncology

## 2020-07-23 DIAGNOSIS — C50412 Malignant neoplasm of upper-outer quadrant of left female breast: Secondary | ICD-10-CM | POA: Diagnosis not present

## 2020-07-23 DIAGNOSIS — Z170 Estrogen receptor positive status [ER+]: Secondary | ICD-10-CM | POA: Diagnosis not present

## 2020-07-23 DIAGNOSIS — Z510 Encounter for antineoplastic radiation therapy: Secondary | ICD-10-CM | POA: Diagnosis not present

## 2020-07-24 ENCOUNTER — Ambulatory Visit
Admission: RE | Admit: 2020-07-24 | Discharge: 2020-07-24 | Disposition: A | Discharge status: Home / Self Care | Payer: PPO | Source: Ambulatory Visit | Attending: Radiation Oncology | Admitting: Radiation Oncology

## 2020-07-24 ENCOUNTER — Telehealth: Payer: Self-pay | Admitting: Medical

## 2020-07-24 ENCOUNTER — Other Ambulatory Visit: Payer: Self-pay

## 2020-07-24 DIAGNOSIS — Z510 Encounter for antineoplastic radiation therapy: Secondary | ICD-10-CM | POA: Diagnosis not present

## 2020-07-24 DIAGNOSIS — Z170 Estrogen receptor positive status [ER+]: Secondary | ICD-10-CM | POA: Diagnosis not present

## 2020-07-24 DIAGNOSIS — C50412 Malignant neoplasm of upper-outer quadrant of left female breast: Secondary | ICD-10-CM | POA: Diagnosis not present

## 2020-07-24 MED ORDER — METOPROLOL SUCCINATE ER 100 MG PO TB24: 100.0000 mg | ORAL_TABLET | Freq: Every day | ORAL | 0 refills | Status: DC

## 2020-07-24 NOTE — Telephone Encounter (Signed)
metoprolol succinate (TOPROL-XL) 100 MG 24 hr tablet 90 tablet 0 07/24/2020    Sig - Route: Take 1 tablet (100 mg total) by mouth daily. - Oral   Sent to pharmacy as: metoprolol succinate (TOPROL-XL) 100 MG 24 hr tablet   E-Prescribing Status: Sent to pharmacy (07/24/2020  3:56 PM EDT)     Pharmacy  [facility], [place] [facility] [place]

## 2020-07-24 NOTE — Telephone Encounter (Signed)
*  STAT* If patient is at the pharmacy, call can be transferred to refill team.   1. Which medications need to be refilled? (please list name of each medication and dose if known) metoprolol 100 mg po q d   2. Which pharmacy/location (including street and city if local pharmacy) is medication to be sent to?  [address]   3. Do they need a 30 day or 90 day supply? [place]

## 2020-07-25 ENCOUNTER — Ambulatory Visit
Admission: RE | Admit: 2020-07-25 | Discharge: 2020-07-25 | Disposition: A | Discharge status: Home / Self Care | Payer: PPO | Source: Ambulatory Visit | Attending: Radiation Oncology | Admitting: Radiation Oncology

## 2020-07-25 DIAGNOSIS — Z170 Estrogen receptor positive status [ER+]: Secondary | ICD-10-CM | POA: Diagnosis not present

## 2020-07-25 DIAGNOSIS — Z510 Encounter for antineoplastic radiation therapy: Secondary | ICD-10-CM | POA: Diagnosis not present

## 2020-07-25 DIAGNOSIS — C50412 Malignant neoplasm of upper-outer quadrant of left female breast: Secondary | ICD-10-CM | POA: Diagnosis not present

## 2020-07-26 ENCOUNTER — Other Ambulatory Visit: Payer: PPO

## 2020-07-26 ENCOUNTER — Telehealth: Payer: Self-pay | Admitting: Internal Medicine

## 2020-07-26 ENCOUNTER — Ambulatory Visit
Admission: RE | Admit: 2020-07-26 | Discharge: 2020-07-26 | Disposition: A | Discharge status: Home / Self Care | Payer: PPO | Source: Ambulatory Visit | Attending: Radiation Oncology | Admitting: Radiation Oncology

## 2020-07-26 ENCOUNTER — Other Ambulatory Visit
Admission: RE | Admit: 2020-07-26 | Discharge: 2020-07-26 | Disposition: A | Discharge status: Home / Self Care | Payer: PPO | Source: Ambulatory Visit | Attending: Internal Medicine | Admitting: Internal Medicine

## 2020-07-26 DIAGNOSIS — E785 Hyperlipidemia, unspecified: Secondary | ICD-10-CM | POA: Insufficient documentation

## 2020-07-26 DIAGNOSIS — I10 Essential (primary) hypertension: Secondary | ICD-10-CM

## 2020-07-26 DIAGNOSIS — C50412 Malignant neoplasm of upper-outer quadrant of left female breast: Secondary | ICD-10-CM | POA: Diagnosis not present

## 2020-07-26 DIAGNOSIS — R739 Hyperglycemia, unspecified: Secondary | ICD-10-CM | POA: Insufficient documentation

## 2020-07-26 DIAGNOSIS — I4819 Other persistent atrial fibrillation: Secondary | ICD-10-CM | POA: Diagnosis not present

## 2020-07-26 DIAGNOSIS — Z510 Encounter for antineoplastic radiation therapy: Secondary | ICD-10-CM | POA: Diagnosis not present

## 2020-07-26 DIAGNOSIS — Z170 Estrogen receptor positive status [ER+]: Secondary | ICD-10-CM | POA: Diagnosis not present

## 2020-07-26 LAB — BASIC METABOLIC PANEL
Anion gap: 8 (ref 5–15)
BUN: 21 mg/dL (ref 8–23)
CO2: 27 mmol/L (ref 22–32)
Calcium: 9.9 mg/dL (ref 8.9–10.3)
Chloride: 105 mmol/L (ref 98–111)
Creatinine, Ser: 0.8 mg/dL (ref 0.44–1.00)
GFR, Estimated: >60 mL/min (ref >60)
Glucose, Bld: 97 mg/dL (ref 70–99)
Potassium: 4.2 mmol/L (ref 3.5–5.1)
Sodium: 140 mmol/L (ref 135–145)

## 2020-07-26 LAB — HEPATIC FUNCTION PANEL
ALT: 52 U/L — ABNORMAL HIGH (ref 0–44)
AST: 59 U/L — ABNORMAL HIGH (ref 15–41)
Albumin: 4.1 g/dL (ref 3.5–5.0)
Alkaline Phosphatase: 77 U/L (ref 38–126)
Bilirubin, Direct: 0.1 mg/dL (ref 0.0–0.2)
Indirect Bilirubin: 0.8 mg/dL (ref 0.3–0.9)
Total Bilirubin: 0.9 mg/dL (ref 0.3–1.2)
Total Protein: 6.9 g/dL (ref 6.5–8.1)

## 2020-07-26 LAB — CBC WITH DIFFERENTIAL/PLATELET
Abs Immature Granulocytes: 0.01 10*3/uL (ref 0.00–0.07)
Basophils Absolute: 0 10*3/uL (ref 0.0–0.1)
Basophils Relative: 1 %
Eosinophils Absolute: 0.1 10*3/uL (ref 0.0–0.5)
Eosinophils Relative: 2 %
HCT: 42.9 % (ref 36.0–46.0)
Hemoglobin: 14.6 g/dL (ref 12.0–15.0)
Immature Granulocytes: 0 %
Lymphocytes Relative: 19 %
Lymphs Abs: 1.3 10*3/uL (ref 0.7–4.0)
MCH: 32.4 pg (ref 26.0–34.0)
MCHC: 34 g/dL (ref 30.0–36.0)
MCV: 95.3 fL (ref 80.0–100.0)
Monocytes Absolute: 0.7 10*3/uL (ref 0.1–1.0)
Monocytes Relative: 10 %
Neutro Abs: 4.8 10*3/uL (ref 1.7–7.7)
Neutrophils Relative %: 68 %
Platelets: 209 10*3/uL (ref 150–400)
RBC: 4.5 MIL/uL (ref 3.87–5.11)
RDW: 13.1 % (ref 11.5–15.5)
WBC: 6.9 10*3/uL (ref 4.0–10.5)
nRBC: 0 % (ref 0.0–0.2)

## 2020-07-26 LAB — TSH: TSH: 0.764 u[IU]/mL (ref 0.350–4.500)

## 2020-07-26 LAB — LIPID PANEL
Cholesterol: 158 mg/dL (ref 0–200)
HDL: 79 mg/dL (ref >40)
LDL Cholesterol: 67 mg/dL (ref 0–99)
Total CHOL/HDL Ratio: 2 RATIO
Triglycerides: 61 mg/dL (ref <150)
VLDL: 12 mg/dL (ref 0–40)

## 2020-07-26 LAB — HEMOGLOBIN A1C
Hgb A1c MFr Bld: 5.8 % — ABNORMAL HIGH (ref 4.8–5.6)
Mean Plasma Glucose: 119.76 mg/dL

## 2020-07-26 NOTE — Telephone Encounter (Signed)
Patient called to notify no longer needs covid testing for Cardioversion. Stated she went 89mins early for no reason

## 2020-07-27 ENCOUNTER — Other Ambulatory Visit: Payer: Self-pay | Admitting: Internal Medicine

## 2020-07-27 DIAGNOSIS — R945 Abnormal results of liver function studies: Secondary | ICD-10-CM

## 2020-07-27 DIAGNOSIS — R7989 Other specified abnormal findings of blood chemistry: Secondary | ICD-10-CM

## 2020-07-27 NOTE — Progress Notes (Signed)
Order placed for f/u liver panel.  

## 2020-07-29 ENCOUNTER — Ambulatory Visit
Admission: RE | Admit: 2020-07-29 | Discharge: 2020-07-29 | Disposition: A | Discharge status: Home / Self Care | Payer: PPO | Source: Ambulatory Visit | Attending: Radiation Oncology | Admitting: Radiation Oncology

## 2020-07-29 DIAGNOSIS — Z510 Encounter for antineoplastic radiation therapy: Secondary | ICD-10-CM | POA: Diagnosis not present

## 2020-07-29 DIAGNOSIS — C50412 Malignant neoplasm of upper-outer quadrant of left female breast: Secondary | ICD-10-CM | POA: Diagnosis not present

## 2020-07-29 DIAGNOSIS — Z170 Estrogen receptor positive status [ER+]: Secondary | ICD-10-CM | POA: Diagnosis not present

## 2020-07-30 ENCOUNTER — Ambulatory Visit: Payer: PPO | Admitting: Certified Registered"

## 2020-07-30 ENCOUNTER — Ambulatory Visit: Payer: PPO

## 2020-07-30 ENCOUNTER — Encounter: Payer: Self-pay | Admitting: Internal Medicine

## 2020-07-30 ENCOUNTER — Encounter
Admission: RE | Disposition: A | Discharge status: Home / Self Care | Payer: Self-pay | Source: Home / Self Care | Attending: Internal Medicine

## 2020-07-30 ENCOUNTER — Ambulatory Visit
Admission: RE | Admit: 2020-07-30 | Discharge: 2020-07-30 | Disposition: A | Discharge status: Home / Self Care | Payer: PPO | Attending: Internal Medicine | Admitting: Internal Medicine

## 2020-07-30 ENCOUNTER — Other Ambulatory Visit: Payer: Self-pay

## 2020-07-30 DIAGNOSIS — I4891 Unspecified atrial fibrillation: Secondary | ICD-10-CM | POA: Diagnosis not present

## 2020-07-30 DIAGNOSIS — E785 Hyperlipidemia, unspecified: Secondary | ICD-10-CM | POA: Insufficient documentation

## 2020-07-30 DIAGNOSIS — I4892 Unspecified atrial flutter: Secondary | ICD-10-CM | POA: Insufficient documentation

## 2020-07-30 DIAGNOSIS — I119 Hypertensive heart disease without heart failure: Secondary | ICD-10-CM | POA: Diagnosis not present

## 2020-07-30 DIAGNOSIS — Z79899 Other long term (current) drug therapy: Secondary | ICD-10-CM | POA: Diagnosis not present

## 2020-07-30 DIAGNOSIS — I4819 Other persistent atrial fibrillation: Secondary | ICD-10-CM | POA: Diagnosis not present

## 2020-07-30 DIAGNOSIS — Z7901 Long term (current) use of anticoagulants: Secondary | ICD-10-CM | POA: Diagnosis not present

## 2020-07-30 DIAGNOSIS — Z87891 Personal history of nicotine dependence: Secondary | ICD-10-CM | POA: Insufficient documentation

## 2020-07-30 DIAGNOSIS — Z8249 Family history of ischemic heart disease and other diseases of the circulatory system: Secondary | ICD-10-CM | POA: Insufficient documentation

## 2020-07-30 DIAGNOSIS — Z853 Personal history of malignant neoplasm of breast: Secondary | ICD-10-CM | POA: Insufficient documentation

## 2020-07-30 DIAGNOSIS — Z803 Family history of malignant neoplasm of breast: Secondary | ICD-10-CM | POA: Diagnosis not present

## 2020-07-30 DIAGNOSIS — Z881 Allergy status to other antibiotic agents status: Secondary | ICD-10-CM | POA: Insufficient documentation

## 2020-07-30 DIAGNOSIS — E7800 Pure hypercholesterolemia, unspecified: Secondary | ICD-10-CM | POA: Diagnosis not present

## 2020-07-30 HISTORY — PX: CARDIOVERSION: SHX1299

## 2020-07-30 SURGERY — CARDIOVERSION
Anesthesia: General

## 2020-07-30 MED ORDER — SODIUM CHLORIDE 0.9 % IV SOLN
Freq: Once | INTRAVENOUS | Status: AC
  Administered 2020-07-30: 1000 mL via INTRAVENOUS

## 2020-07-30 MED ORDER — AMIODARONE HCL 200 MG PO TABS
400.0000 mg | ORAL_TABLET | Freq: Two times a day (BID) | ORAL | Status: DC
  Administered 2020-07-30: 400 mg via ORAL
  Filled 2020-07-30: qty 2

## 2020-07-30 MED ORDER — PROPOFOL 10 MG/ML IV BOLUS
INTRAVENOUS | Status: DC | PRN
  Administered 2020-07-30: 20 mg via INTRAVENOUS
  Administered 2020-07-30: 40 mg via INTRAVENOUS

## 2020-07-30 MED ORDER — AMIODARONE HCL 200 MG PO TABS: ORAL_TABLET | ORAL | 3 refills | Status: DC

## 2020-07-30 MED ORDER — PROPOFOL 10 MG/ML IV BOLUS
INTRAVENOUS | Status: AC
  Filled 2020-07-30: qty 20

## 2020-07-30 MED ORDER — SODIUM CHLORIDE 0.9 % IV SOLN: INTRAVENOUS | Status: DC | PRN

## 2020-07-30 NOTE — Transfer of Care (Signed)
Immediate Anesthesia Transfer of Care Note  Patient: Erin [name]  Procedure(s) Performed: CARDIOVERSION (N/A )  Patient Location: PACU and Cath Lab  Anesthesia Type:General  Level of Consciousness: drowsy  Airway & Oxygen Therapy: Patient Spontanous Breathing and Patient connected to nasal cannula oxygen  Post-op Assessment: Report given to RN  Post vital signs: stable  Last Vitals:  Vitals Value Taken Time  BP 104/82 07/30/20 0742  Temp    Pulse 133 07/30/20 0743  Resp 21 07/30/20 0743  SpO2 96 % 07/30/20 0743    Last Pain:  Vitals:   07/30/20 0655  TempSrc: Oral  PainSc: 0-No pain         Complications: No complications documented.

## 2020-07-30 NOTE — Anesthesia Preprocedure Evaluation (Signed)
Anesthesia Evaluation  Patient identified by MRN, date of birth, ID band Patient awake    Reviewed: Allergy & Precautions, NPO status , Patient's Chart, lab work & pertinent test results  History of Anesthesia Complications Negative for: history of anesthetic complications  Airway Mallampati: II       Dental  (+) Dental Advidsory Given   Pulmonary neg pulmonary ROS, neg shortness of breath, neg sleep apnea, neg COPD, neg recent URI, Not current smoker, former smoker,           Cardiovascular Exercise Tolerance: Good hypertension, Pt. on medications (-) angina(-) Past MI and (-) CHF + dysrhythmias Atrial Fibrillation (-) Valvular Problems/Murmurs     Neuro/Psych neg Seizures    GI/Hepatic Neg liver ROS, neg GERD  ,  Endo/Other  neg diabetes  Renal/GU negative Renal ROS     Musculoskeletal   Abdominal   Peds  Hematology   Anesthesia Other Findings Past Medical History: No date: A-fib [facility]) No date: Breast cancer ([place]) No date: Cancer ([place])     Comment:  melanoma - arm left 07/13/2016: Chest pain No date: History of colon polyps No date: Hypercholesterolemia No date: Hypertension   Reproductive/Obstetrics                             Anesthesia Physical  Anesthesia Plan  ASA: III  Anesthesia Plan: General   Post-op Pain Management:    Induction: Intravenous  PONV Risk Score and Plan: 3 and TIVA  Airway Management Planned: Nasal Cannula  Additional Equipment:   Intra-op Plan:   Post-operative Plan:   Informed Consent: I have reviewed the patients History and Physical, chart, labs and discussed the procedure including the risks, benefits and alternatives for the proposed anesthesia with the patient or authorized representative who has indicated his/her understanding and acceptance.       Plan Discussed with:   Anesthesia Plan Comments:         Anesthesia Quick  Evaluation

## 2020-07-30 NOTE — Anesthesia Postprocedure Evaluation (Signed)
Anesthesia Post Note  Patient: Erin [name]  Procedure(s) Performed: CARDIOVERSION (N/A )  Patient location during evaluation: Specials Recovery Anesthesia Type: General Level of consciousness: awake and alert Pain management: pain level controlled Vital Signs Assessment: post-procedure vital signs reviewed and stable Respiratory status: spontaneous breathing, nonlabored ventilation, respiratory function stable and patient connected to nasal cannula oxygen Cardiovascular status: blood pressure returned to baseline and stable Postop Assessment: no apparent nausea or vomiting Anesthetic complications: no   No complications documented.   Last Vitals:  Vitals:   07/30/20 0845 07/30/20 0900  BP: 129/82 130/89  Pulse:    Resp: 19 (!) 25  Temp:    SpO2:      Last Pain:  Vitals:   07/30/20 0900  TempSrc:   PainSc: 0-No pain                 Martha Clan

## 2020-07-30 NOTE — Interval H&P Note (Signed)
History and Physical Interval Note:  07/30/2020 7:20 AM  Erin [name]  has presented today for surgery, with the diagnosis of persistent atrial fibrillation.  The various methods of treatment have been discussed with the patient and family. After consideration of risks, benefits and other options for treatment, the patient has consented to  Procedure(s): CARDIOVERSION (N/A) as a surgical intervention.  The patient's history has been reviewed, patient examined, no change in status, stable for surgery.  I have reviewed the patient's chart and labs.  Questions were answered to the patient's satisfaction.     Wilson Dusenbery

## 2020-07-30 NOTE — Progress Notes (Signed)
[facility]  Date: 07/30/20 Time: 8:48 AM  Patient is back in atrial fibrillation with ventricular rates near 100 bpm.  She is asymptomatic.  We have agreed to discharge her home and to initiate amiodarone load with amiodarone 400 mg PO BID x 12 days, followed by 200 mg daily thereafter.  She will follow-up as planned on 08/15/2020 in the office.  If she remains in atrial fibrillation/flutter at that time, we will make arrangements for repeat DCCV.  Erin [name] should continue her other medications, including current doses of metoprolol and apixaban.  Nelva Bush, MD [facility] [

## 2020-07-30 NOTE — CV Procedure (Signed)
    Cardioversion Note  Erin [name] [id number] Jan 21, 1931  Procedure: DC Cardioversion Indications: Persistent atrial flutter  Procedure Details Consent: Obtained Time Out: Verified patient identification, verified procedure, site/side was marked, verified correct patient position, special equipment/implants available, Radiology Safety Procedures followed,  medications/allergies/relevent history reviewed, required imaging and test results available.  Performed  The patient has been on adequate anticoagulation.  The patient received IV propofol by anesthesia for sedation.  Synchronous cardioversion was performed at 200 joules x 4.  The cardioversion was not successful, with the patient reverting to atrial fibrillation/flutter after each shock.  She was in atrial flutter with 2:1 conduction after the final shock.  Complications: No apparent complications Patient did tolerate procedure well.  Recommendations:  Monitor heart rate in holding; if no improvement in ventricular rates over next 30 minutes, we will need to admit for IV amiodarone loading and repeat cardioversion if she does not spontaneously convert.  Nelva Bush., MD 07/30/2020, 7:51 AM

## 2020-07-31 ENCOUNTER — Ambulatory Visit
Admission: RE | Admit: 2020-07-31 | Discharge: 2020-07-31 | Disposition: A | Discharge status: Home / Self Care | Payer: PPO | Source: Ambulatory Visit | Attending: Radiation Oncology | Admitting: Radiation Oncology

## 2020-07-31 DIAGNOSIS — Z510 Encounter for antineoplastic radiation therapy: Secondary | ICD-10-CM | POA: Diagnosis not present

## 2020-07-31 DIAGNOSIS — C50412 Malignant neoplasm of upper-outer quadrant of left female breast: Secondary | ICD-10-CM | POA: Diagnosis not present

## 2020-07-31 DIAGNOSIS — Z170 Estrogen receptor positive status [ER+]: Secondary | ICD-10-CM | POA: Diagnosis not present

## 2020-08-01 ENCOUNTER — Inpatient Hospital Stay: Payer: PPO

## 2020-08-01 ENCOUNTER — Ambulatory Visit
Admission: RE | Admit: 2020-08-01 | Discharge: 2020-08-01 | Disposition: A | Discharge status: Home / Self Care | Payer: PPO | Source: Ambulatory Visit | Attending: Radiation Oncology | Admitting: Radiation Oncology

## 2020-08-01 DIAGNOSIS — Z510 Encounter for antineoplastic radiation therapy: Secondary | ICD-10-CM | POA: Diagnosis not present

## 2020-08-01 DIAGNOSIS — C50412 Malignant neoplasm of upper-outer quadrant of left female breast: Secondary | ICD-10-CM | POA: Diagnosis not present

## 2020-08-01 DIAGNOSIS — Z170 Estrogen receptor positive status [ER+]: Secondary | ICD-10-CM | POA: Diagnosis not present

## 2020-08-02 ENCOUNTER — Ambulatory Visit
Admission: RE | Admit: 2020-08-02 | Discharge: 2020-08-02 | Disposition: A | Discharge status: Home / Self Care | Payer: PPO | Source: Ambulatory Visit | Attending: Radiation Oncology | Admitting: Radiation Oncology

## 2020-08-02 DIAGNOSIS — Z170 Estrogen receptor positive status [ER+]: Secondary | ICD-10-CM | POA: Diagnosis not present

## 2020-08-02 DIAGNOSIS — Z510 Encounter for antineoplastic radiation therapy: Secondary | ICD-10-CM | POA: Diagnosis not present

## 2020-08-02 DIAGNOSIS — C50412 Malignant neoplasm of upper-outer quadrant of left female breast: Secondary | ICD-10-CM | POA: Diagnosis not present

## 2020-08-05 ENCOUNTER — Ambulatory Visit
Admission: RE | Admit: 2020-08-05 | Discharge: 2020-08-05 | Disposition: A | Discharge status: Home / Self Care | Payer: PPO | Source: Ambulatory Visit | Attending: Radiation Oncology | Admitting: Radiation Oncology

## 2020-08-05 ENCOUNTER — Inpatient Hospital Stay: Payer: PPO | Attending: Radiation Oncology

## 2020-08-05 ENCOUNTER — Other Ambulatory Visit: Payer: PPO

## 2020-08-05 ENCOUNTER — Other Ambulatory Visit: Payer: Self-pay

## 2020-08-05 ENCOUNTER — Telehealth: Payer: Self-pay | Admitting: Internal Medicine

## 2020-08-05 DIAGNOSIS — C50412 Malignant neoplasm of upper-outer quadrant of left female breast: Secondary | ICD-10-CM | POA: Diagnosis not present

## 2020-08-05 DIAGNOSIS — Z170 Estrogen receptor positive status [ER+]: Secondary | ICD-10-CM | POA: Insufficient documentation

## 2020-08-05 DIAGNOSIS — Z510 Encounter for antineoplastic radiation therapy: Secondary | ICD-10-CM | POA: Diagnosis not present

## 2020-08-05 LAB — CBC
HCT: 41.9 % (ref 36.0–46.0)
Hemoglobin: 14.1 g/dL (ref 12.0–15.0)
MCH: 32.4 pg (ref 26.0–34.0)
MCHC: 33.7 g/dL (ref 30.0–36.0)
MCV: 96.3 fL (ref 80.0–100.0)
Platelets: 204 10*3/uL (ref 150–400)
RBC: 4.35 MIL/uL (ref 3.87–5.11)
RDW: 12.8 % (ref 11.5–15.5)
WBC: 6.6 10*3/uL (ref 4.0–10.5)
nRBC: 0 % (ref 0.0–0.2)

## 2020-08-05 NOTE — Telephone Encounter (Signed)
PT called to inform that she was suppose to have labs done today but someone canceled them. She wanted to know if she needed them to be done still. She wants to speak to Dr.Scott or [place].

## 2020-08-05 NOTE — Telephone Encounter (Signed)
I am not sure who cancelled her appointment. She needs her liver function checked. Can I add her on to the lab schedule around 9:30 in the morning? She is going out for another appt.

## 2020-08-06 ENCOUNTER — Other Ambulatory Visit: Payer: Self-pay

## 2020-08-06 ENCOUNTER — Other Ambulatory Visit (INDEPENDENT_AMBULATORY_CARE_PROVIDER_SITE_OTHER): Payer: PPO

## 2020-08-06 ENCOUNTER — Ambulatory Visit: Payer: PPO

## 2020-08-06 ENCOUNTER — Ambulatory Visit
Admission: RE | Admit: 2020-08-06 | Discharge: 2020-08-06 | Disposition: A | Discharge status: Home / Self Care | Payer: PPO | Source: Ambulatory Visit | Attending: Radiation Oncology | Admitting: Radiation Oncology

## 2020-08-06 DIAGNOSIS — C50412 Malignant neoplasm of upper-outer quadrant of left female breast: Secondary | ICD-10-CM | POA: Diagnosis not present

## 2020-08-06 DIAGNOSIS — Z170 Estrogen receptor positive status [ER+]: Secondary | ICD-10-CM | POA: Diagnosis not present

## 2020-08-06 DIAGNOSIS — R945 Abnormal results of liver function studies: Secondary | ICD-10-CM

## 2020-08-06 DIAGNOSIS — R7989 Other specified abnormal findings of blood chemistry: Secondary | ICD-10-CM

## 2020-08-06 DIAGNOSIS — Z510 Encounter for antineoplastic radiation therapy: Secondary | ICD-10-CM | POA: Diagnosis not present

## 2020-08-06 LAB — HEPATIC FUNCTION PANEL
ALT: 22 U/L (ref 0–35)
AST: 20 U/L (ref 0–37)
Albumin: 4.5 g/dL (ref 3.5–5.2)
Alkaline Phosphatase: 71 U/L (ref 39–117)
Bilirubin, Direct: 0.1 mg/dL (ref 0.0–0.3)
Total Bilirubin: 0.6 mg/dL (ref 0.2–1.2)
Total Protein: 6.7 g/dL (ref 6.0–8.3)

## 2020-08-06 NOTE — Telephone Encounter (Signed)
Pt came in for labs this morning. 

## 2020-08-06 NOTE — Telephone Encounter (Signed)
Pt is coming in after her radiation to have her liver function drawn.

## 2020-08-06 NOTE — Telephone Encounter (Signed)
Looks  like appt was canceled by Oncology CMA by mistake.

## 2020-08-06 NOTE — Telephone Encounter (Signed)
Look at lab appt for 08/05/20 that was cancelled

## 2020-08-07 ENCOUNTER — Ambulatory Visit
Admission: RE | Admit: 2020-08-07 | Discharge: 2020-08-07 | Disposition: A | Discharge status: Home / Self Care | Payer: PPO | Source: Ambulatory Visit | Attending: Radiation Oncology | Admitting: Radiation Oncology

## 2020-08-07 ENCOUNTER — Telehealth: Payer: Self-pay | Admitting: Internal Medicine

## 2020-08-07 DIAGNOSIS — Z170 Estrogen receptor positive status [ER+]: Secondary | ICD-10-CM | POA: Diagnosis not present

## 2020-08-07 DIAGNOSIS — Z510 Encounter for antineoplastic radiation therapy: Secondary | ICD-10-CM | POA: Diagnosis not present

## 2020-08-07 DIAGNOSIS — C50412 Malignant neoplasm of upper-outer quadrant of left female breast: Secondary | ICD-10-CM | POA: Diagnosis not present

## 2020-08-07 NOTE — Telephone Encounter (Signed)
Patient returned Trisha's phone call. Patent will be home this afternoon and tomorrow afternoon. Patient thinks she was calling to give her lab results.

## 2020-08-07 NOTE — Telephone Encounter (Signed)
Patient is aware of results.

## 2020-08-08 ENCOUNTER — Ambulatory Visit
Admission: RE | Admit: 2020-08-08 | Discharge: 2020-08-08 | Disposition: A | Discharge status: Home / Self Care | Payer: PPO | Source: Ambulatory Visit | Attending: Radiation Oncology | Admitting: Radiation Oncology

## 2020-08-08 DIAGNOSIS — Z510 Encounter for antineoplastic radiation therapy: Secondary | ICD-10-CM | POA: Diagnosis not present

## 2020-08-08 DIAGNOSIS — Z170 Estrogen receptor positive status [ER+]: Secondary | ICD-10-CM | POA: Diagnosis not present

## 2020-08-08 DIAGNOSIS — C50412 Malignant neoplasm of upper-outer quadrant of left female breast: Secondary | ICD-10-CM | POA: Diagnosis not present

## 2020-08-09 ENCOUNTER — Ambulatory Visit
Admission: RE | Admit: 2020-08-09 | Discharge: 2020-08-09 | Disposition: A | Discharge status: Home / Self Care | Payer: PPO | Source: Ambulatory Visit | Attending: Radiation Oncology | Admitting: Radiation Oncology

## 2020-08-09 DIAGNOSIS — Z510 Encounter for antineoplastic radiation therapy: Secondary | ICD-10-CM | POA: Diagnosis not present

## 2020-08-09 DIAGNOSIS — Z170 Estrogen receptor positive status [ER+]: Secondary | ICD-10-CM | POA: Diagnosis not present

## 2020-08-09 DIAGNOSIS — C50412 Malignant neoplasm of upper-outer quadrant of left female breast: Secondary | ICD-10-CM | POA: Diagnosis not present

## 2020-08-13 ENCOUNTER — Ambulatory Visit
Admission: RE | Admit: 2020-08-13 | Discharge: 2020-08-13 | Disposition: A | Discharge status: Home / Self Care | Payer: PPO | Source: Ambulatory Visit | Attending: Radiation Oncology | Admitting: Radiation Oncology

## 2020-08-13 DIAGNOSIS — C50412 Malignant neoplasm of upper-outer quadrant of left female breast: Secondary | ICD-10-CM | POA: Diagnosis not present

## 2020-08-13 DIAGNOSIS — Z510 Encounter for antineoplastic radiation therapy: Secondary | ICD-10-CM | POA: Diagnosis not present

## 2020-08-13 DIAGNOSIS — Z170 Estrogen receptor positive status [ER+]: Secondary | ICD-10-CM | POA: Diagnosis not present

## 2020-08-14 ENCOUNTER — Ambulatory Visit
Admission: RE | Admit: 2020-08-14 | Discharge: 2020-08-14 | Disposition: A | Discharge status: Home / Self Care | Payer: PPO | Source: Ambulatory Visit | Attending: Radiation Oncology | Admitting: Radiation Oncology

## 2020-08-14 DIAGNOSIS — Z170 Estrogen receptor positive status [ER+]: Secondary | ICD-10-CM | POA: Insufficient documentation

## 2020-08-14 DIAGNOSIS — Z510 Encounter for antineoplastic radiation therapy: Secondary | ICD-10-CM | POA: Diagnosis not present

## 2020-08-14 DIAGNOSIS — C50412 Malignant neoplasm of upper-outer quadrant of left female breast: Secondary | ICD-10-CM | POA: Insufficient documentation

## 2020-08-14 NOTE — H&P (View-Only) (Signed)
Cardiology Office Note:    Date:  08/15/2020   ID:  ZYON GROUT, DOB 09-Sep-1930, MRN [id number]  PCP:  Einar Pheasant, MD  [facility] Cardiologist:  Nelva Bush, MD  [facility] Electrophysiologist:  None   Referring MD: Einar Pheasant, MD   Chief Complaint: post cardioversion follow-up  History of Present Illness:    Erin [name] is a 87 y.o. female with a hx of persistent afib, HTN, HLD, and breast cancer who presents post-cardioversion.   Seen by PCP in March who noted she was in afib and she was recommended to see cardiologist. She was seen in the cardiology office and was in rate controlled afib. She was asymptomatic. She was planning to undergo lumpectomy and lymph node dissection the next week so cardioversion was held. Metoprolol was increased to 100mg  daily for better rate control.  Seen 07/12/20 and was in afib. She restarted eliquis 06/27/20 after the lumpectomy. She reported compliance with Eliquis and was set up for cardioversion. She underwent cardioversion 4/29 and it was unsuccessful converting back to afib/flutter. Plan was to admit for IV amiodarone load, however after discussion she was discharged on oral amiodarone.   Today, she remains in aflutter with rate of 84bpm. She says she is feeling about the same. She took amiodarone 400mg  BID for 12 days as instructed and was supposed to be taking 200mg  daily, but she thinks she has been taking 100mg  twce daily. We will check with the pharmacy. She has stable shortness of breath on exertion.  No chest pain, palpitations, orthopnea, pnd. Reports some lower leg edema for the last few days, trace edema on exam. She is taking lasix 20mg  daily. She does not wear compression socks or elevate her legs. She eats a low salt diet. Reports she hasn't missed any doses of eliquis.    Past Medical History:  Diagnosis Date  . A-fib ([place])   . Breast cancer ([place])   . Cancer (HCC)    melanoma - arm left  . Chest pain 07/13/2016   . History of colon polyps   . Hypercholesterolemia   . Hypertension     Past Surgical History:  Procedure Laterality Date  . APPENDECTOMY  1946  . BREAST BIOPSY Left 06/04/2020   [place] bx, vision marker, path pending  . BREAST LUMPECTOMY WITH SENTINEL LYMPH NODE BIOPSY Left 06/24/2020   Procedure: BREAST LUMPECTOMY WITH SENTINEL LYMPH NODE BIOPSY;  Surgeon: Robert Bellow, MD;  Location: [facility] ORS;  Service: General;  Laterality: Left;  . CARDIOVERSION N/A 07/30/2020   Procedure: CARDIOVERSION;  Surgeon: Nelva Bush, MD;  Location: [facility] ORS;  Service: Cardiovascular;  Laterality: N/A;  . CATARACT EXTRACTION Bilateral   . DILATION AND CURETTAGE OF UTERUS    . LAMINECTOMY  2000  . release of foraminal stenosis  2003    Current Medications: No outpatient medications have been marked as taking for the 08/15/20 encounter (Appointment) with Kathlen Mody, Nino Amano H, PA-C.     Allergies:   Erythromycin and Minocin [minocycline hcl]   Social History   Socioeconomic History  . Marital status: Widowed    Spouse name: Not on file  . Number of children: 4  . Years of education: Not on file  . Highest education level: Not on file  Occupational History  . Occupation: retired  Tobacco Use  . Smoking status: Former Research scientist (life sciences)  . Smokeless tobacco: Never Used  Vaping Use  . Vaping Use: Never used  Substance and Sexual Activity  . Alcohol  use: Yes    Alcohol/week: 0.0 standard drinks    Comment: occassionally  . Drug use: No  . Sexual activity: Never  Other Topics Concern  . Not on file  Social History Narrative  . Not on file   Social Determinants of Health   Financial Resource Strain: Low Risk   . Difficulty of Paying Living Expenses: Not hard at all  Food Insecurity: No Food Insecurity  . Worried About Charity fundraiser in the Last Year: Never true  . Ran Out of Food in the Last Year: Never true  Transportation Needs: No Transportation Needs  . Lack of Transportation (Medical):  No  . Lack of Transportation (Non-Medical): No  Physical Activity: Sufficiently Active  . Days of Exercise per Week: 5 days  . Minutes of Exercise per Session: 30 min  Stress: No Stress Concern Present  . Feeling of Stress : Not at all  Social Connections: Unknown  . Frequency of Communication with Friends and Family: More than three times a week  . Frequency of Social Gatherings with Friends and Family: Twice a week  . Attends Religious Services: More than 4 times per year  . Active Member of Clubs or Organizations: Yes  . Attends Archivist Meetings: Not on file  . Marital Status: Not on file     Family History: The patient's *family history includes Breast cancer in her maternal aunt; Heart disease in her brother; Hypertension in her mother and sister; Multiple sclerosis in her sister.  ROS:   Please see the history of present illness.     All other systems reviewed and are negative.  EKGs/Labs/Other Studies Reviewed:    The following studies were reviewed today:  Echo 07/04/20 1. Left ventricular ejection fraction, by estimation, is 60 to 65%. The  left ventricle has normal function. The left ventricle has no regional  wall motion abnormalities. Left ventricular diastolic parameters are  indeterminate.  2. Right ventricular systolic function is normal. The right ventricular  size is normal.  3. Left atrial size was moderately dilated.   Myoview stress test 06/2016 Narrative & Impression   Blood pressure demonstrated a normal response to exercise.  There was no ST segment deviation noted during stress.  No T wave inversion was noted during stress.  The study is normal.  This is a low risk study.  The left ventricular ejection fraction is hyperdynamic (>65%).      EKG:  EKG is  ordered today.  The ekg ordered today demonstrates Aflutter with variable block, 84bpm, no acute changes  Recent Labs: 06/07/2020: Magnesium 2.0 07/26/2020: BUN 21;  Creatinine, Ser 0.80; Potassium 4.2; Sodium 140; TSH 0.764 08/06/2020: ALT 22 08/15/2020: Hemoglobin 13.7; Platelets 225  Recent Lipid Panel    Component Value Date/Time   CHOL 158 07/26/2020 0926   TRIG 61 07/26/2020 0926   HDL 79 07/26/2020 0926   CHOLHDL 2.0 07/26/2020 0926   VLDL 12 07/26/2020 0926   LDLCALC 67 07/26/2020 0926    Physical Exam:    VS:  There were no vitals taken for this visit.    Wt Readings from Last 3 Encounters:  07/30/20 126 lb (57.2 kg)  07/12/20 128 lb (58.1 kg)  07/10/20 129 lb (58.5 kg)     GEN:  Well nourished, well developed in no acute distress HEENT: Normal NECK: No JVD; No carotid bruits LYMPHATICS: No lymphadenopathy CARDIAC: RRR, no murmurs, rubs, gallops RESPIRATORY:  Clear to auscultation without rales, wheezing or rhonchi  ABDOMEN: Soft, non-tender, non-distended MUSCULOSKELETAL:  Trace lower leg edema; No deformity  SKIN: Warm and dry NEUROLOGIC:  Alert and oriented x 3 PSYCHIATRIC:  Normal affect   ASSESSMENT:    1. Persistent atrial fibrillation ([place])   2. Essential hypertension    PLAN:    In order of problems listed above:  Persistent Afib/flutter Patient underwent unsuccessful cardioversion 5/17 and started on oral amiodarone. She took 400mg  BID for 12 days and then was supposed to go down to 200mg  daily. Patient, however, says she has 100mg  tablets so she has been taking 2 of them. Pharmacy said they only gave 200mg  tablets, so it's possible she has been taking 400mg  daily. Patient said she will go home and check her dose. Instructed she should be taking 200mg  daily. She has been taking Eliquis 2.5mg  BID and denies missing doses. She reports stable DOE, but has noted some lower leg edema over the last few days. Trace edema noted on exam, lungs are clear and no JVD appreciated. She takes lasix 20mg  daily. Weight is the same as the last visit and RedsVest 27%. Continue daily weights, low salt diet. Recommend compression socks and  leg elevation. CHF signs/symptoms discussed with the patient. Continue to monitor LLE and can take extra lasix PRN. We will set patient up to undergo repeat cardioversion and see her back after procedure.   HTN BP wnl. Continue current regiment.    Disposition: Follow up in 2-3 week(s) with MD   Shared Decision Making/Informed Consent   Shared Decision Making/Informed Consent The risks (stroke, cardiac arrhythmias rarely resulting in the need for a temporary or permanent pacemaker, skin irritation or burns and complications associated with conscious sedation including aspiration, arrhythmia, respiratory failure and death), benefits (restoration of normal sinus rhythm) and alternatives of a direct current cardioversion were explained in detail to Ms. Kump and she agrees to proceed.    Signed, Hye Trawick Ninfa Meeker, PA-C  08/15/2020 9:01 AM    [facility]

## 2020-08-14 NOTE — Progress Notes (Signed)
Cardiology Office Note:    Date:  08/15/2020   ID:  Erin [name], DOB 04/19/1930, MRN [id number]  PCP:  Einar Pheasant, MD  [facility] Cardiologist:  Nelva Bush, MD  [facility] Electrophysiologist:  None   Referring MD: Einar Pheasant, MD   Chief Complaint: post cardioversion follow-up  History of Present Illness:    Erin [name] is a 87 y.o. female with a hx of persistent afib, HTN, HLD, and breast cancer who presents post-cardioversion.   Seen by PCP in March who noted she was in afib and she was recommended to see cardiologist. She was seen in the cardiology office and was in rate controlled afib. She was asymptomatic. She was planning to undergo lumpectomy and lymph node dissection the next week so cardioversion was held. Metoprolol was increased to 100mg  daily for better rate control.  Seen 07/12/20 and was in afib. She restarted eliquis 06/27/20 after the lumpectomy. She reported compliance with Eliquis and was set up for cardioversion. She underwent cardioversion 4/29 and it was unsuccessful converting back to afib/flutter. Plan was to admit for IV amiodarone load, however after discussion she was discharged on oral amiodarone.   Today, she remains in aflutter with rate of 84bpm. She says she is feeling about the same. She took amiodarone 400mg  BID for 12 days as instructed and was supposed to be taking 200mg  daily, but she thinks she has been taking 100mg  twce daily. We will check with the pharmacy. She has stable shortness of breath on exertion.  No chest pain, palpitations, orthopnea, pnd. Reports some lower leg edema for the last few days, trace edema on exam. She is taking lasix 20mg  daily. She does not wear compression socks or elevate her legs. She eats a low salt diet. Reports she hasn't missed any doses of eliquis.    Past Medical History:  Diagnosis Date  . A-fib ([place])   . Breast cancer ([place])   . Cancer (HCC)    melanoma - arm left  . Chest pain 07/13/2016   . History of colon polyps   . Hypercholesterolemia   . Hypertension     Past Surgical History:  Procedure Laterality Date  . APPENDECTOMY  1946  . BREAST BIOPSY Left 06/04/2020   [place] bx, vision marker, path pending  . BREAST LUMPECTOMY WITH SENTINEL LYMPH NODE BIOPSY Left 06/24/2020   Procedure: BREAST LUMPECTOMY WITH SENTINEL LYMPH NODE BIOPSY;  Surgeon: Robert Bellow, MD;  Location: [facility] ORS;  Service: General;  Laterality: Left;  . CARDIOVERSION N/A 07/30/2020   Procedure: CARDIOVERSION;  Surgeon: Nelva Bush, MD;  Location: [facility] ORS;  Service: Cardiovascular;  Laterality: N/A;  . CATARACT EXTRACTION Bilateral   . DILATION AND CURETTAGE OF UTERUS    . LAMINECTOMY  2000  . release of foraminal stenosis  2003    Current Medications: No outpatient medications have been marked as taking for the 08/15/20 encounter (Appointment) with Kathlen Mody, Alexsandra Shontz H, PA-C.     Allergies:   Erythromycin and Minocin [minocycline hcl]   Social History   Socioeconomic History  . Marital status: Widowed    Spouse name: Not on file  . Number of children: 4  . Years of education: Not on file  . Highest education level: Not on file  Occupational History  . Occupation: retired  Tobacco Use  . Smoking status: Former Research scientist (life sciences)  . Smokeless tobacco: Never Used  Vaping Use  . Vaping Use: Never used  Substance and Sexual Activity  . Alcohol  use: Yes    Alcohol/week: 0.0 standard drinks    Comment: occassionally  . Drug use: No  . Sexual activity: Never  Other Topics Concern  . Not on file  Social History Narrative  . Not on file   Social Determinants of Health   Financial Resource Strain: Low Risk   . Difficulty of Paying Living Expenses: Not hard at all  Food Insecurity: No Food Insecurity  . Worried About Charity fundraiser in the Last Year: Never true  . Ran Out of Food in the Last Year: Never true  Transportation Needs: No Transportation Needs  . Lack of Transportation (Medical):  No  . Lack of Transportation (Non-Medical): No  Physical Activity: Sufficiently Active  . Days of Exercise per Week: 5 days  . Minutes of Exercise per Session: 30 min  Stress: No Stress Concern Present  . Feeling of Stress : Not at all  Social Connections: Unknown  . Frequency of Communication with Friends and Family: More than three times a week  . Frequency of Social Gatherings with Friends and Family: Twice a week  . Attends Religious Services: More than 4 times per year  . Active Member of Clubs or Organizations: Yes  . Attends Archivist Meetings: Not on file  . Marital Status: Not on file     Family History: The patient's *family history includes Breast cancer in her maternal aunt; Heart disease in her brother; Hypertension in her mother and sister; Multiple sclerosis in her sister.  ROS:   Please see the history of present illness.     All other systems reviewed and are negative.  EKGs/Labs/Other Studies Reviewed:    The following studies were reviewed today:  Echo 07/04/20 1. Left ventricular ejection fraction, by estimation, is 60 to 65%. The  left ventricle has normal function. The left ventricle has no regional  wall motion abnormalities. Left ventricular diastolic parameters are  indeterminate.  2. Right ventricular systolic function is normal. The right ventricular  size is normal.  3. Left atrial size was moderately dilated.   Myoview stress test 06/2016 Narrative & Impression   Blood pressure demonstrated a normal response to exercise.  There was no ST segment deviation noted during stress.  No T wave inversion was noted during stress.  The study is normal.  This is a low risk study.  The left ventricular ejection fraction is hyperdynamic (>65%).      EKG:  EKG is  ordered today.  The ekg ordered today demonstrates Aflutter with variable block, 84bpm, no acute changes  Recent Labs: 06/07/2020: Magnesium 2.0 07/26/2020: BUN 21;  Creatinine, Ser 0.80; Potassium 4.2; Sodium 140; TSH 0.764 08/06/2020: ALT 22 08/15/2020: Hemoglobin 13.7; Platelets 225  Recent Lipid Panel    Component Value Date/Time   CHOL 158 07/26/2020 0926   TRIG 61 07/26/2020 0926   HDL 79 07/26/2020 0926   CHOLHDL 2.0 07/26/2020 0926   VLDL 12 07/26/2020 0926   LDLCALC 67 07/26/2020 0926    Physical Exam:    VS:  There were no vitals taken for this visit.    Wt Readings from Last 3 Encounters:  07/30/20 126 lb (57.2 kg)  07/12/20 128 lb (58.1 kg)  07/10/20 129 lb (58.5 kg)     GEN:  Well nourished, well developed in no acute distress HEENT: Normal NECK: No JVD; No carotid bruits LYMPHATICS: No lymphadenopathy CARDIAC: RRR, no murmurs, rubs, gallops RESPIRATORY:  Clear to auscultation without rales, wheezing or rhonchi  ABDOMEN: Soft, non-tender, non-distended MUSCULOSKELETAL:  Trace lower leg edema; No deformity  SKIN: Warm and dry NEUROLOGIC:  Alert and oriented x 3 PSYCHIATRIC:  Normal affect   ASSESSMENT:    1. Persistent atrial fibrillation ([place])   2. Essential hypertension    PLAN:    In order of problems listed above:  Persistent Afib/flutter Patient underwent unsuccessful cardioversion 5/17 and started on oral amiodarone. She took 400mg  BID for 12 days and then was supposed to go down to 200mg  daily. Patient, however, says she has 100mg  tablets so she has been taking 2 of them. Pharmacy said they only gave 200mg  tablets, so it's possible she has been taking 400mg  daily. Patient said she will go home and check her dose. Instructed she should be taking 200mg  daily. She has been taking Eliquis 2.5mg  BID and denies missing doses. She reports stable DOE, but has noted some lower leg edema over the last few days. Trace edema noted on exam, lungs are clear and no JVD appreciated. She takes lasix 20mg  daily. Weight is the same as the last visit and RedsVest 27%. Continue daily weights, low salt diet. Recommend compression socks and  leg elevation. CHF signs/symptoms discussed with the patient. Continue to monitor LLE and can take extra lasix PRN. We will set patient up to undergo repeat cardioversion and see her back after procedure.   HTN BP wnl. Continue current regiment.    Disposition: Follow up in 2-3 week(s) with MD   Shared Decision Making/Informed Consent   Shared Decision Making/Informed Consent The risks (stroke, cardiac arrhythmias rarely resulting in the need for a temporary or permanent pacemaker, skin irritation or burns and complications associated with conscious sedation including aspiration, arrhythmia, respiratory failure and death), benefits (restoration of normal sinus rhythm) and alternatives of a direct current cardioversion were explained in detail to Ms. Peed and she agrees to proceed.    Signed, Adon Gehlhausen Ninfa Meeker, PA-C  08/15/2020 9:01 AM    [facility]

## 2020-08-15 ENCOUNTER — Inpatient Hospital Stay: Payer: PPO | Attending: Radiation Oncology

## 2020-08-15 ENCOUNTER — Other Ambulatory Visit: Payer: Self-pay

## 2020-08-15 ENCOUNTER — Ambulatory Visit
Admission: RE | Admit: 2020-08-15 | Discharge: 2020-08-15 | Disposition: A | Discharge status: Home / Self Care | Payer: PPO | Source: Ambulatory Visit | Attending: Radiation Oncology | Admitting: Radiation Oncology

## 2020-08-15 ENCOUNTER — Encounter: Payer: Self-pay | Admitting: Medical

## 2020-08-15 ENCOUNTER — Other Ambulatory Visit: Payer: Self-pay | Admitting: Pharmacist

## 2020-08-15 ENCOUNTER — Telehealth: Payer: Self-pay | Admitting: Medical

## 2020-08-15 ENCOUNTER — Ambulatory Visit: Payer: PPO

## 2020-08-15 ENCOUNTER — Ambulatory Visit: Payer: PPO | Admitting: Medical

## 2020-08-15 VITALS — BP 110/70 | HR 84 | Ht 60.0 in | Wt 129.0 lb

## 2020-08-15 DIAGNOSIS — I4891 Unspecified atrial fibrillation: Secondary | ICD-10-CM | POA: Diagnosis not present

## 2020-08-15 DIAGNOSIS — Z510 Encounter for antineoplastic radiation therapy: Secondary | ICD-10-CM | POA: Diagnosis not present

## 2020-08-15 DIAGNOSIS — Z923 Personal history of irradiation: Secondary | ICD-10-CM | POA: Insufficient documentation

## 2020-08-15 DIAGNOSIS — I4819 Other persistent atrial fibrillation: Secondary | ICD-10-CM

## 2020-08-15 DIAGNOSIS — Z79899 Other long term (current) drug therapy: Secondary | ICD-10-CM | POA: Insufficient documentation

## 2020-08-15 DIAGNOSIS — I10 Essential (primary) hypertension: Secondary | ICD-10-CM | POA: Diagnosis not present

## 2020-08-15 DIAGNOSIS — C50412 Malignant neoplasm of upper-outer quadrant of left female breast: Secondary | ICD-10-CM | POA: Insufficient documentation

## 2020-08-15 DIAGNOSIS — Z170 Estrogen receptor positive status [ER+]: Secondary | ICD-10-CM | POA: Insufficient documentation

## 2020-08-15 DIAGNOSIS — M8580 Other specified disorders of bone density and structure, unspecified site: Secondary | ICD-10-CM | POA: Insufficient documentation

## 2020-08-15 DIAGNOSIS — Z7901 Long term (current) use of anticoagulants: Secondary | ICD-10-CM | POA: Insufficient documentation

## 2020-08-15 LAB — CBC
HCT: 41.3 % (ref 36.0–46.0)
Hemoglobin: 13.7 g/dL (ref 12.0–15.0)
MCH: 32.2 pg (ref 26.0–34.0)
MCHC: 33.2 g/dL (ref 30.0–36.0)
MCV: 97.2 fL (ref 80.0–100.0)
Platelets: 225 10*3/uL (ref 150–400)
RBC: 4.25 MIL/uL (ref 3.87–5.11)
RDW: 13.1 % (ref 11.5–15.5)
WBC: 4.2 10*3/uL (ref 4.0–10.5)
nRBC: 0 % (ref 0.0–0.2)

## 2020-08-15 MED ORDER — APIXABAN 2.5 MG PO TABS: 2.5000 mg | ORAL_TABLET | Freq: Two times a day (BID) | ORAL | 1 refills | Status: DC

## 2020-08-15 MED ORDER — AMIODARONE HCL 200 MG PO TABS: 200.0000 mg | ORAL_TABLET | Freq: Every day | ORAL | 3 refills | Status: DC

## 2020-08-15 NOTE — Telephone Encounter (Signed)
Patient is requesting a sample of Amiodarone took wrong dose pharmacy [facility] in [place] approve 12days needed.

## 2020-08-15 NOTE — Telephone Encounter (Signed)
Prescription refill request for Eliquis received. Indication: A Fib Last office visit: 08/15/20 Scr: 0.8 Age: 87 Weight: 58.5kg

## 2020-08-15 NOTE — Telephone Encounter (Addendum)
Spoke with the patient. Advised her that we do not have samples of Amiodarone since it has been available in generic form for many years. Patient was instructed at todays o/v that she will need to continue taking Amiodarone 200 mg daily.  Patient sts that she will be a couple of tablets short for the month. She took the wrong dose for a few days. Adv the patient to contact her pharmacy and make them aware. Adv her that she may need to pay for a couple of tablets out of pocket until her prescription coverage picks up the next refill. Patient verbalized understanding and voiced appreciation for the assistance.

## 2020-08-15 NOTE — Patient Instructions (Signed)
Medication Instructions:  Your physician recommends that you continue on your current medications as directed. Please refer to the Current Medication list given to you today.   *If you need a refill on your cardiac medications before your next appointment, please call your pharmacy*   Lab Work:  Your physician recommends that you return for lab work in: One week prior to your cardioversion. (BMP, CBC)    Testing/Procedures:  You are scheduled for a Cardioversion on _Tues. 6/21/22_ with Dr.__End_________ Please arrive at the [facility] at __0630_______ a.m. on the day of your procedure.  DIET INSTRUCTIONS:  Nothing to eat or drink after midnight except your medications with a sip of water.         Labs: __to be done within one week of your Cardioversion 1) Medications:  YOU MAY TAKE ALL of your remaining medications with a small amount of water.  2) Must have a responsible person to drive you home.  3) Bring a current list of your medications and current insurance cards.    If you have any questions after you get home, please call the office at [phone]   Follow-Up: At [facility], you and your health needs are our priority.  As part of our continuing mission to provide you with exceptional heart care, we have created designated Provider Care Teams.  These Care Teams include your primary Cardiologist (physician) and Advanced Practice Providers (APPs -  Physician Assistants and Nurse Practitioners) who all work together to provide you with the care you need, when you need it.  We recommend signing up for the patient portal called "MyChart".  Sign up information is provided on this After Visit Summary.  MyChart is used to connect with patients for Virtual Visits (Telemedicine).  Patients are able to view lab/test results, encounter notes, upcoming appointments, etc.  Non-urgent messages can be sent to your provider as well.   To learn more about what you can do with  MyChart, go to [email or web address].    Your next appointment:   3-4 week(s)  The format for your next appointment:   In Person  Provider:   You may see Nelva Bush, MD or one of the following Advanced Practice Providers on your designated Care Team:    Murray Hodgkins, NP  Christell Faith, PA-C  Marrianne Mood, PA-C  Cadence Arlee, [place]  Laurann Montana, NP    Other Instructions  \

## 2020-08-16 ENCOUNTER — Ambulatory Visit
Admission: RE | Admit: 2020-08-16 | Discharge: 2020-08-16 | Disposition: A | Discharge status: Home / Self Care | Payer: PPO | Source: Ambulatory Visit | Attending: Radiation Oncology | Admitting: Radiation Oncology

## 2020-08-16 DIAGNOSIS — Z510 Encounter for antineoplastic radiation therapy: Secondary | ICD-10-CM | POA: Diagnosis not present

## 2020-08-19 ENCOUNTER — Ambulatory Visit
Admission: RE | Admit: 2020-08-19 | Discharge: 2020-08-19 | Disposition: A | Discharge status: Home / Self Care | Payer: PPO | Source: Ambulatory Visit | Attending: Radiation Oncology | Admitting: Radiation Oncology

## 2020-08-19 DIAGNOSIS — Z510 Encounter for antineoplastic radiation therapy: Secondary | ICD-10-CM | POA: Diagnosis not present

## 2020-08-20 ENCOUNTER — Ambulatory Visit
Admission: RE | Admit: 2020-08-20 | Discharge: 2020-08-20 | Disposition: A | Discharge status: Home / Self Care | Payer: PPO | Source: Ambulatory Visit | Attending: Radiation Oncology | Admitting: Radiation Oncology

## 2020-08-20 DIAGNOSIS — Z510 Encounter for antineoplastic radiation therapy: Secondary | ICD-10-CM | POA: Diagnosis not present

## 2020-08-21 ENCOUNTER — Ambulatory Visit
Admission: RE | Admit: 2020-08-21 | Discharge: 2020-08-21 | Disposition: A | Discharge status: Home / Self Care | Payer: PPO | Source: Ambulatory Visit | Attending: Radiation Oncology | Admitting: Radiation Oncology

## 2020-08-21 DIAGNOSIS — C50412 Malignant neoplasm of upper-outer quadrant of left female breast: Secondary | ICD-10-CM | POA: Diagnosis not present

## 2020-08-21 DIAGNOSIS — Z170 Estrogen receptor positive status [ER+]: Secondary | ICD-10-CM | POA: Diagnosis not present

## 2020-08-21 DIAGNOSIS — Z510 Encounter for antineoplastic radiation therapy: Secondary | ICD-10-CM | POA: Diagnosis not present

## 2020-08-22 ENCOUNTER — Ambulatory Visit
Admission: RE | Admit: 2020-08-22 | Discharge: 2020-08-22 | Disposition: A | Discharge status: Home / Self Care | Payer: PPO | Source: Ambulatory Visit | Attending: Radiation Oncology | Admitting: Radiation Oncology

## 2020-08-22 DIAGNOSIS — Z510 Encounter for antineoplastic radiation therapy: Secondary | ICD-10-CM | POA: Diagnosis not present

## 2020-08-23 ENCOUNTER — Ambulatory Visit
Admission: RE | Admit: 2020-08-23 | Discharge: 2020-08-23 | Disposition: A | Discharge status: Home / Self Care | Payer: PPO | Source: Ambulatory Visit | Attending: Radiation Oncology | Admitting: Radiation Oncology

## 2020-08-23 DIAGNOSIS — Z510 Encounter for antineoplastic radiation therapy: Secondary | ICD-10-CM | POA: Diagnosis not present

## 2020-08-26 ENCOUNTER — Ambulatory Visit: Payer: PPO

## 2020-08-26 ENCOUNTER — Ambulatory Visit
Admission: RE | Admit: 2020-08-26 | Discharge: 2020-08-26 | Disposition: A | Discharge status: Home / Self Care | Payer: PPO | Source: Ambulatory Visit | Attending: Radiation Oncology | Admitting: Radiation Oncology

## 2020-08-26 DIAGNOSIS — Z510 Encounter for antineoplastic radiation therapy: Secondary | ICD-10-CM | POA: Diagnosis not present

## 2020-08-27 ENCOUNTER — Ambulatory Visit
Admission: RE | Admit: 2020-08-27 | Discharge: 2020-08-27 | Disposition: A | Discharge status: Home / Self Care | Payer: PPO | Source: Ambulatory Visit | Attending: Radiation Oncology | Admitting: Radiation Oncology

## 2020-08-27 ENCOUNTER — Other Ambulatory Visit
Admission: RE | Admit: 2020-08-27 | Discharge: 2020-08-27 | Disposition: A | Discharge status: Home / Self Care | Payer: PPO | Attending: Medical | Admitting: Medical

## 2020-08-27 DIAGNOSIS — C50412 Malignant neoplasm of upper-outer quadrant of left female breast: Secondary | ICD-10-CM | POA: Diagnosis not present

## 2020-08-27 DIAGNOSIS — Z510 Encounter for antineoplastic radiation therapy: Secondary | ICD-10-CM | POA: Diagnosis not present

## 2020-08-27 DIAGNOSIS — I10 Essential (primary) hypertension: Secondary | ICD-10-CM

## 2020-08-27 DIAGNOSIS — Z170 Estrogen receptor positive status [ER+]: Secondary | ICD-10-CM | POA: Diagnosis not present

## 2020-08-27 DIAGNOSIS — I4819 Other persistent atrial fibrillation: Secondary | ICD-10-CM

## 2020-08-27 LAB — BASIC METABOLIC PANEL
Anion gap: 4 — ABNORMAL LOW (ref 5–15)
BUN: 18 mg/dL (ref 8–23)
CO2: 29 mmol/L (ref 22–32)
Calcium: 9.6 mg/dL (ref 8.9–10.3)
Chloride: 102 mmol/L (ref 98–111)
Creatinine, Ser: 1.04 mg/dL — ABNORMAL HIGH (ref 0.44–1.00)
GFR, Estimated: 52 mL/min — ABNORMAL LOW (ref >60)
Glucose, Bld: 130 mg/dL — ABNORMAL HIGH (ref 70–99)
Potassium: 4.1 mmol/L (ref 3.5–5.1)
Sodium: 135 mmol/L (ref 135–145)

## 2020-08-27 LAB — CBC
HCT: 40.9 % (ref 36.0–46.0)
Hemoglobin: 13.9 g/dL (ref 12.0–15.0)
MCH: 32.6 pg (ref 26.0–34.0)
MCHC: 34 g/dL (ref 30.0–36.0)
MCV: 95.8 fL (ref 80.0–100.0)
Platelets: 181 10*3/uL (ref 150–400)
RBC: 4.27 MIL/uL (ref 3.87–5.11)
RDW: 13.5 % (ref 11.5–15.5)
WBC: 5.2 10*3/uL (ref 4.0–10.5)
nRBC: 0 % (ref 0.0–0.2)

## 2020-08-28 ENCOUNTER — Telehealth: Payer: Self-pay | Admitting: Medical

## 2020-08-28 NOTE — Telephone Encounter (Signed)
I spoke with the patient regarding her lab results. She voices understanding of these results.  I also inquired about her weight/ edema/ sob. Per the patient, she feels her weight is stable and she is not having any swelling.  She states she does not get what she would call "short of breath," but her energy level has decreased. She can't walk longer than 25 minutes without feeling fatigued.   I asked how often she was taking her lasix 20 mg and she advised she is taking this "almost every other day."  I asked what happens when she spaces this out more than every other day and she advised "I feel puffy."  The patient is aware to continue her current medications for now, but is aware we will review the above with Cadence and call her back with recommendations.  She inquired if her DCCV is still scheduled for 09/03/20. I advised her these labs will not affect her DCCV performed and she may proceed with this as scheduled. I have reviewed with her that if she can be restored to NSR that this may help her fatigue and decrease her need for lasix QOD.  The patient voices understanding and is agreeable.

## 2020-08-28 NOTE — Telephone Encounter (Signed)
Cadence Ninfa Meeker, PA-C  08/28/2020  8:40 AM EDT      Labs show kidney function mildly elevated. Ask about her volume status (weightup, LLE, breathing) also if she has needed extra lasix?

## 2020-08-28 NOTE — Telephone Encounter (Signed)
Patient returning call.

## 2020-08-28 NOTE — Telephone Encounter (Signed)
Attempted to call the patient. No answer- I left a message to please call back.  

## 2020-09-02 NOTE — Telephone Encounter (Signed)
Courtenay, Hirth - 08/28/2020 10:42 AM Antony Madura, PA-C  Sent: Sun September 01, 2020  8:46 PM  To: Emily Filbert, RN          Message  Thank you for the update.     - Patient is scheduled for her DCCV on 09/03/20 with Dr. Saunders Revel. - She is scheduled for a post DCCV office visit on 09/12/20 with Dr. Saunders Revel.

## 2020-09-03 ENCOUNTER — Ambulatory Visit
Admission: RE | Admit: 2020-09-03 | Discharge: 2020-09-03 | Disposition: A | Discharge status: Home / Self Care | Payer: PPO | Attending: Internal Medicine | Admitting: Internal Medicine

## 2020-09-03 ENCOUNTER — Ambulatory Visit: Payer: PPO | Admitting: Certified Registered Nurse Anesthetist

## 2020-09-03 ENCOUNTER — Encounter: Payer: Self-pay | Admitting: Internal Medicine

## 2020-09-03 ENCOUNTER — Encounter
Admission: RE | Disposition: A | Discharge status: Home / Self Care | Payer: Self-pay | Source: Home / Self Care | Attending: Internal Medicine

## 2020-09-03 ENCOUNTER — Other Ambulatory Visit: Payer: Self-pay

## 2020-09-03 DIAGNOSIS — Z853 Personal history of malignant neoplasm of breast: Secondary | ICD-10-CM | POA: Diagnosis not present

## 2020-09-03 DIAGNOSIS — Z8249 Family history of ischemic heart disease and other diseases of the circulatory system: Secondary | ICD-10-CM | POA: Diagnosis not present

## 2020-09-03 DIAGNOSIS — Z7901 Long term (current) use of anticoagulants: Secondary | ICD-10-CM | POA: Insufficient documentation

## 2020-09-03 DIAGNOSIS — I10 Essential (primary) hypertension: Secondary | ICD-10-CM | POA: Diagnosis not present

## 2020-09-03 DIAGNOSIS — Z85820 Personal history of malignant melanoma of skin: Secondary | ICD-10-CM | POA: Insufficient documentation

## 2020-09-03 DIAGNOSIS — I4819 Other persistent atrial fibrillation: Secondary | ICD-10-CM | POA: Diagnosis not present

## 2020-09-03 DIAGNOSIS — Z79899 Other long term (current) drug therapy: Secondary | ICD-10-CM | POA: Diagnosis not present

## 2020-09-03 DIAGNOSIS — Z881 Allergy status to other antibiotic agents status: Secondary | ICD-10-CM | POA: Insufficient documentation

## 2020-09-03 DIAGNOSIS — I4892 Unspecified atrial flutter: Secondary | ICD-10-CM | POA: Insufficient documentation

## 2020-09-03 DIAGNOSIS — E785 Hyperlipidemia, unspecified: Secondary | ICD-10-CM | POA: Diagnosis not present

## 2020-09-03 DIAGNOSIS — E7800 Pure hypercholesterolemia, unspecified: Secondary | ICD-10-CM | POA: Diagnosis not present

## 2020-09-03 DIAGNOSIS — Z87891 Personal history of nicotine dependence: Secondary | ICD-10-CM | POA: Insufficient documentation

## 2020-09-03 DIAGNOSIS — I4891 Unspecified atrial fibrillation: Secondary | ICD-10-CM | POA: Diagnosis not present

## 2020-09-03 HISTORY — PX: CARDIOVERSION: SHX1299

## 2020-09-03 SURGERY — CARDIOVERSION
Anesthesia: General

## 2020-09-03 MED ORDER — SODIUM CHLORIDE 0.9 % IV SOLN: INTRAVENOUS | Status: DC | PRN

## 2020-09-03 MED ORDER — PROPOFOL 10 MG/ML IV BOLUS
INTRAVENOUS | Status: AC
  Filled 2020-09-03: qty 20

## 2020-09-03 MED ORDER — PROPOFOL 10 MG/ML IV BOLUS
INTRAVENOUS | Status: DC | PRN
  Administered 2020-09-03: 50 mg via INTRAVENOUS

## 2020-09-03 NOTE — Progress Notes (Signed)
Patient walked around unit. Heart rate in the 60's while ambulating. Sats at 100%. Met Dr. Saunders Revel in the hallway states she may go home.

## 2020-09-03 NOTE — CV Procedure (Signed)
    Cardioversion Note  JEILANI GRUPE [id number] 06-01-1930  Procedure: DC Cardioversion Indications: Atrial flutter  Procedure Details Consent: Obtained Time Out: Verified patient identification, verified procedure, site/side was marked, verified correct patient position, special equipment/implants available, Radiology Safety Procedures followed,  medications/allergies/relevent history reviewed, required imaging and test results available.  Performed  The patient has been on adequate anticoagulation.  The patient received IV propofol for sedation.  Synchronous cardioversion was performed at 50 joules x 1 and 200 joules x 1.  The first shock converted atrial flutter to atrial fibrillation.  Second shock converted the patient to sinus bradycardia.  Complications: No apparent complications Patient did tolerate procedure well.  Recommendations: Monitor heart rate following cardioversion in recovery area.  If patient has symptomatic bradycardia, may need to monitor overnight. Hold metoprolol succinate. Continue amiodarone and apixaban.  Nelva Bush., MD 09/03/2020, 7:36 AM

## 2020-09-03 NOTE — Transfer of Care (Signed)
Immediate Anesthesia Transfer of Care Note  Patient: Erin [name]  Procedure(s) Performed: CARDIOVERSION  Patient Location: Cath Lab  Anesthesia Type:General  Level of Consciousness: awake, alert  and oriented  Airway & Oxygen Therapy: Patient Spontanous Breathing and Patient connected to nasal cannula oxygen  Post-op Assessment: Report given to RN and Post -op Vital signs reviewed and stable  Post vital signs: Reviewed and stable  Last Vitals:  Vitals Value Taken Time  BP 93/57 09/03/20 0733  Temp    Pulse 36 09/03/20 0734  Resp 20 09/03/20 0734  SpO2 96 % 09/03/20 0734    Last Pain:  Vitals:   09/03/20 0654  TempSrc: Oral  PainSc: 0-No pain         Complications: No notable events documented.

## 2020-09-03 NOTE — Anesthesia Preprocedure Evaluation (Signed)
Anesthesia Evaluation  Patient identified by MRN, date of birth, ID band Patient awake    Reviewed: Allergy & Precautions, H&P , NPO status , Patient's Chart, lab work & pertinent test results, reviewed documented beta blocker date and time   Airway Mallampati: II   Neck ROM: full    Dental  (+) Poor Dentition   Pulmonary neg pulmonary ROS, former smoker,    Pulmonary exam normal        Cardiovascular Exercise Tolerance: Poor hypertension, On Medications negative cardio ROS  Atrial Fibrillation  Rhythm:regular Rate:Normal     Neuro/Psych negative neurological ROS  negative psych ROS   GI/Hepatic negative GI ROS, Neg liver ROS,   Endo/Other  negative endocrine ROS  Renal/GU negative Renal ROS  negative genitourinary   Musculoskeletal   Abdominal   Peds  Hematology negative hematology ROS (+)   Anesthesia Other Findings Past Medical History: No date: A-fib [facility]) No date: Breast cancer ([place]) No date: Cancer [facility])     Comment:  melanoma - arm left 07/13/2016: Chest pain No date: History of colon polyps No date: Hypercholesterolemia No date: Hypertension Past Surgical History: 1946: APPENDECTOMY 06/04/2020: BREAST BIOPSY; Left     Comment:  [place] bx, vision marker, path pending 06/24/2020: BREAST LUMPECTOMY WITH SENTINEL LYMPH NODE BIOPSY; Left     Comment:  Procedure: BREAST LUMPECTOMY WITH SENTINEL LYMPH NODE               BIOPSY;  Surgeon: Robert Bellow, MD;  Location: [facility]              ORS;  Service: General;  Laterality: Left; 07/30/2020: CARDIOVERSION; N/A     Comment:  Procedure: CARDIOVERSION;  Surgeon: Nelva Bush,               MD;  Location: [facility] ORS;  Service: Cardiovascular;                Laterality: N/A; No date: CATARACT EXTRACTION; Bilateral No date: DILATION AND CURETTAGE OF UTERUS 2000: LAMINECTOMY 2003: release of foraminal stenosis BMI    Body Mass Index: 24.80 kg/m      Reproductive/Obstetrics negative OB ROS                             Anesthesia Physical Anesthesia Plan  ASA: 4  Anesthesia Plan: General   Post-op Pain Management:    Induction:   PONV Risk Score and Plan:   Airway Management Planned:   Additional Equipment:   Intra-op Plan:   Post-operative Plan:   Informed Consent: I have reviewed the patients History and Physical, chart, labs and discussed the procedure including the risks, benefits and alternatives for the proposed anesthesia with the patient or authorized representative who has indicated his/her understanding and acceptance.     Dental Advisory Given  Plan Discussed with: CRNA  Anesthesia Plan Comments:         Anesthesia Quick Evaluation

## 2020-09-03 NOTE — Interval H&P Note (Signed)
History and Physical Interval Note:  09/03/2020 7:18 AM  [name]  has presented today for surgery, with the diagnosis of atrial flutter.  The various methods of treatment have been discussed with the patient and family. After consideration of risks, benefits and other options for treatment, the patient has consented to  Procedure(s): CARDIOVERSION (N/A) as a surgical intervention.  The patient's history has been reviewed, patient examined, no change in status, stable for surgery.  I have reviewed the patient's chart and labs.  Questions were answered to the patient's satisfaction.     [name]

## 2020-09-04 NOTE — Anesthesia Postprocedure Evaluation (Signed)
Anesthesia Post Note  Patient: Erin [name]  Procedure(s) Performed: CARDIOVERSION  Patient location during evaluation: PACU Anesthesia Type: General Level of consciousness: awake and alert Pain management: pain level controlled Vital Signs Assessment: post-procedure vital signs reviewed and stable Respiratory status: spontaneous breathing, nonlabored ventilation, respiratory function stable and patient connected to nasal cannula oxygen Cardiovascular status: blood pressure returned to baseline and stable Postop Assessment: no apparent nausea or vomiting Anesthetic complications: no   No notable events documented.   Last Vitals:  Vitals:   09/03/20 0900 09/03/20 0930  BP: 126/68 134/71  Pulse: 63 (!) 36  Resp: 13 20  Temp:    SpO2: 90% 91%    Last Pain:  Vitals:   09/03/20 0930  TempSrc:   PainSc: 0-No pain                 Molli Barrows

## 2020-09-06 ENCOUNTER — Telehealth: Payer: Self-pay | Admitting: Medical

## 2020-09-06 MED ORDER — METOPROLOL SUCCINATE ER 25 MG PO TB24: 25.0000 mg | ORAL_TABLET | Freq: Every day | ORAL | 0 refills | Status: DC

## 2020-09-06 NOTE — Telephone Encounter (Signed)
Spoke with patient and reviewed recommendations to start metoprolol succinate 25 mg once daily until her appointment next week. She was agreeable with this plan and prescription sent in to her pharmacy. Instructed her to continue monitoring and we will see her at upcoming appt. She verbalized understanding with no further questions at this time.

## 2020-09-06 NOTE — Telephone Encounter (Signed)
STAT if HR is under 50 or over 120 (normal HR is 60-100 beats per minute)  What is your heart rate? > 100  Do you have a log of your heart rate readings (document readings)? This morning bp 115/67 HR 60 Now 182/95 HR 118   Do you have any other symptoms? Recent cardioverson feels funny now not sure what to do

## 2020-09-06 NOTE — Telephone Encounter (Signed)
Spoke with patient and she reviewed her normal routine. She reports having cardioversion on Tuesday. This morning blood pressure was 115/62 HR 60. Then went up to 182/95 HR 118. Then she said it was 151/87 with HR 79. Then right before I called she states was back down to 140/80 and HR 79. Instructed her to continue monitoring blood pressures and if she starts noticing persistently elevated readings to please give [place] a call back. She has upcoming appointment with Dr. Saunders Revel here in the office next week. She was worried about BP elevation over the weekend. Reviewed that we have on call service and they can assist if needed. She was reassured with this. She verbalized understanding of our conversation, agreement with plan, and had no further questions at this time.

## 2020-09-06 NOTE — Telephone Encounter (Signed)
Patient's metoprolol was stopped after cardioversion due to bradycardia.  Based on these HR/BP readings, let's have her start metoprolol succinate 25 mg daily and f/u as scheduled next week.  She should continue her other medications.

## 2020-09-09 ENCOUNTER — Inpatient Hospital Stay: Payer: PPO | Admitting: Internal Medicine

## 2020-09-09 ENCOUNTER — Encounter: Payer: Self-pay | Admitting: Internal Medicine

## 2020-09-09 DIAGNOSIS — Z170 Estrogen receptor positive status [ER+]: Secondary | ICD-10-CM

## 2020-09-09 DIAGNOSIS — C50412 Malignant neoplasm of upper-outer quadrant of left female breast: Secondary | ICD-10-CM

## 2020-09-09 NOTE — Assessment & Plan Note (Addendum)
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 STAGE-0- pTispN0; ER/PR-POSITIVE Her 2 NEGATIVE.  DCIS margin-positive/unifocal.  S/p lumpectomy; s/p radiation.  Radiation recommended given focal DCIS margin positive.  # Discussed that adjuvant/prophyltic antiestrogen therapy could be considered given the small focus of microinvasive cancer/DCIS.  However, this has to be HOLD given cardiac issues below.  I certainly believe the benefits of the antiestrogen therapy at her age/with her underlying comorbidities would be would be outweighed by the risks. Again it is reasonable not to initiate given the low risk disease/age medical problems. However, this could be considered down the line/neck 6 months if patient cardiac issues resolve/stabilized.    # A.fib [s/p cardioversion x2; Dr.End]- on Elquis; on metoprolol-clinically in A. Fib; no RVR.  Stable-cardiology  # Osteopenia [2020]-continue calcium plus vitamin D.  We will order bone density at next visit.  # DISPOSITION: # Follow up in 3rd  week of Jan 2023--MD; cbc/cmp- labs- Dr.B

## 2020-09-09 NOTE — Progress Notes (Signed)
[facility] CONSULT NOTE  Patient Care Team: Scott, Charlene, MD as PCP - General (Internal Medicine) End, Christopher, MD as PCP - Cardiology (Cardiology) Shaver, Anne F, RN as Oncology Nurse Navigator  CHIEF COMPLAINTS/PURPOSE OF CONSULTATION: Breast cancer  #  Oncology History Overview Note  Histologic type: Micro-invasive carcinoma arising in papillary carcinoma; [Papillary carcinoma [name] a  heterogenous group of malignant in situ and invasive neoplasms that are  staged and treated as pTis.  Arising from the papillary carcinoma [name] a single focus of microinvasive  carcinoma, measuring 0.44 mm. ]  Histologic grade (Nottingham Histologic Score)                       Glandular (Acinar)/Tubular Differentiation: 3                       Nuclear Pleomorphism: 2                       Mitotic Rate: 1                       Overall Grade: 2  Tumor Size: Microinvasion only (less than or equal to 1.0 mm)  Ductal carcinoma in situ (DCIS): Present       Positive for extensive intraductal component (EIC).  Lymphovascular Invasion: Not identified   ER/PR- POSITIVE; Her-2 negative.   #April 2022-s/p adjuvant radiation; given the risk versus benefit age-hold off adjuvant anastrozole.  # A.fib/eliquis [Dr.End];    Carcinoma of upper-outer quadrant of left breast in female, estrogen receptor positive (HCC)  07/10/2020 Initial Diagnosis   Carcinoma of upper-outer quadrant of left breast in female, estrogen receptor positive (HCC)     HISTORY OF PRESENTING ILLNESS:  Erin [name] 87 y.o.  female recently diagnosed microinvasive left breast cancer ER/PR/DCIS status post lumpectomy; positive margin status post radiation [name] here for follow-up.  Patient tolerated radiation fairly well.  However Erin [name] noted to have A. fib with RVR needing cardioversion x2.  And also adjustment of her beta-blockers.  Erin [name] currently on Eliquis.  No falls.  Patient denies any wound healing issues.  Healing  well.   Review of Systems  Constitutional:  Positive for malaise/fatigue. Negative for chills, diaphoresis, fever and weight loss.  HENT:  Negative for nosebleeds and sore throat.   Eyes:  Negative for double vision.  Respiratory:  Negative for cough, hemoptysis, sputum production, shortness of breath and wheezing.   Cardiovascular:  Negative for chest pain, palpitations, orthopnea and leg swelling.  Gastrointestinal:  Negative for abdominal pain, blood in stool, constipation, diarrhea, heartburn, melena, nausea and vomiting.  Genitourinary:  Negative for dysuria, frequency and urgency.  Musculoskeletal:  Positive for joint pain and myalgias. Negative for back pain.  Skin: Negative.  Negative for itching and rash.  Neurological:  Negative for dizziness, tingling, focal weakness, weakness and headaches.  Endo/Heme/Allergies:  Does not bruise/bleed easily.  Psychiatric/Behavioral:  Negative for depression. The patient [name] not nervous/anxious and does not have insomnia.     MEDICAL HISTORY:  Past Medical History:  Diagnosis Date  . A-fib (HCC)   . Breast cancer (HCC)   . Cancer (HCC)    melanoma - arm left  . Chest pain 07/13/2016  . History of colon polyps   . Hypercholesterolemia   . Hypertension     SURGICAL HISTORY: Past Surgical History:  Procedure Laterality Date  . APPENDECTOMY  1946  .   BREAST BIOPSY Left 06/04/2020   [place] bx, vision marker, path pending  . BREAST LUMPECTOMY WITH SENTINEL LYMPH NODE BIOPSY Left 06/24/2020   Procedure: BREAST LUMPECTOMY WITH SENTINEL LYMPH NODE BIOPSY;  Surgeon: Robert Bellow, MD;  Location: [facility] ORS;  Service: General;  Laterality: Left;  . CARDIOVERSION N/A 07/30/2020   Procedure: CARDIOVERSION;  Surgeon: Nelva Bush, MD;  Location: [facility] ORS;  Service: Cardiovascular;  Laterality: N/A;  . CARDIOVERSION N/A 09/03/2020   Procedure: CARDIOVERSION;  Surgeon: Nelva Bush, MD;  Location: [facility] ORS;  Service: Cardiovascular;  Laterality:  N/A;  . CATARACT EXTRACTION Bilateral   . DILATION AND CURETTAGE OF UTERUS    . LAMINECTOMY  2000  . release of foraminal stenosis  2003    SOCIAL HISTORY: Social History   Socioeconomic History  . Marital status: Widowed    Spouse name: Not on file  . Number of children: 4  . Years of education: Not on file  . Highest education level: Not on file  Occupational History  . Occupation: retired  Tobacco Use  . Smoking status: Former    Pack years: 0.00  . Smokeless tobacco: Never  Vaping Use  . Vaping Use: Never used  Substance and Sexual Activity  . Alcohol use: Yes    Alcohol/week: 0.0 standard drinks    Comment: occassionally  . Drug use: No  . Sexual activity: Never  Other Topics Concern  . Not on file  Social History Narrative  . Not on file   Social Determinants of Health   Financial Resource Strain: Low Risk   . Difficulty of Paying Living Expenses: Not hard at all  Food Insecurity: No Food Insecurity  . Worried About Charity fundraiser in the Last Year: Never true  . Ran Out of Food in the Last Year: Never true  Transportation Needs: No Transportation Needs  . Lack of Transportation (Medical): No  . Lack of Transportation (Non-Medical): No  Physical Activity: Sufficiently Active  . Days of Exercise per Week: 5 days  . Minutes of Exercise per Session: 30 min  Stress: No Stress Concern Present  . Feeling of Stress : Not at all  Social Connections: Unknown  . Frequency of Communication with Friends and Family: More than three times a week  . Frequency of Social Gatherings with Friends and Family: Twice a week  . Attends Religious Services: More than 4 times per year  . Active Member of Clubs or Organizations: Yes  . Attends Archivist Meetings: Not on file  . Marital Status: Not on file  Intimate Partner Violence: Not At Risk  . Fear of Current or Ex-Partner: No  . Emotionally Abused: No  . Physically Abused: No  . Sexually Abused: No     FAMILY HISTORY: Family History  Problem Relation Age of Onset  . Breast cancer Maternal Aunt   . Hypertension Mother   . Hypertension Sister   . Heart disease Brother        S/P CABG  . Multiple sclerosis Sister        died 3    ALLERGIES:  [name] allergic to erythromycin and minocin [minocycline hcl].  MEDICATIONS:  Current Outpatient Medications  Medication Sig Dispense Refill  . acetaminophen (TYLENOL) 325 MG tablet Take 650 mg by mouth every 6 (six) hours as needed for moderate pain or headache.    Marland Kitchen amiodarone (PACERONE) 200 MG tablet Take 1 tablet (200 mg total) by mouth daily. 60 tablet 3  .  apixaban (ELIQUIS) 2.5 MG TABS tablet Take 1 tablet (2.5 mg total) by mouth 2 (two) times daily. 180 tablet 1  . benazepril (LOTENSIN) 40 MG tablet Take 1 tablet (40 mg total) by mouth daily. 180 tablet 1  . Calcium Carbonate-Vitamin D 600-400 MG-UNIT tablet Take 1 tablet by mouth in the morning.    . felodipine (PLENDIL) 2.5 MG 24 hr tablet Take 1 tablet (2.5 mg total) by mouth daily. 90 tablet 1  . furosemide (LASIX) 20 MG tablet Take 1 tablet (20 mg total) by mouth daily as needed. (Patient taking differently: Take 20 mg by mouth daily as needed for edema.) 90 tablet 1  . metoprolol succinate (TOPROL XL) 25 MG 24 hr tablet Take 1 tablet (25 mg total) by mouth daily. 30 tablet 0  . Multiple Vitamin (MULTIVITAMIN WITH MINERALS) TABS tablet Take 1 tablet by mouth in the morning.    . NON FORMULARY Apply 1 application topically daily. Rejuva skin cream    . Omega-3 Fatty Acids (FISH OIL) 1000 MG CAPS Take 1,000 mg by mouth every evening.    . rosuvastatin (CRESTOR) 5 MG tablet Take 1 tablet (5 mg total) by mouth daily. 90 tablet 1   No current facility-administered medications for this visit.      Marland Kitchen  PHYSICAL EXAMINATION: ECOG PERFORMANCE STATUS: 0 - Asymptomatic  Vitals:   09/09/20 1005  BP: (!) 150/58  Pulse: (!) 59  Resp: 16  Temp: 98.7 F (37.1 C)  SpO2: 99%   Filed  Weights   09/09/20 1005  Weight: 130 lb (59 kg)    Physical Exam Constitutional:      Comments: Walks with a cane.  Alone.  HENT:     Head: Normocephalic and atraumatic.     Mouth/Throat:     Pharynx: No oropharyngeal exudate.  Eyes:     Pupils: Pupils are equal, round, and reactive to light.  Cardiovascular:     Rate and Rhythm: Normal rate. Rhythm irregular.  Pulmonary:     Effort: Pulmonary effort [name] normal. No respiratory distress.     Breath sounds: Normal breath sounds. No wheezing.  Abdominal:     General: Bowel sounds are normal. There [name] no distension.     Palpations: Abdomen [name] soft. There [name] no mass.     Tenderness: There [name] no abdominal tenderness. There [name] no guarding or rebound.  Musculoskeletal:        General: No tenderness. Normal range of motion.     Cervical back: Normal range of motion and neck supple.  Skin:    General: Skin [name] warm.  Neurological:     Mental Status: Erin [name] alert and oriented to person, place, and time.  Psychiatric:        Mood and Affect: Affect normal.     LABORATORY DATA:  I have reviewed the data as listed Lab Results  Component Value Date   WBC 5.2 08/27/2020   HGB 13.9 08/27/2020   HCT 40.9 08/27/2020   MCV 95.8 08/27/2020   PLT 181 08/27/2020   Recent Labs    05/13/20 0801 06/07/20 1527 07/26/20 0926 08/06/20 0957 08/27/20 0944  NA 138 142 140  --  135  K 4.1 4.6 4.2  --  4.1  CL 103 105 105  --  102  CO2 _0 --  29  GLUCOSE 98 92 97  --  130*  BUN _1 --  18  CREATININE 0.89 0.92* 0.80  --  1.04*  CALCIUM 10.4 10.6* 9.9  --  9.6  GFRNONAA  --   --  >60  --  52*  PROT 6.7  --  6.9 6.7  --   ALBUMIN 4.2  --  4.1 4.5  --   AST 20  --  59* 20  --   ALT 18  --  52* 22  --   ALKPHOS 51  --  77 71  --   BILITOT 0.7  --  0.9 0.6  --   BILIDIR 0.1  --  0.1 0.1  --   IBILI  --   --  0.8  --   --     RADIOGRAPHIC STUDIES: I have personally reviewed the radiological images as listed and agreed with  the findings in the report. No results found.   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 STAGE-0- pTispN0; ER/PR-POSITIVE Her 2 NEGATIVE.  DCIS margin-positive/unifocal.  S/p lumpectomy; s/p radiation.  Radiation recommended given focal DCIS margin positive.  # Discussed that adjuvant/prophyltic antiestrogen therapy could be considered given the small focus of microinvasive cancer/DCIS.  However, this has to be HOLD given cardiac issues below.  I certainly believe the benefits of the antiestrogen therapy at her age/with her underlying comorbidities would be would be outweighed by the risks. Again it [name] reasonable not to initiate given the low risk disease/age medical problems. However, this could be considered down the line/neck 6 months if patient cardiac issues resolve/stabilized.    # A.fib [s/p cardioversion x2; Dr.End]- on Elquis; on metoprolol-clinically in A. Fib; no RVR.  Stable-cardiology  # Osteopenia [2020]-continue calcium plus vitamin D.  We will order bone density at next visit.  # DISPOSITION: # Follow up in 3rd  week of Jan 2023--MD; cbc/cmp- labs- Dr.B  All questions were answered. The patient/family knows to call the clinic with any problems, questions or concerns.    Cammie Sickle, MD 09/09/2020 1:53 PM

## 2020-09-09 NOTE — Progress Notes (Signed)
Has been having issues with A-fib since march. Has been following up with cardiology. Had a cardioversion on 6/21.

## 2020-09-11 ENCOUNTER — Telehealth: Payer: Self-pay | Admitting: *Deleted

## 2020-09-11 DIAGNOSIS — I10 Essential (primary) hypertension: Secondary | ICD-10-CM

## 2020-09-11 DIAGNOSIS — E785 Hyperlipidemia, unspecified: Secondary | ICD-10-CM

## 2020-09-11 DIAGNOSIS — R739 Hyperglycemia, unspecified: Secondary | ICD-10-CM

## 2020-09-11 NOTE — Telephone Encounter (Signed)
Please place future orders for lab appt.    Or are we drawing labs for Dr Rogue Bussing?

## 2020-09-11 NOTE — Telephone Encounter (Signed)
Orders placed for labs.  Just need mine.  Thanks

## 2020-09-12 ENCOUNTER — Ambulatory Visit: Payer: PPO | Admitting: Internal Medicine

## 2020-09-12 ENCOUNTER — Other Ambulatory Visit: Payer: Self-pay

## 2020-09-12 VITALS — BP 142/60 | HR 52 | Ht 60.0 in | Wt 130.0 lb

## 2020-09-12 DIAGNOSIS — R079 Chest pain, unspecified: Secondary | ICD-10-CM

## 2020-09-12 DIAGNOSIS — I10 Essential (primary) hypertension: Secondary | ICD-10-CM | POA: Diagnosis not present

## 2020-09-12 DIAGNOSIS — I4819 Other persistent atrial fibrillation: Secondary | ICD-10-CM | POA: Diagnosis not present

## 2020-09-12 NOTE — Patient Instructions (Signed)
Medication Instructions:   Your physician recommends that you continue on your current medications as directed. Please refer to the Current Medication list given to you today.  *If you need a refill on your cardiac medications before your next appointment, please call your pharmacy*   Lab Work:  None ordered  Testing/Procedures:  None ordered   Follow-Up: At [facility], you and your health needs are our priority.  As part of our continuing mission to provide you with exceptional heart care, we have created designated Provider Care Teams.  These Care Teams include your primary Cardiologist (physician) and Advanced Practice Providers (APPs -  Physician Assistants and Nurse Practitioners) who all work together to provide you with the care you need, when you need it.  We recommend signing up for the patient portal called "MyChart".  Sign up information is provided on this After Visit Summary.  MyChart is used to connect with patients for Virtual Visits (Telemedicine).  Patients are able to view lab/test results, encounter notes, upcoming appointments, etc.  Non-urgent messages can be sent to your provider as well.   To learn more about what you can do with MyChart, go to [email or web address].    Your next appointment:   1 month(s)  The format for your next appointment:   In Person  Provider:   You may see Nelva Bush, MD or one of the following Advanced Practice Providers on your designated Care Team:   Murray Hodgkins, NP Christell Faith, PA-C Marrianne Mood, PA-C Cadence Sibley, [place] Laurann Montana, NP

## 2020-09-12 NOTE — Progress Notes (Signed)
Follow-up Outpatient Visit Date: 09/12/2020  Primary Care Provider: Einar Pheasant, [employer] [address] [id number]  Chief Complaint: Follow-up atrial fibrillation  HPI:  Erin [name] is a 87 y.o. female with history of persistent atrial fibrillation, hypertension, hyperlipidemia, and breast cancer, who presents for follow-up of atrial fibrillation.  I last saw her in the office in late April, at which time she remained in atrial fibrillation and complained of more fatigue than usual.  She underwent unsuccessful cardioversion in mid May with failure to convert to sinus rhythm.  She was subsequently loaded with amiodarone and underwent repeat cardioversion last week with restoration of sinus rhythm.  Due to bradycardia post cardioversion, metoprolol was held.  Erin [name] reached out to our office a few days later noting elevated blood pressure and heart rates, prompting [place] to restart metoprolol succinate 25 mg daily.  Today, Erin [name] reports that she feels relatively well though she still feels less energetic than her baseline and a "little funny" at times.  She feels hung over in the mornings.  She previously was able to walk for 40 minutes without difficulty but now can only do about 20 minutes before she feels like she needs to rest due to generalized fatigue and mild exertional dyspnea.  She has also had occasional ill-defined chest pain; she has a hard time saying if it is within her chest or her left breast at the site of her recent surgery.  She has not had any palpitations.  She remains compliant with her medications including amiodarone and apixaban.  She notes occasional swelling of the left ankle and is using her as needed furosemide on average every other day.  --------------------------------------------------------------------------------------------------  Cardiovascular History & Procedures: Cardiovascular Problems: Persistent atrial  fibrillation Atypical chest pain   Risk Factors: Hypertension, hyperlipidemia, and age greater than 27   Cath/PCI: None   CV Surgery: None   EP Procedures and Devices: DCCV (09/03/2020) DCCV (07/30/2020): Unsuccessful - amiodarone load started Event monitor (10/07/16): Predominantly sinus rhythm with frequent PACs and occasional PVCs. Single episode of NSVT. Multiple atrial runs lasting up to 19.4 seconds. No sustained arrhythmias. Event monitor (07/07/16): Predominantly sinus rhythm with 20 episodes of narrow complex tachycardia and irregularity consistent with atrial fibrillation (longest episode noted approximately 12 seconds). Frequent PACs and rare PVCs were noted.   Non-Invasive Evaluation(s): TTE (07/04/2020): Normal LV size and wall thickness.  LVEF 60-65% with normal wall motion.  Normal RV size and function.  Moderate left atrial enlargement.  No significant valvular abnormality. TTE (08/06/16): Normal LV size with LVEF of 55-65%. Normal wall motion with grade 1 diastolic dysfunction. Mild left atrial enlargement. Normal RV size and function. Exercise MPI (07/13/16): Low risk study without ischemia or scar. LVEF greater than 65%. Decreased exercise capacity, exercising 4 minutes, zero seconds, achieving 117 bpm (90% MPHR; 4.6 METs).  Recent CV Pertinent Labs: Lab Results  Component Value Date   CHOL 158 07/26/2020   HDL 79 07/26/2020   LDLCALC 67 07/26/2020   TRIG 61 07/26/2020   CHOLHDL 2.0 07/26/2020   K 4.1 08/27/2020   K 3.5 12/21/2011   MG 2.0 06/07/2020   BUN 18 08/27/2020   CREATININE 1.04 (H) 08/27/2020   CREATININE 0.92 (H) 06/07/2020    Past medical and surgical history were reviewed and updated in EPIC.  Current Meds  Medication Sig   acetaminophen (TYLENOL) 325 MG tablet Take 650 mg by mouth every 6 (six) hours as needed for moderate pain  or headache.   amiodarone (PACERONE) 200 MG tablet Take 1 tablet (200 mg total) by mouth daily.   apixaban (ELIQUIS) 2.5  MG TABS tablet Take 1 tablet (2.5 mg total) by mouth 2 (two) times daily.   benazepril (LOTENSIN) 40 MG tablet Take 1 tablet (40 mg total) by mouth daily.   Calcium Carbonate-Vitamin D 600-400 MG-UNIT tablet Take 1 tablet by mouth in the morning.   felodipine (PLENDIL) 2.5 MG 24 hr tablet Take 1 tablet (2.5 mg total) by mouth daily.   furosemide (LASIX) 20 MG tablet Take 1 tablet (20 mg total) by mouth daily as needed.   metoprolol succinate (TOPROL XL) 25 MG 24 hr tablet Take 1 tablet (25 mg total) by mouth daily.   Multiple Vitamin (MULTIVITAMIN WITH MINERALS) TABS tablet Take 1 tablet by mouth in the morning.   NON FORMULARY Apply 1 application topically daily. Rejuva skin cream   Omega-3 Fatty Acids (FISH OIL) 1000 MG CAPS Take 1,000 mg by mouth every evening.   rosuvastatin (CRESTOR) 5 MG tablet Take 1 tablet (5 mg total) by mouth daily.    Allergies: Erythromycin and Minocin [minocycline hcl]  Social History   Tobacco Use   Smoking status: Former    Pack years: 0.00   Smokeless tobacco: Never  Vaping Use   Vaping Use: Never used  Substance Use Topics   Alcohol use: Yes    Alcohol/week: 0.0 standard drinks    Comment: occassionally   Drug use: No    Family History  Problem Relation Age of Onset   Breast cancer Maternal Aunt    Hypertension Mother    Hypertension Sister    Heart disease Brother        S/P CABG   Multiple sclerosis Sister        died 76    Review of Systems: A 12-system review of systems was performed and was negative except as noted in the HPI.  --------------------------------------------------------------------------------------------------  Physical Exam: BP (!) 142/60 (BP Location: Left Arm, Patient Position: Sitting, Cuff Size: Normal)   Pulse (!) 52   Ht 5' (1.524 m)   Wt 130 lb (59 kg)   SpO2 96%   BMI 25.39 kg/m  Ambulatory heart rate increased to 79 bpm.  General:  NAD. Neck: No JVD or HJR. Lungs: Clear to auscultation  bilaterally without wheezes or crackles. Heart: R bradycardic but regular without murmurs, rubs, or gallops. Abdomen: Soft, nontender, nondistended. Extremities: No lower extremity edema.  EKG: Sinus bradycardia with PACs.  Otherwise, no significant abnormality.  Lab Results  Component Value Date   WBC 5.2 08/27/2020   HGB 13.9 08/27/2020   HCT 40.9 08/27/2020   MCV 95.8 08/27/2020   PLT 181 08/27/2020    Lab Results  Component Value Date   NA 135 08/27/2020   K 4.1 08/27/2020   CL 102 08/27/2020   CO2 29 08/27/2020   BUN 18 08/27/2020   CREATININE 1.04 (H) 08/27/2020   GLUCOSE 130 (H) 08/27/2020   ALT 22 08/06/2020    Lab Results  Component Value Date   CHOL 158 07/26/2020   HDL 79 07/26/2020   LDLCALC 67 07/26/2020   TRIG 61 07/26/2020   CHOLHDL 2.0 07/26/2020    --------------------------------------------------------------------------------------------------  ASSESSMENT AND PLAN: Persistent atrial fibrillation: Erin [name] is maintaining sinus rhythm today, albeit on the somewhat bradycardic side.  She had one episode of elevated blood pressure and heart rates following her cardioversion last week, which prompted [place] to restart low-dose  metoprolol.  We have discussed de-escalation of metoprolol and/or amiodarone as well as EP consultation.  However, Erin [name] wishes to defer this and monitor her symptoms over the next few weeks as she continues to recover from her breast cancer and two cardioversions.  We will continue current doses of amiodarone, metoprolol, and apixaban.  We will have a low threshold for referring her to EP if she does not start to feel better.  Chest pain: Erin [name] notes occasional vague chest discomfort.  EKG is without ischemic changes today.  Last stress test was in 2018 and without ischemia or scar.  We discussed repeating ischemia evaluation but have agreed to defer this.  Hypertension: Blood pressure mildly elevated today and somewhat labile  at home.  Erin [name] should continue to monitor this at home.  We will not adjust her current regimen.  Follow-up: Return to clinic in 1 month.  Nelva Bush, MD 09/13/2020 7:48 AM

## 2020-09-13 ENCOUNTER — Encounter: Payer: Self-pay | Admitting: Internal Medicine

## 2020-09-13 ENCOUNTER — Other Ambulatory Visit: Payer: Self-pay

## 2020-09-13 ENCOUNTER — Other Ambulatory Visit (INDEPENDENT_AMBULATORY_CARE_PROVIDER_SITE_OTHER): Payer: PPO

## 2020-09-13 DIAGNOSIS — I10 Essential (primary) hypertension: Secondary | ICD-10-CM | POA: Diagnosis not present

## 2020-09-13 DIAGNOSIS — R739 Hyperglycemia, unspecified: Secondary | ICD-10-CM | POA: Diagnosis not present

## 2020-09-13 DIAGNOSIS — E785 Hyperlipidemia, unspecified: Secondary | ICD-10-CM

## 2020-09-13 LAB — BASIC METABOLIC PANEL
BUN: 13 mg/dL (ref 6–23)
CO2: 27 mEq/L (ref 19–32)
Calcium: 9.9 mg/dL (ref 8.4–10.5)
Chloride: 103 mEq/L (ref 96–112)
Creatinine, Ser: 0.95 mg/dL (ref 0.40–1.20)
GFR: 53.5 mL/min — ABNORMAL LOW (ref >60.00)
Glucose, Bld: 91 mg/dL (ref 70–99)
Potassium: 4 mEq/L (ref 3.5–5.1)
Sodium: 139 mEq/L (ref 135–145)

## 2020-09-13 LAB — HEPATIC FUNCTION PANEL
ALT: 25 U/L (ref 0–35)
AST: 23 U/L (ref 0–37)
Albumin: 4.4 g/dL (ref 3.5–5.2)
Alkaline Phosphatase: 65 U/L (ref 39–117)
Bilirubin, Direct: 0.2 mg/dL (ref 0.0–0.3)
Total Bilirubin: 0.8 mg/dL (ref 0.2–1.2)
Total Protein: 6.8 g/dL (ref 6.0–8.3)

## 2020-09-13 LAB — LIPID PANEL
Cholesterol: 170 mg/dL (ref 0–200)
HDL: 82.4 mg/dL (ref >39.00)
LDL Cholesterol: 72 mg/dL (ref 0–99)
NonHDL: 87.41
Total CHOL/HDL Ratio: 2
Triglycerides: 78 mg/dL (ref 0.0–149.0)
VLDL: 15.6 mg/dL (ref 0.0–40.0)

## 2020-09-13 LAB — HEMOGLOBIN A1C: Hgb A1c MFr Bld: 5.8 % (ref 4.6–6.5)

## 2020-09-17 ENCOUNTER — Encounter: Payer: Self-pay | Admitting: Internal Medicine

## 2020-09-17 ENCOUNTER — Other Ambulatory Visit: Payer: Self-pay

## 2020-09-17 ENCOUNTER — Ambulatory Visit (INDEPENDENT_AMBULATORY_CARE_PROVIDER_SITE_OTHER): Payer: PPO | Admitting: Internal Medicine

## 2020-09-17 DIAGNOSIS — F439 Reaction to severe stress, unspecified: Secondary | ICD-10-CM | POA: Diagnosis not present

## 2020-09-17 DIAGNOSIS — I10 Essential (primary) hypertension: Secondary | ICD-10-CM | POA: Diagnosis not present

## 2020-09-17 DIAGNOSIS — R739 Hyperglycemia, unspecified: Secondary | ICD-10-CM | POA: Diagnosis not present

## 2020-09-17 DIAGNOSIS — I4819 Other persistent atrial fibrillation: Secondary | ICD-10-CM

## 2020-09-17 DIAGNOSIS — E785 Hyperlipidemia, unspecified: Secondary | ICD-10-CM

## 2020-09-17 DIAGNOSIS — D0592 Unspecified type of carcinoma in situ of left breast: Secondary | ICD-10-CM | POA: Diagnosis not present

## 2020-09-17 NOTE — Patient Instructions (Signed)
-  Decrease metoprolol to 1/2 tablet per day

## 2020-09-17 NOTE — Progress Notes (Signed)
Patient ID: Erin [name], female   DOB: 09/19/1930, 87 y.o.   MRN: [id number]   Subjective:    Patient ID: Erin [name], female    DOB: 08/27/1930, 87 y.o.   MRN: [id number]  HPI This visit occurred during the SARS-CoV-2 public health emergency.  Safety protocols were in place, including screening questions prior to the visit, additional usage of staff PPE, and extensive cleaning of exam room while observing appropriate contact time as indicated for disinfecting solutions.   Patient here for a scheduled follow up.  Recently diagnosed with breast cancer.  S/p left breast lumpectomy 06/24/20.  S/p XRT.  Also recently found to have afib.  Is s/p unsuccessful cardioversion in May with failure to convert to sinus rhythm.  She was subsequently loaded with amiodarone and underwent repeat cardioversion in June.  Restoration of sinus rhythm.  Due to bradycardia post cardioversion metoprolol was initially held.  She had an episode of elevated blood pressure and elevated heart rate.  She was subsequently restarted back on metoprolol 25 mg.  She reports some increased fatigue.  She is still walking.  May only walk 20 to 25 minutes an hour previously was walking 40 minutes.  Breathing overall stable.  No increased chest pain reported.  No increased cough or congestion.  She brings in blood pressure and pulse readings from home.  Most blood pressures averaging 10 6-1 17 over 60s to 70s.  Pulse rate in the 40s and 50s mostly.  She states she has noticed some occasionally in the upper 30s.  She is currently taking 25 mg of metoprolol daily.  When she came off she had the episode of increased heart rate and blood pressure.  We discussed decreasing to 1/2 tablet.  She was agreeable.  We will monitor her blood pressure and pulse.  Notify me or cardiology if any problems.  Overall she feels she is handling stress relatively well.  Has good support.  Does not feel she needs any further intervention.  Eating.  No nausea or vomiting.   No abdominal pain or cramping.  Bowel stable.  Seeing Dr. Rogue Bussing.  Holding on antiestrogen therapy until above issues sorted through.  Past Medical History:  Diagnosis Date   A-fib [facility])    Breast cancer ([place])    Cancer ([place])    melanoma - arm left   Chest pain 07/13/2016   History of colon polyps    Hypercholesterolemia    Hypertension    Past Surgical History:  Procedure Laterality Date   APPENDECTOMY  1946   BREAST BIOPSY Left 06/04/2020   [place] bx, vision marker, path pending   BREAST LUMPECTOMY WITH SENTINEL LYMPH NODE BIOPSY Left 06/24/2020   Procedure: BREAST LUMPECTOMY WITH SENTINEL LYMPH NODE BIOPSY;  Surgeon: Robert Bellow, MD;  Location: [facility];  Service: General;  Laterality: Left;   CARDIOVERSION N/A 07/30/2020   Procedure: CARDIOVERSION;  Surgeon: Nelva Bush, MD;  Location: [facility];  Service: Cardiovascular;  Laterality: N/A;   CARDIOVERSION N/A 09/03/2020   Procedure: CARDIOVERSION;  Surgeon: Nelva Bush, MD;  Location: [facility];  Service: Cardiovascular;  Laterality: N/A;   CATARACT EXTRACTION Bilateral    DILATION AND CURETTAGE OF UTERUS     LAMINECTOMY  2000   release of foraminal stenosis  2003   Family History  Problem Relation Age of Onset   Breast cancer Maternal Aunt    Hypertension Mother    Hypertension Sister    Heart disease Brother  S/P CABG   Multiple sclerosis Sister        died 34   Social History   Socioeconomic History   Marital status: Widowed    Spouse name: Not on file   Number of children: 4   Years of education: Not on file   Highest education level: Not on file  Occupational History   Occupation: retired  Tobacco Use   Smoking status: Former    Pack years: 0.00   Smokeless tobacco: Never  Vaping Use   Vaping Use: Never used  Substance and Sexual Activity   Alcohol use: Yes    Alcohol/week: 0.0 standard drinks    Comment: occassionally   Drug use: No   Sexual activity: Never  Other Topics  Concern   Not on file  Social History Narrative   Not on file   Social Determinants of Health   Financial Resource Strain: Low Risk    Difficulty of Paying Living Expenses: Not hard at all  Food Insecurity: No Food Insecurity   Worried About Charity fundraiser in the Last Year: Never true   [address] in the Last Year: Never true  Transportation Needs: No Transportation Needs   Lack of Transportation (Medical): No   Lack of Transportation (Non-Medical): No  Physical Activity: Sufficiently Active   Days of Exercise per Week: 5 days   Minutes of Exercise per Session: 30 min  Stress: No Stress Concern Present   Feeling of Stress : Not at all  Social Connections: Unknown   Frequency of Communication with Friends and Family: More than three times a week   Frequency of Social Gatherings with Friends and Family: Twice a week   Attends Religious Services: More than 4 times per year   Active Member of [employer] or Organizations: Yes   Attends Archivist Meetings: Not on file   Marital Status: Not on file    Review of Systems  Constitutional:  Positive for fatigue. Negative for appetite change and unexpected weight change.  HENT:  Negative for congestion and sinus pressure.   Respiratory:  Negative for cough, chest tightness and shortness of breath.   Cardiovascular:  Negative for chest pain, palpitations and leg swelling.  Gastrointestinal:  Negative for abdominal pain, diarrhea, nausea and vomiting.  Genitourinary:  Negative for difficulty urinating and dysuria.  Musculoskeletal:  Negative for joint swelling and myalgias.  Skin:  Negative for color change and rash.  Neurological:  Negative for dizziness, light-headedness and headaches.  Psychiatric/Behavioral:  Negative for agitation and dysphoric mood.       Objective:    Physical Exam Vitals reviewed.  Constitutional:      General: She is not in acute distress.    Appearance: Normal appearance.  HENT:     Head:  Normocephalic and atraumatic.     Right Ear: External ear normal.     Left Ear: External ear normal.  Eyes:     General: No scleral icterus.       Right eye: No discharge.        Left eye: No discharge.     Conjunctiva/sclera: Conjunctivae normal.  Neck:     Thyroid: No thyromegaly.  Cardiovascular:     Rate and Rhythm: Normal rate and regular rhythm.  Pulmonary:     Effort: No respiratory distress.     Breath sounds: Normal breath sounds. No wheezing.  Abdominal:     General: Bowel sounds are normal.     Palpations:  Abdomen is soft.     Tenderness: There is no abdominal tenderness.  Musculoskeletal:        General: No swelling or tenderness.     Cervical back: Neck supple. No tenderness.  Lymphadenopathy:     Cervical: No cervical adenopathy.  Skin:    Findings: No erythema or rash.  Neurological:     Mental Status: She is alert.  Psychiatric:        Mood and Affect: Mood normal.        Behavior: Behavior normal.    BP 126/72   Pulse (!) 51   Temp 98.4 F (36.9 C)   Ht 5' (1.524 m)   Wt 129 lb (58.5 kg)   SpO2 96%   BMI 25.19 kg/m  Wt Readings from Last 3 Encounters:  09/17/20 129 lb (58.5 kg)  09/12/20 130 lb (59 kg)  09/09/20 130 lb (59 kg)    Outpatient Encounter Medications as of 09/17/2020  Medication Sig   acetaminophen (TYLENOL) 325 MG tablet Take 650 mg by mouth every 6 (six) hours as needed for moderate pain or headache.   amiodarone (PACERONE) 200 MG tablet Take 1 tablet (200 mg total) by mouth daily.   apixaban (ELIQUIS) 2.5 MG TABS tablet Take 1 tablet (2.5 mg total) by mouth 2 (two) times daily.   benazepril (LOTENSIN) 40 MG tablet Take 1 tablet (40 mg total) by mouth daily.   Calcium Carbonate-Vitamin D 600-400 MG-UNIT tablet Take 1 tablet by mouth in the morning.   felodipine (PLENDIL) 2.5 MG 24 hr tablet Take 1 tablet (2.5 mg total) by mouth daily.   furosemide (LASIX) 20 MG tablet Take 1 tablet (20 mg total) by mouth daily as needed.    metoprolol succinate (TOPROL XL) 25 MG 24 hr tablet Take 1 tablet (25 mg total) by mouth daily.   Multiple Vitamin (MULTIVITAMIN WITH MINERALS) TABS tablet Take 1 tablet by mouth in the morning.   Omega-3 Fatty Acids (FISH OIL) 1000 MG CAPS Take 1,000 mg by mouth every evening.   rosuvastatin (CRESTOR) 5 MG tablet Take 1 tablet (5 mg total) by mouth daily.   [DISCONTINUED] NON FORMULARY Apply 1 application topically daily. Rejuva skin cream (Patient not taking: Reported on 09/17/2020)   No facility-administered encounter medications on file as of 09/17/2020.     Lab Results  Component Value Date   WBC 5.2 08/27/2020   HGB 13.9 08/27/2020   HCT 40.9 08/27/2020   PLT 181 08/27/2020   GLUCOSE 91 09/13/2020   CHOL 170 09/13/2020   TRIG 78.0 09/13/2020   HDL 82.40 09/13/2020   LDLCALC 72 09/13/2020   ALT 25 09/13/2020   AST 23 09/13/2020   NA 139 09/13/2020   K 4.0 09/13/2020   CL 103 09/13/2020   CREATININE 0.95 09/13/2020   BUN 13 09/13/2020   CO2 27 09/13/2020   TSH 0.764 07/26/2020   HGBA1C 5.8 09/13/2020       Assessment & Plan:   Problem List Items Addressed This Visit     Atrial fibrillation ([place])    Status post cardioversion as outlined.  Appears to be in sinus rhythm today.  On Eliquis.  Decrease metoprolol to one half 25 mg tablet.  Follow.        Carcinoma in situ of breast    Recently diagnosed with breast cancer.  Status post breast lumpectomy for 2022.  Followed by Dr. Bary Castilla.  Also seeing Dr. Rogue Bussing.  Completed radiation therapy.  Planning for follow-up  bone density.  Further discussion once cardiac issues are sorted through regarding antiestrogen therapy.       Essential hypertension    Reviewed outside blood pressure checks as outlined.  We will decrease metoprolol to one half 25 mg tablet.  Continue benazepril.  Follow pressures.  Follow metabolic panel.       Hyperglycemia    Low carb diet and exercise.  Follow met b and a1c.         Hyperlipidemia    Continue crestor.  Low cholesterol diet and exercise.  Follow lipid panel and liver function tests.        Stress    Increased stress recently as outlined.  Overall she appears to be handling things relatively well.  Does not feel she needs any further intervention.  Has good support.         Einar Pheasant, MD

## 2020-09-19 DIAGNOSIS — M8580 Other specified disorders of bone density and structure, unspecified site: Secondary | ICD-10-CM | POA: Diagnosis not present

## 2020-09-21 ENCOUNTER — Encounter: Payer: Self-pay | Admitting: Internal Medicine

## 2020-09-21 NOTE — Assessment & Plan Note (Signed)
Reviewed outside blood pressure checks as outlined.  We will decrease metoprolol to one half 25 mg tablet.  Continue benazepril.  Follow pressures.  Follow metabolic panel.

## 2020-09-21 NOTE — Assessment & Plan Note (Signed)
Recently diagnosed with breast cancer.  Status post breast lumpectomy for 2022.  Followed by Dr. Bary Castilla.  Also seeing Dr. Rogue Bussing.  Completed radiation therapy.  Planning for follow-up bone density.  Further discussion once cardiac issues are sorted through regarding antiestrogen therapy.

## 2020-09-21 NOTE — Assessment & Plan Note (Signed)
Continue crestor.  Low cholesterol diet and exercise. Follow lipid panel and liver function tests.   

## 2020-09-21 NOTE — Assessment & Plan Note (Addendum)
Status post cardioversion as outlined.  Appears to be in sinus rhythm today.  On Eliquis.  Decrease metoprolol to one half 25 mg tablet.  Follow.

## 2020-09-21 NOTE — Assessment & Plan Note (Signed)
Low carb diet and exercise.  Follow met b and a1c.  

## 2020-09-21 NOTE — Assessment & Plan Note (Signed)
Increased stress recently as outlined.  Overall she appears to be handling things relatively well.  Does not feel she needs any further intervention.  Has good support.

## 2020-09-30 DIAGNOSIS — M8588 Other specified disorders of bone density and structure, other site: Secondary | ICD-10-CM | POA: Diagnosis not present

## 2020-10-02 ENCOUNTER — Ambulatory Visit: Payer: PPO | Admitting: Radiation Oncology

## 2020-10-03 ENCOUNTER — Encounter: Payer: Self-pay | Admitting: Radiation Oncology

## 2020-10-03 ENCOUNTER — Ambulatory Visit
Admission: RE | Admit: 2020-10-03 | Discharge: 2020-10-03 | Disposition: A | Discharge status: Home / Self Care | Payer: PPO | Source: Ambulatory Visit | Attending: Radiation Oncology | Admitting: Radiation Oncology

## 2020-10-03 ENCOUNTER — Other Ambulatory Visit: Payer: Self-pay

## 2020-10-03 VITALS — BP 157/65 | HR 55 | Temp 97.0°F | Wt 129.9 lb

## 2020-10-03 DIAGNOSIS — C50412 Malignant neoplasm of upper-outer quadrant of left female breast: Secondary | ICD-10-CM

## 2020-10-03 DIAGNOSIS — Z170 Estrogen receptor positive status [ER+]: Secondary | ICD-10-CM

## 2020-10-03 NOTE — Progress Notes (Signed)
Radiation Oncology Follow up Note  Name: Erin [name]   Date:   10/03/2020 MRN:  [id number] DOB: 11-27-1930    This 87 y.o. female presents to the clinic today for 1 month follow-up status post whole breast radiation to her left breast for ductal carcinoma in situ ER positive.  REFERRING PROVIDER: Einar Pheasant, MD  HPI: Patient is a 87 year old female now at 1 month having completed whole breast radiation to her left breast for ER positive ductal carcinoma in situ.  Seen today in routine follow-up she is doing well.  She specifically denies breast tenderness cough or bone pain.  She has been offered antiestrogen therapy although declined..  COMPLICATIONS OF TREATMENT: none  FOLLOW UP COMPLIANCE: keeps appointments   PHYSICAL EXAM:  BP (!) 157/65   Pulse (!) 55   Temp (!) 97 F (36.1 C) (Tympanic)   Wt 129 lb 14.4 oz (58.9 kg)   BMI 25.37 kg/m  Lungs are clear to A&P cardiac examination essentially unremarkable with regular rate and rhythm. No dominant mass or nodularity is noted in either breast in 2 positions examined. Incision is well-healed. No axillary or supraclavicular adenopathy is appreciated. Cosmetic result is excellent.  Well-developed well-nourished patient in NAD. HEENT reveals PERLA, EOMI, discs not visualized.  Oral cavity is clear. No oral mucosal lesions are identified. Neck is clear without evidence of cervical or supraclavicular adenopathy. Lungs are clear to A&P. Cardiac examination is essentially unremarkable with regular rate and rhythm without murmur rub or thrill. Abdomen is benign with no organomegaly or masses noted. Motor sensory and DTR levels are equal and symmetric in the upper and lower extremities. Cranial nerves II through XII are grossly intact. Proprioception is intact. No peripheral adenopathy or edema is identified. No motor or sensory levels are noted. Crude visual fields are within normal range.  RADIOLOGY RESULTS: No current films to  review  PLAN: Present time patient is doing well with no evidence of disease 1 month out.  She tolerated treatments well.  She is declined antiestrogen therapy.  I have asked to see her back in 4 to 5 months for follow-up.  Patient knows to call with any concerns.  I would like to take this opportunity to thank you for allowing me to participate in the care of your patient.Noreene Filbert, MD

## 2020-10-04 DIAGNOSIS — L565 Disseminated superficial actinic porokeratosis (DSAP): Secondary | ICD-10-CM | POA: Diagnosis not present

## 2020-10-16 NOTE — Progress Notes (Signed)
Cardiology Office Note:    Date:  10/17/2020   ID:  Erin [name], DOB 1930/12/04, MRN [id number]  PCP:  Einar Pheasant, MD  [facility] Cardiologist:  Nelva Bush, MD  [facility] Electrophysiologist:  None   Referring MD: Einar Pheasant, MD   Chief Complaint: 1 month follow-up  History of Present Illness:    Erin [name] is a 87 y.o. female with a hx of persistent atrial fibrillation, HTN, HLD, and breast cancer who presents for 1 month follow-up.   Patient underwent unsuccesful cardioversion in May 2022 with failure to convert to NSR. She was subsequently loaded with amiodarone and underwent repeat cardioversion 09/03/20 with restoration to NSR. Due to bradycardia post-cardioversion metoprolol was held. At follow-up she had elevated Bps and heart rates and metoprolol was subsequently restarted.   Last seen 09/12/20 and was feeling generally well, though less energy. Also reported vague chest pain. Taking lasix every other day for ankle swelling. De-escalation of metoprolol/amiodarone vs EP referral, but patient wanted to defer and continue to recover from her recent breast cancer.   Today, the patient reports she is now taking metoprolol 12.'5mg'$  daily for bradycardia, this was decreased by PCP on 7/5 . EKG today shows heart rate 50bpm, which is normal for her. She denies lightheadedness or dizziness. BP log shows 100-120/50-60s, HR 50s. Patient reports she has been doing well, still doesn't have a lot of energy, feels this is due to the weather. She is walking outside in the morning walking 1-1.5 miles daily. She feels good doing this. She is taking the lasix 4 times a week, based off her weight and lower leg edema. She is tolerating Crestor well. Has chest twinges, this started after her breast surgery in. Left sided twinges. Not a sharp pain, not worse with exertion. Seems to improve with position changes.   Past Medical History:  Diagnosis Date   A-fib [facility])    Breast cancer  ([place])    Cancer ([place])    melanoma - arm left   Chest pain 07/13/2016   History of colon polyps    Hypercholesterolemia    Hypertension     Past Surgical History:  Procedure Laterality Date   APPENDECTOMY  1946   BREAST BIOPSY Left 06/04/2020   [place] bx, vision marker, path pending   BREAST LUMPECTOMY WITH SENTINEL LYMPH NODE BIOPSY Left 06/24/2020   Procedure: BREAST LUMPECTOMY WITH SENTINEL LYMPH NODE BIOPSY;  Surgeon: Robert Bellow, MD;  Location: [facility];  Service: General;  Laterality: Left;   CARDIOVERSION N/A 07/30/2020   Procedure: CARDIOVERSION;  Surgeon: Nelva Bush, MD;  Location: [facility];  Service: Cardiovascular;  Laterality: N/A;   CARDIOVERSION N/A 09/03/2020   Procedure: CARDIOVERSION;  Surgeon: Nelva Bush, MD;  Location: [facility];  Service: Cardiovascular;  Laterality: N/A;   CATARACT EXTRACTION Bilateral    DILATION AND CURETTAGE OF UTERUS     LAMINECTOMY  2000   release of foraminal stenosis  2003    Current Medications: Current Meds  Medication Sig   acetaminophen (TYLENOL) 325 MG tablet Take 650 mg by mouth every 6 (six) hours as needed for moderate pain or headache.   amiodarone (PACERONE) 200 MG tablet Take 1 tablet (200 mg total) by mouth daily.   apixaban (ELIQUIS) 2.5 MG TABS tablet Take 1 tablet (2.5 mg total) by mouth 2 (two) times daily.   benazepril (LOTENSIN) 40 MG tablet Take 1 tablet (40 mg total) by mouth daily.   Calcium Carbonate-Vitamin D 600-400  MG-UNIT tablet Take 1 tablet by mouth in the morning.   felodipine (PLENDIL) 2.5 MG 24 hr tablet Take 1 tablet (2.5 mg total) by mouth daily.   furosemide (LASIX) 20 MG tablet Take 1 tablet (20 mg total) by mouth daily as needed.   Multiple Vitamin (MULTIVITAMIN WITH MINERALS) TABS tablet Take 1 tablet by mouth in the morning.   Omega-3 Fatty Acids (FISH OIL) 1000 MG CAPS Take 1,000 mg by mouth every evening.   rosuvastatin (CRESTOR) 5 MG tablet Take 1 tablet (5 mg total) by mouth daily.    [DISCONTINUED] metoprolol succinate (TOPROL XL) 25 MG 24 hr tablet Take 1 tablet (25 mg total) by mouth daily. (Patient taking differently: Take 12.5 mg by mouth daily.)     Allergies:   Erythromycin and Minocin [minocycline hcl]   Social History   Socioeconomic History   Marital status: Widowed    Spouse name: Not on file   Number of children: 4   Years of education: Not on file   Highest education level: Not on file  Occupational History   Occupation: retired  Tobacco Use   Smoking status: Former   Smokeless tobacco: Never  Scientific laboratory technician Use: Never used  Substance and Sexual Activity   Alcohol use: Yes    Alcohol/week: 0.0 standard drinks    Comment: occassionally   Drug use: No   Sexual activity: Never  Other Topics Concern   Not on file  Social History Narrative   Not on file   Social Determinants of Health   Financial Resource Strain: Low Risk    Difficulty of Paying Living Expenses: Not hard at all  Food Insecurity: No Food Insecurity   Worried About Charity fundraiser in the Last Year: Never true   [place] in the Last Year: Never true  Transportation Needs: No Transportation Needs   Lack of Transportation (Medical): No   Lack of Transportation (Non-Medical): No  Physical Activity: Sufficiently Active   Days of Exercise per Week: 5 days   Minutes of Exercise per Session: 30 min  Stress: No Stress Concern Present   Feeling of Stress : Not at all  Social Connections: Unknown   Frequency of Communication with Friends and Family: More than three times a week   Frequency of Social Gatherings with Friends and Family: Twice a week   Attends Religious Services: More than 4 times per year   Active Member of [employer] or Organizations: Yes   Attends Archivist Meetings: Not on file   Marital Status: Not on file     Family History: The patient's family history includes Breast cancer in her maternal aunt; Heart disease in her brother;  Hypertension in her mother and sister; Multiple sclerosis in her sister.  ROS:   Please see the history of present illness.     All other systems reviewed and are negative.  EKGs/Labs/Other Studies Reviewed:    The following studies were reviewed today:  Echo 07/04/20   1. Left ventricular ejection fraction, by estimation, is 60 to 65%. The  left ventricle has normal function. The left ventricle has no regional  wall motion abnormalities. Left ventricular diastolic parameters are  indeterminate.   2. Right ventricular systolic function is normal. The right ventricular  size is normal.   3. Left atrial size was moderately dilated.   Lexiscan myoview 06/2016 Blood pressure demonstrated a normal response to exercise. There was no ST segment deviation noted during  stress. No T wave inversion was noted during stress. The study is normal. This is a low risk study. The left ventricular ejection fraction is hyperdynamic (>65%).   EKG:  EKG is ordered today.  The ekg ordered today demonstrates SB, 50bpm, nonspecific T wave changes  Recent Labs: 06/07/2020: Magnesium 2.0 07/26/2020: TSH 0.764 08/27/2020: Hemoglobin 13.9; Platelets 181 09/13/2020: ALT 25; BUN 13; Creatinine, Ser 0.95; Potassium 4.0; Sodium 139  Recent Lipid Panel    Component Value Date/Time   CHOL 170 09/13/2020 0755   TRIG 78.0 09/13/2020 0755   HDL 82.40 09/13/2020 0755   CHOLHDL 2 09/13/2020 0755   VLDL 15.6 09/13/2020 0755   LDLCALC 72 09/13/2020 0755     Physical Exam:    VS:  BP 140/70 (BP Location: Left Arm, Patient Position: Sitting, Cuff Size: Normal)   Pulse (!) 50   Ht 5' (1.524 m)   Wt 130 lb (59 kg)   SpO2 98%   BMI 25.39 kg/m     Wt Readings from Last 3 Encounters:  10/17/20 130 lb (59 kg)  10/03/20 129 lb 14.4 oz (58.9 kg)  09/17/20 129 lb (58.5 kg)     GEN:  Well nourished, well developed in no acute distress HEENT: Normal NECK: No JVD; No carotid bruits LYMPHATICS: No  lymphadenopathy CARDIAC: bradycardia, RR, no murmurs, rubs, gallops RESPIRATORY:  Clear to auscultation without rales, wheezing or rhonchi  ABDOMEN: Soft, non-tender, non-distended MUSCULOSKELETAL:  No edema; No deformity  SKIN: Warm and dry NEUROLOGIC:  Alert and oriented x 3 PSYCHIATRIC:  Normal affect   ASSESSMENT:    1. Paroxysmal atrial fibrillation (HCC)   2. Essential hypertension   3. Chest pain, unspecified type    PLAN:    In order of problems listed above:  Paroxysmal Afib She is in SB today with heart rate of 50bpm. PCP decreased Toprol-XL on 7/5 from '25mg'$  to 12.'5mg'$  due to bradycardia with heart rates in the upper 40s. Since then heart rates have been in the 50s. She denies lightheadedness and dizziness. She is also taking amiodarone '200mg'$  daily. Continue Eliquis 2.'5mg'$  mg BID(age and weight).   Chest pain Reports left sided chest twinges that started after breast cancer surgery. Not worse with exertion. EKG with no new ischemic changes. I will order lexiscan Myoview. Continue statin and Toprol. No ASA with Eliquis.   HTN BP log shows BP normotensive 110-120/50-60s. Continue benazepril and Toprol 12.'5mg'$  daily.   Disposition: Follow up in 3 month(s) with Dr. Saunders Revel   Shared Decision Making/Informed Consent   Shared Decision Making/Informed Consent The risks [chest pain, shortness of breath, cardiac arrhythmias, dizziness, blood pressure fluctuations, myocardial infarction, stroke/transient ischemic attack, nausea, vomiting, allergic reaction, radiation exposure, metallic taste sensation and life-threatening complications (estimated to be 1 in 10,000)], benefits (risk stratification, diagnosing coronary artery disease, treatment guidance) and alternatives of a nuclear stress test were discussed in detail with Ms. Harkins and she agrees to proceed.    Signed, Varnika Butz Ninfa Meeker, PA-C  10/17/2020 9:24 AM    [facility]

## 2020-10-17 ENCOUNTER — Ambulatory Visit: Payer: PPO | Admitting: Medical

## 2020-10-17 ENCOUNTER — Other Ambulatory Visit: Payer: Self-pay

## 2020-10-17 ENCOUNTER — Encounter: Payer: Self-pay | Admitting: Medical

## 2020-10-17 VITALS — BP 140/70 | HR 50 | Ht 60.0 in | Wt 130.0 lb

## 2020-10-17 DIAGNOSIS — R079 Chest pain, unspecified: Secondary | ICD-10-CM

## 2020-10-17 DIAGNOSIS — I480 Paroxysmal atrial fibrillation: Secondary | ICD-10-CM | POA: Diagnosis not present

## 2020-10-17 DIAGNOSIS — I10 Essential (primary) hypertension: Secondary | ICD-10-CM | POA: Diagnosis not present

## 2020-10-17 MED ORDER — METOPROLOL SUCCINATE ER 25 MG PO TB24: 12.5000 mg | ORAL_TABLET | Freq: Every day | ORAL | Status: DC

## 2020-10-17 NOTE — Patient Instructions (Signed)
Medication Instructions:  No changes at this time.   *If you need a refill on your cardiac medications before your next appointment, please call your pharmacy*   Lab Work: None  If you have labs (blood work) drawn today and your tests are completely normal, you will receive your results only by: [place] (if you have MyChart) OR A paper copy in the mail If you have any lab test that is abnormal or we need to change your treatment, we will call you to review the results.   Testing/Procedures: [place]  Your caregiver has ordered a Stress Test with nuclear imaging. The purpose of this test is to evaluate the blood supply to your heart muscle. This procedure is referred to as a "Non-Invasive Stress Test." This is because other than having an IV started in your vein, nothing is inserted or "invades" your body. Cardiac stress tests are done to find areas of poor blood flow to the heart by determining the extent of coronary artery disease (CAD). Some patients exercise on a treadmill, which naturally increases the blood flow to your heart, while others who are  unable to walk on a treadmill due to physical limitations have a pharmacologic/chemical stress agent called Lexiscan . This medicine will mimic walking on a treadmill by temporarily increasing your coronary blood flow.   Please note: these test may take anywhere between 2-4 hours to complete  PLEASE REPORT TO [facility] AT THE FIRST DESK WILL DIRECT YOU WHERE TO GO  Date of Procedure:_____________________________________  Arrival Time for Procedure:______________________________  Instructions regarding medication:   __XX__ : Hold Furosemide the morning of procedure   PLEASE NOTIFY THE OFFICE AT LEAST 24 HOURS IN ADVANCE IF YOU ARE UNABLE TO KEEP YOUR APPOINTMENT.  [phone] AND  PLEASE NOTIFY NUCLEAR MEDICINE AT [facility] AT LEAST 24 HOURS IN ADVANCE IF YOU ARE UNABLE TO KEEP YOUR  APPOINTMENT. [phone]  How to prepare for your Myoview test:  Do not eat or drink after midnight No caffeine for 24 hours prior to test No smoking 24 hours prior to test. Your medication may be taken with water.  If your doctor stopped a medication because of this test, do not take that medication. Ladies, please do not wear dresses.  Skirts or pants are appropriate. Please wear a short sleeve shirt. No perfume, cologne or lotion. Wear comfortable walking shoes. No heels!    Follow-Up: At [facility], you and your health needs are our priority.  As part of our continuing mission to provide you with exceptional heart care, we have created designated Provider Care Teams.  These Care Teams include your primary Cardiologist (physician) and Advanced Practice Providers (APPs -  Physician Assistants and Nurse Practitioners) who all work together to provide you with the care you need, when you need it.   Your next appointment:   3 month(s)  The format for your next appointment:   In Person  Provider:   Nelva Bush, MD

## 2020-10-31 ENCOUNTER — Encounter
Admission: RE | Admit: 2020-10-31 | Discharge: 2020-10-31 | Disposition: A | Discharge status: Home / Self Care | Payer: PPO | Source: Ambulatory Visit | Attending: Medical | Admitting: Medical

## 2020-10-31 ENCOUNTER — Other Ambulatory Visit: Payer: Self-pay

## 2020-10-31 DIAGNOSIS — R079 Chest pain, unspecified: Secondary | ICD-10-CM | POA: Diagnosis not present

## 2020-10-31 MED ORDER — TECHNETIUM TC 99M TETROFOSMIN IV KIT
30.0000 | PACK | Freq: Once | INTRAVENOUS | Status: AC | PRN
  Administered 2020-10-31: 29.62 via INTRAVENOUS

## 2020-10-31 MED ORDER — TECHNETIUM TC 99M TETROFOSMIN IV KIT
10.0000 | PACK | Freq: Once | INTRAVENOUS | Status: AC
  Administered 2020-10-31: 10.1 via INTRAVENOUS

## 2020-10-31 MED ORDER — REGADENOSON 0.4 MG/5ML IV SOLN
0.4000 mg | Freq: Once | INTRAVENOUS | Status: AC
  Administered 2020-10-31: 0.4 mg via INTRAVENOUS

## 2020-11-01 LAB — NM MYOCAR MULTI W/SPECT W/WALL MOTION / EF
Estimated workload: 1 METS
Exercise duration (min): 0 min
Exercise duration (sec): 0 s
LV dias vol: 61 mL (ref 46–106)
LV sys vol: 23 mL
MPHR: 132 {beats}/min
Peak HR: 61 {beats}/min
Percent HR: 46 %
Rest HR: 47 {beats}/min
SDS: 0
SRS: 15
SSS: 0
TID: 1.14

## 2020-11-07 ENCOUNTER — Telehealth: Payer: Self-pay

## 2020-11-07 ENCOUNTER — Telehealth: Payer: Self-pay | Admitting: Internal Medicine

## 2020-11-07 NOTE — Telephone Encounter (Signed)
Patient calling to check on status.

## 2020-11-07 NOTE — Telephone Encounter (Signed)
-----   Message from [place], PA-C sent at 11/07/2020  1:53 PM EDT ----- Stress test showed no significant ischemia (blockages), normal wall motion, EF normal, low risk It shows heart rate was upper 40s, Lets stop her Toprol.

## 2020-11-07 NOTE — Telephone Encounter (Signed)
Reviewed the patient's chart. She had a myoview done on 10/31/20 that was ordered by Tarri Glenn, PA.  There is no final result at this time.  Preliminary finding per Dr. Rockey Situ: Narrative & Impression  Pharmacological myocardial perfusion imaging study with no significant  Ischemia Normal wall motion, EF estimated at 88% No EKG changes concerning for ischemia at peak stress or in recovery. Sinus bradycardia on resting EKG rate 47 up to 53 beats per minute CT attenuation correction images with moderate coronary calcification and aortic atherosclerosis Low risk scan     Signed, Esmond Plants, MD, Ph.D [facility]        Attempted to to call the patient. No answer- I left a detailed message on her voice mail (ok per DPR), that we are waiting on the final results of the test, but did advise of preliminary findings as per Dr. Rockey Situ as above.  I asked that she call back with any further questions/ concerns, but that we will call her once the final results are available.

## 2020-11-07 NOTE — Telephone Encounter (Signed)
Spoke to patient and gave her the result note from [place]. Patient will stop the Toprol XL, and keep a daily record of her pulse. She will let us know if her HR continues to trend on the low side. Patient was curious if she was going to have to stay on amiodarone forever, she started in May after 1st DCCV and has had a successful DCCV in June. She is on 200 mg daily now. I informed her that it most likely depends on if she stays in sinus rhythm and that would be evaluated by Dr. Saunders Revel at her follow up in November.  I informed her that I would forward the question to Dr. Saunders Revel for her, and she was very grateful for the follow up.

## 2020-11-07 NOTE — Telephone Encounter (Signed)
Given failed cardioversion in the past prior to initiation of amiodarone, she will like remain on amiodarone forever unless medication-related concerns/side-effects arise.  Nelva Bush, MD [facility]

## 2020-12-10 DIAGNOSIS — Z961 Presence of intraocular lens: Secondary | ICD-10-CM | POA: Diagnosis not present

## 2020-12-24 ENCOUNTER — Other Ambulatory Visit: Payer: Self-pay

## 2020-12-24 ENCOUNTER — Ambulatory Visit: Payer: PPO | Admitting: Internal Medicine

## 2020-12-24 ENCOUNTER — Encounter: Payer: Self-pay | Admitting: Internal Medicine

## 2020-12-24 ENCOUNTER — Ambulatory Visit (INDEPENDENT_AMBULATORY_CARE_PROVIDER_SITE_OTHER): Payer: PPO | Admitting: Internal Medicine

## 2020-12-24 VITALS — BP 130/70 | HR 60 | Temp 97.9°F | Resp 16 | Ht 60.0 in | Wt 131.0 lb

## 2020-12-24 DIAGNOSIS — E785 Hyperlipidemia, unspecified: Secondary | ICD-10-CM

## 2020-12-24 DIAGNOSIS — I4819 Other persistent atrial fibrillation: Secondary | ICD-10-CM | POA: Diagnosis not present

## 2020-12-24 DIAGNOSIS — F439 Reaction to severe stress, unspecified: Secondary | ICD-10-CM

## 2020-12-24 DIAGNOSIS — I10 Essential (primary) hypertension: Secondary | ICD-10-CM | POA: Diagnosis not present

## 2020-12-24 DIAGNOSIS — D0592 Unspecified type of carcinoma in situ of left breast: Secondary | ICD-10-CM | POA: Diagnosis not present

## 2020-12-24 DIAGNOSIS — R739 Hyperglycemia, unspecified: Secondary | ICD-10-CM

## 2020-12-24 LAB — HEPATIC FUNCTION PANEL
ALT: 22 U/L (ref 0–35)
AST: 22 U/L (ref 0–37)
Albumin: 4.3 g/dL (ref 3.5–5.2)
Alkaline Phosphatase: 69 U/L (ref 39–117)
Bilirubin, Direct: 0.2 mg/dL (ref 0.0–0.3)
Total Bilirubin: 0.7 mg/dL (ref 0.2–1.2)
Total Protein: 6.6 g/dL (ref 6.0–8.3)

## 2020-12-24 LAB — LIPID PANEL
Cholesterol: 183 mg/dL (ref 0–200)
HDL: 94.4 mg/dL (ref >39.00)
LDL Cholesterol: 74 mg/dL (ref 0–99)
NonHDL: 88.9
Total CHOL/HDL Ratio: 2
Triglycerides: 75 mg/dL (ref 0.0–149.0)
VLDL: 15 mg/dL (ref 0.0–40.0)

## 2020-12-24 LAB — BASIC METABOLIC PANEL
BUN: 20 mg/dL (ref 6–23)
CO2: 30 mEq/L (ref 19–32)
Calcium: 9.9 mg/dL (ref 8.4–10.5)
Chloride: 102 mEq/L (ref 96–112)
Creatinine, Ser: 0.98 mg/dL (ref 0.40–1.20)
GFR: 51.44 mL/min — ABNORMAL LOW (ref >60.00)
Glucose, Bld: 99 mg/dL (ref 70–99)
Potassium: 4.3 mEq/L (ref 3.5–5.1)
Sodium: 139 mEq/L (ref 135–145)

## 2020-12-24 LAB — HEMOGLOBIN A1C: Hgb A1c MFr Bld: 5.7 % (ref 4.6–6.5)

## 2020-12-24 NOTE — Assessment & Plan Note (Signed)
S/p cardioversion.  Appears to be in SR today.  Continues on eliquis, amiodarone and metoprolol.

## 2020-12-24 NOTE — Assessment & Plan Note (Signed)
Low carb diet and exercise.  Follow met b and a1c.  

## 2020-12-24 NOTE — Assessment & Plan Note (Signed)
Handling things well.  Follow.   

## 2020-12-24 NOTE — Assessment & Plan Note (Signed)
Reviewed outside blood pressure checks as outlined.  On metoprolol 12.5mg  q day. Continue benazepril.  Follow pressures.  Follow metabolic panel.

## 2020-12-24 NOTE — Assessment & Plan Note (Signed)
Continue crestor.  Low cholesterol diet and exercise. Follow lipid panel and liver function tests.   

## 2020-12-24 NOTE — Assessment & Plan Note (Signed)
Recently diagnosed with breast cancer.  S/p breast lumpectomy 06/24/20.  Has seen Dr Bary Castilla, [place] and Dr Rogue Bussing.

## 2020-12-24 NOTE — Progress Notes (Signed)
Patient ID: Erin [name], female   DOB: Apr 01, 1930, 87 y.o.   MRN: [id number]   Subjective:    Patient ID: Erin [name], female    DOB: 17-Jul-1930, 87 y.o.   MRN: [id number]  This visit occurred during the SARS-CoV-2 public health emergency.  Safety protocols were in place, including screening questions prior to the visit, additional usage of staff PPE, and extensive cleaning of exam room while observing appropriate contact time as indicated for disinfecting solutions.   Patient here for a scheduled follow up.    Chief Complaint  Patient presents with   Hypertension   .   Hypertension Pertinent negatives include no chest pain, headaches or palpitations.  Recently saw cardiology.  Had stress test.  Ok.  She is still walking.  Will still notice some sob, but feels is stable.  No chest pain.  No cough or congestion.  No abdominal pain.  Bowels moving.  Handling stress.  Tolerating her medication.    Past Medical History:  Diagnosis Date   A-fib [facility])    Breast cancer ([place])    Cancer ([place])    melanoma - arm left   Chest pain 07/13/2016   History of colon polyps    Hypercholesterolemia    Hypertension    Past Surgical History:  Procedure Laterality Date   APPENDECTOMY  1946   BREAST BIOPSY Left 06/04/2020   [place] bx, vision marker, path pending   BREAST LUMPECTOMY WITH SENTINEL LYMPH NODE BIOPSY Left 06/24/2020   Procedure: BREAST LUMPECTOMY WITH SENTINEL LYMPH NODE BIOPSY;  Surgeon: Robert Bellow, MD;  Location: [place] [facility];  Service: General;  Laterality: Left;   CARDIOVERSION N/A 07/30/2020   Procedure: CARDIOVERSION;  Surgeon: Nelva Bush, MD;  Location: [facility];  Service: Cardiovascular;  Laterality: N/A;   CARDIOVERSION N/A 09/03/2020   Procedure: CARDIOVERSION;  Surgeon: Nelva Bush, MD;  Location: [facility];  Service: Cardiovascular;  Laterality: N/A;   CATARACT EXTRACTION Bilateral    DILATION AND CURETTAGE OF UTERUS     LAMINECTOMY  2000   release of foraminal  stenosis  2003   Family History  Problem Relation Age of Onset   Breast cancer Maternal Aunt    Hypertension Mother    Hypertension Sister    Heart disease Brother        S/P CABG   Multiple sclerosis Sister        died 79   Social History   Socioeconomic History   Marital status: Widowed    Spouse name: Not on file   Number of children: 4   Years of education: Not on file   Highest education level: Not on file  Occupational History   Occupation: retired  Tobacco Use   Smoking status: Former   Smokeless tobacco: Never  Scientific laboratory technician Use: Never used  Substance and Sexual Activity   Alcohol use: Yes    Alcohol/week: 0.0 standard drinks    Comment: occassionally   Drug use: No   Sexual activity: Never  Other Topics Concern   Not on file  Social History Narrative   Not on file   Social Determinants of Health   Financial Resource Strain: Low Risk    Difficulty of Paying Living Expenses: Not hard at all  Food Insecurity: No Food Insecurity   Worried About Charity fundraiser in the Last Year: Never true   [place] in the Last Year: Never true  Transportation Needs: No Transportation Needs  Lack of Transportation (Medical): No   Lack of Transportation (Non-Medical): No  Physical Activity: Sufficiently Active   Days of Exercise per Week: 5 days   Minutes of Exercise per Session: 30 min  Stress: No Stress Concern Present   Feeling of Stress : Not at all  Social Connections: Unknown   Frequency of Communication with Friends and Family: More than three times a week   Frequency of Social Gatherings with Friends and Family: Twice a week   Attends Religious Services: More than 4 times per year   Active Member of [employer] or Organizations: Yes   Attends Archivist Meetings: Not on file   Marital Status: Not on file     Review of Systems  Constitutional:  Negative for appetite change and unexpected weight change.  HENT:  Negative for congestion  and sinus pressure.   Respiratory:  Negative for cough and chest tightness.        Breathing stable as outlined.   Cardiovascular:  Negative for chest pain, palpitations and leg swelling.  Gastrointestinal:  Negative for abdominal pain, diarrhea, nausea and vomiting.  Genitourinary:  Negative for difficulty urinating and dysuria.  Musculoskeletal:  Negative for joint swelling and myalgias.  Skin:  Negative for color change and rash.  Neurological:  Negative for dizziness, light-headedness and headaches.  Psychiatric/Behavioral:  Negative for agitation and dysphoric mood.       Objective:     BP 130/70   Pulse 60   Temp 97.9 F (36.6 C)   Resp 16   Ht 5' (1.524 m)   Wt 131 lb (59.4 kg)   SpO2 97%   BMI 25.58 kg/m  Wt Readings from Last 3 Encounters:  12/24/20 131 lb (59.4 kg)  10/17/20 130 lb (59 kg)  10/03/20 129 lb 14.4 oz (58.9 kg)    Physical Exam Vitals reviewed.  Constitutional:      General: She is not in acute distress.    Appearance: Normal appearance.  HENT:     Head: Normocephalic and atraumatic.     Right Ear: External ear normal.     Left Ear: External ear normal.  Eyes:     General: No scleral icterus.       Right eye: No discharge.        Left eye: No discharge.     Conjunctiva/sclera: Conjunctivae normal.  Neck:     Thyroid: No thyromegaly.  Cardiovascular:     Rate and Rhythm: Normal rate and regular rhythm.  Pulmonary:     Effort: No respiratory distress.     Breath sounds: Normal breath sounds. No wheezing.  Abdominal:     General: Bowel sounds are normal.     Palpations: Abdomen is soft.     Tenderness: There is no abdominal tenderness.  Musculoskeletal:        General: No swelling or tenderness.     Cervical back: Neck supple. No tenderness.  Lymphadenopathy:     Cervical: No cervical adenopathy.  Skin:    Findings: No erythema or rash.  Neurological:     Mental Status: She is alert.  Psychiatric:        Mood and Affect: Mood  normal.        Behavior: Behavior normal.     Outpatient Encounter Medications as of 12/24/2020  Medication Sig   acetaminophen (TYLENOL) 325 MG tablet Take 650 mg by mouth every 6 (six) hours as needed for moderate pain or headache.   amiodarone (PACERONE) 200  MG tablet Take 1 tablet (200 mg total) by mouth daily.   apixaban (ELIQUIS) 2.5 MG TABS tablet Take 1 tablet (2.5 mg total) by mouth 2 (two) times daily.   benazepril (LOTENSIN) 40 MG tablet Take 1 tablet (40 mg total) by mouth daily.   Calcium Carbonate-Vitamin D 600-400 MG-UNIT tablet Take 1 tablet by mouth in the morning.   felodipine (PLENDIL) 2.5 MG 24 hr tablet Take 1 tablet (2.5 mg total) by mouth daily.   furosemide (LASIX) 20 MG tablet Take 1 tablet (20 mg total) by mouth daily as needed.   Multiple Vitamin (MULTIVITAMIN WITH MINERALS) TABS tablet Take 1 tablet by mouth in the morning.   Omega-3 Fatty Acids (FISH OIL) 1000 MG CAPS Take 1,000 mg by mouth every evening.   rosuvastatin (CRESTOR) 5 MG tablet Take 1 tablet (5 mg total) by mouth daily.   No facility-administered encounter medications on file as of 12/24/2020.     Lab Results  Component Value Date   WBC 5.2 08/27/2020   HGB 13.9 08/27/2020   HCT 40.9 08/27/2020   PLT 181 08/27/2020   GLUCOSE 91 09/13/2020   CHOL 170 09/13/2020   TRIG 78.0 09/13/2020   HDL 82.40 09/13/2020   LDLCALC 72 09/13/2020   ALT 25 09/13/2020   AST 23 09/13/2020   NA 139 09/13/2020   K 4.0 09/13/2020   CL 103 09/13/2020   CREATININE 0.95 09/13/2020   BUN 13 09/13/2020   CO2 27 09/13/2020   TSH 0.764 07/26/2020   HGBA1C 5.8 09/13/2020    NM Myocar Multi W/Spect W/Wall Motion / EF  Result Date: 11/01/2020 Pharmacological myocardial perfusion imaging study with no significant  Ischemia Normal wall motion, EF estimated at 88% No EKG changes concerning for ischemia at peak stress or in recovery. Sinus bradycardia on resting EKG rate 47 up to 53 beats per minute CT attenuation  correction images with moderate coronary calcification and aortic atherosclerosis Low risk scan Signed, Esmond Plants, MD, Ph.D [facility]       Assessment & Plan:   Problem List Items Addressed This Visit     Atrial fibrillation [facility])    S/p cardioversion.  Appears to be in SR today.  Continues on eliquis, amiodarone and metoprolol.        Carcinoma in situ of breast    Recently diagnosed with breast cancer.  S/p breast lumpectomy 06/24/20.  Has seen Dr Bary Castilla, [place] and Dr Rogue Bussing.        Essential hypertension - Primary    Reviewed outside blood pressure checks as outlined.  On metoprolol 12.72m q day. Continue benazepril.  Follow pressures.  Follow metabolic panel.      Relevant Orders   Basic metabolic panel   Hyperglycemia    Low carb diet and exercise.  Follow met b and a1c.       Relevant Orders   Hemoglobin A1c   Hyperlipidemia    Continue crestor.  Low cholesterol diet and exercise.  Follow lipid panel and liver function tests.       Relevant Orders   Hepatic function panel   Lipid panel   Stress    Handling things well.  Follow.         CEinar Pheasant MD

## 2021-01-10 ENCOUNTER — Other Ambulatory Visit: Payer: Self-pay | Admitting: Internal Medicine

## 2021-01-23 DIAGNOSIS — X32XXXA Exposure to sunlight, initial encounter: Secondary | ICD-10-CM | POA: Diagnosis not present

## 2021-01-23 DIAGNOSIS — Z85820 Personal history of malignant melanoma of skin: Secondary | ICD-10-CM | POA: Diagnosis not present

## 2021-01-23 DIAGNOSIS — L565 Disseminated superficial actinic porokeratosis (DSAP): Secondary | ICD-10-CM | POA: Diagnosis not present

## 2021-01-23 DIAGNOSIS — D0461 Carcinoma in situ of skin of right upper limb, including shoulder: Secondary | ICD-10-CM | POA: Diagnosis not present

## 2021-01-23 DIAGNOSIS — D2272 Melanocytic nevi of left lower limb, including hip: Secondary | ICD-10-CM | POA: Diagnosis not present

## 2021-01-23 DIAGNOSIS — L570 Actinic keratosis: Secondary | ICD-10-CM | POA: Diagnosis not present

## 2021-01-23 DIAGNOSIS — D2262 Melanocytic nevi of left upper limb, including shoulder: Secondary | ICD-10-CM | POA: Diagnosis not present

## 2021-01-23 DIAGNOSIS — Z85828 Personal history of other malignant neoplasm of skin: Secondary | ICD-10-CM | POA: Diagnosis not present

## 2021-01-23 DIAGNOSIS — D485 Neoplasm of uncertain behavior of skin: Secondary | ICD-10-CM | POA: Diagnosis not present

## 2021-01-31 ENCOUNTER — Other Ambulatory Visit: Payer: Self-pay

## 2021-01-31 ENCOUNTER — Encounter: Payer: Self-pay | Admitting: Internal Medicine

## 2021-01-31 ENCOUNTER — Ambulatory Visit (INDEPENDENT_AMBULATORY_CARE_PROVIDER_SITE_OTHER): Payer: PPO | Admitting: Internal Medicine

## 2021-01-31 VITALS — BP 140/68 | HR 49 | Ht 60.0 in | Wt 130.5 lb

## 2021-01-31 DIAGNOSIS — Z79899 Other long term (current) drug therapy: Secondary | ICD-10-CM

## 2021-01-31 DIAGNOSIS — E782 Mixed hyperlipidemia: Secondary | ICD-10-CM | POA: Diagnosis not present

## 2021-01-31 DIAGNOSIS — I10 Essential (primary) hypertension: Secondary | ICD-10-CM | POA: Diagnosis not present

## 2021-01-31 DIAGNOSIS — I4819 Other persistent atrial fibrillation: Secondary | ICD-10-CM | POA: Diagnosis not present

## 2021-01-31 MED ORDER — AMIODARONE HCL 200 MG PO TABS: 200.0000 mg | ORAL_TABLET | Freq: Every day | ORAL | 3 refills | Status: DC

## 2021-01-31 NOTE — Progress Notes (Signed)
Follow-up Outpatient Visit Date: 01/31/2021  Primary Care Provider: Einar Pheasant, [address] [place] [address]  Chief Complaint: Follow-up atrial fibrillation and hypertension  HPI:  Erin [name] is a 87 y.o. female with history of persistent atrial fibrillation, hypertension, hyperlipidemia, and breast cancer, who presents for follow-up of atrial fibrillation.  She was last seen in our office in August by Tarri Glenn, [place], at which time she was doing fairly well but continued to complain of decreased energy.  Home heart rates were noted to be in the 50s despite de-escalation of metoprolol at her prior visit in late June.  She reported "twinges" of left-sided chest pain following breast cancer surgery.  She was referred for myocardial perfusion stress test, which was low risk without evidence of ischemia or scar.  Coronary artery calcification was noted.  Today, Ms. Pauli reports that she has been feeling fairly well.  She still has some exertional dyspnea when walking, which is stable.  Overall, she feels better with maintenance of sinus rhythm.  She notes that her heart rate is typically in the 40s and 50s during the day though her apple watch sometimes alerts her that her heart rate dips into the 30s overnight when she is asleep.  She denies chest pain, palpitations, lightheadedness, and edema.  She denies bleeding.  --------------------------------------------------------------------------------------------------  Cardiovascular History & Procedures: Cardiovascular Problems: Persistent atrial fibrillation Atypical chest pain   Risk Factors: Hypertension, hyperlipidemia, and age greater than 70   Cath/PCI: None   CV Surgery: None   EP Procedures and Devices: DCCV (09/03/2020) DCCV (07/30/2020): Unsuccessful - amiodarone load started Event monitor (10/07/16): Predominantly sinus rhythm with frequent PACs and occasional PVCs. Single episode of NSVT.  Multiple atrial runs lasting up to 19.4 seconds. No sustained arrhythmias. Event monitor (07/07/16): Predominantly sinus rhythm with 20 episodes of narrow complex tachycardia and irregularity consistent with atrial fibrillation (longest episode noted approximately 12 seconds). Frequent PACs and rare PVCs were noted.   Non-Invasive Evaluation(s): Pharmacologic MPI (10/31/2020): Low risk study without ischemia or scar.  LVEF greater than 65%.  Coronary artery calcification and aortic atherosclerosis noted. TTE (07/04/2020): Normal LV size and wall thickness.  LVEF 60-65% with normal wall motion.  Normal RV size and function.  Moderate left atrial enlargement.  No significant valvular abnormality. TTE (08/06/16): Normal LV size with LVEF of 55-65%. Normal wall motion with grade 1 diastolic dysfunction. Mild left atrial enlargement. Normal RV size and function. Exercise MPI (07/13/16): Low risk study without ischemia or scar. LVEF greater than 65%. Decreased exercise capacity, exercising 4 minutes, zero seconds, achieving 117 bpm (90% MPHR; 4.6 METs).  Recent CV Pertinent Labs: Lab Results  Component Value Date   CHOL 183 12/24/2020   HDL 94.40 12/24/2020   LDLCALC 74 12/24/2020   TRIG 75.0 12/24/2020   CHOLHDL 2 12/24/2020   K 4.3 12/24/2020   K 3.5 12/21/2011   MG 2.0 06/07/2020   BUN 20 12/24/2020   CREATININE 0.98 12/24/2020   CREATININE 0.92 (H) 06/07/2020    Past medical and surgical history were reviewed and updated in EPIC.  Current Meds  Medication Sig   acetaminophen (TYLENOL) 325 MG tablet Take 650 mg by mouth every 6 (six) hours as needed for moderate pain or headache.   amiodarone (PACERONE) 200 MG tablet Take 1 tablet (200 mg total) by mouth daily.   apixaban (ELIQUIS) 2.5 MG TABS tablet Take 1 tablet (2.5 mg total) by mouth 2 (two) times daily.  benazepril (LOTENSIN) 40 MG tablet Take 1 tablet (40 mg total) by mouth daily.   Calcium Carbonate-Vitamin D 600-400 MG-UNIT tablet  Take 1 tablet by mouth in the morning.   felodipine (PLENDIL) 2.5 MG 24 hr tablet Take 1 tablet (2.5 mg total) by mouth daily.   furosemide (LASIX) 20 MG tablet Take 1 tablet (20 mg total) by mouth daily as needed.   Multiple Vitamin (MULTIVITAMIN WITH MINERALS) TABS tablet Take 1 tablet by mouth in the morning.   Omega-3 Fatty Acids (FISH OIL) 1000 MG CAPS Take 1,000 mg by mouth every evening.   rosuvastatin (CRESTOR) 5 MG tablet Take 1 tablet (5 mg total) by mouth daily.    Allergies: Erythromycin and Minocin [minocycline hcl]  Social History   Tobacco Use   Smoking status: Former   Smokeless tobacco: Never  Vaping Use   Vaping Use: Never used  Substance Use Topics   Alcohol use: Yes    Alcohol/week: 0.0 standard drinks    Comment: occassionally   Drug use: No    Family History  Problem Relation Age of Onset   Hypertension Mother    Hypertension Sister    Multiple sclerosis Sister        died 51   Heart disease Brother        S/P CABG   Breast cancer Maternal Aunt     Review of Systems: A 12-system review of systems was performed and was negative except as noted in the HPI.  --------------------------------------------------------------------------------------------------  Physical Exam: BP 140/68 (BP Location: Left Arm, Patient Position: Sitting, Cuff Size: Normal)   Pulse (!) 49   Ht 5' (1.524 m)   Wt 130 lb 8 oz (59.2 kg)   SpO2 98%   BMI 25.49 kg/m   General:  NAD. Neck: No JVD or HJR. Lungs: Clear to auscultation bilaterally without wheezes or crackles. Heart: Bradycardic but regular without murmurs, rubs, or gallops. Abdomen: Soft, nontender, nondistended. Extremities: No lower extremity edema.  EKG: Sinus bradycardia (heart rate 49 bpm).  Otherwise, no significant abnormality.  Lab Results  Component Value Date   WBC 5.2 08/27/2020   HGB 13.9 08/27/2020   HCT 40.9 08/27/2020   MCV 95.8 08/27/2020   PLT 181 08/27/2020    Lab Results   Component Value Date   NA 139 12/24/2020   K 4.3 12/24/2020   CL 102 12/24/2020   CO2 30 12/24/2020   BUN 20 12/24/2020   CREATININE 0.98 12/24/2020   GLUCOSE 99 12/24/2020   ALT 22 12/24/2020    Lab Results  Component Value Date   CHOL 183 12/24/2020   HDL 94.40 12/24/2020   LDLCALC 74 12/24/2020   TRIG 75.0 12/24/2020   CHOLHDL 2 12/24/2020    --------------------------------------------------------------------------------------------------  ASSESSMENT AND PLAN: Persistent atrial fibrillation: Ms. Wilcoxen is maintaining sinus rhythm following repeat cardioversion on amiodarone.  She continues to be quite bradycardic though minimally symptomatic.  We discussed ambulatory cardiac monitoring to get a better sense of her heart rate response, though she notes that her heart rate increases into the 70s when she walks, suggesting some degree of chronotropic competence.  We have agreed to defer monitoring as well as referral to EP at this time, though if her heart rate continues to decline or she develops symptoms of bradycardia, I would have a low threshold for EP consultation.  Recent LFTs drawn by Dr. Nicki Reaper were normal.  We will check a TSH today in the setting of amiodarone use.  Plan to  continue amiodarone 200 mg daily and apixaban 2.5 mg twice daily (dose reduced based on weight and age).  Hypertension: Blood pressure borderline elevated today but typically lower.  Continue current doses of benazepril and for low to pain.  Continue as needed furosemide for intermittent leg edema.  Hyperlipidemia: Lipids reasonable on recent check by Dr. Nicki Reaper.  Continue low-dose rosuvastatin with ongoing management per Dr. Nicki Reaper.  Follow-up: Return to clinic in 6 months.  Nelva Bush, MD 01/31/2021 10:10 AM

## 2021-01-31 NOTE — Patient Instructions (Signed)
Medication Instructions:   Your physician recommends that you continue on your current medications as directed. Please refer to the Current Medication list given to you today.  *If you need a refill on your cardiac medications before your next appointment, please call your pharmacy*   Lab Work:  TSH today  If you have labs (blood work) drawn today and your tests are completely normal, you will receive your results only by: [place] (if you have MyChart) OR A paper copy in the mail If you have any lab test that is abnormal or we need to change your treatment, we will call you to review the results.   Testing/Procedures:  None ordered   Follow-Up: At [facility], you and your health needs are our priority.  As part of our continuing mission to provide you with exceptional heart care, we have created designated Provider Care Teams.  These Care Teams include your primary Cardiologist (physician) and Advanced Practice Providers (APPs -  Physician Assistants and Nurse Practitioners) who all work together to provide you with the care you need, when you need it.  We recommend signing up for the patient portal called "MyChart".  Sign up information is provided on this After Visit Summary.  MyChart is used to connect with patients for Virtual Visits (Telemedicine).  Patients are able to view lab/test results, encounter notes, upcoming appointments, etc.  Non-urgent messages can be sent to your provider as well.   To learn more about what you can do with MyChart, go to [email or web address].    Your next appointment:   6 month(s)  The format for your next appointment:   In Person  Provider:   You may see Nelva Bush, MD or one of the following Advanced Practice Providers on your designated Care Team:   Murray Hodgkins, NP Christell Faith, PA-C Cadence Kathlen Mody, [place]

## 2021-02-01 ENCOUNTER — Encounter: Payer: Self-pay | Admitting: Internal Medicine

## 2021-02-01 LAB — TSH: TSH: 1.01 u[IU]/mL (ref 0.450–4.500)

## 2021-02-11 ENCOUNTER — Telehealth: Payer: Self-pay | Admitting: Internal Medicine

## 2021-02-11 ENCOUNTER — Other Ambulatory Visit: Payer: Self-pay | Admitting: Internal Medicine

## 2021-02-11 NOTE — Telephone Encounter (Signed)
Pt called in regards to medication. Pt typically recieves a 90 supply for this medication. Pt ran out of previous prescription so the pharmacy filled a prescription for the medication that was a 30 day supply that was originally sent in. Pt needs a 90 supply for apixaban (ELIQUIS) 2.5 MG TABS tablet  This also effects the cost of the medication. When she gets a 90 day supply it's typically $90 when she gets a 30 day supply it's typically $45 Pt uses [facility] in [place].

## 2021-02-11 NOTE — Telephone Encounter (Signed)
Called patient to clarify. Dr End typically follows this and said that she got a 30 day rx. Patient stated nothing needed at this time and disregard. Advised to let me know if she needed something or need [place] to any thing with her prescription. Patient gave verbal understanding.

## 2021-02-19 DIAGNOSIS — H903 Sensorineural hearing loss, bilateral: Secondary | ICD-10-CM | POA: Diagnosis not present

## 2021-02-19 DIAGNOSIS — H6123 Impacted cerumen, bilateral: Secondary | ICD-10-CM | POA: Diagnosis not present

## 2021-03-13 ENCOUNTER — Encounter: Payer: Self-pay | Admitting: Radiation Oncology

## 2021-03-13 ENCOUNTER — Other Ambulatory Visit: Payer: Self-pay

## 2021-03-13 ENCOUNTER — Ambulatory Visit
Admission: RE | Admit: 2021-03-13 | Discharge: 2021-03-13 | Disposition: A | Discharge status: Home / Self Care | Payer: PPO | Source: Ambulatory Visit | Attending: Radiation Oncology | Admitting: Radiation Oncology

## 2021-03-13 ENCOUNTER — Other Ambulatory Visit: Payer: Self-pay | Admitting: Internal Medicine

## 2021-03-13 VITALS — BP 158/64 | HR 51 | Temp 98.4°F | Wt 131.0 lb

## 2021-03-13 DIAGNOSIS — Z923 Personal history of irradiation: Secondary | ICD-10-CM | POA: Diagnosis not present

## 2021-03-13 DIAGNOSIS — C50412 Malignant neoplasm of upper-outer quadrant of left female breast: Secondary | ICD-10-CM | POA: Diagnosis not present

## 2021-03-13 DIAGNOSIS — I4819 Other persistent atrial fibrillation: Secondary | ICD-10-CM

## 2021-03-13 DIAGNOSIS — Z08 Encounter for follow-up examination after completed treatment for malignant neoplasm: Secondary | ICD-10-CM | POA: Diagnosis not present

## 2021-03-13 DIAGNOSIS — Z170 Estrogen receptor positive status [ER+]: Secondary | ICD-10-CM | POA: Diagnosis not present

## 2021-03-13 NOTE — Progress Notes (Signed)
Radiation Oncology Follow up Note  Name: Erin [name]   Date:   03/13/2021 MRN:  [id number] DOB: 20-Jun-1930    This 87 y.o. female presents to the clinic today for 48-month follow-up status post whole breast radiation to her left breast for ER positive ductal carcinoma in situ.  REFERRING PROVIDER: Einar Pheasant, MD  HPI: Patient is an 87 year old female now out 6 months having pleated whole breast radiation to her left breast for ER positive ductal carcinoma in situ.  Seen today in routine follow-up she is doing well.  She specifically denies breast tenderness cough or bone pain..  She has not yet had a mammogram.  She is not on antiestrogen therapy.  COMPLICATIONS OF TREATMENT: none  FOLLOW UP COMPLIANCE: keeps appointments   PHYSICAL EXAM:  BP (!) 158/64    Pulse (!) 51    Temp 98.4 F (36.9 C) (Tympanic)    Wt 131 lb (59.4 kg)    BMI 25.58 kg/m  Lungs are clear to A&P cardiac examination essentially unremarkable with regular rate and rhythm. No dominant mass or nodularity is noted in either breast in 2 positions examined. Incision is well-healed. No axillary or supraclavicular adenopathy is appreciated. Cosmetic result is excellent.  Well-developed well-nourished patient in NAD. HEENT reveals PERLA, EOMI, discs not visualized.  Oral cavity is clear. No oral mucosal lesions are identified. Neck is clear without evidence of cervical or supraclavicular adenopathy. Lungs are clear to A&P. Cardiac examination is essentially unremarkable with regular rate and rhythm without murmur rub or thrill. Abdomen is benign with no organomegaly or masses noted. Motor sensory and DTR levels are equal and symmetric in the upper and lower extremities. Cranial nerves II through XII are grossly intact. Proprioception is intact. No peripheral adenopathy or edema is identified. No motor or sensory levels are noted. Crude visual fields are within normal range.  RADIOLOGY RESULTS: No current films to  review  PLAN: Present time patient is doing well now out 6 months having completed whole breast radiation.  Pleased with her overall progress.  Of asked to see her back in 6 months and then will start once year follow-up visits.  I would like to take this opportunity to thank you for allowing me to participate in the care of your patient.Noreene Filbert, MD

## 2021-03-13 NOTE — Progress Notes (Signed)
Survivorship Care Plan visit completed.  Treatment summary reviewed and given to patient.  ASCO answers booklet reviewed and given to patient.  CARE program and Cancer Transitions discussed with patient along with other resources cancer center offers to patients and caregivers.  Patient verbalized understanding.    

## 2021-03-13 NOTE — Telephone Encounter (Signed)
Pt called in stating that the pharmacy is still having issues with the prescription for medication (apixaban (ELIQUIS) 2.5 MG TABS tablet). Pt would like to speak with you about the confusing.  Pt requesting callback

## 2021-03-14 ENCOUNTER — Other Ambulatory Visit: Payer: Self-pay

## 2021-03-14 ENCOUNTER — Telehealth: Payer: Self-pay | Admitting: Internal Medicine

## 2021-03-14 DIAGNOSIS — I4819 Other persistent atrial fibrillation: Secondary | ICD-10-CM

## 2021-03-14 MED ORDER — APIXABAN 2.5 MG PO TABS: ORAL_TABLET | ORAL | 1 refills | Status: DC

## 2021-03-14 NOTE — Telephone Encounter (Signed)
See other phone note

## 2021-03-14 NOTE — Telephone Encounter (Signed)
I called and spoke with patient as well as Occupational hygienist. Pt does have a 14 day supply & she was also in the donut hole for the year. She does have enough until next week & I have resent in the a 90 supply for her bc was initially sent in incorrectly. She said that she will be intouch with Dr. Darnelle Bos office they are the ones who usually refill medication.

## 2021-03-14 NOTE — Telephone Encounter (Signed)
LMTCB

## 2021-03-14 NOTE — Telephone Encounter (Signed)
Pt is returning call that she received from [place]

## 2021-03-18 ENCOUNTER — Other Ambulatory Visit: Payer: Self-pay | Admitting: Internal Medicine

## 2021-03-18 DIAGNOSIS — I4819 Other persistent atrial fibrillation: Secondary | ICD-10-CM

## 2021-03-18 NOTE — Telephone Encounter (Signed)
Refill Request.  

## 2021-03-18 NOTE — Telephone Encounter (Signed)
Eliquis 2.5 mg refill request received. Patient is 88 years old, weight- 59.4 kg, Crea- 0.98 on 12/24/20, Diagnosis- afib, and last seen by Dr. Saunders Revel on 01/31/21. Dose is appropriate based on dosing criteria. Will send in refill to requested pharmacy.

## 2021-03-31 ENCOUNTER — Inpatient Hospital Stay: Payer: PPO | Admitting: Internal Medicine

## 2021-03-31 ENCOUNTER — Other Ambulatory Visit: Payer: Self-pay

## 2021-03-31 ENCOUNTER — Encounter: Payer: Self-pay | Admitting: Internal Medicine

## 2021-03-31 ENCOUNTER — Inpatient Hospital Stay: Payer: PPO | Attending: Internal Medicine

## 2021-03-31 DIAGNOSIS — Z923 Personal history of irradiation: Secondary | ICD-10-CM | POA: Diagnosis not present

## 2021-03-31 DIAGNOSIS — G8929 Other chronic pain: Secondary | ICD-10-CM | POA: Insufficient documentation

## 2021-03-31 DIAGNOSIS — M2550 Pain in unspecified joint: Secondary | ICD-10-CM | POA: Diagnosis not present

## 2021-03-31 DIAGNOSIS — I4891 Unspecified atrial fibrillation: Secondary | ICD-10-CM | POA: Insufficient documentation

## 2021-03-31 DIAGNOSIS — Z7901 Long term (current) use of anticoagulants: Secondary | ICD-10-CM | POA: Diagnosis not present

## 2021-03-31 DIAGNOSIS — C50412 Malignant neoplasm of upper-outer quadrant of left female breast: Secondary | ICD-10-CM

## 2021-03-31 DIAGNOSIS — Z87891 Personal history of nicotine dependence: Secondary | ICD-10-CM | POA: Insufficient documentation

## 2021-03-31 DIAGNOSIS — Z170 Estrogen receptor positive status [ER+]: Secondary | ICD-10-CM | POA: Diagnosis not present

## 2021-03-31 DIAGNOSIS — Z79899 Other long term (current) drug therapy: Secondary | ICD-10-CM | POA: Diagnosis not present

## 2021-03-31 DIAGNOSIS — M8580 Other specified disorders of bone density and structure, unspecified site: Secondary | ICD-10-CM | POA: Insufficient documentation

## 2021-03-31 DIAGNOSIS — R5383 Other fatigue: Secondary | ICD-10-CM | POA: Diagnosis not present

## 2021-03-31 LAB — CBC WITH DIFFERENTIAL/PLATELET
Abs Immature Granulocytes: 0.02 10*3/uL (ref 0.00–0.07)
Basophils Absolute: 0 10*3/uL (ref 0.0–0.1)
Basophils Relative: 1 %
Eosinophils Absolute: 0.1 10*3/uL (ref 0.0–0.5)
Eosinophils Relative: 1 %
HCT: 43.7 % (ref 36.0–46.0)
Hemoglobin: 14.4 g/dL (ref 12.0–15.0)
Immature Granulocytes: 0 %
Lymphocytes Relative: 19 %
Lymphs Abs: 1 10*3/uL (ref 0.7–4.0)
MCH: 32.7 pg (ref 26.0–34.0)
MCHC: 33 g/dL (ref 30.0–36.0)
MCV: 99.3 fL (ref 80.0–100.0)
Monocytes Absolute: 0.7 10*3/uL (ref 0.1–1.0)
Monocytes Relative: 12 %
Neutro Abs: 3.5 10*3/uL (ref 1.7–7.7)
Neutrophils Relative %: 67 %
Platelets: 188 10*3/uL (ref 150–400)
RBC: 4.4 MIL/uL (ref 3.87–5.11)
RDW: 13.2 % (ref 11.5–15.5)
WBC: 5.3 10*3/uL (ref 4.0–10.5)
nRBC: 0 % (ref 0.0–0.2)

## 2021-03-31 LAB — COMPREHENSIVE METABOLIC PANEL
ALT: 30 U/L (ref 0–44)
AST: 30 U/L (ref 15–41)
Albumin: 4.4 g/dL (ref 3.5–5.0)
Alkaline Phosphatase: 76 U/L (ref 38–126)
Anion gap: 6 (ref 5–15)
BUN: 21 mg/dL (ref 8–23)
CO2: 30 mmol/L (ref 22–32)
Calcium: 9.6 mg/dL (ref 8.9–10.3)
Chloride: 99 mmol/L (ref 98–111)
Creatinine, Ser: 1.05 mg/dL — ABNORMAL HIGH (ref 0.44–1.00)
GFR, Estimated: 51 mL/min — ABNORMAL LOW (ref >60)
Glucose, Bld: 95 mg/dL (ref 70–99)
Potassium: 4.7 mmol/L (ref 3.5–5.1)
Sodium: 135 mmol/L (ref 135–145)
Total Bilirubin: 0.8 mg/dL (ref 0.3–1.2)
Total Protein: 7.2 g/dL (ref 6.5–8.1)

## 2021-03-31 NOTE — Progress Notes (Signed)
Pt wanting to know should she have a mammogram?

## 2021-03-31 NOTE — Assessment & Plan Note (Addendum)
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 STAGE-0- pTispN0; ER/PR-POSITIVE Her 2 NEGATIVE.  DCIS margin-positive/unifocal.  S/p lumpectomy [Dr.Byrnett]; s/p radiation.  #Currently will be adjuvant/prophylactic antiestrogen therapy given the microinvasive cancer/DCIS.  However the benefits of treatment should be weighed in the context of her multiple medical problems/cardiac-A. Fib; overall low risk of recurrence; and advanced age.  I think is reasonable to hold off any antiestrogen therapy at this time.  #Recommend patient continue follow-up with Dr. Burman Blacksmith  # A.fib [s/p cardioversion x2; Dr.End]- on Elquis; on metoprolol-clinically in A. Fib; no RVR.  Stable-cardiology  # Osteopenia [2020]-continue calcium plus vitamin D.  Defer to PCP for further management.  #As patient is not on any active treatment as per oncology and since patient is clinically stable I think is reasonable for the patient to follow-up with PCP/can follow-up with [place] as needed.  Patient comfortable with the plan; to call us if any questions or concerns in the interim.  # DISPOSITION: # Follow up as needed- Dr.B

## 2021-03-31 NOTE — Progress Notes (Signed)
[facility] NOTE  Patient Care Team: Einar Pheasant, MD as PCP - General (Internal Medicine) End, Harrell Gave, MD as PCP - Cardiology (Cardiology) Theodore Demark, RN as Oncology Nurse Navigator Noreene Filbert, MD as Consulting Physician (Radiation Oncology) Cammie Sickle, MD as Consulting Physician (Oncology) Bary Castilla Forest Gleason, MD as Consulting Physician (General Surgery) Emmaline Kluver., MD as Consulting Physician (Rheumatology)  CHIEF COMPLAINTS/PURPOSE OF CONSULTATION: Breast cancer  #  Oncology History Overview Note  Histologic type: Micro-invasive carcinoma arising in papillary carcinoma; [Papillary carcinoma is a  heterogenous group of malignant in situ and invasive neoplasms that are  staged and treated as pTis.  Arising from the papillary carcinoma is a single focus of microinvasive  carcinoma, measuring 0.44 mm. ]  Histologic grade (Nottingham Histologic Score)                       Glandular (Acinar)/Tubular Differentiation: 3                       Nuclear Pleomorphism: 2                       Mitotic Rate: 1                       Overall Grade: 2  Tumor Size: Microinvasion only (less than or equal to 1.0 mm)  Ductal carcinoma in situ (DCIS): Present       Positive for extensive intraductal component (EIC).  Lymphovascular Invasion: Not identified   ER/PR- POSITIVE; Her-2 negative.   #April 2022-s/p adjuvant radiation; given the risk versus benefit age-hold off adjuvant anastrozole.  # A.fib/eliquis [Dr.End]; A. fib with RVR needing cardioversion x2. on currently on Eliquis.     Carcinoma of upper-outer quadrant of left breast in female, estrogen receptor positive ([place])  07/10/2020 Initial Diagnosis   Carcinoma of upper-outer quadrant of left breast in female, estrogen receptor positive ([place])     HISTORY OF PRESENTING ILLNESS: Alone; ambulating with a cane.   Erin [name] 88 y.o.  female recently diagnosed microinvasive left  breast cancer ER/PR/DCIS status post lumpectomy; positive margin status post radiation is here for follow-up.  Patient states her A. fib is still bothering her.  Continues to have chronic mild fatigue.  Chronic joint pains.  Walks with a cane.  Concerned about potential side effects of any further therapies.  Review of Systems  Constitutional:  Positive for malaise/fatigue. Negative for chills, diaphoresis, fever and weight loss.  HENT:  Negative for nosebleeds and sore throat.   Eyes:  Negative for double vision.  Respiratory:  Negative for cough, hemoptysis, sputum production, shortness of breath and wheezing.   Cardiovascular:  Negative for chest pain, palpitations, orthopnea and leg swelling.  Gastrointestinal:  Negative for abdominal pain, blood in stool, constipation, diarrhea, heartburn, melena, nausea and vomiting.  Genitourinary:  Negative for dysuria, frequency and urgency.  Musculoskeletal:  Positive for joint pain and myalgias. Negative for back pain.  Skin: Negative.  Negative for itching and rash.  Neurological:  Negative for dizziness, tingling, focal weakness, weakness and headaches.  Endo/Heme/Allergies:  Does not bruise/bleed easily.  Psychiatric/Behavioral:  Negative for depression. The patient is not nervous/anxious and does not have insomnia.     MEDICAL HISTORY:  Past Medical History:  Diagnosis Date   A-fib [facility])    Breast cancer ([place])    Cancer ([place])  melanoma - arm left   Chest pain 07/13/2016   History of colon polyps    Hypercholesterolemia    Hypertension     SURGICAL HISTORY: Past Surgical History:  Procedure Laterality Date   APPENDECTOMY  1946   BREAST BIOPSY Left 06/04/2020   [place] bx, vision marker, path pending   BREAST LUMPECTOMY WITH SENTINEL LYMPH NODE BIOPSY Left 06/24/2020   Procedure: BREAST LUMPECTOMY WITH SENTINEL LYMPH NODE BIOPSY;  Surgeon: Robert Bellow, MD;  Location: [facility];  Service: General;  Laterality: Left;    CARDIOVERSION N/A 07/30/2020   Procedure: CARDIOVERSION;  Surgeon: Nelva Bush, MD;  Location: [facility];  Service: Cardiovascular;  Laterality: N/A;   CARDIOVERSION N/A 09/03/2020   Procedure: CARDIOVERSION;  Surgeon: Nelva Bush, MD;  Location: [facility];  Service: Cardiovascular;  Laterality: N/A;   CATARACT EXTRACTION Bilateral    DILATION AND CURETTAGE OF UTERUS     LAMINECTOMY  2000   release of foraminal stenosis  2003    SOCIAL HISTORY: Social History   Socioeconomic History   Marital status: Widowed    Spouse name: Not on file   Number of children: 4   Years of education: Not on file   Highest education level: Not on file  Occupational History   Occupation: retired  Tobacco Use   Smoking status: Former   Smokeless tobacco: Never  Scientific laboratory technician Use: Never used  Substance and Sexual Activity   Alcohol use: Yes    Alcohol/week: 0.0 standard drinks    Comment: occassionally   Drug use: No   Sexual activity: Never  Other Topics Concern   Not on file  Social History Narrative   Not on file   Social Determinants of Health   Financial Resource Strain: Low Risk    Difficulty of Paying Living Expenses: Not hard at all  Food Insecurity: No Food Insecurity   Worried About Charity fundraiser in the Last Year: Never true   [place] in the Last Year: Never true  Transportation Needs: No Transportation Needs   Lack of Transportation (Medical): No   Lack of Transportation (Non-Medical): No  Physical Activity: Sufficiently Active   Days of Exercise per Week: 5 days   Minutes of Exercise per Session: 30 min  Stress: No Stress Concern Present   Feeling of Stress : Not at all  Social Connections: Unknown   Frequency of Communication with Friends and Family: More than three times a week   Frequency of Social Gatherings with Friends and Family: Twice a week   Attends Religious Services: More than 4 times per year   Active Member of [employer] or Organizations:  Yes   Attends Music therapist: Not on file   Marital Status: Not on file  Intimate Partner Violence: Not At Risk   Fear of Current or Ex-Partner: No   Emotionally Abused: No   Physically Abused: No   Sexually Abused: No    FAMILY HISTORY: Family History  Problem Relation Age of Onset   Hypertension Mother    Hypertension Sister    Multiple sclerosis Sister        died 66   Heart disease Brother        S/P CABG   Breast cancer Maternal Aunt     ALLERGIES:  is allergic to erythromycin and minocin [minocycline hcl].  MEDICATIONS:  Current Outpatient Medications  Medication Sig Dispense Refill   acetaminophen (TYLENOL) 325 MG tablet Take 650  mg by mouth every 6 (six) hours as needed for moderate pain or headache.     amiodarone (PACERONE) 200 MG tablet Take 1 tablet (200 mg total) by mouth daily. 90 tablet 3   apixaban (ELIQUIS) 2.5 MG TABS tablet Take 1 tablet (2.5 mg total) by mouth 2 (two) times daily. 180 tablet 1   benazepril (LOTENSIN) 40 MG tablet Take 1 tablet (40 mg total) by mouth daily. 180 tablet 1   Calcium Carbonate-Vitamin D 600-400 MG-UNIT tablet Take 1 tablet by mouth in the morning.     felodipine (PLENDIL) 2.5 MG 24 hr tablet Take 1 tablet (2.5 mg total) by mouth daily. 90 tablet 0   furosemide (LASIX) 20 MG tablet Take 1 tablet (20 mg total) by mouth daily as needed. 90 tablet 0   Multiple Vitamin (MULTIVITAMIN WITH MINERALS) TABS tablet Take 1 tablet by mouth in the morning.     Omega-3 Fatty Acids (FISH OIL) 1000 MG CAPS Take 1,000 mg by mouth every evening.     rosuvastatin (CRESTOR) 5 MG tablet Take 1 tablet (5 mg total) by mouth daily. 90 tablet 0   No current facility-administered medications for this visit.      Marland Kitchen  PHYSICAL EXAMINATION: ECOG PERFORMANCE STATUS: 0 - Asymptomatic  Vitals:   03/31/21 1024  BP: (!) 186/69  Pulse: (!) 56  Temp: (!) 97.3 F (36.3 C)  SpO2: 98%   Filed Weights   03/31/21 1024  Weight: 130 lb (59  kg)    Physical Exam Constitutional:      Comments: Walks with a cane.  Alone.  HENT:     Head: Normocephalic and atraumatic.     Mouth/Throat:     Pharynx: No oropharyngeal exudate.  Eyes:     Pupils: Pupils are equal, round, and reactive to light.  Cardiovascular:     Rate and Rhythm: Normal rate. Rhythm irregular.  Pulmonary:     Effort: Pulmonary effort is normal. No respiratory distress.     Breath sounds: Normal breath sounds. No wheezing.  Abdominal:     General: Bowel sounds are normal. There is no distension.     Palpations: Abdomen is soft. There is no mass.     Tenderness: There is no abdominal tenderness. There is no guarding or rebound.  Musculoskeletal:        General: No tenderness. Normal range of motion.     Cervical back: Normal range of motion and neck supple.  Skin:    General: Skin is warm.  Neurological:     Mental Status: She is alert and oriented to person, place, and time.  Psychiatric:        Mood and Affect: Affect normal.     LABORATORY DATA:  I have reviewed the data as listed Lab Results  Component Value Date   WBC 5.3 03/31/2021   HGB 14.4 03/31/2021   HCT 43.7 03/31/2021   MCV 99.3 03/31/2021   PLT 188 03/31/2021   Recent Labs    07/26/20 0926 08/06/20 0957 08/27/20 0944 09/13/20 0755 12/24/20 0806 03/31/21 0954  NA 140  --  135 139 139 135  K 4.2  --  4.1 4.0 4.3 4.7  CL 105  --  102 103 102 99  CO2 27  --  _0 GLUCOSE 97  --  130* 91 99 95  BUN 21  --  _1 CREATININE 0.80  --  1.04* 0.95 0.98 1.05*  CALCIUM  9.9  --  9.6 9.9 9.9 9.6  GFRNONAA >60  --  52*  --   --  51*  PROT 6.9 6.7  --  6.8 6.6 7.2  ALBUMIN 4.1 4.5  --  4.4 4.3 4.4  AST 59* 20  --  _0 ALT 52* 22  --  _1 ALKPHOS 77 71  --  65 69 76  BILITOT 0.9 0.6  --  0.8 0.7 0.8  BILIDIR 0.1 0.1  --  0.2 0.2  --   IBILI 0.8  --   --   --   --   --     RADIOGRAPHIC STUDIES: I have personally reviewed the radiological images as  listed and agreed with the findings in the report. No results found.   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 STAGE-0- pTispN0; ER/PR-POSITIVE Her 2 NEGATIVE.  DCIS margin-positive/unifocal.  S/p lumpectomy [Dr.Byrnett]; s/p radiation.  #Currently will be adjuvant/prophylactic antiestrogen therapy given the microinvasive cancer/DCIS.  However the benefits of treatment should be weighed in the context of her multiple medical problems/cardiac-A. Fib; overall low risk of recurrence; and advanced age.  I think is reasonable to hold off any antiestrogen therapy at this time.  #Recommend patient continue follow-up with Dr. Burman Blacksmith  # A.fib [s/p cardioversion x2; Dr.End]- on Elquis; on metoprolol-clinically in A. Fib; no RVR.  Stable-cardiology  # Osteopenia [2020]-continue calcium plus vitamin D.  Defer to PCP for further management.  #As patient is not on any active treatment as per oncology and since patient is clinically stable I think is reasonable for the patient to follow-up with PCP/can follow-up with [place] as needed.  Patient comfortable with the plan; to call us if any questions or concerns in the interim.  # DISPOSITION: # Follow up as needed- Dr.B  All questions were answered. The patient/family knows to call the clinic with any problems, questions or concerns.    Cammie Sickle, MD 03/31/2021 8:03 PM

## 2021-04-01 DIAGNOSIS — D049 Carcinoma in situ of skin, unspecified: Secondary | ICD-10-CM | POA: Diagnosis not present

## 2021-04-01 DIAGNOSIS — D0461 Carcinoma in situ of skin of right upper limb, including shoulder: Secondary | ICD-10-CM | POA: Diagnosis not present

## 2021-04-01 DIAGNOSIS — Z481 Encounter for planned postprocedural wound closure: Secondary | ICD-10-CM | POA: Diagnosis not present

## 2021-04-11 ENCOUNTER — Other Ambulatory Visit: Payer: Self-pay | Admitting: Internal Medicine

## 2021-04-11 NOTE — Telephone Encounter (Signed)
Last apt 12/24/20 Next apt 05/01/21 Sent in refill

## 2021-04-28 ENCOUNTER — Other Ambulatory Visit: Payer: Self-pay | Admitting: General Surgery

## 2021-04-28 DIAGNOSIS — D0512 Intraductal carcinoma in situ of left breast: Secondary | ICD-10-CM

## 2021-05-01 ENCOUNTER — Other Ambulatory Visit: Payer: Self-pay

## 2021-05-01 ENCOUNTER — Ambulatory Visit: Payer: PPO

## 2021-05-01 ENCOUNTER — Ambulatory Visit (INDEPENDENT_AMBULATORY_CARE_PROVIDER_SITE_OTHER): Payer: PPO | Admitting: Internal Medicine

## 2021-05-01 VITALS — BP 130/72 | HR 60 | Temp 97.9°F | Resp 16 | Ht 60.0 in | Wt 129.8 lb

## 2021-05-01 DIAGNOSIS — R739 Hyperglycemia, unspecified: Secondary | ICD-10-CM | POA: Diagnosis not present

## 2021-05-01 DIAGNOSIS — I4819 Other persistent atrial fibrillation: Secondary | ICD-10-CM

## 2021-05-01 DIAGNOSIS — M8580 Other specified disorders of bone density and structure, unspecified site: Secondary | ICD-10-CM | POA: Diagnosis not present

## 2021-05-01 DIAGNOSIS — I10 Essential (primary) hypertension: Secondary | ICD-10-CM | POA: Diagnosis not present

## 2021-05-01 DIAGNOSIS — Z853 Personal history of malignant neoplasm of breast: Secondary | ICD-10-CM | POA: Diagnosis not present

## 2021-05-01 DIAGNOSIS — E785 Hyperlipidemia, unspecified: Secondary | ICD-10-CM

## 2021-05-01 NOTE — Progress Notes (Signed)
Patient ID: Erin [name], female   DOB: 23-Feb-1931, 88 y.o.   MRN: [id number]   Subjective:    Patient ID: Erin [name], female    DOB: Jan 26, 1931, 88 y.o.   MRN: [id number]  This visit occurred during the SARS-CoV-2 public health emergency.  Safety protocols were in place, including screening questions prior to the visit, additional usage of staff PPE, and extensive cleaning of exam room while observing appropriate contact time as indicated for disinfecting solutions.   Patient here for a scheduled follow up.   Chief Complaint  Patient presents with   Hyperlipidemia   Hypertension   .   HPI Reports she is doing relatively well.  Still walking.  No chest pain or increased sob reported.  No cough or congestion.  No acid reflux or abdominal pain reported.  Bowels moving.  No acid reflux.  No abdominal pain or bowel change.  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  Was seeing Dr Rogue Bussing.  Not on active treatment.  Has been released.     Past Medical History:  Diagnosis Date   A-fib [facility])    Breast cancer ([place])    Cancer ([place])    melanoma - arm left   Chest pain 07/13/2016   History of colon polyps    Hypercholesterolemia    Hypertension    Past Surgical History:  Procedure Laterality Date   APPENDECTOMY  1946   BREAST BIOPSY Left 06/04/2020   [place] bx, vision marker, path pending   BREAST LUMPECTOMY WITH SENTINEL LYMPH NODE BIOPSY Left 06/24/2020   Procedure: BREAST LUMPECTOMY WITH SENTINEL LYMPH NODE BIOPSY;  Surgeon: Robert Bellow, MD;  Location: [facility];  Service: General;  Laterality: Left;   CARDIOVERSION N/A 07/30/2020   Procedure: CARDIOVERSION;  Surgeon: Nelva Bush, MD;  Location: [facility];  Service: Cardiovascular;  Laterality: N/A;   CARDIOVERSION N/A 09/03/2020   Procedure: CARDIOVERSION;  Surgeon: Nelva Bush, MD;  Location: [facility];  Service: Cardiovascular;  Laterality: N/A;   CATARACT EXTRACTION Bilateral    DILATION AND CURETTAGE OF UTERUS      LAMINECTOMY  2000   release of foraminal stenosis  2003   Family History  Problem Relation Age of Onset   Hypertension Mother    Hypertension Sister    Multiple sclerosis Sister        died 11   Heart disease Brother        S/P CABG   Breast cancer Maternal Aunt    Social History   Socioeconomic History   Marital status: Widowed    Spouse name: Not on file   Number of children: 4   Years of education: Not on file   Highest education level: Not on file  Occupational History   Occupation: retired  Tobacco Use   Smoking status: Former   Smokeless tobacco: Never  Scientific laboratory technician Use: Never used  Substance and Sexual Activity   Alcohol use: Yes    Alcohol/week: 0.0 standard drinks    Comment: occassionally   Drug use: No   Sexual activity: Never  Other Topics Concern   Not on file  Social History Narrative   Not on file   Social Determinants of Health   Financial Resource Strain: Not on file  Food Insecurity: Not on file  Transportation Needs: Not on file  Physical Activity: Not on file  Stress: Not on file  Social Connections: Not on file     Review of  Systems  Constitutional:  Negative for appetite change and unexpected weight change.  HENT:  Negative for congestion and sinus pressure.   Respiratory:  Negative for cough, chest tightness and shortness of breath.   Cardiovascular:  Negative for chest pain, palpitations and leg swelling.  Gastrointestinal:  Negative for abdominal pain, diarrhea, nausea and vomiting.  Genitourinary:  Negative for difficulty urinating and dysuria.  Musculoskeletal:  Negative for joint swelling and myalgias.  Skin:  Negative for color change and rash.  Neurological:  Negative for dizziness, light-headedness and headaches.  Psychiatric/Behavioral:  Negative for agitation and dysphoric mood.       Objective:     BP 130/72    Pulse 60    Temp 97.9 F (36.6 C)    Resp 16    Ht 5' (1.524 m)    Wt 129 lb 12.8 oz (58.9 kg)     SpO2 97%    BMI 25.35 kg/m  Wt Readings from Last 3 Encounters:  05/01/21 129 lb 12.8 oz (58.9 kg)  03/31/21 130 lb (59 kg)  03/13/21 131 lb (59.4 kg)    Physical Exam Vitals reviewed.  Constitutional:      General: She is not in acute distress.    Appearance: Normal appearance.  HENT:     Head: Normocephalic and atraumatic.     Right Ear: External ear normal.     Left Ear: External ear normal.  Eyes:     General: No scleral icterus.       Right eye: No discharge.        Left eye: No discharge.     Conjunctiva/sclera: Conjunctivae normal.  Neck:     Thyroid: No thyromegaly.  Cardiovascular:     Rate and Rhythm: Normal rate and regular rhythm.  Pulmonary:     Effort: No respiratory distress.     Breath sounds: Normal breath sounds. No wheezing.  Abdominal:     General: Bowel sounds are normal.     Palpations: Abdomen is soft.     Tenderness: There is no abdominal tenderness.  Musculoskeletal:        General: No swelling or tenderness.     Cervical back: Neck supple. No tenderness.  Lymphadenopathy:     Cervical: No cervical adenopathy.  Skin:    Findings: No erythema or rash.  Neurological:     Mental Status: She is alert.  Psychiatric:        Mood and Affect: Mood normal.        Behavior: Behavior normal.     Outpatient Encounter Medications as of 05/01/2021  Medication Sig   acetaminophen (TYLENOL) 325 MG tablet Take 650 mg by mouth every 6 (six) hours as needed for moderate pain or headache.   amiodarone (PACERONE) 200 MG tablet Take 1 tablet (200 mg total) by mouth daily.   apixaban (ELIQUIS) 2.5 MG TABS tablet Take 1 tablet (2.5 mg total) by mouth 2 (two) times daily.   benazepril (LOTENSIN) 40 MG tablet Take 1 tablet (40 mg total) by mouth daily.   Calcium Carbonate-Vitamin D 600-400 MG-UNIT tablet Take 1 tablet by mouth in the morning.   felodipine (PLENDIL) 2.5 MG 24 hr tablet Take 1 tablet (2.5 mg total) by mouth daily.   furosemide (LASIX) 20 MG  tablet Take 1 tablet (20 mg total) by mouth daily as needed.   Multiple Vitamin (MULTIVITAMIN WITH MINERALS) TABS tablet Take 1 tablet by mouth in the morning.   Omega-3 Fatty Acids (FISH OIL) 1000  MG CAPS Take 1,000 mg by mouth every evening.   rosuvastatin (CRESTOR) 5 MG tablet Take 1 tablet (5 mg total) by mouth daily.   No facility-administered encounter medications on file as of 05/01/2021.     Lab Results  Component Value Date   WBC 5.3 03/31/2021   HGB 14.4 03/31/2021   HCT 43.7 03/31/2021   PLT 188 03/31/2021   GLUCOSE 95 03/31/2021   CHOL 183 12/24/2020   TRIG 75.0 12/24/2020   HDL 94.40 12/24/2020   LDLCALC 74 12/24/2020   ALT 30 03/31/2021   AST 30 03/31/2021   NA 135 03/31/2021   K 4.7 03/31/2021   CL 99 03/31/2021   CREATININE 1.05 (H) 03/31/2021   BUN 21 03/31/2021   CO2 30 03/31/2021   TSH 1.010 01/31/2021   HGBA1C 5.7 12/24/2020       Assessment & Plan:   Problem List Items Addressed This Visit     Atrial fibrillation [facility]: [facility] W)    S/p cardioversion.  Appears to be in SR today.  Continues on eliquis, amiodarone and metoprolol.        Essential hypertension    On metoprolol 12.51m q day. Continue benazepril.  Follow pressures.  Follow metabolic panel.      Relevant Orders   TSH   History of breast cancer    Was being followed by Dr BRogue Bussing  On no active treatment.  Has been released.  Follow.       Hyperglycemia    Low carb diet and exercise.  Follow met b and a1c.       Relevant Orders   Hemoglobin A1c   Hyperlipidemia - Primary    Continue crestor.  Low cholesterol diet and exercise.  Follow lipid panel and liver function tests.       Relevant Orders   Basic metabolic panel   Lipid panel   Hepatic function panel   Osteopenia    Calcium, vitamin D and weight bearing exercise.          CEinar Pheasant MD

## 2021-05-04 ENCOUNTER — Encounter: Payer: Self-pay | Admitting: Internal Medicine

## 2021-05-04 DIAGNOSIS — M8580 Other specified disorders of bone density and structure, unspecified site: Secondary | ICD-10-CM | POA: Insufficient documentation

## 2021-05-04 DIAGNOSIS — Z853 Personal history of malignant neoplasm of breast: Secondary | ICD-10-CM | POA: Insufficient documentation

## 2021-05-04 NOTE — Assessment & Plan Note (Signed)
Continue crestor.  Low cholesterol diet and exercise. Follow lipid panel and liver function tests.   

## 2021-05-04 NOTE — Assessment & Plan Note (Signed)
Calcium, vitamin D and weight bearing exercise.

## 2021-05-04 NOTE — Assessment & Plan Note (Signed)
On metoprolol 12.5mg q day. Continue benazepril.  Follow pressures.  Follow metabolic panel. 

## 2021-05-04 NOTE — Assessment & Plan Note (Signed)
S/p cardioversion.  Appears to be in SR today.  Continues on eliquis, amiodarone and metoprolol.

## 2021-05-04 NOTE — Assessment & Plan Note (Signed)
Low carb diet and exercise.  Follow met b and a1c.

## 2021-05-04 NOTE — Assessment & Plan Note (Signed)
Was being followed by Dr Rogue Bussing.  On no active treatment.  Has been released.  Follow.

## 2021-05-06 ENCOUNTER — Ambulatory Visit (INDEPENDENT_AMBULATORY_CARE_PROVIDER_SITE_OTHER): Payer: PPO

## 2021-05-06 VITALS — BP 123/62 | HR 51 | Temp 97.6°F | Ht 60.0 in | Wt 128.0 lb

## 2021-05-06 DIAGNOSIS — Z0000 Encounter for general adult medical examination without abnormal findings: Secondary | ICD-10-CM | POA: Diagnosis not present

## 2021-05-06 NOTE — Patient Instructions (Addendum)
Erin [name] , Thank you for taking time to come for your Medicare Wellness Visit. I appreciate your ongoing commitment to your health goals. Please review the following plan we discussed and let me know if I can assist you in the future.   These are the goals we discussed:  Goals       Patient Stated     I want to start playing "Bridge" game again (pt-stated)      Brain stimulating activities        This is a list of the screening recommended for you and due dates:  Health Maintenance  Topic Date Due   Mammogram  11/24/2020   Tetanus Vaccine  06/01/2022   Pneumonia Vaccine  Completed   Flu Shot  Completed   DEXA scan (bone density measurement)  Completed   COVID-19 Vaccine  Completed   Zoster (Shingles) Vaccine  Completed   HPV Vaccine  Aged Out    Advanced directives: on file  Conditions/risks identified: none new  Follow up in one year for your annual wellness visit    Preventive Care 65 Years and Older, Female Preventive care refers to lifestyle choices and visits with your health care provider that can promote health and wellness. What does preventive care include? A yearly physical exam. This is also called an annual well check. Dental exams once or twice a year. Routine eye exams. Ask your health care provider how often you should have your eyes checked. Personal lifestyle choices, including: Daily care of your teeth and gums. Regular physical activity. Eating a healthy diet. Avoiding tobacco and drug use. Limiting alcohol use. Practicing safe sex. Taking low-dose aspirin every day. Taking vitamin and mineral supplements as recommended by your health care provider. What happens during an annual well check? The services and screenings done by your health care provider during your annual well check will depend on your age, overall health, lifestyle risk factors, and family history of disease. Counseling  Your health care provider may ask you questions about  your: Alcohol use. Tobacco use. Drug use. Emotional well-being. Home and relationship well-being. Sexual activity. Eating habits. History of falls. Memory and ability to understand (cognition). Work and work Statistician. Reproductive health. Screening  You may have the following tests or measurements: Height, weight, and BMI. Blood pressure. Lipid and cholesterol levels. These may be checked every 5 years, or more frequently if you are over 30 years old. Skin check. Lung cancer screening. You may have this screening every year starting at age 5 if you have a 30-pack-year history of smoking and currently smoke or have quit within the past 15 years. Fecal occult blood test (FOBT) of the stool. You may have this test every year starting at age 10. Flexible sigmoidoscopy or colonoscopy. You may have a sigmoidoscopy every 5 years or a colonoscopy every 10 years starting at age 63. Hepatitis C blood test. Hepatitis B blood test. Sexually transmitted disease (STD) testing. Diabetes screening. This is done by checking your blood sugar (glucose) after you have not eaten for a while (fasting). You may have this done every 1-3 years. Bone density scan. This is done to screen for osteoporosis. You may have this done starting at age 64. Mammogram. This may be done every 1-2 years. Talk to your health care provider about how often you should have regular mammograms. Talk with your health care provider about your test results, treatment options, and if necessary, the need for more tests. Vaccines  Your health care  provider may recommend certain vaccines, such as: Influenza vaccine. This is recommended every year. Tetanus, diphtheria, and acellular pertussis (Tdap, Td) vaccine. You may need a Td booster every 10 years. Zoster vaccine. You may need this after age 69. Pneumococcal 13-valent conjugate (PCV13) vaccine. One dose is recommended after age 44. Pneumococcal polysaccharide (PPSV23) vaccine.  One dose is recommended after age 69. Talk to your health care provider about which screenings and vaccines you need and how often you need them. This information is not intended to replace advice given to you by your health care provider. Make sure you discuss any questions you have with your health care provider. Document Released: 03/29/2015 Document Revised: 11/20/2015 Document Reviewed: 01/01/2015 Elsevier Interactive Patient Education  2017 [place] Prevention in the Home Falls can cause injuries. They can happen to people of all ages. There are many things you can do to make your home safe and to help prevent falls. What can I do on the outside of my home? Regularly fix the edges of walkways and driveways and fix any cracks. Remove anything that might make you trip as you walk through a door, such as a raised step or threshold. Trim any bushes or trees on the path to your home. Use bright outdoor lighting. Clear any walking paths of anything that might make someone trip, such as rocks or tools. Regularly check to see if handrails are loose or broken. Make sure that both sides of any steps have handrails. Any raised decks and porches should have guardrails on the edges. Have any leaves, snow, or ice cleared regularly. Use sand or salt on walking paths during winter. Clean up any spills in your garage right away. This includes oil or grease spills. What can I do in the bathroom? Use night lights. Install grab bars by the toilet and in the tub and shower. Do not use towel bars as grab bars. Use non-skid mats or decals in the tub or shower. If you need to sit down in the shower, use a plastic, non-slip stool. Keep the floor dry. Clean up any water that spills on the floor as soon as it happens. Remove soap buildup in the tub or shower regularly. Attach bath mats securely with double-sided non-slip rug tape. Do not have throw rugs and other things on the floor that can make  you trip. What can I do in the bedroom? Use night lights. Make sure that you have a light by your bed that is easy to reach. Do not use any sheets or blankets that are too big for your bed. They should not hang down onto the floor. Have a firm chair that has side arms. You can use this for support while you get dressed. Do not have throw rugs and other things on the floor that can make you trip. What can I do in the kitchen? Clean up any spills right away. Avoid walking on wet floors. Keep items that you use a lot in easy-to-reach places. If you need to reach something above you, use a strong step stool that has a grab bar. Keep electrical cords out of the way. Do not use floor polish or wax that makes floors slippery. If you must use wax, use non-skid floor wax. Do not have throw rugs and other things on the floor that can make you trip. What can I do with my stairs? Do not leave any items on the stairs. Make sure that there are handrails on both  sides of the stairs and use them. Fix handrails that are broken or loose. Make sure that handrails are as long as the stairways. Check any carpeting to make sure that it is firmly attached to the stairs. Fix any carpet that is loose or worn. Avoid having throw rugs at the top or bottom of the stairs. If you do have throw rugs, attach them to the floor with carpet tape. Make sure that you have a light switch at the top of the stairs and the bottom of the stairs. If you do not have them, ask someone to add them for you. What else can I do to help prevent falls? Wear shoes that: Do not have high heels. Have rubber bottoms. Are comfortable and fit you well. Are closed at the toe. Do not wear sandals. If you use a stepladder: Make sure that it is fully opened. Do not climb a closed stepladder. Make sure that both sides of the stepladder are locked into place. Ask someone to hold it for you, if possible. Clearly mark and make sure that you can  see: Any grab bars or handrails. First and last steps. Where the edge of each step is. Use tools that help you move around (mobility aids) if they are needed. These include: Canes. Walkers. Scooters. Crutches. Turn on the lights when you go into a dark area. Replace any light bulbs as soon as they burn out. Set up your furniture so you have a clear path. Avoid moving your furniture around. If any of your floors are uneven, fix them. If there are any pets around you, be aware of where they are. Review your medicines with your doctor. Some medicines can make you feel dizzy. This can increase your chance of falling. Ask your doctor what other things that you can do to help prevent falls. This information is not intended to replace advice given to you by your health care provider. Make sure you discuss any questions you have with your health care provider. Document Released: 12/27/2008 Document Revised: 08/08/2015 Document Reviewed: 04/06/2014 Elsevier Interactive Patient Education  2017 [employer].

## 2021-05-06 NOTE — Progress Notes (Signed)
Subjective:   Erin [name] is a 88 y.o. female who presents for Medicare Annual (Subsequent) preventive examination.  Review of Systems    No ROS.  Medicare Wellness Virtual Visit.  Visual/audio telehealth visit, UTA vital signs.   See social history for additional risk factors.   Cardiac Risk Factors include: advanced age (>17men, >40 women)     Objective:    Today's Vitals   05/06/21 1402  BP: 123/62  Pulse: (!) 51  Temp: 97.6 F (36.4 C)  Weight: 128 lb (58.1 kg)   Body mass index is 25 kg/m.  Advanced Directives 05/06/2021 03/31/2021 03/13/2021 09/09/2020 07/10/2020 06/24/2020 06/18/2020  Does Patient Have a Medical Advance Directive? Yes No No Yes Yes Yes Yes  Type of Paramedic of [place];Living will - - [place];Living will [place];Living will [place];Living will [place];Living will  Does patient want to make changes to medical advance directive? No - Patient declined - - - - No - Patient declined No - Patient declined  Copy of [place] in Chart? Yes - validated most recent copy scanned in chart (See row information) - - - No - copy requested No - copy requested No - copy requested  Would patient like information on creating a medical advance directive? - No - Patient declined No - Patient declined - - - -   Current Medications (verified) Outpatient Encounter Medications as of 05/06/2021  Medication Sig   acetaminophen (TYLENOL) 325 MG tablet Take 650 mg by mouth every 6 (six) hours as needed for moderate pain or headache.   amiodarone (PACERONE) 200 MG tablet Take 1 tablet (200 mg total) by mouth daily.   apixaban (ELIQUIS) 2.5 MG TABS tablet Take 1 tablet (2.5 mg total) by mouth 2 (two) times daily.   benazepril (LOTENSIN) 40 MG tablet Take 1 tablet (40 mg total) by mouth daily.   Calcium Carbonate-Vitamin D 600-400 MG-UNIT tablet Take 1 tablet by  mouth in the morning.   felodipine (PLENDIL) 2.5 MG 24 hr tablet Take 1 tablet (2.5 mg total) by mouth daily.   furosemide (LASIX) 20 MG tablet Take 1 tablet (20 mg total) by mouth daily as needed.   Multiple Vitamin (MULTIVITAMIN WITH MINERALS) TABS tablet Take 1 tablet by mouth in the morning.   Omega-3 Fatty Acids (FISH OIL) 1000 MG CAPS Take 1,000 mg by mouth every evening.   rosuvastatin (CRESTOR) 5 MG tablet Take 1 tablet (5 mg total) by mouth daily.   No facility-administered encounter medications on file as of 05/06/2021.    Allergies (verified) Erythromycin and Minocin [minocycline hcl]   History: Past Medical History:  Diagnosis Date   A-fib ([place])    Breast cancer ([place])    Cancer ([place])    melanoma - arm left   Chest pain 07/13/2016   History of colon polyps    Hypercholesterolemia    Hypertension    Past Surgical History:  Procedure Laterality Date   APPENDECTOMY  1946   BREAST BIOPSY Left 06/04/2020   [place] bx, vision marker, path pending   BREAST LUMPECTOMY WITH SENTINEL LYMPH NODE BIOPSY Left 06/24/2020   Procedure: BREAST LUMPECTOMY WITH SENTINEL LYMPH NODE BIOPSY;  Surgeon: Robert Bellow, MD;  Location: [facility];  Service: General;  Laterality: Left;   CARDIOVERSION N/A 07/30/2020   Procedure: CARDIOVERSION;  Surgeon: Nelva Bush, MD;  Location: [facility];  Service: Cardiovascular;  Laterality: N/A;  CARDIOVERSION N/A 09/03/2020   Procedure: CARDIOVERSION;  Surgeon: Nelva Bush, MD;  Location: [facility];  Service: Cardiovascular;  Laterality: N/A;   CATARACT EXTRACTION Bilateral    DILATION AND CURETTAGE OF UTERUS     LAMINECTOMY  2000   release of foraminal stenosis  2003   Family History  Problem Relation Age of Onset   Hypertension Mother    Hypertension Sister    Multiple sclerosis Sister        died 1   Heart disease Brother        S/P CABG   Breast cancer Maternal Aunt    Social History   Socioeconomic History   Marital status:  Widowed    Spouse name: Not on file   Number of children: 4   Years of education: Not on file   Highest education level: Not on file  Occupational History   Occupation: retired  Tobacco Use   Smoking status: Former   Smokeless tobacco: Never  Scientific laboratory technician Use: Never used  Substance and Sexual Activity   Alcohol use: Yes    Alcohol/week: 0.0 standard drinks    Comment: occassionally   Drug use: No   Sexual activity: Never  Other Topics Concern   Not on file  Social History Narrative   Not on file   Social Determinants of Health   Financial Resource Strain: Low Risk    Difficulty of Paying Living Expenses: Not hard at all  Food Insecurity: No Food Insecurity   Worried About Charity fundraiser in the Last Year: Never true   [place] in the Last Year: Never true  Transportation Needs: No Transportation Needs   Lack of Transportation (Medical): No   Lack of Transportation (Non-Medical): No  Physical Activity: Sufficiently Active   Days of Exercise per Week: 5 days   Minutes of Exercise per Session: 30 min  Stress: No Stress Concern Present   Feeling of Stress : Not at all  Social Connections: Moderately Integrated   Frequency of Communication with Friends and Family: More than three times a week   Frequency of Social Gatherings with Friends and Family: Twice a week   Attends Religious Services: More than 4 times per year   Active Member of [employer] or Organizations: Yes   Attends Archivist Meetings: Not on file   Marital Status: Widowed    Tobacco Counseling Counseling given: Not Answered   Clinical Intake:  Pre-visit preparation completed: Yes        Diabetes: No  How often do you need to have someone help you when you read instructions, pamphlets, or other written materials from your doctor or pharmacy?: 1 - Never    Interpreter Needed?: No    Activities of Daily Living In your present state of health, do you have any difficulty  performing the following activities: 05/06/2021 09/03/2020  Hearing? N N  Vision? N N  Difficulty concentrating or making decisions? N N  Walking or climbing stairs? Y N  Comment Paces self when walking. Cane or walking stick in use. -  Dressing or bathing? N N  Doing errands, shopping? N -  Preparing Food and eating ? N -  Using the Toilet? N -  In the past six months, have you accidently leaked urine? N -  Do you have problems with loss of bowel control? N -  Managing your Medications? N -  Managing your Finances? N -  Housekeeping or managing  your Housekeeping? N -  Some recent data might be hidden    Patient Care Team: Einar Pheasant, MD as PCP - General (Internal Medicine) End, Harrell Gave, MD as PCP - Cardiology (Cardiology) Theodore Demark, RN as Oncology Nurse Navigator Noreene Filbert, MD as Consulting Physician (Radiation Oncology) Cammie Sickle, MD as Consulting Physician (Oncology) Bary Castilla Forest Gleason, MD as Consulting Physician (General Surgery) Emmaline Kluver., MD as Consulting Physician (Rheumatology)  Indicate any recent Medical Services you may have received from other than [facility] providers in the past year (date may be approximate).     Assessment:   This is a routine wellness examination for Erin [name].  Virtual Visit via Telephone Note  I connected with  [name] on 05/06/21 at  2:00 PM EST by telephone and verified that I am speaking with the correct person using two identifiers.  Persons participating in the virtual visit: patient/Nurse Health Advisor   I discussed the limitations, risks, security and privacy concerns of performing an evaluation and management service by telephone and the availability of in person appointments. The patient expressed understanding and agreed to proceed.  Interactive audio and video telecommunications were attempted between this nurse and patient, however failed, due to patient having technical difficulties OR  patient did not have access to video capability.  We continued and completed visit with audio only.  Some vital signs may be absent or patient reported.   Hearing/Vision screen Hearing Screening - Comments:: Hearing aid, bilateral  Vision Screening - Comments:: Wears corrective lenses when reading  Cataract extraction, bilateral  They have seen their ophthalmologist in the last 12 months.   Dietary issues and exercise activities discussed: Current Exercise Habits: Home exercise routine, Intensity: Mild Healthy diet Low salt intake Good water intake   Goals Addressed               This Visit's Progress     Patient Stated     I want to start playing "Bridge" game again (pt-stated)   On track     Brain stimulating activities       Depression Screen PHQ 2/9 Scores 05/06/2021 05/01/2021 09/17/2020 04/30/2020 04/28/2019 04/26/2018 04/23/2017  PHQ - 2 Score 0 0 0 0 0 0 0    Fall Risk Fall Risk  05/06/2021 05/01/2021 09/17/2020 04/30/2020 04/28/2019  Falls in the past year? 0 0 0 0 0  Number falls in past yr: - 0 0 0 -  Injury with Fall? - 0 0 0 -  Risk for fall due to : - No Fall Risks - - -  Follow up Falls evaluation completed Falls evaluation completed Falls evaluation completed Falls evaluation completed Falls evaluation completed    [place]: Home free of loose throw rugs in walkways, pet beds, electrical cords, etc? Yes  Adequate lighting in your home to reduce risk of falls? Yes   ASSISTIVE DEVICES UTILIZED TO PREVENT FALLS: Watch life alert? Yes  Use of a cane, walker or w/c? Yes  Grab bars in the bathroom? Yes   TIMED UP AND GO: Was the test performed? No .   Cognitive Function: Patient is alert and oriented x3. Enjoys playing bridge and socializing with friends.  MMSE - Mini Mental State Exam 04/23/2015  Orientation to time 5  Orientation to Place 5  Registration 3  Attention/ Calculation 5  Recall 3  Language- name 2 objects 2   Language- repeat 1  Language- follow 3 step command  3  Language- read & follow direction 1  Write a sentence 1  Copy design 1  Total score 30     6CIT Screen 04/30/2020 04/28/2019 04/26/2018 04/23/2017 04/22/2016  What Year? 0 points 0 points 0 points 0 points 0 points  What month? 0 points 0 points 0 points 0 points 0 points  What time? 0 points 0 points 0 points 0 points 0 points  Count back from 20 0 points 0 points 0 points 0 points 0 points  Months in reverse 0 points 0 points 0 points 0 points 0 points  Repeat phrase - 0 points 0 points 0 points 0 points  Total Score - 0 0 0 0    Immunizations Immunization History  Administered Date(s) Administered   Fluad Quad(high Dose 65+) 11/25/2018   Influenza Split 12/05/2013   Influenza, High Dose Seasonal PF 11/24/2016, 12/02/2017, 12/16/2020   Influenza,inj,Quad PF,6+ Mos 11/30/2012   Influenza-Unspecified 11/14/2013, 12/11/2014, 12/06/2015   PFIZER(Purple Top)SARS-COV-2 Vaccination 04/06/2019, 04/27/2019, 01/04/2020, 07/16/2020   Pfizer Covid-19 Vaccine Bivalent Booster 42yrs & up 01/14/2021   Pneumococcal Conjugate-13 10/24/2014   Pneumococcal Polysaccharide-23 11/13/2015   Tdap 05/31/2012   Zoster Recombinat (Shingrix) 12/23/2016, 03/26/2017   Zoster, Live 02/14/2011   Screening Tests Health Maintenance  Topic Date Due   MAMMOGRAM  11/24/2020   TETANUS/TDAP  06/01/2022   Pneumonia Vaccine 63+ Years old  Completed   INFLUENZA VACCINE  Completed   DEXA SCAN  Completed   COVID-19 Vaccine  Completed   Zoster Vaccines- Shingrix  Completed   HPV VACCINES  Aged Out   Health Maintenance Health Maintenance Due  Topic Date Due   MAMMOGRAM  11/24/2020   Mammogram-scheduled  Lung Cancer Screening: (Low Dose CT Chest recommended if Age 48-80 years, 30 pack-year currently smoking OR have quit w/in 15years.) does not qualify.   Hepatitis C Screening: does not qualify.  Vision Screening: Recommended annual ophthalmology exams for  early detection of glaucoma and other disorders of the eye.  Dental Screening: Recommended annual dental exams for proper oral hygiene  Community Resource Referral / Chronic Care Management: CRR required this visit?  No   CCM required this visit?  No      Plan:   Keep all routine maintenance appointments.   I have personally reviewed and noted the following in the patients chart:   Medical and social history Use of alcohol, tobacco or illicit drugs  Current medications and supplements including opioid prescriptions.  Functional ability and status Nutritional status Physical activity Advanced directives List of other physicians Hospitalizations, surgeries, and ER visits in previous 12 months Vitals Screenings to include cognitive, depression, and falls Referrals and appointments  In addition, I have reviewed and discussed with patient certain preventive protocols, quality metrics, and best practice recommendations. A written personalized care plan for preventive services as well as general preventive health recommendations were provided to patient via mychart.     Varney Biles, LPN   [id number]/17/5102

## 2021-05-27 ENCOUNTER — Other Ambulatory Visit: Payer: Self-pay

## 2021-05-27 ENCOUNTER — Ambulatory Visit
Admission: RE | Admit: 2021-05-27 | Discharge: 2021-05-27 | Disposition: A | Discharge status: Home / Self Care | Payer: PPO | Source: Ambulatory Visit | Attending: General Surgery | Admitting: General Surgery

## 2021-05-27 DIAGNOSIS — D0512 Intraductal carcinoma in situ of left breast: Secondary | ICD-10-CM | POA: Insufficient documentation

## 2021-05-27 DIAGNOSIS — R922 Inconclusive mammogram: Secondary | ICD-10-CM | POA: Diagnosis not present

## 2021-05-27 HISTORY — DX: Personal history of irradiation: Z92.3

## 2021-06-03 DIAGNOSIS — Z170 Estrogen receptor positive status [ER+]: Secondary | ICD-10-CM | POA: Diagnosis not present

## 2021-06-03 DIAGNOSIS — C50412 Malignant neoplasm of upper-outer quadrant of left female breast: Secondary | ICD-10-CM | POA: Diagnosis not present

## 2021-06-17 ENCOUNTER — Other Ambulatory Visit: Payer: Self-pay | Admitting: Internal Medicine

## 2021-07-10 ENCOUNTER — Other Ambulatory Visit: Payer: Self-pay | Admitting: Internal Medicine

## 2021-07-25 ENCOUNTER — Other Ambulatory Visit (INDEPENDENT_AMBULATORY_CARE_PROVIDER_SITE_OTHER): Payer: PPO

## 2021-07-25 DIAGNOSIS — I10 Essential (primary) hypertension: Secondary | ICD-10-CM

## 2021-07-25 DIAGNOSIS — R739 Hyperglycemia, unspecified: Secondary | ICD-10-CM

## 2021-07-25 DIAGNOSIS — E785 Hyperlipidemia, unspecified: Secondary | ICD-10-CM | POA: Diagnosis not present

## 2021-07-25 LAB — HEPATIC FUNCTION PANEL
ALT: 33 U/L (ref 0–35)
AST: 28 U/L (ref 0–37)
Albumin: 4.2 g/dL (ref 3.5–5.2)
Alkaline Phosphatase: 77 U/L (ref 39–117)
Bilirubin, Direct: 0.2 mg/dL (ref 0.0–0.3)
Total Bilirubin: 0.8 mg/dL (ref 0.2–1.2)
Total Protein: 6.7 g/dL (ref 6.0–8.3)

## 2021-07-25 LAB — LIPID PANEL
Cholesterol: 181 mg/dL (ref 0–200)
HDL: 96.6 mg/dL (ref >39.00)
LDL Cholesterol: 73 mg/dL (ref 0–99)
NonHDL: 84.11
Total CHOL/HDL Ratio: 2
Triglycerides: 58 mg/dL (ref 0.0–149.0)
VLDL: 11.6 mg/dL (ref 0.0–40.0)

## 2021-07-25 LAB — BASIC METABOLIC PANEL
BUN: 22 mg/dL (ref 6–23)
CO2: 30 mEq/L (ref 19–32)
Calcium: 10.1 mg/dL (ref 8.4–10.5)
Chloride: 101 mEq/L (ref 96–112)
Creatinine, Ser: 1.24 mg/dL — ABNORMAL HIGH (ref 0.40–1.20)
GFR: 38.63 mL/min — ABNORMAL LOW (ref >60.00)
Glucose, Bld: 92 mg/dL (ref 70–99)
Potassium: 4 mEq/L (ref 3.5–5.1)
Sodium: 139 mEq/L (ref 135–145)

## 2021-07-25 LAB — TSH: TSH: 1.29 u[IU]/mL (ref 0.35–5.50)

## 2021-07-25 LAB — HEMOGLOBIN A1C: Hgb A1c MFr Bld: 5.6 % (ref 4.6–6.5)

## 2021-07-30 ENCOUNTER — Ambulatory Visit (INDEPENDENT_AMBULATORY_CARE_PROVIDER_SITE_OTHER): Payer: PPO | Admitting: Internal Medicine

## 2021-07-30 ENCOUNTER — Encounter: Payer: Self-pay | Admitting: Internal Medicine

## 2021-07-30 VITALS — BP 150/80 | HR 67 | Temp 98.0°F | Resp 14 | Ht 60.0 in | Wt 128.3 lb

## 2021-07-30 DIAGNOSIS — I4819 Other persistent atrial fibrillation: Secondary | ICD-10-CM | POA: Diagnosis not present

## 2021-07-30 DIAGNOSIS — E785 Hyperlipidemia, unspecified: Secondary | ICD-10-CM | POA: Diagnosis not present

## 2021-07-30 DIAGNOSIS — Z86010 Personal history of colonic polyps: Secondary | ICD-10-CM | POA: Diagnosis not present

## 2021-07-30 DIAGNOSIS — I10 Essential (primary) hypertension: Secondary | ICD-10-CM

## 2021-07-30 DIAGNOSIS — R928 Other abnormal and inconclusive findings on diagnostic imaging of breast: Secondary | ICD-10-CM

## 2021-07-30 DIAGNOSIS — R944 Abnormal results of kidney function studies: Secondary | ICD-10-CM | POA: Diagnosis not present

## 2021-07-30 DIAGNOSIS — Z0000 Encounter for general adult medical examination without abnormal findings: Secondary | ICD-10-CM | POA: Diagnosis not present

## 2021-07-30 DIAGNOSIS — R7989 Other specified abnormal findings of blood chemistry: Secondary | ICD-10-CM

## 2021-07-30 DIAGNOSIS — Z853 Personal history of malignant neoplasm of breast: Secondary | ICD-10-CM | POA: Diagnosis not present

## 2021-07-30 DIAGNOSIS — R739 Hyperglycemia, unspecified: Secondary | ICD-10-CM | POA: Diagnosis not present

## 2021-07-30 LAB — URINALYSIS, ROUTINE W REFLEX MICROSCOPIC
Bilirubin Urine: NEGATIVE
Hgb urine dipstick: NEGATIVE
Ketones, ur: NEGATIVE
Leukocytes,Ua: NEGATIVE
Nitrite: NEGATIVE
Specific Gravity, Urine: <=1.005 — AB (ref 1.000–1.030)
Total Protein, Urine: NEGATIVE
Urine Glucose: NEGATIVE
Urobilinogen, UA: 0.2 (ref 0.0–1.0)
pH: 7 (ref 5.0–8.0)

## 2021-07-30 LAB — BASIC METABOLIC PANEL
BUN: 18 mg/dL (ref 6–23)
CO2: 29 mEq/L (ref 19–32)
Calcium: 10.1 mg/dL (ref 8.4–10.5)
Chloride: 101 mEq/L (ref 96–112)
Creatinine, Ser: 1.17 mg/dL (ref 0.40–1.20)
GFR: 41.41 mL/min — ABNORMAL LOW (ref >60.00)
Glucose, Bld: 88 mg/dL (ref 70–99)
Potassium: 4 mEq/L (ref 3.5–5.1)
Sodium: 140 mEq/L (ref 135–145)

## 2021-07-30 NOTE — Progress Notes (Signed)
Patient ID: Erin [name], female   DOB: 1930/07/13, 88 y.o.   MRN: [id number]   Subjective:    Patient ID: Erin [name], female    DOB: 11-28-1930, 88 y.o.   MRN: [id number]   Patient here for her physical exam.   Chief Complaint  Patient presents with   Annual Exam    CPE   .   HPI Doing relatively well.  Is walking 1.45mles per day every morning.  No chest pain.  Has noticed decreased heart rate.  Notices heart rate in the 30s-40s range at night - notices on her apple watch.  No dizziness or light headedness.  No syncope or near syncope.  No acid reflux or abdominal pain.  Bowels moving.     Past Medical History:  Diagnosis Date   A-fib ([facility]    [place]    melanoma - arm left   Chest pain 07/13/2016   History of colon polyps    Hypercholesterolemia    Hypertension    Personal history of radiation therapy    Past Surgical History:  Procedure Laterality Date   APPENDECTOMY  1946   BREAST BIOPSY Left 06/04/2020   uKoreabx, vision marker, positive   BREAST LUMPECTOMY Left 06/24/2020   BREAST LUMPECTOMY WITH SENTINEL LYMPH NODE BIOPSY Left 06/24/2020   Procedure: BREAST LUMPECTOMY WITH SENTINEL LYMPH NODE BIOPSY;  Surgeon: BRobert Bellow MD;  [facility];  Service: General;  Laterality: Left;   CARDIOVERSION N/A 07/30/2020   Procedure: CARDIOVERSION;  Surgeon: ENelva Bush MD;  Location: [facility];  Service: Cardiovascular;  Laterality: N/A;   CARDIOVERSION N/A 09/03/2020   Procedure: CARDIOVERSION;  Surgeon: ENelva Bush MD;  Location: [facility];  Service: Cardiovascular;  Laterality: N/A;   CATARACT EXTRACTION Bilateral    DILATION AND CURETTAGE OF UTERUS     LAMINECTOMY  2000   release of foraminal stenosis  2003   Family History  Problem Relation Age of Onset   Hypertension Mother    Hypertension Sister    Multiple sclerosis Sister        died 133  Heart disease Brother        S/P CABG   Breast cancer Maternal Aunt     Social History   Socioeconomic History   Marital status: Widowed    Spouse name: Not on file   Number of children: 4   Years of education: Not on file   Highest education level: Not on file  Occupational History   Occupation: retired  Tobacco Use   Smoking status: Former   Smokeless tobacco: Never  VScientific laboratory technicianUse: Never used  Substance and Sexual Activity   Alcohol use: Yes    Alcohol/week: 0.0 standard drinks    Comment: occassionally   Drug use: No   Sexual activity: Never  Other Topics Concern   Not on file  Social History Narrative   Not on file   Social Determinants of Health   Financial Resource Strain: Low Risk    Difficulty of Paying Living Expenses: Not hard at all  Food Insecurity: No Food Insecurity   Worried About RCharity fundraiserin the Last Year: Never true   RNew Seaburyin the Last Year: Never true  Transportation Needs: No Transportation Needs   Lack of Transportation (Medical): No   Lack of Transportation (Non-Medical): No  Physical Activity: Sufficiently Active   Days of Exercise per  Week: 5 days   Minutes of Exercise per Session: 30 min  Stress: No Stress Concern Present   Feeling of Stress : Not at all  Social Connections: Moderately Integrated   Frequency of Communication with Friends and Family: More than three times a week   Frequency of Social Gatherings with Friends and Family: Twice a week   Attends Religious Services: More than 4 times per year   Active Member of [employer] or Organizations: Yes   Attends Archivist Meetings: Not on file   Marital Status: Widowed     Review of Systems  Constitutional:  Negative for appetite change and unexpected weight change.  HENT:  Negative for congestion, sinus pressure and sore throat.   Eyes:  Negative for pain and visual disturbance.  Respiratory:  Negative for cough, chest tightness and shortness of breath.   Cardiovascular:  Negative for chest pain, palpitations and  leg swelling.  Gastrointestinal:  Negative for abdominal pain, diarrhea, nausea and vomiting.  Genitourinary:  Negative for difficulty urinating and dysuria.  Musculoskeletal:  Negative for joint swelling and myalgias.  Skin:  Negative for color change and rash.  Neurological:  Negative for dizziness, light-headedness and headaches.  Hematological:  Negative for adenopathy. Does not bruise/bleed easily.  Psychiatric/Behavioral:  Negative for agitation and dysphoric mood.       Objective:     BP (!) 150/80 (BP Location: Left Arm, Patient Position: Sitting, Cuff Size: Small)   Pulse 67   Temp 98 F (36.7 C) (Temporal)   Resp 14   Ht 5' (1.524 m)   Wt 128 lb 4.8 oz (58.2 kg)   SpO2 95%   BMI 25.06 kg/m  Wt Readings from Last 3 Encounters:  08/01/21 130 lb (59 kg)  07/30/21 128 lb 4.8 oz (58.2 kg)  05/06/21 128 lb (58.1 kg)    Physical Exam Vitals reviewed.  Constitutional:      General: She is not in acute distress.    Appearance: Normal appearance. She is well-developed.  HENT:     Head: Normocephalic and atraumatic.     Right Ear: External ear normal.     Left Ear: External ear normal.  Eyes:     General: No scleral icterus.       Right eye: No discharge.        Left eye: No discharge.     Conjunctiva/sclera: Conjunctivae normal.  Neck:     Thyroid: No thyromegaly.  Cardiovascular:     Rate and Rhythm: Normal rate and regular rhythm.  Pulmonary:     Effort: No tachypnea, accessory muscle usage or respiratory distress.     Breath sounds: Normal breath sounds. No decreased breath sounds or wheezing.  Chest:  Breasts:    Right: No inverted nipple, mass, nipple discharge or tenderness (no axillary adenopathy).     Left: No inverted nipple, mass, nipple discharge or tenderness (no axilarry adenopathy).  Abdominal:     General: Bowel sounds are normal.     Palpations: Abdomen is soft.     Tenderness: There is no abdominal tenderness.  Musculoskeletal:         General: No swelling or tenderness.     Cervical back: Neck supple.  Lymphadenopathy:     Cervical: No cervical adenopathy.  Skin:    Findings: No erythema or rash.  Neurological:     Mental Status: She is alert and oriented to person, place, and time.  Psychiatric:        Mood  and Affect: Mood normal.        Behavior: Behavior normal.     Outpatient Encounter Medications as of 07/30/2021  Medication Sig   acetaminophen (TYLENOL) 325 MG tablet Take 650 mg by mouth every 6 (six) hours as needed for moderate pain or headache.   benazepril (LOTENSIN) 40 MG tablet Take 1 tablet (40 mg total) by mouth 2 (two) times daily.   Calcium Carbonate-Vitamin D 600-400 MG-UNIT tablet Take 1 tablet by mouth in the morning.   felodipine (PLENDIL) 2.5 MG 24 hr tablet Take 1 tablet (2.5 mg total) by mouth daily.   furosemide (LASIX) 20 MG tablet Take 1 tablet (20 mg total) by mouth daily as needed.   Multiple Vitamin (MULTIVITAMIN WITH MINERALS) TABS tablet Take 1 tablet by mouth in the morning.   Omega-3 Fatty Acids (FISH OIL) 1000 MG CAPS Take 1,000 mg by mouth every evening.   rosuvastatin (CRESTOR) 5 MG tablet Take 1 tablet (5 mg total) by mouth daily.   [DISCONTINUED] amiodarone (PACERONE) 200 MG tablet Take 1 tablet (200 mg total) by mouth daily.   [DISCONTINUED] apixaban (ELIQUIS) 2.5 MG TABS tablet Take 1 tablet (2.5 mg total) by mouth 2 (two) times daily.   No facility-administered encounter medications on file as of 07/30/2021.     Lab Results  Component Value Date   WBC 5.3 03/31/2021   HGB 14.4 03/31/2021   HCT 43.7 03/31/2021   PLT 188 03/31/2021   GLUCOSE 88 07/30/2021   CHOL 181 07/25/2021   TRIG 58.0 07/25/2021   HDL 96.60 07/25/2021   LDLCALC 73 07/25/2021   ALT 33 07/25/2021   AST 28 07/25/2021   NA 140 07/30/2021   K 4.0 07/30/2021   CL 101 07/30/2021   CREATININE 1.17 07/30/2021   BUN 18 07/30/2021   CO2 29 07/30/2021   TSH 1.29 07/25/2021   HGBA1C 5.6 07/25/2021     MM DIAG BREAST TOMO BILATERAL  Result Date: 05/27/2021 CLINICAL DATA:  Patient with history of left breast lumpectomy. EXAM: DIGITAL DIAGNOSTIC BILATERAL MAMMOGRAM WITH TOMOSYNTHESIS AND CAD TECHNIQUE: Bilateral digital diagnostic mammography and breast tomosynthesis was performed. The images were evaluated with computer-aided detection. COMPARISON:  Previous exam(s). ACR Breast Density Category c: The breast tissue is heterogeneously dense, which may obscure small masses. FINDINGS: Interval postlumpectomy changes left breast. No new masses, calcifications or nonsurgical distortion identified within either breast. IMPRESSION: No mammographic evidence for malignancy. RECOMMENDATION: Bilateral diagnostic mammography 1 year. I have discussed the findings and recommendations with the patient. If applicable, a reminder letter will be sent to the patient regarding the next appointment. BI-RADS CATEGORY  2: Benign. Electronically Signed   By: Lovey Newcomer M.D.   On: 05/27/2021 11:31      Assessment & Plan:   Problem List Items Addressed This Visit     Atrial fibrillation [facility])    S/p cardioversion.  Appears to be in SR today.  Continues on eliquis, amiodarone and metoprolol.         Decreased GFR   Relevant Orders   Basic metabolic panel (Completed)   Urinalysis, Routine w reflex microscopic (Completed)   Essential hypertension    On metoprolol 12.26m q day. Continue benazepril.  Follow pressures.  Follow metabolic panel.       Relevant Orders   Basic Metabolic Panel (BMET)   TSH   Health care maintenance    Physical today 07/30/21.  Mammogram 05/27/21 - birads I.  Seeing Dr BBary Castilla  History of breast cancer    Was being followed by Dr Rogue Bussing.  On no active treatment.  Has been released.  Has been seeing Dr Bary Castilla.  S/p left lumpectomy.  Mammogram 05/27/21 - Birads I.  Recommended f/u with Dr Peyton Najjar in one year.         History of colonic polyps    Colonoscopy 2012 -  six polyps.  GI felt no further colonoscopy warranted.         Hyperglycemia    Low carb diet and exercise.  Follow met b and a1c.        Relevant Orders   HgB A1c   Mixed hyperlipidemia    Continue crestor.  Low cholesterol diet and exercise.  Follow lipid panel and liver function tests.        Other Visit Diagnoses     Routine general medical examination at a health care facility    -  Primary   Abnormal liver function tests       Relevant Orders   Hepatic function panel        Einar Pheasant, MD

## 2021-07-31 ENCOUNTER — Telehealth: Payer: Self-pay

## 2021-07-31 NOTE — Telephone Encounter (Signed)
LMTCB for lab results.  

## 2021-08-01 ENCOUNTER — Ambulatory Visit
Admission: RE | Admit: 2021-08-01 | Discharge: 2021-08-01 | Disposition: A | Discharge status: Home / Self Care | Payer: PPO | Attending: Internal Medicine | Admitting: Internal Medicine

## 2021-08-01 ENCOUNTER — Ambulatory Visit: Payer: PPO | Admitting: Internal Medicine

## 2021-08-01 ENCOUNTER — Ambulatory Visit
Admission: RE | Admit: 2021-08-01 | Discharge: 2021-08-01 | Disposition: A | Discharge status: Home / Self Care | Payer: PPO | Source: Ambulatory Visit | Attending: Internal Medicine | Admitting: Internal Medicine

## 2021-08-01 ENCOUNTER — Encounter: Payer: Self-pay | Admitting: Internal Medicine

## 2021-08-01 VITALS — BP 136/56 | HR 55 | Ht 60.0 in | Wt 130.0 lb

## 2021-08-01 DIAGNOSIS — I700 Atherosclerosis of aorta: Secondary | ICD-10-CM | POA: Diagnosis not present

## 2021-08-01 DIAGNOSIS — Z79899 Other long term (current) drug therapy: Secondary | ICD-10-CM | POA: Diagnosis not present

## 2021-08-01 DIAGNOSIS — I4819 Other persistent atrial fibrillation: Secondary | ICD-10-CM

## 2021-08-01 DIAGNOSIS — I10 Essential (primary) hypertension: Secondary | ICD-10-CM | POA: Diagnosis not present

## 2021-08-01 DIAGNOSIS — E782 Mixed hyperlipidemia: Secondary | ICD-10-CM | POA: Diagnosis not present

## 2021-08-01 DIAGNOSIS — I4891 Unspecified atrial fibrillation: Secondary | ICD-10-CM | POA: Diagnosis not present

## 2021-08-01 DIAGNOSIS — R0789 Other chest pain: Secondary | ICD-10-CM | POA: Diagnosis not present

## 2021-08-01 MED ORDER — APIXABAN 2.5 MG PO TABS: ORAL_TABLET | ORAL | 0 refills | Status: DC

## 2021-08-01 MED ORDER — AMIODARONE HCL 100 MG PO TABS: 100.0000 mg | ORAL_TABLET | Freq: Every day | ORAL | 1 refills | Status: DC

## 2021-08-01 NOTE — Patient Instructions (Signed)
Medication Instructions:   Your physician has recommended you make the following change in your medication:   DECREASE Amiodarone 100 mg daily   *If you need a refill on your cardiac medications before your next appointment, please call your pharmacy*   Lab Work:  None ordered  Testing/Procedures:  Your provider has ordered a Chest X-Ray to be done today at the medical mall at [facility]:  A chest x-ray takes a picture of the organs and structures inside the chest, including the heart, lungs, and blood vessels. This test can show several things, including, whether the heart is enlarged; whether fluid is building up in the lungs; and whether pacemaker / defibrillator leads are still in place.  -  Please go to the [place] will check in at the registration desk to the right as you walk into the atrium.    Follow-Up: At [facility], you and your health needs are our priority.  As part of our continuing mission to provide you with exceptional heart care, we have created designated Provider Care Teams.  These Care Teams include your primary Cardiologist (physician) and Advanced Practice Providers (APPs -  Physician Assistants and Nurse Practitioners) who all work together to provide you with the care you need, when you need it.  We recommend signing up for the patient portal called "MyChart".  Sign up information is provided on this After Visit Summary.  MyChart is used to connect with patients for Virtual Visits (Telemedicine).  Patients are able to view lab/test results, encounter notes, upcoming appointments, etc.  Non-urgent messages can be sent to your provider as well.   To learn more about what you can do with MyChart, go to [email or web address].    Your next appointment:   6 month(s)  The format for your next appointment:   In Person  Provider:   You may see Nelva Bush, MD or one of the following Advanced Practice Providers on your designated Care Team:    Murray Hodgkins, NP Christell Faith, PA-C Cadence Kathlen Mody, PA-C{  Important Information About Sugar

## 2021-08-01 NOTE — Progress Notes (Signed)
Follow-up Outpatient Visit Date: 08/01/2021  Primary Care Provider: Einar Pheasant, [place] [address] [place] [address]  Chief Complaint: Follow-up atrial fibrillation  HPI:  Erin [name] is a 88 y.o. female with history of persistent atrial fibrillation, hypertension, hyperlipidemia, and breast cancer, who presents for follow-up of fibrillation.  I last saw her in November, at which time she was feeling fairly well.  She still noted some exertional dyspnea when walking, which was stable.  She reported resting heart rates in the 40s and 50s during the daytime on her Apple Watch, sometimes dipping into the upper 30s while asleep.  We agreed to defer medication changes and additional testing at that time.  Today, Erin [name] reports that she has been feeling fairly well.  She still notes somewhat low heart rates, dipping down to 38 bpm when she is asleep.  Resting heart rate while awake is usually in the 50s.  She denies palpitations, lightheadedness, and edema.  She has stable exertional dyspnea when she walks briskly, though she is otherwise without shortness of breath.  She notes sporadic pressure her chest that comes and goes, lasting a few minutes at a time.  It is not exertional.  She has not had any significant bleeding.  She tripped a few months ago and fell but fortunately did not injure herself.  --------------------------------------------------------------------------------------------------  Cardiovascular History & Procedures: Cardiovascular Problems: Persistent atrial fibrillation Atypical chest pain   Risk Factors: Hypertension, hyperlipidemia, and age greater than 39   Cath/PCI: None   CV Surgery: None   EP Procedures and Devices: DCCV (09/03/2020) DCCV (07/30/2020): Unsuccessful - amiodarone load started Event monitor (10/07/16): Predominantly sinus rhythm with frequent PACs and occasional PVCs. Single episode of NSVT. Multiple atrial runs lasting  up to 19.4 seconds. No sustained arrhythmias. Event monitor (07/07/16): Predominantly sinus rhythm with 20 episodes of narrow complex tachycardia and irregularity consistent with atrial fibrillation (longest episode noted approximately 12 seconds). Frequent PACs and rare PVCs were noted.   Non-Invasive Evaluation(s): Pharmacologic MPI (10/31/2020): Low risk study without ischemia or scar.  LVEF greater than 65%.  Coronary artery calcification and aortic atherosclerosis noted. TTE (07/04/2020): Normal LV size and wall thickness.  LVEF 60-65% with normal wall motion.  Normal RV size and function.  Moderate left atrial enlargement.  No significant valvular abnormality. TTE (08/06/16): Normal LV size with LVEF of 55-65%. Normal wall motion with grade 1 diastolic dysfunction. Mild left atrial enlargement. Normal RV size and function. Exercise MPI (07/13/16): Low risk study without ischemia or scar. LVEF greater than 65%. Decreased exercise capacity, exercising 4 minutes, zero seconds, achieving 117 bpm (90% MPHR; 4.6 METs).  Recent CV Pertinent Labs: Lab Results  Component Value Date   CHOL 181 07/25/2021   HDL 96.60 07/25/2021   LDLCALC 73 07/25/2021   TRIG 58.0 07/25/2021   CHOLHDL 2 07/25/2021   K 4.0 07/30/2021   K 3.5 12/21/2011   MG 2.0 06/07/2020   BUN 18 07/30/2021   CREATININE 1.17 07/30/2021   CREATININE 0.92 (H) 06/07/2020    Past medical and surgical history were reviewed and updated in EPIC.  Current Meds  Medication Sig   acetaminophen (TYLENOL) 325 MG tablet Take 650 mg by mouth every 6 (six) hours as needed for moderate pain or headache.   benazepril (LOTENSIN) 40 MG tablet Take 1 tablet (40 mg total) by mouth 2 (two) times daily.   Calcium Carbonate-Vitamin D 600-400 MG-UNIT tablet Take 1 tablet by mouth in the morning.  felodipine (PLENDIL) 2.5 MG 24 hr tablet Take 1 tablet (2.5 mg total) by mouth daily.   furosemide (LASIX) 20 MG tablet Take 1 tablet (20 mg total) by mouth  daily as needed.   Multiple Vitamin (MULTIVITAMIN WITH MINERALS) TABS tablet Take 1 tablet by mouth in the morning.   Omega-3 Fatty Acids (FISH OIL) 1000 MG CAPS Take 1,000 mg by mouth every evening.   rosuvastatin (CRESTOR) 5 MG tablet Take 1 tablet (5 mg total) by mouth daily.   [DISCONTINUED] amiodarone (PACERONE) 200 MG tablet Take 1 tablet (200 mg total) by mouth daily.   [DISCONTINUED] apixaban (ELIQUIS) 2.5 MG TABS tablet Take 1 tablet (2.5 mg total) by mouth 2 (two) times daily.    Allergies: Erythromycin and Minocin [minocycline hcl]  Social History   Tobacco Use   Smoking status: Former   Smokeless tobacco: Never  Vaping Use   Vaping Use: Never used  Substance Use Topics   Alcohol use: Yes    Alcohol/week: 0.0 standard drinks    Comment: occassionally   Drug use: No    Family History  Problem Relation Age of Onset   Hypertension Mother    Hypertension Sister    Multiple sclerosis Sister        died 74   Heart disease Brother        S/P CABG   Breast cancer Maternal Aunt     Review of Systems: A 12-system review of systems was performed and was negative except as noted in the HPI.  --------------------------------------------------------------------------------------------------  Physical Exam: BP (!) 136/56 (BP Location: Left Arm, Patient Position: Sitting, Cuff Size: Normal)   Pulse (!) 55   Ht 5' (1.524 m)   Wt 130 lb (59 kg)   SpO2 98%   BMI 25.39 kg/m   General:  NAD. Neck: No JVD or HJR. Lungs: Clear to auscultation bilaterally without wheezes or crackles. Heart: Bradycardic but regular without murmurs, rubs, or gallops. Abdomen: Soft, nontender, nondistended. Extremities: No lower extremity edema.  EKG:  Sinus bradycardia with nonspecific ST/T changes.  HR has increased since 01/31/2021.  Otherwise, no significant interval change.  Lab Results  Component Value Date   WBC 5.3 03/31/2021   HGB 14.4 03/31/2021   HCT 43.7 03/31/2021   MCV  99.3 03/31/2021   PLT 188 03/31/2021    Lab Results  Component Value Date   NA 140 07/30/2021   K 4.0 07/30/2021   CL 101 07/30/2021   CO2 29 07/30/2021   BUN 18 07/30/2021   CREATININE 1.17 07/30/2021   GLUCOSE 88 07/30/2021   ALT 33 07/25/2021    Lab Results  Component Value Date   CHOL 181 07/25/2021   HDL 96.60 07/25/2021   LDLCALC 73 07/25/2021   TRIG 58.0 07/25/2021   CHOLHDL 2 07/25/2021    --------------------------------------------------------------------------------------------------  ASSESSMENT AND PLAN: Persistent atrial fibrillation: Erin [name] is maintaining sinus rhythm on amiodarone.  She is not on any AV nodal blocking agents but typically has mild bradycardia that dips down into the upper 30s when asleep.  We have agreed to decrease amiodarone to 100 mg daily to see if this helps increase her heart rate.  Recent labs showed normal thyroid function, electrolytes, and liver function.  We will check a PA and lateral chest radiograph today to ensure there are no findings suggestive of amiodarone lung toxicity.  Continue indefinite anticoagulation with apixaban 2.5 mg twice daily based on age and weight.  Atypical chest pain: Erin [name] reports sporadic  chest pressure that is not exertional.  MPI in 10/2020 was low risk without findings of ischemia.  I suspect her discomfort is noncardiac in nature.  We will defer medication changes.  Hypertension: Blood pressure is upper normal systolic today.  No medication changes at this time.  Hyperlipidemia: Lipids well controlled on labs earlier this month with LDL of 73.  Continue low-dose rosuvastatin with ongoing follow-up by Dr. Nicki Reaper.  Follow-up: Return to clinic in 6 months.  Nelva Bush, MD 08/01/2021 9:52 AM

## 2021-08-03 NOTE — Assessment & Plan Note (Signed)
Low carb diet and exercise.  Follow met b and a1c.

## 2021-08-03 NOTE — Assessment & Plan Note (Signed)
Colonoscopy 2012 - six polyps.  GI felt no further colonoscopy warranted.

## 2021-08-03 NOTE — Assessment & Plan Note (Signed)
S/p cardioversion.  Appears to be in SR today.  Continues on eliquis, amiodarone and metoprolol.

## 2021-08-03 NOTE — Assessment & Plan Note (Signed)
Continue crestor.  Low cholesterol diet and exercise. Follow lipid panel and liver function tests.   

## 2021-08-03 NOTE — Assessment & Plan Note (Signed)
Physical today 07/30/21.  Mammogram 05/27/21 - birads I.  Seeing Dr Bary Castilla.

## 2021-08-03 NOTE — Assessment & Plan Note (Signed)
On metoprolol 12.5mg q day. Continue benazepril.  Follow pressures.  Follow metabolic panel. 

## 2021-08-03 NOTE — Assessment & Plan Note (Signed)
Was being followed by Dr Brahmanday.  On no active treatment.  Has been released.  Has been seeing Dr Byrnett.  S/p left lumpectomy.  Mammogram 05/27/21 - Birads I.  Recommended f/u with Dr Cintron in one year.   

## 2021-08-20 DIAGNOSIS — H903 Sensorineural hearing loss, bilateral: Secondary | ICD-10-CM | POA: Diagnosis not present

## 2021-08-20 DIAGNOSIS — H6123 Impacted cerumen, bilateral: Secondary | ICD-10-CM | POA: Diagnosis not present

## 2021-08-27 DIAGNOSIS — L570 Actinic keratosis: Secondary | ICD-10-CM | POA: Diagnosis not present

## 2021-09-11 ENCOUNTER — Encounter: Payer: Self-pay | Admitting: Radiation Oncology

## 2021-09-11 ENCOUNTER — Ambulatory Visit
Admission: RE | Admit: 2021-09-11 | Discharge: 2021-09-11 | Disposition: A | Discharge status: Home / Self Care | Payer: PPO | Source: Ambulatory Visit | Attending: Radiation Oncology | Admitting: Radiation Oncology

## 2021-09-11 VITALS — BP 147/70 | HR 52 | Temp 97.3°F | Resp 16 | Wt 128.5 lb

## 2021-09-11 DIAGNOSIS — Z853 Personal history of malignant neoplasm of breast: Secondary | ICD-10-CM | POA: Diagnosis not present

## 2021-09-11 DIAGNOSIS — C50412 Malignant neoplasm of upper-outer quadrant of left female breast: Secondary | ICD-10-CM

## 2021-09-11 DIAGNOSIS — D0512 Intraductal carcinoma in situ of left breast: Secondary | ICD-10-CM | POA: Diagnosis not present

## 2021-09-11 DIAGNOSIS — Z08 Encounter for follow-up examination after completed treatment for malignant neoplasm: Secondary | ICD-10-CM | POA: Diagnosis not present

## 2021-09-11 NOTE — Progress Notes (Signed)
Radiation Oncology Follow up Note  Name: Erin [name]   Date:   09/11/2021 MRN:  [id number] DOB: May 31, 1930    This 88 y.o. female presents to the clinic today for 1 year follow-up status post whole breast radiation to her left breast for ER positive ductal carcinoma in situ.  REFERRING PROVIDER: Einar Pheasant, MD  HPI: Patient is an 88 year old female now at 1 year having completed whole breast radiation to her left breast for ER positive ductal carcinoma in situ.  Seen today in routine follow-up she is doing well.  She specifically denies breast tenderness cough or bone pain..  She is not on endocrine therapy.  She had mammograms back in March which I have reviewed were BI-RADS 2 benign  COMPLICATIONS OF TREATMENT: none  FOLLOW UP COMPLIANCE: keeps appointments   PHYSICAL EXAM:  BP (!) 147/70 (BP Location: Left Arm, Patient Position: Sitting)   Pulse (!) 52   Temp (!) 97.3 F (36.3 C) (Tympanic)   Resp 16   Wt 128 lb 8 oz (58.3 kg)   BMI 25.10 kg/m  Lungs are clear to A&P cardiac examination essentially unremarkable with regular rate and rhythm. No dominant mass or nodularity is noted in either breast in 2 positions examined. Incision is well-healed. No axillary or supraclavicular adenopathy is appreciated. Cosmetic result is excellent.  Well-developed well-nourished patient in NAD. HEENT reveals PERLA, EOMI, discs not visualized.  Oral cavity is clear. No oral mucosal lesions are identified. Neck is clear without evidence of cervical or supraclavicular adenopathy. Lungs are clear to A&P. Cardiac examination is essentially unremarkable with regular rate and rhythm without murmur rub or thrill. Abdomen is benign with no organomegaly or masses noted. Motor sensory and DTR levels are equal and symmetric in the upper and lower extremities. Cranial nerves II through XII are grossly intact. Proprioception is intact. No peripheral adenopathy or edema is identified. No motor or sensory levels  are noted. Crude visual fields are within normal range.  RADIOLOGY RESULTS: Mammograms reviewed compatible with above-stated findings  PLAN: Present time patient is doing well 1 year out from whole breast radiation for ductal carcinoma in situ.  She is doing well with no evidence of disease.  Based on her advanced age and that this is DCIS I will turn follow-up care over to her PMD which is the patient's wishes.  Be happy to reevaluate her at any time should that be indicated.  I would like to take this opportunity to thank you for allowing me to participate in the care of your patient.Noreene Filbert, MD

## 2021-09-15 ENCOUNTER — Other Ambulatory Visit: Payer: Self-pay | Admitting: Internal Medicine

## 2021-10-08 ENCOUNTER — Other Ambulatory Visit: Payer: Self-pay | Admitting: Internal Medicine

## 2021-12-01 ENCOUNTER — Other Ambulatory Visit (INDEPENDENT_AMBULATORY_CARE_PROVIDER_SITE_OTHER): Payer: PPO

## 2021-12-01 DIAGNOSIS — R7989 Other specified abnormal findings of blood chemistry: Secondary | ICD-10-CM | POA: Diagnosis not present

## 2021-12-01 DIAGNOSIS — R739 Hyperglycemia, unspecified: Secondary | ICD-10-CM

## 2021-12-01 DIAGNOSIS — I10 Essential (primary) hypertension: Secondary | ICD-10-CM

## 2021-12-01 DIAGNOSIS — E785 Hyperlipidemia, unspecified: Secondary | ICD-10-CM

## 2021-12-01 LAB — BASIC METABOLIC PANEL
BUN: 18 mg/dL (ref 6–23)
CO2: 27 mEq/L (ref 19–32)
Calcium: 9.6 mg/dL (ref 8.4–10.5)
Chloride: 101 mEq/L (ref 96–112)
Creatinine, Ser: 1.12 mg/dL (ref 0.40–1.20)
GFR: 43.54 mL/min — ABNORMAL LOW (ref >60.00)
Glucose, Bld: 91 mg/dL (ref 70–99)
Potassium: 3.8 mEq/L (ref 3.5–5.1)
Sodium: 138 mEq/L (ref 135–145)

## 2021-12-01 LAB — LIPID PANEL
Cholesterol: 173 mg/dL (ref 0–200)
HDL: 89 mg/dL (ref >39.00)
LDL Cholesterol: 70 mg/dL (ref 0–99)
NonHDL: 84.41
Total CHOL/HDL Ratio: 2
Triglycerides: 74 mg/dL (ref 0.0–149.0)
VLDL: 14.8 mg/dL (ref 0.0–40.0)

## 2021-12-01 LAB — HEPATIC FUNCTION PANEL
ALT: 18 U/L (ref 0–35)
AST: 21 U/L (ref 0–37)
Albumin: 4.1 g/dL (ref 3.5–5.2)
Alkaline Phosphatase: 68 U/L (ref 39–117)
Bilirubin, Direct: 0.2 mg/dL (ref 0.0–0.3)
Total Bilirubin: 0.8 mg/dL (ref 0.2–1.2)
Total Protein: 6.7 g/dL (ref 6.0–8.3)

## 2021-12-01 LAB — HEMOGLOBIN A1C: Hgb A1c MFr Bld: 5.6 % (ref 4.6–6.5)

## 2021-12-01 LAB — TSH: TSH: 0.88 u[IU]/mL (ref 0.35–5.50)

## 2021-12-04 ENCOUNTER — Encounter: Payer: Self-pay | Admitting: Internal Medicine

## 2021-12-04 ENCOUNTER — Other Ambulatory Visit: Payer: Self-pay

## 2021-12-04 ENCOUNTER — Ambulatory Visit (INDEPENDENT_AMBULATORY_CARE_PROVIDER_SITE_OTHER): Payer: PPO | Admitting: Internal Medicine

## 2021-12-04 VITALS — BP 128/60 | HR 52 | Temp 97.5°F | Ht 60.0 in | Wt 126.8 lb

## 2021-12-04 DIAGNOSIS — F439 Reaction to severe stress, unspecified: Secondary | ICD-10-CM | POA: Diagnosis not present

## 2021-12-04 DIAGNOSIS — D72819 Decreased white blood cell count, unspecified: Secondary | ICD-10-CM

## 2021-12-04 DIAGNOSIS — R7989 Other specified abnormal findings of blood chemistry: Secondary | ICD-10-CM

## 2021-12-04 DIAGNOSIS — Z85820 Personal history of malignant melanoma of skin: Secondary | ICD-10-CM | POA: Diagnosis not present

## 2021-12-04 DIAGNOSIS — Z853 Personal history of malignant neoplasm of breast: Secondary | ICD-10-CM | POA: Diagnosis not present

## 2021-12-04 DIAGNOSIS — I4819 Other persistent atrial fibrillation: Secondary | ICD-10-CM | POA: Diagnosis not present

## 2021-12-04 DIAGNOSIS — E782 Mixed hyperlipidemia: Secondary | ICD-10-CM

## 2021-12-04 DIAGNOSIS — Z1231 Encounter for screening mammogram for malignant neoplasm of breast: Secondary | ICD-10-CM | POA: Diagnosis not present

## 2021-12-04 DIAGNOSIS — R739 Hyperglycemia, unspecified: Secondary | ICD-10-CM

## 2021-12-04 DIAGNOSIS — I10 Essential (primary) hypertension: Secondary | ICD-10-CM | POA: Diagnosis not present

## 2021-12-04 MED ORDER — ROSUVASTATIN CALCIUM 5 MG PO TABS: 5.0000 mg | ORAL_TABLET | Freq: Every day | ORAL | 3 refills | Status: DC

## 2021-12-04 MED ORDER — BENAZEPRIL HCL 40 MG PO TABS: 40.0000 mg | ORAL_TABLET | Freq: Every day | ORAL | 1 refills | Status: DC

## 2021-12-04 MED ORDER — AMIODARONE HCL 100 MG PO TABS: 100.0000 mg | ORAL_TABLET | Freq: Every day | ORAL | 1 refills | Status: DC

## 2021-12-04 MED ORDER — FELODIPINE ER 2.5 MG PO TB24: 2.5000 mg | ORAL_TABLET | Freq: Every day | ORAL | 3 refills | Status: DC

## 2021-12-04 NOTE — Progress Notes (Signed)
Patient ID: Erin [name], female   DOB: 10/20/1930, 88 y.o.   MRN: [id number]   Subjective:    Patient ID: Erin [name], female    DOB: 1930/08/29, 88 y.o.   MRN: [id number]   Patient here for  Chief Complaint  Patient presents with   Follow-up    4 month follow up   .   HPI Here to follow up regarding her blood pressure and cholesterol.  History of afib.  S/p cardioversion.  Sees Dr End.  Last evaluated 07/2021.  Amiodarone decreased to 116m daily.  On eliquis.  Reports doing well.  Discussed heart rate.  Does appear to be doing better since decrease.  No headache, light headedness or dizziness.  No chest pain.  Still walks.  No acid reflux.  No abdominal pain.  Walks 1 1/2 mile per day.  Handling stress.  Up to date mammogram -    Past Medical History:  Diagnosis Date   A-fib ([facility]    [place]    [place]    melanoma - arm left   Chest pain 07/13/2016   History of colon polyps    Hypercholesterolemia    Hypertension    Personal history of radiation therapy    Past Surgical History:  Procedure Laterality Date   APPENDECTOMY  1946   BREAST BIOPSY Left 06/04/2020   uKoreabx, vision marker, positive   BREAST LUMPECTOMY Left 06/24/2020   BREAST LUMPECTOMY WITH SENTINEL LYMPH NODE BIOPSY Left 06/24/2020   Procedure: BREAST LUMPECTOMY WITH SENTINEL LYMPH NODE BIOPSY;  Surgeon: BRobert Bellow MD;  [facility];  Service: General;  Laterality: Left;   CARDIOVERSION N/A 07/30/2020   Procedure: CARDIOVERSION;  Surgeon: ENelva Bush MD;  Location: [facility];  Service: Cardiovascular;  Laterality: N/A;   CARDIOVERSION N/A 09/03/2020   Procedure: CARDIOVERSION;  Surgeon: ENelva Bush MD;  Location: [facility];  Service: Cardiovascular;  Laterality: N/A;   CATARACT EXTRACTION Bilateral    DILATION AND CURETTAGE OF UTERUS     LAMINECTOMY  2000   release of foraminal stenosis  2003   Family History  Problem Relation Age of Onset   Hypertension Mother     Hypertension Sister    Multiple sclerosis Sister        died 126  Heart disease Brother        S/P CABG   Breast cancer Maternal Aunt    Social History   Socioeconomic History   Marital status: Widowed    Spouse name: Not on file   Number of children: 4   Years of education: Not on file   Highest education level: Not on file  Occupational History   Occupation: retired  Tobacco Use   Smoking status: Former   Smokeless tobacco: Never  VScientific laboratory technicianUse: Never used  Substance and Sexual Activity   Alcohol use: Yes    Alcohol/week: 0.0 standard drinks of alcohol    Comment: occassionally   Drug use: No   Sexual activity: Never  Other Topics Concern   Not on file  Social History Narrative   Not on file   Social Determinants of Health   Financial Resource Strain: Low Risk  (05/06/2021)   Overall Financial Resource Strain (CARDIA)    Difficulty of Paying Living Expenses: Not hard at all  Food Insecurity: No Food Insecurity (05/06/2021)   Hunger Vital Sign    Worried About Running Out of Food in the Last Year:  Never true    Ran Out of Food in the Last Year: Never true  Transportation Needs: No Transportation Needs (05/06/2021)   PRAPARE - Hydrologist (Medical): No    Lack of Transportation (Non-Medical): No  Physical Activity: Sufficiently Active (05/06/2021)   Exercise Vital Sign    Days of Exercise per Week: 5 days    Minutes of Exercise per Session: 30 min  Stress: No Stress Concern Present (05/06/2021)   [place]    Feeling of Stress : Not at all  Social Connections: Moderately Integrated (05/06/2021)   Social Connection and Isolation Panel [NHANES]    Frequency of Communication with Friends and Family: More than three times a week    Frequency of Social Gatherings with Friends and Family: Twice a week    Attends Religious Services: More than 4 times per year     Active Member of [employer] or Organizations: Yes    Attends Archivist Meetings: Not on file    Marital Status: Widowed     Review of Systems  Constitutional:  Negative for appetite change and unexpected weight change.  HENT:  Negative for congestion and sinus pressure.   Respiratory:  Negative for cough, chest tightness and shortness of breath.   Cardiovascular:  Negative for chest pain, palpitations and leg swelling.  Gastrointestinal:  Negative for abdominal pain, diarrhea, nausea and vomiting.  Genitourinary:  Negative for difficulty urinating and dysuria.  Musculoskeletal:  Negative for joint swelling and myalgias.  Skin:  Negative for color change and rash.  Neurological:  Negative for dizziness, light-headedness and headaches.  Psychiatric/Behavioral:  Negative for agitation and dysphoric mood.        Objective:     BP 128/60 (BP Location: Left Arm, Patient Position: Sitting, Cuff Size: Normal)   Pulse (!) 52   Temp (!) 97.5 F (36.4 C) (Oral)   Ht 5' (1.524 m)   Wt 126 lb 12.8 oz (57.5 kg)   SpO2 96%   BMI 24.76 kg/m  Wt Readings from Last 3 Encounters:  12/04/21 126 lb 12.8 oz (57.5 kg)  09/11/21 128 lb 8 oz (58.3 kg)  08/01/21 130 lb (59 kg)    Physical Exam Vitals reviewed.  Constitutional:      General: She is not in acute distress.    Appearance: Normal appearance.  HENT:     Head: Normocephalic and atraumatic.     Right Ear: External ear normal.     Left Ear: External ear normal.  Eyes:     General: No scleral icterus.       Right eye: No discharge.        Left eye: No discharge.     Conjunctiva/sclera: Conjunctivae normal.  Neck:     Thyroid: No thyromegaly.  Cardiovascular:     Rate and Rhythm: Normal rate and regular rhythm.  Pulmonary:     Effort: No respiratory distress.     Breath sounds: Normal breath sounds. No wheezing.  Abdominal:     General: Bowel sounds are normal.     Palpations: Abdomen is soft.     Tenderness: There is  no abdominal tenderness.  Musculoskeletal:        General: No swelling or tenderness.     Cervical back: Neck supple. No tenderness.  Lymphadenopathy:     Cervical: No cervical adenopathy.  Skin:    Findings: No erythema or rash.  Neurological:  Mental Status: She is alert.  Psychiatric:        Mood and Affect: Mood normal.        Behavior: Behavior normal.      Outpatient Encounter Medications as of 12/04/2021  Medication Sig   acetaminophen (TYLENOL) 325 MG tablet Take 650 mg by mouth every 6 (six) hours as needed for moderate pain or headache.   apixaban (ELIQUIS) 2.5 MG TABS tablet Take 1 tablet (2.5 mg total) by mouth 2 (two) times daily.   Calcium Carbonate-Vitamin D 600-400 MG-UNIT tablet Take 1 tablet by mouth in the morning.   furosemide (LASIX) 20 MG tablet Take 1 tablet (20 mg total) by mouth daily as needed.   Multiple Vitamin (MULTIVITAMIN WITH MINERALS) TABS tablet Take 1 tablet by mouth in the morning.   Omega-3 Fatty Acids (FISH OIL) 1000 MG CAPS Take 1,000 mg by mouth every evening.   [DISCONTINUED] amiodarone (PACERONE) 100 MG tablet Take 1 tablet (100 mg total) by mouth daily.   [DISCONTINUED] benazepril (LOTENSIN) 40 MG tablet Take 1 tablet (40 mg total) by mouth 2 (two) times daily. (Patient taking differently: Take 40 mg by mouth daily.)   [DISCONTINUED] felodipine (PLENDIL) 2.5 MG 24 hr tablet Take 1 tablet (2.5 mg total) by mouth daily.   [DISCONTINUED] rosuvastatin (CRESTOR) 5 MG tablet Take 1 tablet (5 mg total) by mouth daily.   benazepril (LOTENSIN) 40 MG tablet Take 1 tablet (40 mg total) by mouth daily.   No facility-administered encounter medications on file as of 12/04/2021.     Lab Results  Component Value Date   WBC 5.3 03/31/2021   HGB 14.4 03/31/2021   HCT 43.7 03/31/2021   PLT 188 03/31/2021   GLUCOSE 91 12/01/2021   CHOL 173 12/01/2021   TRIG 74.0 12/01/2021   HDL 89.00 12/01/2021   LDLCALC 70 12/01/2021   ALT 18 12/01/2021   AST 21  12/01/2021   NA 138 12/01/2021   K 3.8 12/01/2021   CL 101 12/01/2021   CREATININE 1.12 12/01/2021   BUN 18 12/01/2021   CO2 27 12/01/2021   TSH 0.88 12/01/2021   HGBA1C 5.6 12/01/2021       Assessment & Plan:   Problem List Items Addressed This Visit     Atrial fibrillation [facility])    S/p cardioversion.  Appears to be in SR today.  Continues on eliquis, amiodarone and metoprolol.  Recently had dose of amiodarone decreased to 160m.  Follow.       Relevant Medications   benazepril (LOTENSIN) 40 MG tablet   Essential hypertension    On metoprolol 12.579mq day. Continue benazepril.  Follow pressures.  Follow metabolic panel.      Relevant Medications   benazepril (LOTENSIN) 40 MG tablet   Other Relevant Orders   Basic metabolic panel   History of breast cancer    Was being followed by Dr BrRogue Bussing On no active treatment.  Has been released.  Has been seeing Dr ByBary Castilla S/p left lumpectomy.  Mammogram 05/27/21 - [facility] I.  Recommended f/u with Dr CiPeyton Najjarn one year.        History of melanoma    Followed by dermatology.       Hyperglycemia    Low carb diet and exercise.  Follow met b and a1c.       Relevant Orders   Hemoglobin A1c   Leukopenia    Follow cbc.       Mixed hyperlipidemia  Continue crestor.  Low cholesterol diet and exercise.  Follow lipid panel and liver function tests.       Relevant Medications   benazepril (LOTENSIN) 40 MG tablet   Other Relevant Orders   CBC with Differential/Platelet   Lipid panel   Hepatic function panel   Stress    Handling things well.  Follow.       Other Visit Diagnoses     Encounter for screening mammogram for malignant neoplasm of breast    -  Primary   Abnormal liver function tests            Einar Pheasant, MD

## 2021-12-13 ENCOUNTER — Encounter: Payer: Self-pay | Admitting: Internal Medicine

## 2021-12-13 NOTE — Assessment & Plan Note (Signed)
Handling things well.  Follow.   

## 2021-12-13 NOTE — Assessment & Plan Note (Signed)
On metoprolol 12.'5mg'$  q day. Continue benazepril.  Follow pressures.  Follow metabolic panel.

## 2021-12-13 NOTE — Assessment & Plan Note (Signed)
Was being followed by Dr Brahmanday.  On no active treatment.  Has been released.  Has been seeing Dr Byrnett.  S/p left lumpectomy.  Mammogram 05/27/21 - Birads I.  Recommended f/u with Dr Cintron in one year.   

## 2021-12-13 NOTE — Assessment & Plan Note (Signed)
S/p cardioversion.  Appears to be in SR today.  Continues on eliquis, amiodarone and metoprolol.  Recently had dose of amiodarone decreased to '100mg'$ .  Follow.

## 2021-12-13 NOTE — Assessment & Plan Note (Signed)
Low carb diet and exercise.  Follow met b and a1c.  

## 2021-12-13 NOTE — Assessment & Plan Note (Signed)
Continue crestor.  Low cholesterol diet and exercise. Follow lipid panel and liver function tests.   

## 2021-12-13 NOTE — Assessment & Plan Note (Signed)
Follow cbc.  

## 2021-12-13 NOTE — Assessment & Plan Note (Signed)
Followed by dermatology

## 2021-12-15 ENCOUNTER — Other Ambulatory Visit: Payer: Self-pay | Admitting: Internal Medicine

## 2021-12-15 DIAGNOSIS — Z961 Presence of intraocular lens: Secondary | ICD-10-CM | POA: Diagnosis not present

## 2021-12-16 ENCOUNTER — Other Ambulatory Visit: Payer: Self-pay | Admitting: Internal Medicine

## 2021-12-16 DIAGNOSIS — I4819 Other persistent atrial fibrillation: Secondary | ICD-10-CM

## 2021-12-16 NOTE — Telephone Encounter (Signed)
Eliquis 2.'5mg'$  refill request received. Patient is 88 years old, weight-57.1kg, Crea-1.12 on 12/01/2021, Diagnosis-Afib, and last seen by Dr. Saunders Revel on 08/01/2021. Dose is appropriate based on dosing criteria. Will send in refill to requested pharmacy.

## 2021-12-16 NOTE — Telephone Encounter (Signed)
Refill request

## 2022-01-28 DIAGNOSIS — D2271 Melanocytic nevi of right lower limb, including hip: Secondary | ICD-10-CM | POA: Diagnosis not present

## 2022-01-28 DIAGNOSIS — L570 Actinic keratosis: Secondary | ICD-10-CM | POA: Diagnosis not present

## 2022-01-28 DIAGNOSIS — D2261 Melanocytic nevi of right upper limb, including shoulder: Secondary | ICD-10-CM | POA: Diagnosis not present

## 2022-01-28 DIAGNOSIS — Z85820 Personal history of malignant melanoma of skin: Secondary | ICD-10-CM | POA: Diagnosis not present

## 2022-01-28 DIAGNOSIS — Z85828 Personal history of other malignant neoplasm of skin: Secondary | ICD-10-CM | POA: Diagnosis not present

## 2022-02-12 ENCOUNTER — Encounter: Payer: Self-pay | Admitting: Internal Medicine

## 2022-02-12 ENCOUNTER — Ambulatory Visit: Payer: PPO | Attending: Internal Medicine | Admitting: Internal Medicine

## 2022-02-12 VITALS — BP 150/70 | HR 69 | Ht 60.0 in | Wt 130.0 lb

## 2022-02-12 DIAGNOSIS — I10 Essential (primary) hypertension: Secondary | ICD-10-CM | POA: Diagnosis not present

## 2022-02-12 DIAGNOSIS — I4819 Other persistent atrial fibrillation: Secondary | ICD-10-CM

## 2022-02-12 DIAGNOSIS — E782 Mixed hyperlipidemia: Secondary | ICD-10-CM | POA: Diagnosis not present

## 2022-02-12 DIAGNOSIS — R0789 Other chest pain: Secondary | ICD-10-CM

## 2022-02-12 MED ORDER — AMIODARONE HCL 100 MG PO TABS: 100.0000 mg | ORAL_TABLET | Freq: Every day | ORAL | 3 refills | Status: DC

## 2022-02-12 MED ORDER — APIXABAN 2.5 MG PO TABS: ORAL_TABLET | ORAL | 1 refills | Status: DC

## 2022-02-12 NOTE — Patient Instructions (Addendum)
Medication Instructions:  Your Physician recommend you continue on your current medication as directed.    *If you need a refill on your cardiac medications before your next appointment, please call your pharmacy*   Lab Work: None ordered today   Testing/Procedures: None ordered today   Follow-Up: At [facility], you and your health needs are our priority.  As part of our continuing mission to provide you with exceptional heart care, we have created designated Provider Care Teams.  These Care Teams include your primary Cardiologist (physician) and Advanced Practice Providers (APPs -  Physician Assistants and Nurse Practitioners) who all work together to provide you with the care you need, when you need it.  We recommend signing up for the patient portal called "MyChart".  Sign up information is provided on this After Visit Summary.  MyChart is used to connect with patients for Virtual Visits (Telemedicine).  Patients are able to view lab/test results, encounter notes, upcoming appointments, etc.  Non-urgent messages can be sent to your provider as well.   To learn more about what you can do with MyChart, go to [email or web address].    Your next appointment:   6 month(s)  The format for your next appointment:   In Person  Provider:   You may see Nelva Bush, MD or one of the following Advanced Practice Providers on your designated Care Team:   Murray Hodgkins, NP Christell Faith, PA-C Cadence Kathlen Mody, PA-C Gerrie Nordmann, NP    Please continue to monitor blood pressure.  Call office if blood pressure is consistently greater than 140/90

## 2022-02-12 NOTE — Progress Notes (Signed)
Follow-up Outpatient Visit Date: 02/12/2022  Primary Care Provider: Einar Pheasant, [address] [id number]  Chief Complaint: Follow-up atrial fibrillation  HPI:  Ms. Weisheit is a 88 y.o. female with history of persistent atrial fibrillation, hypertension, hyperlipidemia, and breast cancer, who presents for follow-up of atrial fibrillation.  I last saw her in May, at which time she was feeling well though she continued to report some bradycardia with heart rates dipping into the upper 30s while asleep.  Awake resting heart rates were typically in the 50s.  She noted some exertional dyspnea when walking briskly as well as sporadic nonexertional chest pain.  We agreed to decrease amiodarone to 100 mg daily to see if this would improve her symptoms and also allow for a slightly higher heart rate.  Preceding MPI in 11/2020 was low risk without evidence of ischemia or scar.  Today, Ms. Hudman reports that she feels fairly well.  She is only taking benazepril once a day instead of twice a day at the direction of Dr. Nicki Reaper.  Since our last visit, when amiodarone was decreased, Ms. Kowaleski has noticed a slight increase in her resting heart rate with fewer dips below 40 bpm.  Her chronic exertional dyspnea is stable.  She has not had any palpitations, lightheadedness, or falls.  She notes very rare transient chest pain that only lasts a minute or two and has been present for many years.  It is no worse than usual.  --------------------------------------------------------------------------------------------------  Cardiovascular History & Procedures: Cardiovascular Problems: Persistent atrial fibrillation Atypical chest pain   Risk Factors: Hypertension, hyperlipidemia, and age greater than 63   Cath/PCI: None   CV Surgery: None   EP Procedures and Devices: DCCV (09/03/2020) DCCV (07/30/2020): Unsuccessful - amiodarone load started Event monitor (10/07/16):  Predominantly sinus rhythm with frequent PACs and occasional PVCs. Single episode of NSVT. Multiple atrial runs lasting up to 19.4 seconds. No sustained arrhythmias. Event monitor (07/07/16): Predominantly sinus rhythm with 20 episodes of narrow complex tachycardia and irregularity consistent with atrial fibrillation (longest episode noted approximately 12 seconds). Frequent PACs and rare PVCs were noted.   Non-Invasive Evaluation(s): Pharmacologic MPI (10/31/2020): Low risk study without ischemia or scar.  LVEF greater than 65%.  Coronary artery calcification and aortic atherosclerosis noted. TTE (07/04/2020): Normal LV size and wall thickness.  LVEF 60-65% with normal wall motion.  Normal RV size and function.  Moderate left atrial enlargement.  No significant valvular abnormality. TTE (08/06/16): Normal LV size with LVEF of 55-65%. Normal wall motion with grade 1 diastolic dysfunction. Mild left atrial enlargement. Normal RV size and function. Exercise MPI (07/13/16): Low risk study without ischemia or scar. LVEF greater than 65%. Decreased exercise capacity, exercising 4 minutes, zero seconds, achieving 117 bpm (90% MPHR; 4.6 METs).  Recent CV Pertinent Labs: Lab Results  Component Value Date   CHOL 173 12/01/2021   HDL 89.00 12/01/2021   LDLCALC 70 12/01/2021   TRIG 74.0 12/01/2021   CHOLHDL 2 12/01/2021   K 3.8 12/01/2021   K 3.5 12/21/2011   MG 2.0 06/07/2020   BUN 18 12/01/2021   CREATININE 1.12 12/01/2021   CREATININE 0.92 (H) 06/07/2020    Past medical and surgical history were reviewed and updated in EPIC.  Current Meds  Medication Sig   acetaminophen (TYLENOL) 325 MG tablet Take 650 mg by mouth every 6 (six) hours as needed for moderate pain or headache.   amiodarone (PACERONE) 100 MG tablet Take 1 tablet (100  mg total) by mouth daily.   apixaban (ELIQUIS) 2.5 MG TABS tablet Take 1 tablet (2.5 mg total) by mouth 2 (two) times daily.   benazepril (LOTENSIN) 40 MG tablet Take 1  tablet (40 mg total) by mouth daily.   Calcium Carbonate-Vitamin D 600-400 MG-UNIT tablet Take 1 tablet by mouth in the morning.   felodipine (PLENDIL) 2.5 MG 24 hr tablet Take 1 tablet (2.5 mg total) by mouth daily.   furosemide (LASIX) 20 MG tablet Take 1 tablet (20 mg total) by mouth daily as needed.   Multiple Vitamin (MULTIVITAMIN WITH MINERALS) TABS tablet Take 1 tablet by mouth in the morning.   Omega-3 Fatty Acids (FISH OIL) 1000 MG CAPS Take 1,000 mg by mouth every evening.   rosuvastatin (CRESTOR) 5 MG tablet Take 1 tablet (5 mg total) by mouth daily.    Allergies: Erythromycin and Minocin [minocycline hcl]  Social History   Tobacco Use   Smoking status: Former   Smokeless tobacco: Never  Vaping Use   Vaping Use: Never used  Substance Use Topics   Alcohol use: Yes    Alcohol/week: 0.0 standard drinks of alcohol    Comment: occassionally   Drug use: No    Family History  Problem Relation Age of Onset   Hypertension Mother    Hypertension Sister    Multiple sclerosis Sister        died 84   Heart disease Brother        S/P CABG   Breast cancer Maternal Aunt     Review of Systems: A 12-system review of systems was performed and was negative except as noted in the HPI.  --------------------------------------------------------------------------------------------------  Physical Exam: BP (!) 180/84 (BP Location: Left Arm, Patient Position: Sitting, Cuff Size: Normal)   Pulse 69   Ht 5' (1.524 m)   Wt 130 lb (59 kg)   SpO2 97%   BMI 25.39 kg/m  Repeat BP: 156/70  General:  NAD. Neck: No JVD or HJR. Lungs: Clear to auscultation bilaterally without wheezes or crackles. Heart: Regular rate and rhythm without murmurs, rubs, or gallops. Abdomen: Soft, nontender, nondistended. Extremities: No lower extremity edema.  EKG: Normal sinus rhythm with artifact.  No significant abnormality.  Lab Results  Component Value Date   WBC 5.3 03/31/2021   HGB 14.4  03/31/2021   HCT 43.7 03/31/2021   MCV 99.3 03/31/2021   PLT 188 03/31/2021    Lab Results  Component Value Date   NA 138 12/01/2021   K 3.8 12/01/2021   CL 101 12/01/2021   CO2 27 12/01/2021   BUN 18 12/01/2021   CREATININE 1.12 12/01/2021   GLUCOSE 91 12/01/2021   ALT 18 12/01/2021    Lab Results  Component Value Date   CHOL 173 12/01/2021   HDL 89.00 12/01/2021   LDLCALC 70 12/01/2021   TRIG 74.0 12/01/2021   CHOLHDL 2 12/01/2021    --------------------------------------------------------------------------------------------------  ASSESSMENT AND PLAN: Persistent atrial fibrillation: Ms. Mruk is maintaining sinus rhythm.  Resting heart rates have come up slightly with de-escalation of amiodarone at our last visit.  We will plan to continue amiodarone 100 mg daily and long-term anticoagulation with apixaban 2.5 mg twice daily based on age and weight.  Hypertension: Blood pressure suboptimally controlled today but typically better on home readings provided by Ms. Passmore.  I have asked her to continue monitoring her blood pressure at home and to alert [place] if it is consistently above 140/90.  We will defer medication changes  today.  Atypical chest pain: Episodes are brief, self-limited, and rare.  Given stable, longstanding symptoms and low risk MPI in 2022, we will defer further evaluation.  Hyperlipidemia: Lipids reasonable on last check in September.  Continue rosuvastatin.  Follow-up: Return to clinic in 6 months.  Nelva Bush, MD 02/12/2022 9:25 AM

## 2022-02-13 ENCOUNTER — Encounter: Payer: Self-pay | Admitting: Internal Medicine

## 2022-02-19 DIAGNOSIS — H903 Sensorineural hearing loss, bilateral: Secondary | ICD-10-CM | POA: Diagnosis not present

## 2022-02-19 DIAGNOSIS — H6123 Impacted cerumen, bilateral: Secondary | ICD-10-CM | POA: Diagnosis not present

## 2022-03-11 ENCOUNTER — Other Ambulatory Visit: Payer: Self-pay

## 2022-03-31 ENCOUNTER — Other Ambulatory Visit (INDEPENDENT_AMBULATORY_CARE_PROVIDER_SITE_OTHER): Payer: PPO

## 2022-03-31 DIAGNOSIS — I10 Essential (primary) hypertension: Secondary | ICD-10-CM | POA: Diagnosis not present

## 2022-03-31 DIAGNOSIS — R739 Hyperglycemia, unspecified: Secondary | ICD-10-CM

## 2022-03-31 DIAGNOSIS — E782 Mixed hyperlipidemia: Secondary | ICD-10-CM | POA: Diagnosis not present

## 2022-03-31 LAB — CBC WITH DIFFERENTIAL/PLATELET
Basophils Absolute: 0 10*3/uL (ref 0.0–0.1)
Basophils Relative: 1.1 % (ref 0.0–3.0)
Eosinophils Absolute: 0.1 10*3/uL (ref 0.0–0.7)
Eosinophils Relative: 1.6 % (ref 0.0–5.0)
HCT: 43.5 % (ref 36.0–46.0)
Hemoglobin: 14.7 g/dL (ref 12.0–15.0)
Lymphocytes Relative: 21 % (ref 12.0–46.0)
Lymphs Abs: 0.9 10*3/uL (ref 0.7–4.0)
MCHC: 33.7 g/dL (ref 30.0–36.0)
MCV: 99.6 fl (ref 78.0–100.0)
Monocytes Absolute: 0.5 10*3/uL (ref 0.1–1.0)
Monocytes Relative: 12.4 % — ABNORMAL HIGH (ref 3.0–12.0)
Neutro Abs: 2.8 10*3/uL (ref 1.4–7.7)
Neutrophils Relative %: 63.9 % (ref 43.0–77.0)
Platelets: 187 10*3/uL (ref 150.0–400.0)
RBC: 4.37 Mil/uL (ref 3.87–5.11)
RDW: 13.1 % (ref 11.5–15.5)
WBC: 4.3 10*3/uL (ref 4.0–10.5)

## 2022-03-31 LAB — HEPATIC FUNCTION PANEL
ALT: 17 U/L (ref 0–35)
AST: 19 U/L (ref 0–37)
Albumin: 4.5 g/dL (ref 3.5–5.2)
Alkaline Phosphatase: 72 U/L (ref 39–117)
Bilirubin, Direct: 0.2 mg/dL (ref 0.0–0.3)
Total Bilirubin: 0.7 mg/dL (ref 0.2–1.2)
Total Protein: 6.7 g/dL (ref 6.0–8.3)

## 2022-03-31 LAB — BASIC METABOLIC PANEL
BUN: 15 mg/dL (ref 6–23)
CO2: 28 mEq/L (ref 19–32)
Calcium: 10 mg/dL (ref 8.4–10.5)
Chloride: 101 mEq/L (ref 96–112)
Creatinine, Ser: 1.05 mg/dL (ref 0.40–1.20)
GFR: 46.94 mL/min — ABNORMAL LOW (ref >60.00)
Glucose, Bld: 119 mg/dL — ABNORMAL HIGH (ref 70–99)
Potassium: 3.9 mEq/L (ref 3.5–5.1)
Sodium: 139 mEq/L (ref 135–145)

## 2022-03-31 LAB — LIPID PANEL
Cholesterol: 183 mg/dL (ref 0–200)
HDL: 95.2 mg/dL (ref >39.00)
LDL Cholesterol: 72 mg/dL (ref 0–99)
NonHDL: 88.11
Total CHOL/HDL Ratio: 2
Triglycerides: 83 mg/dL (ref 0.0–149.0)
VLDL: 16.6 mg/dL (ref 0.0–40.0)

## 2022-03-31 LAB — HEMOGLOBIN A1C: Hgb A1c MFr Bld: 5.6 % (ref 4.6–6.5)

## 2022-04-06 ENCOUNTER — Ambulatory Visit (INDEPENDENT_AMBULATORY_CARE_PROVIDER_SITE_OTHER): Payer: PPO | Admitting: Internal Medicine

## 2022-04-06 ENCOUNTER — Encounter: Payer: Self-pay | Admitting: Internal Medicine

## 2022-04-06 VITALS — BP 138/78 | HR 80 | Temp 98.0°F | Resp 16 | Ht 60.0 in | Wt 128.2 lb

## 2022-04-06 DIAGNOSIS — E785 Hyperlipidemia, unspecified: Secondary | ICD-10-CM

## 2022-04-06 DIAGNOSIS — I4819 Other persistent atrial fibrillation: Secondary | ICD-10-CM | POA: Diagnosis not present

## 2022-04-06 DIAGNOSIS — R739 Hyperglycemia, unspecified: Secondary | ICD-10-CM | POA: Diagnosis not present

## 2022-04-06 DIAGNOSIS — I10 Essential (primary) hypertension: Secondary | ICD-10-CM | POA: Diagnosis not present

## 2022-04-06 DIAGNOSIS — D0592 Unspecified type of carcinoma in situ of left breast: Secondary | ICD-10-CM

## 2022-04-06 DIAGNOSIS — E782 Mixed hyperlipidemia: Secondary | ICD-10-CM | POA: Diagnosis not present

## 2022-04-06 DIAGNOSIS — Z853 Personal history of malignant neoplasm of breast: Secondary | ICD-10-CM | POA: Diagnosis not present

## 2022-04-06 MED ORDER — BENAZEPRIL HCL 40 MG PO TABS: 40.0000 mg | ORAL_TABLET | Freq: Two times a day (BID) | ORAL | 1 refills | Status: DC

## 2022-04-06 NOTE — Assessment & Plan Note (Signed)
On metoprolol 12.'5mg'$  q day. Continue benazepril.  Back on bid dosing now.  Reviewed attached blood pressures - most readings 120-130/60-70s.  Follow pressures.  Follow metabolic panel.

## 2022-04-06 NOTE — Assessment & Plan Note (Addendum)
S/p cardioversion.  Appears to be in SR today.  Continues on eliquis and amiodarone.  Recently had dose of amiodarone decreased to '100mg'$ .  Resting heart rate has improved some.  Seeing Dr End. Follow.

## 2022-04-06 NOTE — Progress Notes (Signed)
Patient ID: Erin [name], female   DOB: March 02, 1931, 89 y.o.   MRN: [id number]   Subjective:    Patient ID: Erin [name], female    DOB: Jan 17, 1931, 89 y.o.   MRN: [id number]   Patient here for  Chief Complaint  Patient presents with   Medical Management of Chronic Issues   .   HPI Here to follow up regarding her blood pressure and cholesterol.  History of afib.  S/p cardioversion.  Sees Dr End.  Last evaluated 02/12/22.  Continues on amiodarone and eliquis.  Resting heart rate has improved some. Reports doing well. No headache, light headedness or dizziness.  No chest pain.  Still walks.  No acid reflux.  No abdominal pain.  Tries to walk 1 1/2 mile per day.  Handling stress.  Up to date mammogram.  Was seeing Dr Bary Castilla.  Planning to f/u with Dr Peyton Najjar.     Past Medical History:  Diagnosis Date   A-fib [facility])    Breast cancer ([place])    Cancer ([place])    melanoma - arm left   Chest pain 07/13/2016   History of colon polyps    Hypercholesterolemia    Hypertension    Personal history of radiation therapy    Past Surgical History:  Procedure Laterality Date   APPENDECTOMY  1946   BREAST BIOPSY Left 06/04/2020   [place] bx, vision marker, positive   BREAST LUMPECTOMY Left 06/24/2020   BREAST LUMPECTOMY WITH SENTINEL LYMPH NODE BIOPSY Left 06/24/2020   Procedure: BREAST LUMPECTOMY WITH SENTINEL LYMPH NODE BIOPSY;  Surgeon: Robert Bellow, MD;  Location: [place] [facility];  Service: General;  Laterality: Left;   CARDIOVERSION N/A 07/30/2020   Procedure: CARDIOVERSION;  Surgeon: Nelva Bush, MD;  Location: [facility];  Service: Cardiovascular;  Laterality: N/A;   CARDIOVERSION N/A 09/03/2020   Procedure: CARDIOVERSION;  Surgeon: Nelva Bush, MD;  Location: [facility];  Service: Cardiovascular;  Laterality: N/A;   CATARACT EXTRACTION Bilateral    DILATION AND CURETTAGE OF UTERUS     LAMINECTOMY  2000   release of foraminal stenosis  2003   Family History  Problem Relation Age of Onset    Hypertension Mother    Hypertension Sister    Multiple sclerosis Sister        died 31   Heart disease Brother        S/P CABG   Breast cancer Maternal Aunt    Social History   Socioeconomic History   Marital status: Widowed    Spouse name: Not on file   Number of children: 4   Years of education: Not on file   Highest education level: Not on file  Occupational History   Occupation: retired  Tobacco Use   Smoking status: Former   Smokeless tobacco: Never  Scientific laboratory technician Use: Never used  Substance and Sexual Activity   Alcohol use: Yes    Alcohol/week: 0.0 standard drinks of alcohol    Comment: occassionally   Drug use: No   Sexual activity: Never  Other Topics Concern   Not on file  Social History Narrative   Not on file   Social Determinants of Health   Financial Resource Strain: Low Risk  (05/06/2021)   Overall Financial Resource Strain (CARDIA)    Difficulty of Paying Living Expenses: Not hard at all  Food Insecurity: No Food Insecurity (05/06/2021)   Hunger Vital Sign    Worried About Running Out of Food in the Last Year:  Never true    Ran Out of Food in the Last Year: Never true  Transportation Needs: No Transportation Needs (05/06/2021)   PRAPARE - Hydrologist (Medical): No    Lack of Transportation (Non-Medical): No  Physical Activity: Sufficiently Active (05/06/2021)   Exercise Vital Sign    Days of Exercise per Week: 5 days    Minutes of Exercise per Session: 30 min  Stress: No Stress Concern Present (05/06/2021)   [place]    Feeling of Stress : Not at all  Social Connections: Moderately Integrated (05/06/2021)   Social Connection and Isolation Panel [NHANES]    Frequency of Communication with Friends and Family: More than three times a week    Frequency of Social Gatherings with Friends and Family: Twice a week    Attends Religious Services: More than  4 times per year    Active Member of [employer] or Organizations: Yes    Attends Archivist Meetings: Not on file    Marital Status: Widowed     Review of Systems  Constitutional:  Negative for appetite change and unexpected weight change.  HENT:  Negative for congestion and sinus pressure.   Respiratory:  Negative for cough, chest tightness and shortness of breath.   Cardiovascular:  Negative for chest pain, palpitations and leg swelling.  Gastrointestinal:  Negative for abdominal pain, diarrhea, nausea and vomiting.  Genitourinary:  Negative for difficulty urinating and dysuria.  Musculoskeletal:  Negative for joint swelling and myalgias.  Skin:  Negative for color change and rash.  Neurological:  Negative for dizziness, light-headedness and headaches.  Psychiatric/Behavioral:  Negative for agitation and dysphoric mood.        Objective:     BP 138/78   Pulse 80   Temp 98 F (36.7 C)   Resp 16   Ht 5' (1.524 m)   Wt 128 lb 3.2 oz (58.2 kg)   SpO2 97%   BMI 25.04 kg/m  Wt Readings from Last 3 Encounters:  04/06/22 128 lb 3.2 oz (58.2 kg)  02/12/22 130 lb (59 kg)  12/04/21 126 lb 12.8 oz (57.5 kg)    Physical Exam Vitals reviewed.  Constitutional:      General: She is not in acute distress.    Appearance: Normal appearance.  HENT:     Head: Normocephalic and atraumatic.     Right Ear: External ear normal.     Left Ear: External ear normal.  Eyes:     General: No scleral icterus.       Right eye: No discharge.        Left eye: No discharge.     Conjunctiva/sclera: Conjunctivae normal.  Neck:     Thyroid: No thyromegaly.  Cardiovascular:     Rate and Rhythm: Normal rate and regular rhythm.  Pulmonary:     Effort: No respiratory distress.     Breath sounds: Normal breath sounds. No wheezing.  Abdominal:     General: Bowel sounds are normal.     Palpations: Abdomen is soft.     Tenderness: There is no abdominal tenderness.  Musculoskeletal:         General: No swelling or tenderness.     Cervical back: Neck supple. No tenderness.  Lymphadenopathy:     Cervical: No cervical adenopathy.  Skin:    Findings: No erythema or rash.  Neurological:     Mental Status: She is alert.  Psychiatric:  Mood and Affect: Mood normal.        Behavior: Behavior normal.      Outpatient Encounter Medications as of 04/06/2022  Medication Sig   acetaminophen (TYLENOL) 325 MG tablet Take 650 mg by mouth every 6 (six) hours as needed for moderate pain or headache.   amiodarone (PACERONE) 100 MG tablet Take 1 tablet (100 mg total) by mouth daily.   apixaban (ELIQUIS) 2.5 MG TABS tablet Take 1 tablet (2.5 mg total) by mouth 2 (two) times daily.   benazepril (LOTENSIN) 40 MG tablet Take 1 tablet (40 mg total) by mouth 2 (two) times daily.   Calcium Carbonate-Vitamin D 600-400 MG-UNIT tablet Take 1 tablet by mouth in the morning.   felodipine (PLENDIL) 2.5 MG 24 hr tablet Take 1 tablet (2.5 mg total) by mouth daily.   furosemide (LASIX) 20 MG tablet Take 1 tablet (20 mg total) by mouth daily as needed.   Multiple Vitamin (MULTIVITAMIN WITH MINERALS) TABS tablet Take 1 tablet by mouth in the morning.   rosuvastatin (CRESTOR) 5 MG tablet Take 1 tablet (5 mg total) by mouth daily.   [DISCONTINUED] benazepril (LOTENSIN) 40 MG tablet Take 1 tablet (40 mg total) by mouth daily. (Patient taking differently: Take 40 mg by mouth 2 (two) times daily.)   [DISCONTINUED] Omega-3 Fatty Acids (FISH OIL) 1000 MG CAPS Take 1,000 mg by mouth every evening.   No facility-administered encounter medications on file as of 04/06/2022.     Lab Results  Component Value Date   WBC 4.3 03/31/2022   HGB 14.7 03/31/2022   HCT 43.5 03/31/2022   PLT 187.0 03/31/2022   GLUCOSE 119 (H) 03/31/2022   CHOL 183 03/31/2022   TRIG 83.0 03/31/2022   HDL 95.20 03/31/2022   LDLCALC 72 03/31/2022   ALT 17 03/31/2022   AST 19 03/31/2022   NA 139 03/31/2022   K 3.9 03/31/2022   CL  101 03/31/2022   CREATININE 1.05 03/31/2022   BUN 15 03/31/2022   CO2 28 03/31/2022   TSH 0.88 12/01/2021   HGBA1C 5.6 03/31/2022       Assessment & Plan:   Problem List Items Addressed This Visit     Atrial fibrillation [facility])    S/p cardioversion.  Appears to be in SR today.  Continues on eliquis and amiodarone.  Recently had dose of amiodarone decreased to '100mg'$ .  Resting heart rate has improved some.  Seeing Dr End. Follow.       Relevant Medications   benazepril (LOTENSIN) 40 MG tablet   Carcinoma in situ of breast    S/p breast lumpectomy 06/24/20.  Has seen Dr Bary Castilla.  Up to date with mammogram.       Essential hypertension - Primary    On metoprolol 12.'5mg'$  q day. Continue benazepril.  Back on bid dosing now.  Reviewed attached blood pressures - most readings 120-130/60-70s.  Follow pressures.  Follow metabolic panel.      Relevant Medications   benazepril (LOTENSIN) 40 MG tablet   Other Relevant Orders   Basic metabolic panel   History of breast cancer    Was being followed by Dr Rogue Bussing.  On no active treatment.  Has been released.  Has been seeing Dr Bary Castilla.  S/p left lumpectomy.  Mammogram 05/27/21 - [facility] I.  Recommended f/u with Dr Peyton Najjar in one year.        Hyperglycemia    Low carb diet and exercise.  Follow met b and a1c.  Relevant Orders   Hemoglobin A1c   Mixed hyperlipidemia    Continue crestor.  Low cholesterol diet and exercise.  Follow lipid panel and liver function tests.       Relevant Medications   benazepril (LOTENSIN) 40 MG tablet   Other Visit Diagnoses     Hyperlipidemia, unspecified hyperlipidemia type       Relevant Medications   benazepril (LOTENSIN) 40 MG tablet   Other Relevant Orders   Lipid panel   Hepatic function panel       Einar Pheasant, MD

## 2022-04-06 NOTE — Assessment & Plan Note (Signed)
Was being followed by Dr Rogue Bussing.  On no active treatment.  Has been released.  Has been seeing Dr Bary Castilla.  S/p left lumpectomy.  Mammogram 05/27/21 - Bir[facility] I.  Recommended f/u with Dr Peyton Najjar in one year.

## 2022-04-06 NOTE — Assessment & Plan Note (Signed)
Low carb diet and exercise.  Follow met b and a1c.  

## 2022-04-06 NOTE — Assessment & Plan Note (Addendum)
S/p breast lumpectomy 06/24/20.  Has seen Dr Bary Castilla.  Up to date with mammogram.

## 2022-04-06 NOTE — Assessment & Plan Note (Signed)
Continue crestor.  Low cholesterol diet and exercise. Follow lipid panel and liver function tests.   

## 2022-04-17 ENCOUNTER — Other Ambulatory Visit: Payer: Self-pay | Admitting: Internal Medicine

## 2022-04-21 ENCOUNTER — Other Ambulatory Visit: Payer: Self-pay | Admitting: General Surgery

## 2022-04-21 DIAGNOSIS — Z853 Personal history of malignant neoplasm of breast: Secondary | ICD-10-CM

## 2022-04-27 ENCOUNTER — Telehealth: Payer: Self-pay | Admitting: Internal Medicine

## 2022-04-27 NOTE — Telephone Encounter (Signed)
Left message for patient to call back and schedule Medicare Annual Wellness Visit (AWV) .   Please offer to do by telephone.  Left office number and my jabber [phone].  Last AWV:05/06/2021   Please schedule at anytime with Nurse Health Advisor.

## 2022-05-07 ENCOUNTER — Ambulatory Visit (INDEPENDENT_AMBULATORY_CARE_PROVIDER_SITE_OTHER): Payer: PPO

## 2022-05-07 VITALS — BP 124/60 | HR 55 | Ht 60.0 in | Wt 128.0 lb

## 2022-05-07 DIAGNOSIS — Z0000 Encounter for general adult medical examination without abnormal findings: Secondary | ICD-10-CM | POA: Diagnosis not present

## 2022-05-07 NOTE — Patient Instructions (Addendum)
Erin [name] , Thank you for taking time to come for your Medicare Wellness Visit. I appreciate your ongoing commitment to your health goals. Please review the following plan we discussed and let me know if I can assist you in the future.   These are the goals we discussed:  Goals       Patient Stated     I want to start playing "Bridge" game again (pt-stated)      Brain stimulating activities        This is a list of the screening recommended for you and due dates:  Health Maintenance  Topic Date Due   Mammogram  11/27/2021   COVID-19 Vaccine (6 - 2023-24 season) 05/23/2022*   DTaP/Tdap/Td vaccine (2 - Td or Tdap) 06/01/2022   Medicare Annual Wellness Visit  05/08/2023   Pneumonia Vaccine  Completed   Flu Shot  Completed   DEXA scan (bone density measurement)  Completed   Zoster (Shingles) Vaccine  Completed   HPV Vaccine  Aged Out  *Topic was postponed. The date shown is not the original due date.    Advanced directives: on file  Conditions/risks identified: none new  Next appointment: Follow up in one year for your annual wellness visit    Preventive Care 65 Years and Older, Female Preventive care refers to lifestyle choices and visits with your health care provider that can promote health and wellness. What does preventive care include? A yearly physical exam. This is also called an annual well check. Dental exams once or twice a year. Routine eye exams. Ask your health care provider how often you should have your eyes checked. Personal lifestyle choices, including: Daily care of your teeth and gums. Regular physical activity. Eating a healthy diet. Avoiding tobacco and drug use. Limiting alcohol use. Practicing safe sex. Taking low-dose aspirin every day. Taking vitamin and mineral supplements as recommended by your health care provider. What happens during an annual well check? The services and screenings done by your health care provider during your annual well  check will depend on your age, overall health, lifestyle risk factors, and family history of disease. Counseling  Your health care provider may ask you questions about your: Alcohol use. Tobacco use. Drug use. Emotional well-being. Home and relationship well-being. Sexual activity. Eating habits. History of falls. Memory and ability to understand (cognition). Work and work Statistician. Reproductive health. Screening  You may have the following tests or measurements: Height, weight, and BMI. Blood pressure. Lipid and cholesterol levels. These may be checked every 5 years, or more frequently if you are over 21 years old. Skin check. Lung cancer screening. You may have this screening every year starting at age 61 if you have a 30-pack-year history of smoking and currently smoke or have quit within the past 15 years. Fecal occult blood test (FOBT) of the stool. You may have this test every year starting at age 54. Flexible sigmoidoscopy or colonoscopy. You may have a sigmoidoscopy every 5 years or a colonoscopy every 10 years starting at age 28. Hepatitis C blood test. Hepatitis B blood test. Sexually transmitted disease (STD) testing. Diabetes screening. This is done by checking your blood sugar (glucose) after you have not eaten for a while (fasting). You may have this done every 1-3 years. Bone density scan. This is done to screen for osteoporosis. You may have this done starting at age 38. Mammogram. This may be done every 1-2 years. Talk to your health care provider about how often  you should have regular mammograms. Talk with your health care provider about your test results, treatment options, and if necessary, the need for more tests. Vaccines  Your health care provider may recommend certain vaccines, such as: Influenza vaccine. This is recommended every year. Tetanus, diphtheria, and acellular pertussis (Tdap, Td) vaccine. You may need a Td booster every 10 years. Zoster  vaccine. You may need this after age 63. Pneumococcal 13-valent conjugate (PCV13) vaccine. One dose is recommended after age 71. Pneumococcal polysaccharide (PPSV23) vaccine. One dose is recommended after age 74. Talk to your health care provider about which screenings and vaccines you need and how often you need them. This information is not intended to replace advice given to you by your health care provider. Make sure you discuss any questions you have with your health care provider. Document Released: 03/29/2015 Document Revised: 11/20/2015 Document Reviewed: 01/01/2015 Elsevier Interactive Patient Education  2017 [employer] Prevention in the Home Falls can cause injuries. They can happen to people of all ages. There are many things you can do to make your home safe and to help prevent falls. What can I do on the outside of my home? Regularly fix the edges of walkways and driveways and fix any cracks. Remove anything that might make you trip as you walk through a door, such as a raised step or threshold. Trim any bushes or trees on the path to your home. Use bright outdoor lighting. Clear any walking paths of anything that might make someone trip, such as rocks or tools. Regularly check to see if handrails are loose or broken. Make sure that both sides of any steps have handrails. Any raised decks and porches should have guardrails on the edges. Have any leaves, snow, or ice cleared regularly. Use sand or salt on walking paths during winter. Clean up any spills in your garage right away. This includes oil or grease spills. What can I do in the bathroom? Use night lights. Install grab bars by the toilet and in the tub and shower. Do not use towel bars as grab bars. Use non-skid mats or decals in the tub or shower. If you need to sit down in the shower, use a plastic, non-slip stool. Keep the floor dry. Clean up any water that spills on the floor as soon as it happens. Remove  soap buildup in the tub or shower regularly. Attach bath mats securely with double-sided non-slip rug tape. Do not have throw rugs and other things on the floor that can make you trip. What can I do in the bedroom? Use night lights. Make sure that you have a light by your bed that is easy to reach. Do not use any sheets or blankets that are too big for your bed. They should not hang down onto the floor. Have a firm chair that has side arms. You can use this for support while you get dressed. Do not have throw rugs and other things on the floor that can make you trip. What can I do in the kitchen? Clean up any spills right away. Avoid walking on wet floors. Keep items that you use a lot in easy-to-reach places. If you need to reach something above you, use a strong step stool that has a grab bar. Keep electrical cords out of the way. Do not use floor polish or wax that makes floors slippery. If you must use wax, use non-skid floor wax. Do not have throw rugs and other things on  the floor that can make you trip. What can I do with my stairs? Do not leave any items on the stairs. Make sure that there are handrails on both sides of the stairs and use them. Fix handrails that are broken or loose. Make sure that handrails are as long as the stairways. Check any carpeting to make sure that it is firmly attached to the stairs. Fix any carpet that is loose or worn. Avoid having throw rugs at the top or bottom of the stairs. If you do have throw rugs, attach them to the floor with carpet tape. Make sure that you have a light switch at the top of the stairs and the bottom of the stairs. If you do not have them, ask someone to add them for you. What else can I do to help prevent falls? Wear shoes that: Do not have high heels. Have rubber bottoms. Are comfortable and fit you well. Are closed at the toe. Do not wear sandals. If you use a stepladder: Make sure that it is fully opened. Do not climb a  closed stepladder. Make sure that both sides of the stepladder are locked into place. Ask someone to hold it for you, if possible. Clearly mark and make sure that you can see: Any grab bars or handrails. First and last steps. Where the edge of each step is. Use tools that help you move around (mobility aids) if they are needed. These include: Canes. Walkers. Scooters. Crutches. Turn on the lights when you go into a dark area. Replace any light bulbs as soon as they burn out. Set up your furniture so you have a clear path. Avoid moving your furniture around. If any of your floors are uneven, fix them. If there are any pets around you, be aware of where they are. Review your medicines with your doctor. Some medicines can make you feel dizzy. This can increase your chance of falling. Ask your doctor what other things that you can do to help prevent falls. This information is not intended to replace advice given to you by your health care provider. Make sure you discuss any questions you have with your health care provider. Document Released: 12/27/2008 Document Revised: 08/08/2015 Document Reviewed: 04/06/2014 Elsevier Interactive Patient Education  2017 [employer].

## 2022-05-07 NOTE — Progress Notes (Signed)
Subjective:   Erin [name] is a 89 y.o. female who presents for Medicare Annual (Subsequent) preventive examination.  Review of Systems    No ROS.  Medicare Wellness Virtual Visit.  Visual/audio telehealth visit, UTA vital signs.   See social history for additional risk factors.   Cardiac Risk Factors include: advanced age (>22mn, >>22women);hypertension     Objective:    Today's Vitals   05/07/22 0859  BP: 124/60  Pulse: (!) 55  Weight: 128 lb (58.1 kg)   Body mass index is 25 kg/m.     05/07/2022    9:08 AM 09/11/2021    9:03 AM 05/06/2021    2:13 PM 03/31/2021   10:21 AM 03/13/2021   10:52 AM 09/09/2020   10:17 AM 07/10/2020    1:19 PM  Advanced Directives  Does Patient Have a Medical Advance Directive? Yes Yes Yes No No Yes Yes  Type of AParamedicof [place]ov[place]Living will [place]uncan FallsLiving will HFort SumnerLiving will   [place] will [place]Living will  Does patient want to make changes to medical advance directive? No - Patient declined  No - Patient declined      Copy of [place] Chart? Yes - validated most recent copy scanned in chart (See row information)  Yes - validated most recent copy scanned in chart (See row information)    No - copy requested  Would patient like information on creating a medical advance directive?    No - Patient declined No - Patient declined      Current Medications (verified) Outpatient Encounter Medications as of 05/07/2022  Medication Sig   acetaminophen (TYLENOL) 325 MG tablet Take 650 mg by mouth every 6 (six) hours as needed for moderate pain or headache.   amiodarone (PACERONE) 100 MG tablet Take 1 tablet (100 mg total) by mouth daily.   apixaban (ELIQUIS) 2.5 MG TABS tablet Take 1 tablet (2.5 mg total) by mouth 2 (two) times daily.   benazepril (LOTENSIN) 40 MG tablet Take 1 tablet (40 mg total) by mouth  2 (two) times daily.   Calcium Carbonate-Vitamin D 600-400 MG-UNIT tablet Take 1 tablet by mouth in the morning.   felodipine (PLENDIL) 2.5 MG 24 hr tablet Take 1 tablet (2.5 mg total) by mouth daily.   furosemide (LASIX) 20 MG tablet Take 1 tablet (20 mg total) by mouth daily as needed.   Multiple Vitamin (MULTIVITAMIN WITH MINERALS) TABS tablet Take 1 tablet by mouth in the morning.   rosuvastatin (CRESTOR) 5 MG tablet Take 1 tablet (5 mg total) by mouth daily.   No facility-administered encounter medications on file as of 05/07/2022.    Allergies (verified) Erythromycin and Minocin [minocycline hcl]   History: Past Medical History:  Diagnosis Date   A-fib ([place]    Breast cancer ([place]    Cancer ([facility]    melanoma - arm left   Chest pain 07/13/2016   History of colon polyps    Hypercholesterolemia    Hypertension    Personal history of radiation therapy    Past Surgical History:  Procedure Laterality Date   APPENDECTOMY  1946   BREAST BIOPSY Left 06/04/2020   uKoreabx, vision marker, positive   BREAST LUMPECTOMY Left 06/24/2020   BREAST LUMPECTOMY WITH SENTINEL LYMPH NODE BIOPSY Left 06/24/2020   Procedure: BREAST LUMPECTOMY WITH SENTINEL LYMPH NODE BIOPSY;  Surgeon: BRobert Bellow MD;  [facility];  Service: General;  Laterality: Left;   CARDIOVERSION N/A 07/30/2020   Procedure: CARDIOVERSION;  Surgeon: Nelva Bush, MD;  Location: [facility] ORS;  Service: Cardiovascular;  Laterality: N/A;   CARDIOVERSION N/A 09/03/2020   Procedure: CARDIOVERSION;  Surgeon: Nelva Bush, MD;  Location: [facility] ORS;  Service: Cardiovascular;  Laterality: N/A;   CATARACT EXTRACTION Bilateral    DILATION AND CURETTAGE OF UTERUS     LAMINECTOMY  2000   release of foraminal stenosis  2003   Family History  Problem Relation Age of Onset   Hypertension Mother    Hypertension Sister    Multiple sclerosis Sister        died 63   Heart disease Brother        S/P CABG   Breast  cancer Maternal Aunt    Social History   Socioeconomic History   Marital status: Widowed    Spouse name: Not on file   Number of children: 4   Years of education: Not on file   Highest education level: Not on file  Occupational History   Occupation: retired  Tobacco Use   Smoking status: Former   Smokeless tobacco: Never  Scientific laboratory technician Use: Never used  Substance and Sexual Activity   Alcohol use: Yes    Alcohol/week: 0.0 standard drinks of alcohol    Comment: occassionally   Drug use: No   Sexual activity: Never  Other Topics Concern   Not on file  Social History Narrative   Not on file   Social Determinants of Health   Financial Resource Strain: Low Risk  (05/07/2022)   Overall Financial Resource Strain (CARDIA)    Difficulty of Paying Living Expenses: Not hard at all  Food Insecurity: No Food Insecurity (05/07/2022)   Hunger Vital Sign    Worried About Running Out of Food in the Last Year: Never true    Ran Out of Food in the Last Year: Never true  Transportation Needs: No Transportation Needs (05/07/2022)   PRAPARE - Hydrologist (Medical): No    Lack of Transportation (Non-Medical): No  Physical Activity: Sufficiently Active (05/07/2022)   Exercise Vital Sign    Days of Exercise per Week: 5 days    Minutes of Exercise per Session: 30 min  Stress: No Stress Concern Present (05/07/2022)   [place]    Feeling of Stress : Not at all  Social Connections: Moderately Integrated (05/07/2022)   Social Connection and Isolation Panel [NHANES]    Frequency of Communication with Friends and Family: More than three times a week    Frequency of Social Gatherings with Friends and Family: Twice a week    Attends Religious Services: More than 4 times per year    Active Member of [employer] or Organizations: Yes    Attends Archivist Meetings: Not on file    Marital Status:  Widowed    Tobacco Counseling Counseling given: Not Answered   Clinical Intake:  Pre-visit preparation completed: Yes        Diabetes: No  How often do you need to have someone help you when you read instructions, pamphlets, or other written materials from your doctor or pharmacy?: 1 - Never    Interpreter Needed?: No    Activities of Daily Living    05/07/2022    9:00 AM  In your present state of health, do you have any difficulty performing the following  activities:  Hearing? 1  Comment Hearing aids  Vision? 0  Difficulty concentrating or making decisions? 0  Walking or climbing stairs? 0  Dressing or bathing? 0  Doing errands, shopping? 0  Preparing Food and eating ? N  Using the Toilet? N  In the past six months, have you accidently leaked urine? N  Do you have problems with loss of bowel control? N  Managing your Medications? N  Managing your Finances? N  Housekeeping or managing your Housekeeping? Y  Comment Maid assist once monthly    Patient Care Team: Einar Pheasant, MD as PCP - General (Internal Medicine) End, Harrell Gave, MD as PCP - Cardiology (Cardiology) Theodore Demark, RN (Inactive) as Oncology Nurse Navigator Noreene Filbert, MD as Consulting Physician (Radiation Oncology) Cammie Sickle, MD as Consulting Physician (Oncology) Bary Castilla Forest Gleason, MD as Consulting Physician (General Surgery) Emmaline Kluver., MD as Consulting Physician (Rheumatology)  Indicate any recent Medical Services you may have received from other than [facility] providers in the past year (date may be approximate).     Assessment:   This is a routine wellness examination for Erin [name].  I connected with  [name] on 05/07/22 by a audio enabled telemedicine application and verified that I am speaking with the correct person using two identifiers.  Patient Location: Home  Provider Location: Office/Clinic  I discussed the limitations of evaluation and  management by telemedicine. The patient expressed understanding and agreed to proceed.   Hearing/Vision screen Hearing Screening - Comments:: Hearing aids Vision Screening - Comments:: Wears corrective lenses when reading Cataract extraction, bilateral They have seen their ophthalmologist in the last 12 months.   Dietary issues and exercise activities discussed: Current Exercise Habits: Home exercise routine, Type of exercise: walking, Time (Minutes): 35, Frequency (Times/Week): 5, Weekly Exercise (Minutes/Week): 175, Intensity: Mild Regular diet   Goals Addressed               This Visit's Progress     Patient Stated     I want to start playing "Bridge" game again (pt-stated)   On track     Brain stimulating activities       Depression Screen    05/07/2022    9:01 AM 04/06/2022    7:33 AM 12/04/2021    9:05 AM 07/30/2021    8:31 AM 07/29/2021    3:56 PM 05/06/2021    2:12 PM 05/01/2021   10:34 AM  PHQ 2/9 Scores  PHQ - 2 Score 0 0 0 0 0 0 0  PHQ- 9 Score 0 1         Fall Risk    05/07/2022    9:01 AM 04/06/2022    7:33 AM 12/04/2021    9:04 AM 07/30/2021    8:30 AM 07/29/2021    3:56 PM  Fall Risk   Falls in the past year? 0 0 1 1 0  Number falls in past yr: 0 0 0 0   Injury with Fall? 0 0 0 0   Risk for fall due to :  History of fall(s) Impaired balance/gait Impaired balance/gait;Impaired mobility;History of fall(s) No Fall Risks  Follow up Falls evaluation completed;Falls prevention discussed Falls evaluation completed Falls evaluation completed Falls evaluation completed Falls evaluation completed    FALL RISK PREVENTION PERTAINING TO THE HOME: Home free of loose throw rugs in walkways, pet beds, electrical cords, etc? Yes  Adequate lighting in your home to reduce risk of falls? Yes   ASSISTIVE  DEVICES UTILIZED TO PREVENT FALLS: Life alert? Yes  Use of a cane, walker or w/c? Yes , as needed Grab bars in the bathroom? Yes  Shower chair or bench in shower? Not  in use Comfort chair height toilet? No   TIMED UP AND GO: Was the test performed? No .   Cognitive Function:    04/23/2015    9:47 AM  MMSE - Mini Mental State Exam  Orientation to time 5  Orientation to Place 5  Registration 3  Attention/ Calculation 5  Recall 3  Language- name 2 objects 2  Language- repeat 1  Language- follow 3 step command 3  Language- read & follow direction 1  Write a sentence 1  Copy design 1  Total score 30        05/07/2022    9:05 AM 04/30/2020   10:29 AM 04/28/2019    9:51 AM 04/26/2018    9:23 AM 04/23/2017   10:48 AM  6CIT Screen  What Year? 0 points 0 points 0 points 0 points 0 points  What month? 0 points 0 points 0 points 0 points 0 points  What time? 0 points 0 points 0 points 0 points 0 points  Count back from 20 0 points 0 points 0 points 0 points 0 points  Months in reverse 0 points 0 points 0 points 0 points 0 points  Repeat phrase 0 points  0 points 0 points 0 points  Total Score 0 points  0 points 0 points 0 points    Immunizations Immunization History  Administered Date(s) Administered   Fluad Quad(high Dose 65+) 11/25/2018, 12/16/2021   Influenza Split 12/05/2013   Influenza, High Dose Seasonal PF 11/24/2016, 12/02/2017, 12/16/2020   Influenza,inj,Quad PF,6+ Mos 11/30/2012   Influenza-Unspecified 11/14/2013, 12/11/2014, 12/06/2015   PFIZER(Purple Top)SARS-COV-2 Vaccination 04/06/2019, 04/27/2019, 01/04/2020, 07/16/2020   Pfizer Covid-19 Vaccine Bivalent Booster 67yr & up 01/14/2021   Pneumococcal Conjugate-13 10/24/2014   Pneumococcal Polysaccharide-23 11/13/2015   Rsv, Bivalent, Protein Subunit Rsvpref,pf (Evans Lance 02/26/2022   Tdap 05/31/2012   Zoster Recombinat (Shingrix) 12/23/2016, 03/26/2017   Zoster, Live 02/14/2011   Screening Tests Health Maintenance  Topic Date Due   MAMMOGRAM  11/27/2021   COVID-19 Vaccine (6 - 2023-24 season) 05/23/2022 (Originally 11/14/2021)   DTaP/Tdap/Td (2 - Td or Tdap) 06/01/2022    Medicare Annual Wellness (AWV)  05/08/2023   Pneumonia Vaccine 89 Years old  Completed   INFLUENZA VACCINE  Completed   DEXA SCAN  Completed   Zoster Vaccines- Shingrix  Completed   HPV VACCINES  Aged Out    Health Maintenance Health Maintenance Due  Topic Date Due   MAMMOGRAM  11/27/2021   Mammogram- scheduled 06/01/22.  Lung Cancer Screening: (Low Dose CT Chest recommended if Age 87-80years, 30 pack-year currently smoking OR have quit w/in 15years.) does not qualify.   Hepatitis C Screening: does not qualify.  Vision Screening: Recommended annual ophthalmology exams for early detection of glaucoma and other disorders of the eye.  Dental Screening: Recommended annual dental exams for proper oral hygiene.  Community Resource Referral / Chronic Care Management: CRR required this visit?  No   CCM required this visit?  No      Plan:     I have personally reviewed and noted the following in the patient's chart:   Medical and social history Use of alcohol, tobacco or illicit drugs  Current medications and supplements including opioid prescriptions. Patient is not currently taking opioid prescriptions. Functional ability and status  Nutritional status Physical activity Advanced directives List of other physicians Hospitalizations, surgeries, and ER visits in previous 12 months Vitals Screenings to include cognitive, depression, and falls Referrals and appointments  In addition, I have reviewed and discussed with patient certain preventive protocols, quality metrics, and best practice recommendations. A written personalized care plan for preventive services as well as general preventive health recommendations were provided to patient.     Leta Jungling, LPN   [id number]

## 2022-05-27 ENCOUNTER — Ambulatory Visit (INDEPENDENT_AMBULATORY_CARE_PROVIDER_SITE_OTHER): Payer: PPO

## 2022-05-27 ENCOUNTER — Telehealth: Payer: Self-pay | Admitting: Internal Medicine

## 2022-05-27 VITALS — BP 126/80 | HR 86

## 2022-05-27 DIAGNOSIS — R000 Tachycardia, unspecified: Secondary | ICD-10-CM | POA: Diagnosis not present

## 2022-05-27 NOTE — Progress Notes (Signed)
Patient walked in because her heart rate was showing 118 on her watch and she says this is high for her. She was concerned that she may be in a-fib. Pt denies any acute symptoms and appeared to be in no acute distress. Vital signs stable. EKG ok. She had already left her cardiologist a message. Advised per Dr Derrel Nip (doc of day)- ok for patient to go home and wait for response from cardiology.

## 2022-05-27 NOTE — Telephone Encounter (Signed)
STAT if HR is under 50 or over 120 (normal HR is 60-100 beats per minute)  What is your heart rate? 118  Do you have a log of your heart rate readings (document readings)? no  Do you have any other symptoms? No   Pt would like a callback due to her stating this is unusual for her. Please advise.

## 2022-05-29 NOTE — Telephone Encounter (Signed)
Left voicemail message to call back  

## 2022-05-29 NOTE — Telephone Encounter (Signed)
Patient called in with reports of elevated heart rates. She has history of Afib but she reports medication has managed that for her. She reports that the other day when it got fast she stopped by her PCP office and the EKG that they had done was normal. She has had episodes of where it got up to the 120's but since then it has been better. Offered appointment to be seen here in our office and she was appreciative for that time. She verbalized to call back if she should have any further questions or concerns. No further needs at this time.

## 2022-06-01 ENCOUNTER — Ambulatory Visit
Admission: RE | Admit: 2022-06-01 | Discharge: 2022-06-01 | Disposition: A | Discharge status: Home / Self Care | Payer: PPO | Source: Ambulatory Visit | Attending: General Surgery | Admitting: General Surgery

## 2022-06-01 DIAGNOSIS — Z853 Personal history of malignant neoplasm of breast: Secondary | ICD-10-CM | POA: Insufficient documentation

## 2022-06-09 DIAGNOSIS — Z853 Personal history of malignant neoplasm of breast: Secondary | ICD-10-CM | POA: Diagnosis not present

## 2022-06-09 DIAGNOSIS — Z170 Estrogen receptor positive status [ER+]: Secondary | ICD-10-CM | POA: Diagnosis not present

## 2022-06-09 DIAGNOSIS — C50412 Malignant neoplasm of upper-outer quadrant of left female breast: Secondary | ICD-10-CM | POA: Diagnosis not present

## 2022-06-30 ENCOUNTER — Ambulatory Visit: Payer: PPO | Admitting: Medical

## 2022-06-30 DIAGNOSIS — H903 Sensorineural hearing loss, bilateral: Secondary | ICD-10-CM | POA: Diagnosis not present

## 2022-07-31 ENCOUNTER — Other Ambulatory Visit (INDEPENDENT_AMBULATORY_CARE_PROVIDER_SITE_OTHER): Payer: PPO

## 2022-07-31 DIAGNOSIS — E785 Hyperlipidemia, unspecified: Secondary | ICD-10-CM | POA: Diagnosis not present

## 2022-07-31 DIAGNOSIS — R739 Hyperglycemia, unspecified: Secondary | ICD-10-CM

## 2022-07-31 DIAGNOSIS — I10 Essential (primary) hypertension: Secondary | ICD-10-CM

## 2022-07-31 LAB — LIPID PANEL
Cholesterol: 175 mg/dL (ref 0–200)
HDL: 93.9 mg/dL (ref >39.00)
LDL Cholesterol: 65 mg/dL (ref 0–99)
NonHDL: 81.22
Total CHOL/HDL Ratio: 2
Triglycerides: 81 mg/dL (ref 0.0–149.0)
VLDL: 16.2 mg/dL (ref 0.0–40.0)

## 2022-07-31 LAB — HEPATIC FUNCTION PANEL
ALT: 14 U/L (ref 0–35)
AST: 19 U/L (ref 0–37)
Albumin: 4.2 g/dL (ref 3.5–5.2)
Alkaline Phosphatase: 61 U/L (ref 39–117)
Bilirubin, Direct: 0.2 mg/dL (ref 0.0–0.3)
Total Bilirubin: 0.8 mg/dL (ref 0.2–1.2)
Total Protein: 6.6 g/dL (ref 6.0–8.3)

## 2022-07-31 LAB — BASIC METABOLIC PANEL
BUN: 18 mg/dL (ref 6–23)
CO2: 29 mEq/L (ref 19–32)
Calcium: 9.9 mg/dL (ref 8.4–10.5)
Chloride: 101 mEq/L (ref 96–112)
Creatinine, Ser: 0.98 mg/dL (ref 0.40–1.20)
GFR: 50.87 mL/min — ABNORMAL LOW (ref >60.00)
Glucose, Bld: 96 mg/dL (ref 70–99)
Potassium: 4 mEq/L (ref 3.5–5.1)
Sodium: 137 mEq/L (ref 135–145)

## 2022-07-31 LAB — HEMOGLOBIN A1C: Hgb A1c MFr Bld: 5.5 % (ref 4.6–6.5)

## 2022-08-06 ENCOUNTER — Ambulatory Visit (INDEPENDENT_AMBULATORY_CARE_PROVIDER_SITE_OTHER): Payer: PPO | Admitting: Internal Medicine

## 2022-08-06 VITALS — BP 128/68 | HR 66 | Temp 97.8°F | Ht 60.0 in | Wt 126.8 lb

## 2022-08-06 DIAGNOSIS — M8580 Other specified disorders of bone density and structure, unspecified site: Secondary | ICD-10-CM

## 2022-08-06 DIAGNOSIS — I10 Essential (primary) hypertension: Secondary | ICD-10-CM

## 2022-08-06 DIAGNOSIS — E785 Hyperlipidemia, unspecified: Secondary | ICD-10-CM | POA: Diagnosis not present

## 2022-08-06 DIAGNOSIS — Z0000 Encounter for general adult medical examination without abnormal findings: Secondary | ICD-10-CM

## 2022-08-06 DIAGNOSIS — R739 Hyperglycemia, unspecified: Secondary | ICD-10-CM | POA: Diagnosis not present

## 2022-08-06 DIAGNOSIS — Z85820 Personal history of malignant melanoma of skin: Secondary | ICD-10-CM | POA: Diagnosis not present

## 2022-08-06 DIAGNOSIS — I4819 Other persistent atrial fibrillation: Secondary | ICD-10-CM

## 2022-08-06 DIAGNOSIS — E782 Mixed hyperlipidemia: Secondary | ICD-10-CM

## 2022-08-06 DIAGNOSIS — E2839 Other primary ovarian failure: Secondary | ICD-10-CM | POA: Diagnosis not present

## 2022-08-06 MED ORDER — FUROSEMIDE 20 MG PO TABS: 20.0000 mg | ORAL_TABLET | Freq: Every day | ORAL | 0 refills | Status: DC | PRN

## 2022-08-06 MED ORDER — FELODIPINE ER 2.5 MG PO TB24: 2.5000 mg | ORAL_TABLET | Freq: Every day | ORAL | 3 refills | Status: DC

## 2022-08-06 NOTE — Assessment & Plan Note (Signed)
Physical today 08/06/22.   She had a diagnostic mammogram on 06/01/2022. There showed expected postoperative changes without any concerning area of malignancy.

## 2022-08-06 NOTE — Patient Instructions (Signed)
Do not take calcium or any calcium supplements the day before the bone density.

## 2022-08-06 NOTE — Progress Notes (Signed)
Subjective:    Patient ID: Erin [name], female    DOB: Jan 03, 1931, 89 y.o.   MRN: [id number]  Patient here for  Chief Complaint  Patient presents with   Annual Exam    HPI Here for physical exam. History of afib s/p cardioversion. Followed by Dr End.  Continues on amiodarone and eliquis.  She underwent partial mastectomy with sentinel node biopsy on 06/24/2020. 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 All margins negative for invasive carcinoma. Closest margin was 3 mm. The mass was ER positive, PR positive, HER2 negative. She completed radiation therapy. She deferred endocrine therapy. She had a diagnostic mammogram on 06/01/2022. There showed expected postoperative changes without any concerning area of malignancy. Saw Dr Maia Plan 06/09/22.  Recommended - continue screening mammogram - yearly.  Plans to start getting mammogram through our office.  She reports she is doing well.  No chest pain or sob reported.  No abdominal pain or bowel change reported.  Agreeable to f/u bone density.  Has f/u with Dr End next week.     Past Medical History:  Diagnosis Date   A-fib [facility])    Breast cancer (HCC)    Cancer (HCC)    melanoma - arm left   Chest pain 07/13/2016   History of colon polyps    Hypercholesterolemia    Hypertension    Personal history of radiation therapy    Past Surgical History:  Procedure Laterality Date   APPENDECTOMY  1946   BREAST BIOPSY Left 06/04/2020   [place] bx, vision marker, positive   BREAST LUMPECTOMY Left 06/24/2020   BREAST LUMPECTOMY WITH SENTINEL LYMPH NODE BIOPSY Left 06/24/2020   Procedure: BREAST LUMPECTOMY WITH SENTINEL LYMPH NODE BIOPSY;  Surgeon: Earline Mayotte, MD;  Location: [facility] ORS;  Service: General;  Laterality: Left;   CARDIOVERSION N/A 07/30/2020   Procedure: CARDIOVERSION;  Surgeon: Yvonne Kendall, MD;  Location: [facility] ORS;  Service: Cardiovascular;  Laterality: N/A;    CARDIOVERSION N/A 09/03/2020   Procedure: CARDIOVERSION;  Surgeon: Yvonne Kendall, MD;  Location: [facility] ORS;  Service: Cardiovascular;  Laterality: N/A;   CATARACT EXTRACTION Bilateral    DILATION AND CURETTAGE OF UTERUS     LAMINECTOMY  2000   release of foraminal stenosis  2003   Family History  Problem Relation Age of Onset   Hypertension Mother    Hypertension Sister    Multiple sclerosis Sister        died 64   Heart disease Brother        S/P CABG   Breast cancer Maternal Aunt    Social History   Socioeconomic History   Marital status: Widowed    Spouse name: Not on file   Number of children: 4   Years of education: Not on file   Highest education level: Not on file  Occupational History   Occupation: retired  Tobacco Use   Smoking status: Former   Smokeless tobacco: Never  Building services engineer Use: Never used  Substance and Sexual Activity   Alcohol use: Yes    Alcohol/week: 0.0 standard drinks of alcohol    Comment: occassionally   Drug use: No   Sexual activity: Never  Other Topics Concern   Not on file  Social History Narrative   Not on file   Social Determinants of Health   Financial Resource Strain: Low Risk  (05/07/2022)   Overall Financial Resource Strain (CARDIA)  Difficulty of Paying Living Expenses: Not hard at all  Food Insecurity: No Food Insecurity (05/07/2022)   Hunger Vital Sign    Worried About Running Out of Food in the Last Year: Never true    Ran Out of Food in the Last Year: Never true  Transportation Needs: No Transportation Needs (05/07/2022)   PRAPARE - Administrator, Civil Service (Medical): No    Lack of Transportation (Non-Medical): No  Physical Activity: Sufficiently Active (05/07/2022)   Exercise Vital Sign    Days of Exercise per Week: 5 days    Minutes of Exercise per Session: 30 min  Stress: No Stress Concern Present (05/07/2022)   [employer] of Occupational Health - Occupational Stress Questionnaire     Feeling of Stress : Not at all  Social Connections: Moderately Integrated (05/07/2022)   Social Connection and Isolation Panel [NHANES]    Frequency of Communication with Friends and Family: More than three times a week    Frequency of Social Gatherings with Friends and Family: Twice a week    Attends Religious Services: More than 4 times per year    Active Member of [employer] or Organizations: Yes    Attends Banker Meetings: Not on file    Marital Status: Widowed     Review of Systems  Constitutional:  Negative for appetite change and unexpected weight change.  HENT:  Negative for congestion, sinus pressure and sore throat.   Eyes:  Negative for pain and visual disturbance.  Respiratory:  Negative for cough, chest tightness and shortness of breath.   Cardiovascular:  Negative for chest pain, palpitations and leg swelling.  Gastrointestinal:  Negative for abdominal pain, diarrhea, nausea and vomiting.  Genitourinary:  Negative for difficulty urinating and dysuria.  Musculoskeletal:  Negative for joint swelling and myalgias.  Skin:  Negative for color change and rash.  Neurological:  Negative for dizziness and headaches.  Hematological:  Negative for adenopathy. Does not bruise/bleed easily.  Psychiatric/Behavioral:  Negative for agitation and dysphoric mood.        Objective:     BP 128/68 (BP Location: Left Arm, Patient Position: Sitting, Cuff Size: Normal)   Pulse 66   Temp 97.8 F (36.6 C) (Oral)   Ht 5' (1.524 m)   Wt 126 lb 12.8 oz (57.5 kg)   SpO2 97%   BMI 24.76 kg/m  Wt Readings from Last 3 Encounters:  08/14/22 127 lb 8 oz (57.8 kg)  08/06/22 126 lb 12.8 oz (57.5 kg)  05/07/22 128 lb (58.1 kg)    Physical Exam Vitals reviewed.  Constitutional:      General: She is not in acute distress.    Appearance: Normal appearance. She is well-developed.  HENT:     Head: Normocephalic and atraumatic.     Right Ear: External ear normal.     Left Ear:  External ear normal.  Eyes:     General: No scleral icterus.       Right eye: No discharge.        Left eye: No discharge.     Conjunctiva/sclera: Conjunctivae normal.  Neck:     Thyroid: No thyromegaly.  Cardiovascular:     Rate and Rhythm: Normal rate and regular rhythm.  Pulmonary:     Effort: No tachypnea, accessory muscle usage or respiratory distress.     Breath sounds: Normal breath sounds. No decreased breath sounds or wheezing.  Chest:  Breasts:    Right: No inverted nipple, mass, nipple  discharge or tenderness (no axillary adenopathy).     Left: No inverted nipple, mass, nipple discharge or tenderness (no axilarry adenopathy).  Abdominal:     General: Bowel sounds are normal.     Palpations: Abdomen is soft.     Tenderness: There is no abdominal tenderness.  Musculoskeletal:        General: No swelling or tenderness.     Cervical back: Neck supple.  Lymphadenopathy:     Cervical: No cervical adenopathy.  Skin:    Findings: No erythema or rash.  Neurological:     Mental Status: She is alert and oriented to person, place, and time.  Psychiatric:        Mood and Affect: Mood normal.        Behavior: Behavior normal.      Outpatient Encounter Medications as of 08/06/2022  Medication Sig   acetaminophen (TYLENOL) 325 MG tablet Take 650 mg by mouth every 6 (six) hours as needed for moderate pain or headache.   benazepril (LOTENSIN) 40 MG tablet Take 1 tablet (40 mg total) by mouth 2 (two) times daily.   Calcium Carbonate-Vitamin D 600-400 MG-UNIT tablet Take 1 tablet by mouth in the morning.   Multiple Vitamin (MULTIVITAMIN WITH MINERALS) TABS tablet Take 1 tablet by mouth in the morning.   rosuvastatin (CRESTOR) 5 MG tablet Take 1 tablet (5 mg total) by mouth daily.   [DISCONTINUED] amiodarone (PACERONE) 100 MG tablet Take 1 tablet (100 mg total) by mouth daily.   [DISCONTINUED] apixaban (ELIQUIS) 2.5 MG TABS tablet Take 1 tablet (2.5 mg total) by mouth 2 (two) times  daily.   [DISCONTINUED] felodipine (PLENDIL) 2.5 MG 24 hr tablet Take 1 tablet (2.5 mg total) by mouth daily.   [DISCONTINUED] furosemide (LASIX) 20 MG tablet Take 1 tablet (20 mg total) by mouth daily as needed.   felodipine (PLENDIL) 2.5 MG 24 hr tablet Take 1 tablet (2.5 mg total) by mouth daily.   furosemide (LASIX) 20 MG tablet Take 1 tablet (20 mg total) by mouth daily as needed.   No facility-administered encounter medications on file as of 08/06/2022.     Lab Results  Component Value Date   WBC 4.3 03/31/2022   HGB 14.7 03/31/2022   HCT 43.5 03/31/2022   PLT 187.0 03/31/2022   GLUCOSE 96 07/31/2022   CHOL 175 07/31/2022   TRIG 81.0 07/31/2022   HDL 93.90 07/31/2022   LDLCALC 65 07/31/2022   ALT 14 07/31/2022   AST 19 07/31/2022   NA 137 07/31/2022   K 4.0 07/31/2022   CL 101 07/31/2022   CREATININE 0.98 07/31/2022   BUN 18 07/31/2022   CO2 29 07/31/2022   TSH 0.88 12/01/2021   HGBA1C 5.5 07/31/2022    MM DIAG BREAST TOMO BILATERAL  Result Date: 06/01/2022 CLINICAL DATA:  89 year old female presenting for annual exam. History of left breast cancer status post lumpectomy in 2022. EXAM: DIGITAL DIAGNOSTIC BILATERAL MAMMOGRAM WITH TOMOSYNTHESIS TECHNIQUE: Bilateral digital diagnostic mammography and breast tomosynthesis was performed. COMPARISON:  Previous exam(s). ACR Breast Density Category b: There are scattered areas of fibroglandular density. FINDINGS: Right breast: No suspicious mass, distortion, or microcalcifications are identified to suggest presence of malignancy. Left breast: A spot 2D magnification view of the lumpectomy site was performed in addition to standard views. There are benign postsurgical changes. No suspicious mass, distortion, or microcalcifications are identified to suggest presence of malignancy. IMPRESSION: Benign postsurgical changes in the left breast. No mammographic evidence of malignancy in the  right breast RECOMMENDATION: As the patient is now  over 2 years out from her lumpectomy, she may return to annual screening mammography in 1 year. Given her history of breast cancer, she remains eligible for annual diagnostic mammography, if preferred. I have discussed the findings and recommendations with the patient. If applicable, a reminder letter will be sent to the patient regarding the next appointment. BI-RADS CATEGORY  2: Benign. Electronically Signed   By: Emmaline Kluver M.D.   On: 06/01/2022 13:12      Assessment & Plan:  Routine general medical examination at a health care facility  Hyperlipidemia, unspecified hyperlipidemia type -     Lipid panel; Future -     Hepatic function panel; Future  Hyperglycemia Assessment & Plan: Low carb diet and exercise.  Follow met b and a1c.   Orders: -     Hemoglobin A1c; Future  Essential hypertension Assessment & Plan: Continue benazepril.  Back on bid dosing now.  Reviewed attached blood pressures - most readings 120-130/60-70s.  Follow pressures.  Follow metabolic panel.  Orders: -     Basic metabolic panel; Future -     TSH; Future  Health care maintenance Assessment & Plan: Physical today 08/06/22.   She had a diagnostic mammogram on 06/01/2022. There showed expected postoperative changes without any concerning area of malignancy.   Estrogen deficiency -     DG Bone Density; Future  Persistent atrial fibrillation [facility]) Assessment & Plan: S/p cardioversion.  Appears to be in SR today.  Continues on eliquis and amiodarone.  Recently had dose of amiodarone decreased to 100mg .  Resting heart rate has improved.  Seeing Dr End. Follow.    History of melanoma Assessment & Plan: Followed by dermatology.    Mixed hyperlipidemia Assessment & Plan: Continue crestor.  Low cholesterol diet and exercise.  Follow lipid panel and liver function tests.    Osteopenia, unspecified location Assessment & Plan: Scheduled bone density.    Other orders -     Furosemide; Take 1  tablet (20 mg total) by mouth daily as needed.  Dispense: 90 tablet; Refill: 0 -     Felodipine ER; Take 1 tablet (2.5 mg total) by mouth daily.  Dispense: 90 tablet; Refill: 3     Dale Moab, MD

## 2022-08-14 ENCOUNTER — Other Ambulatory Visit: Payer: Self-pay | Admitting: Internal Medicine

## 2022-08-14 ENCOUNTER — Ambulatory Visit: Payer: PPO | Attending: Internal Medicine | Admitting: Internal Medicine

## 2022-08-14 ENCOUNTER — Encounter: Payer: Self-pay | Admitting: Internal Medicine

## 2022-08-14 VITALS — BP 112/60 | HR 65 | Ht 60.0 in | Wt 127.5 lb

## 2022-08-14 DIAGNOSIS — Z79899 Other long term (current) drug therapy: Secondary | ICD-10-CM

## 2022-08-14 DIAGNOSIS — I10 Essential (primary) hypertension: Secondary | ICD-10-CM | POA: Diagnosis not present

## 2022-08-14 DIAGNOSIS — E782 Mixed hyperlipidemia: Secondary | ICD-10-CM | POA: Diagnosis not present

## 2022-08-14 DIAGNOSIS — I4819 Other persistent atrial fibrillation: Secondary | ICD-10-CM

## 2022-08-14 DIAGNOSIS — Z5181 Encounter for therapeutic drug level monitoring: Secondary | ICD-10-CM | POA: Diagnosis not present

## 2022-08-14 MED ORDER — APIXABAN 2.5 MG PO TABS
ORAL_TABLET | ORAL | 0 refills | Status: DC
Start: 2022-08-14 — End: 2023-08-02

## 2022-08-14 MED ORDER — APIXABAN 2.5 MG PO TABS
ORAL_TABLET | ORAL | 11 refills | Status: DC
Start: 2022-08-14 — End: 2022-08-14

## 2022-08-14 MED ORDER — AMIODARONE HCL 100 MG PO TABS: 100.0000 mg | ORAL_TABLET | Freq: Every day | ORAL | 3 refills | Status: DC

## 2022-08-14 NOTE — Patient Instructions (Signed)
Medication Instructions:  Your Physician recommend you continue on your current medication as directed.    *If you need a refill on your cardiac medications before your next appointment, please call your pharmacy*   Lab Work: None ordered today   Testing/Procedures: Your provider has ordered a chest X-Ray for you. You can have this done at the [facility] medical mall. You do not need an appointment. Please go to the entrance of the Medical Mall and check in at the front desk.   Follow-Up: At [facility], you and your health needs are our priority.  As part of our continuing mission to provide you with exceptional heart care, we have created designated Provider Care Teams.  These Care Teams include your primary Cardiologist (physician) and Advanced Practice Providers (APPs -  Physician Assistants and Nurse Practitioners) who all work together to provide you with the care you need, when you need it.  We recommend signing up for the patient portal called "MyChart".  Sign up information is provided on this After Visit Summary.  MyChart is used to connect with patients for Virtual Visits (Telemedicine).  Patients are able to view lab/test results, encounter notes, upcoming appointments, etc.  Non-urgent messages can be sent to your provider as well.   To learn more about what you can do with MyChart, go to [email or web address].    Your next appointment:   6 month(s)  Provider:   You may see Yvonne Kendall, MD or one of the following Advanced Practice Providers on your designated Care Team:   Nicolasa Ducking, NP Eula Listen, PA-C Cadence Fransico Michael, PA-C Charlsie Quest, NP

## 2022-08-14 NOTE — Progress Notes (Signed)
Follow-up Outpatient Visit Date: 08/14/2022  Primary Care Provider: Dale [place], MD [address] [place] [address]  Chief Complaint: Follow-up persistent atrial fibrillation  HPI:  Erin [name] is a 89 y.o. female with history of persistent atrial fibrillation, hypertension, hyperlipidemia, and breast cancer, who presents for follow-up of atrial fibrillation.  I last saw her in November, at which time she was feeling fairly well.  She noted that she was taking benazepril only once a day instead of twice daily at the direction of Dr. Lorin Picket.  She also reported slight increase in her resting heart rate after we decreased amiodarone at our prior visit.  Chronic exertional dyspnea was stable.  Today, Erin [name] reports that she has been feeling well, denying chest pain, shortness of breath, palpitations, lightheadedness, and edema.  She is walking 1.5 miles per day without difficulty.  She is using her as needed furosemide 3 to 4 days a week on average.  She notes that she has had some difficulty obtaining apixaban, as her pharmacy has not been able to give her 90-day supplies due to shortage of the medication.  She took her last dose that she has on hand today.  --------------------------------------------------------------------------------------------------  Cardiovascular History & Procedures: Cardiovascular Problems: Persistent atrial fibrillation Atypical chest pain   Risk Factors: Hypertension, hyperlipidemia, and age greater than 83   Cath/PCI: None   CV Surgery: None   EP Procedures and Devices: DCCV (09/03/2020) DCCV (07/30/2020): Unsuccessful - amiodarone load started Event monitor (10/07/16): Predominantly sinus rhythm with frequent PACs and occasional PVCs. Single episode of NSVT. Multiple atrial runs lasting up to 19.4 seconds. No sustained arrhythmias. Event monitor (07/07/16): Predominantly sinus rhythm with 20 episodes of narrow complex  tachycardia and irregularity consistent with atrial fibrillation (longest episode noted approximately 12 seconds). Frequent PACs and rare PVCs were noted.   Non-Invasive Evaluation(s): Pharmacologic MPI (10/31/2020): Low risk study without ischemia or scar.  LVEF greater than 65%.  Coronary artery calcification and aortic atherosclerosis noted. TTE (07/04/2020): Normal LV size and wall thickness.  LVEF 60-65% with normal wall motion.  Normal RV size and function.  Moderate left atrial enlargement.  No significant valvular abnormality. TTE (08/06/16): Normal LV size with LVEF of 55-65%. Normal wall motion with grade 1 diastolic dysfunction. Mild left atrial enlargement. Normal RV size and function. Exercise MPI (07/13/16): Low risk study without ischemia or scar. LVEF greater than 65%. Decreased exercise capacity, exercising 4 minutes, zero seconds, achieving 117 bpm (90% MPHR; 4.6 METs).  Recent CV Pertinent Labs: Lab Results  Component Value Date   CHOL 175 07/31/2022   HDL 93.90 07/31/2022   LDLCALC 65 07/31/2022   TRIG 81.0 07/31/2022   CHOLHDL 2 07/31/2022   K 4.0 07/31/2022   K 3.5 12/21/2011   MG 2.0 06/07/2020   BUN 18 07/31/2022   CREATININE 0.98 07/31/2022   CREATININE 0.92 (H) 06/07/2020    Past medical and surgical history were reviewed and updated in EPIC.  Current Meds  Medication Sig   acetaminophen (TYLENOL) 325 MG tablet Take 650 mg by mouth every 6 (six) hours as needed for moderate pain or headache.   amiodarone (PACERONE) 100 MG tablet Take 1 tablet (100 mg total) by mouth daily.   apixaban (ELIQUIS) 2.5 MG TABS tablet Take 1 tablet (2.5 mg total) by mouth 2 (two) times daily.   benazepril (LOTENSIN) 40 MG tablet Take 1 tablet (40 mg total) by mouth 2 (two) times daily.   Calcium Carbonate-Vitamin D 600-400  MG-UNIT tablet Take 1 tablet by mouth in the morning.   felodipine (PLENDIL) 2.5 MG 24 hr tablet Take 1 tablet (2.5 mg total) by mouth daily.   furosemide (LASIX)  20 MG tablet Take 1 tablet (20 mg total) by mouth daily as needed.   Multiple Vitamin (MULTIVITAMIN WITH MINERALS) TABS tablet Take 1 tablet by mouth in the morning.   rosuvastatin (CRESTOR) 5 MG tablet Take 1 tablet (5 mg total) by mouth daily.    Allergies: Erythromycin and Minocin [minocycline hcl]  Social History   Tobacco Use   Smoking status: Former   Smokeless tobacco: Never  Vaping Use   Vaping Use: Never used  Substance Use Topics   Alcohol use: Yes    Alcohol/week: 0.0 standard drinks of alcohol    Comment: occassionally   Drug use: No    Family History  Problem Relation Age of Onset   Hypertension Mother    Hypertension Sister    Multiple sclerosis Sister        died 25   Heart disease Brother        S/P CABG   Breast cancer Maternal Aunt     Review of Systems: A 12-system review of systems was performed and was negative except as noted in the HPI.  --------------------------------------------------------------------------------------------------  Physical Exam: BP 112/60 (BP Location: Left Arm, Patient Position: Sitting, Cuff Size: Normal)   Pulse 65   Ht 5' (1.524 m)   Wt 127 lb 8 oz (57.8 kg)   SpO2 98%   BMI 24.90 kg/m   General:  NAD. Neck: No JVD or HJR. Lungs: Clear to auscultation bilaterally without wheezes or crackles. Heart: Regular rate and rhythm without murmurs, rubs, or gallops. Abdomen: Soft, nontender, nondistended. Extremities: No lower extremity edema.  EKG: Normal sinus rhythm with nonspecific T wave abnormality.  No significant change since 05/27/2022.  Lab Results  Component Value Date   WBC 4.3 03/31/2022   HGB 14.7 03/31/2022   HCT 43.5 03/31/2022   MCV 99.6 03/31/2022   PLT 187.0 03/31/2022    Lab Results  Component Value Date   NA 137 07/31/2022   K 4.0 07/31/2022   CL 101 07/31/2022   CO2 29 07/31/2022   BUN 18 07/31/2022   CREATININE 0.98 07/31/2022   GLUCOSE 96 07/31/2022   ALT 14 07/31/2022    Lab  Results  Component Value Date   CHOL 175 07/31/2022   HDL 93.90 07/31/2022   LDLCALC 65 07/31/2022   TRIG 81.0 07/31/2022   CHOLHDL 2 07/31/2022    --------------------------------------------------------------------------------------------------  ASSESSMENT AND PLAN: Persistent atrial fibrillation: Erin [name] continues to feel well and is maintaining sinus rhythm by EKG today.  We will plan to continue low-dose amiodarone (100 mg daily).  Recent LFTs were normal.  Her last TSH in 11/2021 was also normal; this should be repeated with next blood draw.  We will obtain a PA and lateral chest radiograph at her convenience to ensure that she is not developing any findings of lung toxicity from chronic amiodarone therapy.  Continue apixaban 2.5 mg twice daily based on age and weight.  New prescription as well as samples provided today.  Hypertension: Blood pressure well-controlled today.  No medication changes at this time.  Hyperlipidemia: Lipids very well-controlled on last check about 2 weeks ago.  Continue rosuvastatin at the discretion of Dr. Lorin Picket.  Follow-up: Return to clinic in 6 months.  Yvonne Kendall, MD 08/14/2022 9:20 AM

## 2022-08-16 ENCOUNTER — Encounter: Payer: Self-pay | Admitting: Internal Medicine

## 2022-08-16 NOTE — Assessment & Plan Note (Signed)
Scheduled bone density

## 2022-08-16 NOTE — Assessment & Plan Note (Signed)
S/p cardioversion.  Appears to be in SR today.  Continues on eliquis and amiodarone.  Recently had dose of amiodarone decreased to 100mg .  Resting heart rate has improved.  Seeing Dr End. Follow.

## 2022-08-16 NOTE — Assessment & Plan Note (Signed)
Low carb diet and exercise.  Follow met b and a1c.   

## 2022-08-16 NOTE — Assessment & Plan Note (Signed)
Followed by dermatology

## 2022-08-16 NOTE — Assessment & Plan Note (Addendum)
Continue benazepril.  Back on bid dosing now.  Reviewed attached blood pressures - most readings 120-130/60-70s.  Follow pressures.  Follow metabolic panel.

## 2022-08-16 NOTE — Assessment & Plan Note (Signed)
Continue crestor.  Low cholesterol diet and exercise.  Follow lipid panel and liver function tests.  

## 2022-08-20 DIAGNOSIS — H6123 Impacted cerumen, bilateral: Secondary | ICD-10-CM | POA: Diagnosis not present

## 2022-08-20 DIAGNOSIS — H903 Sensorineural hearing loss, bilateral: Secondary | ICD-10-CM | POA: Diagnosis not present

## 2022-08-21 ENCOUNTER — Ambulatory Visit
Admission: RE | Admit: 2022-08-21 | Discharge: 2022-08-21 | Disposition: A | Discharge status: Home / Self Care | Payer: PPO | Attending: Internal Medicine | Admitting: Internal Medicine

## 2022-08-21 ENCOUNTER — Ambulatory Visit
Admission: RE | Admit: 2022-08-21 | Discharge: 2022-08-21 | Disposition: A | Discharge status: Home / Self Care | Payer: PPO | Source: Ambulatory Visit | Attending: Internal Medicine | Admitting: Internal Medicine

## 2022-08-21 DIAGNOSIS — Z79899 Other long term (current) drug therapy: Secondary | ICD-10-CM | POA: Diagnosis not present

## 2022-08-21 DIAGNOSIS — Z0389 Encounter for observation for other suspected diseases and conditions ruled out: Secondary | ICD-10-CM | POA: Diagnosis not present

## 2022-08-21 DIAGNOSIS — Z5181 Encounter for therapeutic drug level monitoring: Secondary | ICD-10-CM | POA: Insufficient documentation

## 2022-09-24 ENCOUNTER — Ambulatory Visit
Admission: RE | Admit: 2022-09-24 | Discharge: 2022-09-24 | Disposition: A | Discharge status: Home / Self Care | Payer: PPO | Source: Ambulatory Visit | Attending: Internal Medicine | Admitting: Internal Medicine

## 2022-09-24 DIAGNOSIS — M8589 Other specified disorders of bone density and structure, multiple sites: Secondary | ICD-10-CM | POA: Diagnosis not present

## 2022-09-24 DIAGNOSIS — E2839 Other primary ovarian failure: Secondary | ICD-10-CM | POA: Diagnosis not present

## 2022-09-24 DIAGNOSIS — Z780 Asymptomatic menopausal state: Secondary | ICD-10-CM | POA: Diagnosis not present

## 2022-10-20 ENCOUNTER — Other Ambulatory Visit: Payer: Self-pay | Admitting: Internal Medicine

## 2022-10-29 ENCOUNTER — Encounter (INDEPENDENT_AMBULATORY_CARE_PROVIDER_SITE_OTHER): Payer: Self-pay

## 2022-12-03 ENCOUNTER — Other Ambulatory Visit (INDEPENDENT_AMBULATORY_CARE_PROVIDER_SITE_OTHER): Payer: PPO

## 2022-12-03 DIAGNOSIS — I10 Essential (primary) hypertension: Secondary | ICD-10-CM | POA: Diagnosis not present

## 2022-12-03 DIAGNOSIS — E785 Hyperlipidemia, unspecified: Secondary | ICD-10-CM | POA: Diagnosis not present

## 2022-12-03 DIAGNOSIS — R739 Hyperglycemia, unspecified: Secondary | ICD-10-CM

## 2022-12-03 LAB — HEPATIC FUNCTION PANEL
ALT: 13 U/L (ref 0–35)
AST: 17 U/L (ref 0–37)
Albumin: 4.3 g/dL (ref 3.5–5.2)
Alkaline Phosphatase: 64 U/L (ref 39–117)
Bilirubin, Direct: 0.2 mg/dL (ref 0.0–0.3)
Total Bilirubin: 0.8 mg/dL (ref 0.2–1.2)
Total Protein: 6.8 g/dL (ref 6.0–8.3)

## 2022-12-03 LAB — BASIC METABOLIC PANEL
BUN: 17 mg/dL (ref 6–23)
CO2: 29 mEq/L (ref 19–32)
Calcium: 10.1 mg/dL (ref 8.4–10.5)
Chloride: 102 mEq/L (ref 96–112)
Creatinine, Ser: 0.95 mg/dL (ref 0.40–1.20)
GFR: 52.68 mL/min — ABNORMAL LOW (ref >60.00)
Glucose, Bld: 96 mg/dL (ref 70–99)
Potassium: 4.2 mEq/L (ref 3.5–5.1)
Sodium: 139 mEq/L (ref 135–145)

## 2022-12-03 LAB — LIPID PANEL
Cholesterol: 172 mg/dL (ref 0–200)
HDL: 96.1 mg/dL (ref >39.00)
LDL Cholesterol: 59 mg/dL (ref 0–99)
NonHDL: 75.46
Total CHOL/HDL Ratio: 2
Triglycerides: 80 mg/dL (ref 0.0–149.0)
VLDL: 16 mg/dL (ref 0.0–40.0)

## 2022-12-03 LAB — TSH: TSH: 0.99 u[IU]/mL (ref 0.35–5.50)

## 2022-12-03 LAB — HEMOGLOBIN A1C: Hgb A1c MFr Bld: 5.5 % (ref 4.6–6.5)

## 2022-12-08 ENCOUNTER — Ambulatory Visit (INDEPENDENT_AMBULATORY_CARE_PROVIDER_SITE_OTHER): Payer: PPO | Admitting: Internal Medicine

## 2022-12-08 ENCOUNTER — Encounter: Payer: Self-pay | Admitting: Internal Medicine

## 2022-12-08 DIAGNOSIS — Z860100 Personal history of colon polyps, unspecified: Secondary | ICD-10-CM

## 2022-12-08 DIAGNOSIS — R739 Hyperglycemia, unspecified: Secondary | ICD-10-CM | POA: Diagnosis not present

## 2022-12-08 DIAGNOSIS — I10 Essential (primary) hypertension: Secondary | ICD-10-CM | POA: Diagnosis not present

## 2022-12-08 DIAGNOSIS — I4819 Other persistent atrial fibrillation: Secondary | ICD-10-CM

## 2022-12-08 DIAGNOSIS — D0592 Unspecified type of carcinoma in situ of left breast: Secondary | ICD-10-CM

## 2022-12-08 MED ORDER — ROSUVASTATIN CALCIUM 5 MG PO TABS: 5.0000 mg | ORAL_TABLET | Freq: Every day | ORAL | 3 refills | Status: DC

## 2022-12-08 MED ORDER — BENAZEPRIL HCL 40 MG PO TABS: 40.0000 mg | ORAL_TABLET | Freq: Two times a day (BID) | ORAL | 1 refills | Status: DC

## 2022-12-08 MED ORDER — FUROSEMIDE 20 MG PO TABS
20.0000 mg | ORAL_TABLET | Freq: Every day | ORAL | 0 refills | Status: AC | PRN
Start: 2022-12-08 — End: ?

## 2022-12-08 MED ORDER — FELODIPINE ER 2.5 MG PO TB24: 2.5000 mg | ORAL_TABLET | Freq: Every day | ORAL | 3 refills | Status: DC

## 2022-12-08 NOTE — Progress Notes (Signed)
alert.  Psychiatric:        Mood and Affect: Mood normal.        Behavior: Behavior normal.      Outpatient Encounter Medications as of 12/08/2022  Medication Sig   acetaminophen (TYLENOL) 325 MG tablet Take 650 mg by mouth every 6 (six) hours as needed for moderate pain or headache.   amiodarone (PACERONE) 100 MG tablet Take 1 tablet (100 mg total) by mouth daily.   apixaban (ELIQUIS) 2.5 MG TABS tablet Take 1 tablet (2.5 mg total) by mouth 2 (two) times daily.   benazepril (LOTENSIN) 40 MG tablet Take 1 tablet (40 mg total) by mouth 2 (two) times daily.   Calcium Carbonate-Vitamin D 600-400 MG-UNIT tablet Take 1 tablet by mouth in the morning.   felodipine (PLENDIL) 2.5 MG 24 hr tablet Take 1 tablet (2.5 mg total) by mouth daily.   furosemide (LASIX) 20 MG tablet Take 1 tablet (20 mg total) by mouth daily as needed.   Multiple Vitamin (MULTIVITAMIN WITH MINERALS) TABS tablet Take 1 tablet by mouth in the morning.   rosuvastatin (CRESTOR) 5 MG tablet Take 1 tablet (5 mg total) by mouth daily.   [DISCONTINUED] benazepril (LOTENSIN) 40 MG tablet Take 1 tablet (40 mg total) by mouth 2 (two) times daily.   [DISCONTINUED] felodipine (PLENDIL) 2.5 MG 24 hr tablet Take 1 tablet (2.5 mg total) by mouth daily.   [DISCONTINUED] furosemide (LASIX) 20 MG tablet Take 1 tablet (20 mg total) by mouth daily as needed.   [DISCONTINUED] rosuvastatin (CRESTOR) 5 MG tablet Take 1 tablet (5 mg total) by mouth daily.   No facility-administered encounter medications on file as of 12/08/2022.     Lab Results  Component Value Date   WBC 4.3 03/31/2022    HGB 14.7 03/31/2022   HCT 43.5 03/31/2022   PLT 187.0 03/31/2022   GLUCOSE 96 12/03/2022   CHOL 172 12/03/2022   TRIG 80.0 12/03/2022   HDL 96.10 12/03/2022   LDLCALC 59 12/03/2022   ALT 13 12/03/2022   AST 17 12/03/2022   NA 139 12/03/2022   K 4.2 12/03/2022   CL 102 12/03/2022   CREATININE 0.95 12/03/2022   BUN 17 12/03/2022   CO2 29 12/03/2022   TSH 0.99 12/03/2022   HGBA1C 5.5 12/03/2022    DG Bone Density  Result Date: 09/24/2022 EXAM: DUAL X-RAY ABSORPTIOMETRY (DXA) FOR BONE MINERAL DENSITY IMPRESSION: Your patient Erin [name] completed a BMD test on 09/24/2022 using the [employer] DXA System (software version: 14.10) manufactured by [employer]. The following summarizes the results of our evaluation. Technologist: MTB PATIENT BIOGRAPHICAL: Name: Erin [name], Erin [name] Patient ID: [id number] Birth Date: 04/15/1930 Height: 58.0 in. Gender: Female Exam Date: 09/24/2022 Weight: 128.0 lbs. Indications: Caucasian, History of Breast Cancer, Postmenopausal Fractures: Treatments: Calcium, Vitamin D DENSITOMETRY RESULTS: Site         Region     Measured Date Measured Age WHO Classification Young Adult T-score BMD         %Change vs. Previous Significant Change (*) DualFemur Neck Right 09/24/2022 90.5 Osteopenia -2.0 0.758 g/cm2 - - DualFemur Total Mean 09/24/2022 90.5 Normal -1.0 0.884 g/cm2 - - Left Forearm Radius 33% 09/24/2022 90.5 Osteopenia -2.2 0.685 g/cm2 - - ASSESSMENT: The BMD measured at Forearm Radius 33% is 0.685 g/cm2 with a T-score of -2.2. This patient is considered osteopenic according to World Health Organization [facility]) criteria. The scan quality is good. Lumbar spine was not utilized due to (advanced degenerative  Subjective:    Patient ID: Erin [name], female    DOB: [birth year], 89 y.o.   MRN: [id number]  Patient here for  Chief Complaint  Patient presents with   Medication Management    HPI Here to follow up regarding afib, hypertension and hypercholesterolemia. History of afib s/p cardioversion. Followed by Dr End. Continues on amiodarone and eliquis.  Last evaluated 08/14/22 - stable.  Recommended continuing amiodarone and eliquis.  She does report her watch alarming at night - intermittently - pulse rate in the 30s.  Has discussed with cardiology and they have discussed with her regarding possible pacemaker.  Discussed today.  She denies any dizziness, light headedness, near syncope or syncope.  Has desired not to pursue pacemaker.  Feels things are stable.  States she will discuss more with cardiology.  No chest pain.  Breathing stable.  No increased cough or congestion.  No abdominal pain or bowel change.    Past Medical History:  Diagnosis Date   A-fib [facility])    Breast cancer (HCC)    Cancer (HCC)    melanoma - arm left   Chest pain 07/13/2016   History of colon polyps    Hypercholesterolemia    Hypertension    Personal history of radiation therapy    Past Surgical History:  Procedure Laterality Date   APPENDECTOMY  1946   BREAST BIOPSY Left 06/04/2020   [place] bx, vision marker, positive   BREAST LUMPECTOMY Left 06/24/2020   BREAST LUMPECTOMY WITH SENTINEL LYMPH NODE BIOPSY Left 06/24/2020   Procedure: BREAST LUMPECTOMY WITH SENTINEL LYMPH NODE BIOPSY;  Surgeon: Earline Mayotte, MD;  Location: [facility] ORS;  Service: General;  Laterality: Left;   CARDIOVERSION N/A 07/30/2020   Procedure: CARDIOVERSION;  Surgeon: Yvonne Kendall, MD;  Location: [facility] ORS;  Service: Cardiovascular;  Laterality: N/A;   CARDIOVERSION N/A 09/03/2020   Procedure: CARDIOVERSION;  Surgeon: Yvonne Kendall, MD;  Location: [facility] ORS;  Service: Cardiovascular;  Laterality: N/A;   CATARACT EXTRACTION Bilateral     DILATION AND CURETTAGE OF UTERUS     LAMINECTOMY  2000   release of foraminal stenosis  2003   Family History  Problem Relation Age of Onset   Hypertension Mother    Hypertension Sister    Multiple sclerosis Sister        died 52   Heart disease Brother        S/P CABG   Breast cancer Maternal Aunt    Social History   Socioeconomic History   Marital status: Widowed    Spouse name: Not on file   Number of children: 4   Years of education: Not on file   Highest education level: Not on file  Occupational History   Occupation: retired  Tobacco Use   Smoking status: Former   Smokeless tobacco: Never  Vaping Use   Vaping status: Never Used  Substance and Sexual Activity   Alcohol use: Yes    Alcohol/week: 0.0 standard drinks of alcohol    Comment: occassionally   Drug use: No   Sexual activity: Never  Other Topics Concern   Not on file  Social History Narrative   Not on file   Social Determinants of Health   Financial Resource Strain: Low Risk  (05/07/2022)   Overall Financial Resource Strain (CARDIA)    Difficulty of Paying Living Expenses: Not hard at all  Food Insecurity: No Food Insecurity (05/07/2022)   Hunger Vital Sign    Worried About Running Out of  alert.  Psychiatric:        Mood and Affect: Mood normal.        Behavior: Behavior normal.      Outpatient Encounter Medications as of 12/08/2022  Medication Sig   acetaminophen (TYLENOL) 325 MG tablet Take 650 mg by mouth every 6 (six) hours as needed for moderate pain or headache.   amiodarone (PACERONE) 100 MG tablet Take 1 tablet (100 mg total) by mouth daily.   apixaban (ELIQUIS) 2.5 MG TABS tablet Take 1 tablet (2.5 mg total) by mouth 2 (two) times daily.   benazepril (LOTENSIN) 40 MG tablet Take 1 tablet (40 mg total) by mouth 2 (two) times daily.   Calcium Carbonate-Vitamin D 600-400 MG-UNIT tablet Take 1 tablet by mouth in the morning.   felodipine (PLENDIL) 2.5 MG 24 hr tablet Take 1 tablet (2.5 mg total) by mouth daily.   furosemide (LASIX) 20 MG tablet Take 1 tablet (20 mg total) by mouth daily as needed.   Multiple Vitamin (MULTIVITAMIN WITH MINERALS) TABS tablet Take 1 tablet by mouth in the morning.   rosuvastatin (CRESTOR) 5 MG tablet Take 1 tablet (5 mg total) by mouth daily.   [DISCONTINUED] benazepril (LOTENSIN) 40 MG tablet Take 1 tablet (40 mg total) by mouth 2 (two) times daily.   [DISCONTINUED] felodipine (PLENDIL) 2.5 MG 24 hr tablet Take 1 tablet (2.5 mg total) by mouth daily.   [DISCONTINUED] furosemide (LASIX) 20 MG tablet Take 1 tablet (20 mg total) by mouth daily as needed.   [DISCONTINUED] rosuvastatin (CRESTOR) 5 MG tablet Take 1 tablet (5 mg total) by mouth daily.   No facility-administered encounter medications on file as of 12/08/2022.     Lab Results  Component Value Date   WBC 4.3 03/31/2022    HGB 14.7 03/31/2022   HCT 43.5 03/31/2022   PLT 187.0 03/31/2022   GLUCOSE 96 12/03/2022   CHOL 172 12/03/2022   TRIG 80.0 12/03/2022   HDL 96.10 12/03/2022   LDLCALC 59 12/03/2022   ALT 13 12/03/2022   AST 17 12/03/2022   NA 139 12/03/2022   K 4.2 12/03/2022   CL 102 12/03/2022   CREATININE 0.95 12/03/2022   BUN 17 12/03/2022   CO2 29 12/03/2022   TSH 0.99 12/03/2022   HGBA1C 5.5 12/03/2022    DG Bone Density  Result Date: 09/24/2022 EXAM: DUAL X-RAY ABSORPTIOMETRY (DXA) FOR BONE MINERAL DENSITY IMPRESSION: Your patient Erin [name] completed a BMD test on 09/24/2022 using the [employer] DXA System (software version: 14.10) manufactured by [employer]. The following summarizes the results of our evaluation. Technologist: MTB PATIENT BIOGRAPHICAL: Name: Erin [name], Erin [name] Patient ID: [id number] Birth Date: 04/15/1930 Height: 58.0 in. Gender: Female Exam Date: 09/24/2022 Weight: 128.0 lbs. Indications: Caucasian, History of Breast Cancer, Postmenopausal Fractures: Treatments: Calcium, Vitamin D DENSITOMETRY RESULTS: Site         Region     Measured Date Measured Age WHO Classification Young Adult T-score BMD         %Change vs. Previous Significant Change (*) DualFemur Neck Right 09/24/2022 90.5 Osteopenia -2.0 0.758 g/cm2 - - DualFemur Total Mean 09/24/2022 90.5 Normal -1.0 0.884 g/cm2 - - Left Forearm Radius 33% 09/24/2022 90.5 Osteopenia -2.2 0.685 g/cm2 - - ASSESSMENT: The BMD measured at Forearm Radius 33% is 0.685 g/cm2 with a T-score of -2.2. This patient is considered osteopenic according to World Health Organization [facility]) criteria. The scan quality is good. Lumbar spine was not utilized due to (advanced degenerative  alert.  Psychiatric:        Mood and Affect: Mood normal.        Behavior: Behavior normal.      Outpatient Encounter Medications as of 12/08/2022  Medication Sig   acetaminophen (TYLENOL) 325 MG tablet Take 650 mg by mouth every 6 (six) hours as needed for moderate pain or headache.   amiodarone (PACERONE) 100 MG tablet Take 1 tablet (100 mg total) by mouth daily.   apixaban (ELIQUIS) 2.5 MG TABS tablet Take 1 tablet (2.5 mg total) by mouth 2 (two) times daily.   benazepril (LOTENSIN) 40 MG tablet Take 1 tablet (40 mg total) by mouth 2 (two) times daily.   Calcium Carbonate-Vitamin D 600-400 MG-UNIT tablet Take 1 tablet by mouth in the morning.   felodipine (PLENDIL) 2.5 MG 24 hr tablet Take 1 tablet (2.5 mg total) by mouth daily.   furosemide (LASIX) 20 MG tablet Take 1 tablet (20 mg total) by mouth daily as needed.   Multiple Vitamin (MULTIVITAMIN WITH MINERALS) TABS tablet Take 1 tablet by mouth in the morning.   rosuvastatin (CRESTOR) 5 MG tablet Take 1 tablet (5 mg total) by mouth daily.   [DISCONTINUED] benazepril (LOTENSIN) 40 MG tablet Take 1 tablet (40 mg total) by mouth 2 (two) times daily.   [DISCONTINUED] felodipine (PLENDIL) 2.5 MG 24 hr tablet Take 1 tablet (2.5 mg total) by mouth daily.   [DISCONTINUED] furosemide (LASIX) 20 MG tablet Take 1 tablet (20 mg total) by mouth daily as needed.   [DISCONTINUED] rosuvastatin (CRESTOR) 5 MG tablet Take 1 tablet (5 mg total) by mouth daily.   No facility-administered encounter medications on file as of 12/08/2022.     Lab Results  Component Value Date   WBC 4.3 03/31/2022    HGB 14.7 03/31/2022   HCT 43.5 03/31/2022   PLT 187.0 03/31/2022   GLUCOSE 96 12/03/2022   CHOL 172 12/03/2022   TRIG 80.0 12/03/2022   HDL 96.10 12/03/2022   LDLCALC 59 12/03/2022   ALT 13 12/03/2022   AST 17 12/03/2022   NA 139 12/03/2022   K 4.2 12/03/2022   CL 102 12/03/2022   CREATININE 0.95 12/03/2022   BUN 17 12/03/2022   CO2 29 12/03/2022   TSH 0.99 12/03/2022   HGBA1C 5.5 12/03/2022    DG Bone Density  Result Date: 09/24/2022 EXAM: DUAL X-RAY ABSORPTIOMETRY (DXA) FOR BONE MINERAL DENSITY IMPRESSION: Your patient Erin [name] completed a BMD test on 09/24/2022 using the [employer] DXA System (software version: 14.10) manufactured by [employer]. The following summarizes the results of our evaluation. Technologist: MTB PATIENT BIOGRAPHICAL: Name: Erin [name], Erin [name] Patient ID: [id number] Birth Date: 04/15/1930 Height: 58.0 in. Gender: Female Exam Date: 09/24/2022 Weight: 128.0 lbs. Indications: Caucasian, History of Breast Cancer, Postmenopausal Fractures: Treatments: Calcium, Vitamin D DENSITOMETRY RESULTS: Site         Region     Measured Date Measured Age WHO Classification Young Adult T-score BMD         %Change vs. Previous Significant Change (*) DualFemur Neck Right 09/24/2022 90.5 Osteopenia -2.0 0.758 g/cm2 - - DualFemur Total Mean 09/24/2022 90.5 Normal -1.0 0.884 g/cm2 - - Left Forearm Radius 33% 09/24/2022 90.5 Osteopenia -2.2 0.685 g/cm2 - - ASSESSMENT: The BMD measured at Forearm Radius 33% is 0.685 g/cm2 with a T-score of -2.2. This patient is considered osteopenic according to World Health Organization [facility]) criteria. The scan quality is good. Lumbar spine was not utilized due to (advanced degenerative

## 2022-12-11 ENCOUNTER — Encounter: Payer: Self-pay | Admitting: Internal Medicine

## 2022-12-11 NOTE — Assessment & Plan Note (Signed)
S/p breast lumpectomy 06/24/20.  Has seen Dr Bary Castilla.  Up to date with mammogram.

## 2022-12-11 NOTE — Assessment & Plan Note (Signed)
Continue benazepril.  On bid dosing. Reviewed attached blood pressures - most readings 115-120s/60-70s.  Follow pressures.  Follow metabolic panel.

## 2022-12-11 NOTE — Assessment & Plan Note (Signed)
S/p cardioversion.  Appears to be in SR today.  Continues on eliquis and amiodarone.  Had dose of amiodarone decreased to 100mg .  Seeing Dr End. Discussed intermittent low heart rate. She denies dizziness, light headedness, syncope or near syncope.  Discussed reevaluation - pacemaker, etc.  She feel things are stable.  Plans to discuss more with cardiology.  Desires no further intervention at this time.  Follow.

## 2022-12-11 NOTE — Assessment & Plan Note (Signed)
Low carb diet and exercise.  Follow met b and a1c.  

## 2022-12-11 NOTE — Assessment & Plan Note (Signed)
Colonoscopy 2012 - six polyps.  GI felt no further colonoscopy warranted.

## 2022-12-21 DIAGNOSIS — Z961 Presence of intraocular lens: Secondary | ICD-10-CM | POA: Diagnosis not present

## 2023-02-03 DIAGNOSIS — L570 Actinic keratosis: Secondary | ICD-10-CM | POA: Diagnosis not present

## 2023-02-03 DIAGNOSIS — D2262 Melanocytic nevi of left upper limb, including shoulder: Secondary | ICD-10-CM | POA: Diagnosis not present

## 2023-02-03 DIAGNOSIS — D2271 Melanocytic nevi of right lower limb, including hip: Secondary | ICD-10-CM | POA: Diagnosis not present

## 2023-02-03 DIAGNOSIS — D2261 Melanocytic nevi of right upper limb, including shoulder: Secondary | ICD-10-CM | POA: Diagnosis not present

## 2023-02-03 DIAGNOSIS — L988 Other specified disorders of the skin and subcutaneous tissue: Secondary | ICD-10-CM | POA: Diagnosis not present

## 2023-02-03 DIAGNOSIS — Z85820 Personal history of malignant melanoma of skin: Secondary | ICD-10-CM | POA: Diagnosis not present

## 2023-02-03 DIAGNOSIS — D485 Neoplasm of uncertain behavior of skin: Secondary | ICD-10-CM | POA: Diagnosis not present

## 2023-02-03 DIAGNOSIS — Z85828 Personal history of other malignant neoplasm of skin: Secondary | ICD-10-CM | POA: Diagnosis not present

## 2023-02-03 DIAGNOSIS — D225 Melanocytic nevi of trunk: Secondary | ICD-10-CM | POA: Diagnosis not present

## 2023-02-03 DIAGNOSIS — X32XXXA Exposure to sunlight, initial encounter: Secondary | ICD-10-CM | POA: Diagnosis not present

## 2023-02-25 ENCOUNTER — Encounter: Payer: Self-pay | Admitting: Internal Medicine

## 2023-02-25 ENCOUNTER — Ambulatory Visit: Payer: PPO | Attending: Internal Medicine | Admitting: Internal Medicine

## 2023-02-25 VITALS — BP 122/60 | HR 81 | Ht 59.0 in | Wt 127.2 lb

## 2023-02-25 DIAGNOSIS — R0789 Other chest pain: Secondary | ICD-10-CM

## 2023-02-25 DIAGNOSIS — E782 Mixed hyperlipidemia: Secondary | ICD-10-CM

## 2023-02-25 DIAGNOSIS — D6869 Other thrombophilia: Secondary | ICD-10-CM

## 2023-02-25 DIAGNOSIS — I10 Essential (primary) hypertension: Secondary | ICD-10-CM

## 2023-02-25 DIAGNOSIS — I4819 Other persistent atrial fibrillation: Secondary | ICD-10-CM | POA: Insufficient documentation

## 2023-02-25 NOTE — Progress Notes (Signed)
Cardiology Office Note:  .   Date:  02/25/2023  ID:  Erin [name], DOB 27-Dec-1930, MRN [id number] PCP: Dale Livingston, MD  [facility] Providers Cardiologist:  Yvonne Kendall, MD     History of Present Illness: .   Erin [name] is a 89 y.o. female with history of persistent atrial fibrillation, hypertension, hyperlipidemia, and breast cancer, who presents for follow-up of atrial fibrillation.  I last saw her in May, at which time she was feeling well.  She continued to walk 1.5 miles a day without difficulty.  We did not make any medication changes or pursue additional testing.  Today, Erin [name] reports that she has been feeling fairly well.  She reports sporadic pain along the left lateral chest wall below the axilla that most often occurs at night and seems to be positional.  She does not have any exertional chest pain.  She has noticed that her heart rate seems a little bit higher than what she is accustomed to at times though she denies frank palpitations as well as shortness of breath, lightheadedness, and edema.  She is using her as needed furosemide 3-4 times a week based on her weight changes.  She remains compliant with apixaban and has not had any bleeding or falls.  ROS: See HPI  Studies Reviewed: Marland Kitchen   EKG Interpretation Date/Time:  Thursday February 25 2023 08:58:45 EST Ventricular Rate:  81 PR Interval:  156 QRS Duration:  68 QT Interval:  386 QTC Calculation: 448 R Axis:   4  Text Interpretation: Artifact Normal sinus rhythm Nonspecific ST and T wave abnormality When compared with ECG of 14-Aug-2022 HEART RATE has increased Otherwise no significant change Confirmed by Rainie Crenshaw, Cristal Deer [phone]) on 02/25/2023 1:08:21 PM    Pharmacologic MPI (10/31/2020): Low risk study without ischemia or scar.  LVEF greater than 65%.  Coronary artery calcification and aortic atherosclerosis noted. TTE (07/04/2020): Normal LV size and wall thickness.  LVEF 60-65% with normal wall motion.   Normal RV size and function.  Moderate left atrial enlargement.  No significant valvular abnormality. TTE (08/06/16): Normal LV size with LVEF of 55-65%. Normal wall motion with grade 1 diastolic dysfunction. Mild left atrial enlargement. Normal RV size and function. Exercise MPI (07/13/16): Low risk study without ischemia or scar. LVEF greater than 65%. Decreased exercise capacity, exercising 4 minutes, zero seconds, achieving 117 bpm (90% MPHR; 4.6 METs).  Risk Assessment/Calculations:    CHA2DS2-VASc Score = 4   This indicates a 4.8% annual risk of stroke. The patient's score is based upon: CHF History: 0 HTN History: 1 Diabetes History: 0 Stroke History: 0 Vascular Disease History: 0 Age Score: 2 Gender Score: 1            Physical Exam:   VS:  BP 122/60 (BP Location: Left Arm, Patient Position: Sitting, Cuff Size: Normal)   Pulse 81   Ht 4\' 11"  (1.499 m)   Wt 127 lb 4 oz (57.7 kg)   SpO2 97%   BMI 25.70 kg/m    Wt Readings from Last 3 Encounters:  02/25/23 127 lb 4 oz (57.7 kg)  12/08/22 128 lb 3.2 oz (58.2 kg)  08/14/22 127 lb 8 oz (57.8 kg)    General:  NAD. Neck: No JVD or HJR. Lungs: Clear to auscultation bilaterally without wheezes or crackles. Heart: Regular rate and rhythm without murmurs, rubs, or gallops. Abdomen: Soft, nontender, nondistended. Extremities: No lower extremity edema.  ASSESSMENT AND PLAN: .    Persistent  atrial fibrillation and hypercoagulable state: Erin [name] is maintaining sinus rhythm, though she has noted some increased heart rates on her smart watch at times.  We have agreed to defer further testing at this time and to continue her current regimen of amiodarone 100 mg dailyand apixaban 2.5 mg twice daily (based on age and weight).  Amiodarone toxicity monitoring labs and chest x-ray completed earlier this year and within normal limits.  Chest pain: Erin [name] reports sporadic atypical chest pain that seems positional when lying in bed.   She does not have any exertional symptoms.  Given atypical quality and reassuring MPI in 2022, I think it is reasonable to defer additional testing/intervention unless symptoms worsen.  Hypertension: Blood pressure well-controlled.  Continue benazepril.  Hyperlipidemia: Continue rosuvastatin 5 mg daily with ongoing management per Dr. Lorin Picket.    Dispo: Return to clinic in 6 months.  Signed, Yvonne Kendall, MD

## 2023-02-25 NOTE — Patient Instructions (Signed)
 Medication Instructions:  Your physician recommends that you continue on your current medications as directed. Please refer to the Current Medication list given to you today.   *If you need a refill on your cardiac medications before your next appointment, please call your pharmacy*   Lab Work: No labs ordered today    Testing/Procedures: No test ordered today    Follow-Up: At [facility], you and your health needs are our priority.  As part of our continuing mission to provide you with exceptional heart care, we have created designated Provider Care Teams.  These Care Teams include your primary Cardiologist (physician) and Advanced Practice Providers (APPs -  Physician Assistants and Nurse Practitioners) who all work together to provide you with the care you need, when you need it.  We recommend signing up for the patient portal called "MyChart".  Sign up information is provided on this After Visit Summary.  MyChart is used to connect with patients for Virtual Visits (Telemedicine).  Patients are able to view lab/test results, encounter notes, upcoming appointments, etc.  Non-urgent messages can be sent to your provider as well.   To learn more about what you can do with MyChart, go to [email or web address].    Your next appointment:   6 month(s)  Provider:   You may see Yvonne Kendall, MD or one of the following Advanced Practice Providers on your designated Care Team:   Nicolasa Ducking, NP Eula Listen, PA-C Cadence Fransico Michael, PA-C Charlsie Quest, NP Carlos Levering, NP

## 2023-05-07 ENCOUNTER — Other Ambulatory Visit: Payer: Self-pay | Admitting: Internal Medicine

## 2023-05-07 DIAGNOSIS — Z1231 Encounter for screening mammogram for malignant neoplasm of breast: Secondary | ICD-10-CM

## 2023-05-11 ENCOUNTER — Ambulatory Visit (INDEPENDENT_AMBULATORY_CARE_PROVIDER_SITE_OTHER): Payer: PPO | Admitting: *Deleted

## 2023-05-11 VITALS — BP 124/62 | HR 55 | Ht 59.0 in | Wt 126.0 lb

## 2023-05-11 DIAGNOSIS — Z0000 Encounter for general adult medical examination without abnormal findings: Secondary | ICD-10-CM | POA: Diagnosis not present

## 2023-05-11 NOTE — Progress Notes (Signed)
 Subjective:   Erin [name] is a 89 y.o. who presents for a Medicare Wellness preventive visit.  Visit Complete: Virtual I connected with  Mahalia Longest on 05/11/23 by a audio enabled telemedicine application and verified that I am speaking with the correct person using two identifiers.  Patient Location: Home  Provider Location: Office/Clinic  I discussed the limitations of evaluation and management by telemedicine. The patient expressed understanding and agreed to proceed.  Vital Signs: Because this visit was a virtual/telehealth visit, some criteria may be missing or patient reported. Any vitals not documented were not able to be obtained and vitals that have been documented are patient reported.  VideoDeclined- This patient declined Librarian, academic. Therefore the visit was completed with audio only.  AWV Questionnaire: No: Patient Medicare AWV questionnaire was not completed prior to this visit.  Cardiac Risk Factors include: advanced age (>69men, >80 women);dyslipidemia;hypertension;Other (see comment), Risk factor comments: AFib     Objective:    Today's Vitals   05/11/23 0855  BP: 124/62  Pulse: (!) 55  Weight: 126 lb (57.2 kg)  Height: 4\' 11"  (1.499 m)   Body mass index is 25.45 kg/m.     05/11/2023    9:08 AM 05/07/2022    9:08 AM 09/11/2021    9:03 AM 05/06/2021    2:13 PM 03/31/2021   10:21 AM 03/13/2021   10:52 AM 09/09/2020   10:17 AM  Advanced Directives  Does Patient Have a Medical Advance Directive? Yes Yes Yes Yes No No Yes  Type of Estate agent of [place];Living will Healthcare Power of [employer];Living will Healthcare Power of [place];Living will Healthcare Power of [place];Living will   Healthcare Power of [place];Living will  Does patient want to make changes to medical advance directive? No - Patient declined No - Patient declined  No - Patient declined     Copy of Healthcare Power of Attorney in  Chart? Yes - validated most recent copy scanned in chart (See row information) Yes - validated most recent copy scanned in chart (See row information)  Yes - validated most recent copy scanned in chart (See row information)     Would patient like information on creating a medical advance directive?     No - Patient declined No - Patient declined     Current Medications (verified) Outpatient Encounter Medications as of 05/11/2023  Medication Sig   acetaminophen (TYLENOL) 325 MG tablet Take 650 mg by mouth every 6 (six) hours as needed for moderate pain or headache.   amiodarone (PACERONE) 100 MG tablet Take 1 tablet (100 mg total) by mouth daily.   apixaban (ELIQUIS) 2.5 MG TABS tablet Take 1 tablet (2.5 mg total) by mouth 2 (two) times daily.   benazepril (LOTENSIN) 40 MG tablet Take 1 tablet (40 mg total) by mouth 2 (two) times daily.   Calcium Carbonate-Vitamin D 600-400 MG-UNIT tablet Take 1 tablet by mouth in the morning.   felodipine (PLENDIL) 2.5 MG 24 hr tablet Take 1 tablet (2.5 mg total) by mouth daily.   furosemide (LASIX) 20 MG tablet Take 1 tablet (20 mg total) by mouth daily as needed.   Multiple Vitamin (MULTIVITAMIN WITH MINERALS) TABS tablet Take 1 tablet by mouth in the morning.   rosuvastatin (CRESTOR) 5 MG tablet Take 1 tablet (5 mg total) by mouth daily.   No facility-administered encounter medications on file as of 05/11/2023.    Allergies (verified) Erythromycin and Minocin [minocycline hcl]  History: Past Medical History:  Diagnosis Date   A-fib (HCC)    Breast cancer (HCC)    Cancer (HCC)    melanoma - arm left   Chest pain 07/13/2016   History of colon polyps    Hypercholesterolemia    Hypertension    Personal history of radiation therapy    Past Surgical History:  Procedure Laterality Date   APPENDECTOMY  1946   BREAST BIOPSY Left 06/04/2020   [place] bx, vision marker, positive   BREAST LUMPECTOMY Left 06/24/2020   BREAST LUMPECTOMY WITH SENTINEL LYMPH  NODE BIOPSY Left 06/24/2020   Procedure: BREAST LUMPECTOMY WITH SENTINEL LYMPH NODE BIOPSY;  Surgeon: Earline Mayotte, MD;  Location: [facility] ORS;  Service: General;  Laterality: Left;   CARDIOVERSION N/A 07/30/2020   Procedure: CARDIOVERSION;  Surgeon: Yvonne Kendall, MD;  Location: [facility] ORS;  Service: Cardiovascular;  Laterality: N/A;   CARDIOVERSION N/A 09/03/2020   Procedure: CARDIOVERSION;  Surgeon: Yvonne Kendall, MD;  Location: [facility] ORS;  Service: Cardiovascular;  Laterality: N/A;   CATARACT EXTRACTION Bilateral    DILATION AND CURETTAGE OF UTERUS     LAMINECTOMY  2000   release of foraminal stenosis  2003   Family History  Problem Relation Age of Onset   Hypertension Mother    Hypertension Sister    Multiple sclerosis Sister        died 49   Heart disease Brother        S/P CABG   Breast cancer Maternal Aunt    Social History   Socioeconomic History   Marital status: Widowed    Spouse name: Not on file   Number of children: 4   Years of education: Not on file   Highest education level: Not on file  Occupational History   Occupation: retired  Tobacco Use   Smoking status: Former   Smokeless tobacco: Never  Vaping Use   Vaping status: Never Used  Substance and Sexual Activity   Alcohol use: Yes    Alcohol/week: 0.0 standard drinks of alcohol    Comment: occassionally   Drug use: No   Sexual activity: Never  Other Topics Concern   Not on file  Social History Narrative   Not on file   Social Drivers of Health   Financial Resource Strain: Low Risk  (05/11/2023)   Overall Financial Resource Strain (CARDIA)    Difficulty of Paying Living Expenses: Not hard at all  Food Insecurity: No Food Insecurity (05/11/2023)   Hunger Vital Sign    Worried About Running Out of Food in the Last Year: Never true    Ran Out of Food in the Last Year: Never true  Transportation Needs: No Transportation Needs (05/11/2023)   PRAPARE - Administrator, Civil Service  (Medical): No    Lack of Transportation (Non-Medical): No  Physical Activity: Sufficiently Active (05/11/2023)   Exercise Vital Sign    Days of Exercise per Week: 6 days    Minutes of Exercise per Session: 40 min  Stress: No Stress Concern Present (05/11/2023)   [employer] of Occupational Health - Occupational Stress Questionnaire    Feeling of Stress : Not at all  Social Connections: Moderately Integrated (05/11/2023)   Social Connection and Isolation Panel [NHANES]    Frequency of Communication with Friends and Family: More than three times a week    Frequency of Social Gatherings with Friends and Family: More than three times a week    Attends Religious Services: More than  4 times per year    Active Member of Clubs or Organizations: Yes    Attends Banker Meetings: More than 4 times per year    Marital Status: Widowed    Tobacco Counseling Counseling given: Not Answered    Clinical Intake:  Pre-visit preparation completed: Yes  Pain : No/denies pain     BMI - recorded: 25.45 Nutritional Status: BMI 25 -29 Overweight Nutritional Risks: None Diabetes: No  How often do you need to have someone help you when you read instructions, pamphlets, or other written materials from your doctor or pharmacy?: 1 - Never  Interpreter Needed?: No  Information entered by :: R, Sayre Witherington LPN   Activities of Daily Living     05/11/2023    8:57 AM  In your present state of health, do you have any difficulty performing the following activities:  Hearing? 1  Comment wears aids  Vision? 0  Difficulty concentrating or making decisions? 0  Walking or climbing stairs? 0  Dressing or bathing? 0  Doing errands, shopping? 0  Preparing Food and eating ? N  Using the Toilet? N  In the past six months, have you accidently leaked urine? N  Do you have problems with loss of bowel control? N  Managing your Medications? N  Managing your Finances? N  Housekeeping or managing your  Housekeeping? N  Comment once a month help    Patient Care Team: Dale Norman [place], MD as PCP - General (Internal Medicine) End, Cristal Deer, MD as PCP - Cardiology (Cardiology) Scarlett Presto, RN (Inactive) as Oncology Nurse Navigator Carmina Miller, MD as Consulting Physician (Radiation Oncology) Earna Coder, MD as Consulting Physician (Oncology) Lemar Livings Merrily Pew, MD as Consulting Physician (General Surgery) Kandyce Rud., MD as Consulting Physician (Rheumatology)  Indicate any recent Medical Services you may have received from other than [facility] providers in the past year (date may be approximate).     Assessment:   This is a routine wellness examination for Geanna.  Hearing/Vision screen Hearing Screening - Comments:: Wears aids Vision Screening - Comments:: Wears glasses   Goals Addressed             This Visit's Progress    Patient Stated       Wants to keep going and continue to walk and drive       Depression Screen     05/11/2023    9:03 AM 12/08/2022    9:00 AM 08/06/2022    8:30 AM 05/07/2022    9:01 AM 04/06/2022    7:33 AM 12/04/2021    9:05 AM 07/30/2021    8:31 AM  PHQ 2/9 Scores  PHQ - 2 Score 0 0 0 0 0 0 0  PHQ- 9 Score 0 1  0 1      Fall Risk     05/11/2023    9:00 AM 12/08/2022    9:00 AM 08/06/2022    8:30 AM 05/07/2022    9:01 AM 04/06/2022    7:33 AM  Fall Risk   Falls in the past year? 0 0 0 0 0  Number falls in past yr: 0 0 0 0 0  Injury with Fall? 0 0 0 0 0  Risk for fall due to : No Fall Risks No Fall Risks No Fall Risks  History of fall(s)  Follow up Falls prevention discussed;Falls evaluation completed Falls evaluation completed Falls evaluation completed Falls evaluation completed;Falls prevention discussed Falls evaluation completed  MEDICARE RISK AT HOME:  Medicare Risk at Home Any stairs in or around the home?: Yes If so, are there any without handrails?: No Home free of loose throw rugs in walkways, pet beds,  electrical cords, etc?: Yes Adequate lighting in your home to reduce risk of falls?: Yes Life alert?: Yes Use of a cane, walker or w/c?: Yes (at times) Grab bars in the bathroom?: Yes Shower chair or bench in shower?: Yes Elevated toilet seat or a handicapped toilet?: Yes  TIMED UP AND GO:  Was the test performed?  No  Cognitive Function: 6CIT completed    04/23/2015    9:47 AM  MMSE - Mini Mental State Exam  Orientation to time 5  Orientation to Place 5  Registration 3  Attention/ Calculation 5  Recall 3  Language- name 2 objects 2  Language- repeat 1  Language- follow 3 step command 3  Language- read & follow direction 1  Write a sentence 1  Copy design 1  Total score 30        05/11/2023    9:08 AM 05/07/2022    9:05 AM 04/30/2020   10:29 AM 04/28/2019    9:51 AM 04/26/2018    9:23 AM  6CIT Screen  What Year? 0 points 0 points 0 points 0 points 0 points  What month? 0 points 0 points 0 points 0 points 0 points  What time? 0 points 0 points 0 points 0 points 0 points  Count back from 20 0 points 0 points 0 points 0 points 0 points  Months in reverse 0 points 0 points 0 points 0 points 0 points  Repeat phrase 0 points 0 points  0 points 0 points  Total Score 0 points 0 points  0 points 0 points    Immunizations Immunization History  Administered Date(s) Administered   Fluad Quad(high Dose 65+) 11/25/2018, 12/16/2021   Influenza Split 12/05/2013   Influenza, High Dose Seasonal PF 11/24/2016, 12/02/2017, 12/16/2020   Influenza,inj,Quad PF,6+ Mos 11/30/2012   Influenza-Unspecified 11/14/2013, 12/11/2014, 12/06/2015   PFIZER(Purple Top)SARS-COV-2 Vaccination 04/06/2019, 04/27/2019, 01/04/2020, 07/16/2020   Pfizer Covid-19 Vaccine Bivalent Booster 59yrs & up 01/14/2021   Pneumococcal Conjugate-13 10/24/2014   Pneumococcal Polysaccharide-23 11/13/2015   Rsv, Bivalent, Protein Subunit Rsvpref,pf Verdis Frederickson) 02/26/2022   Tdap 05/31/2012   Zoster Recombinant(Shingrix)  12/23/2016, 03/26/2017   Zoster, Live 02/14/2011    Screening Tests Health Maintenance  Topic Date Due   DTaP/Tdap/Td (2 - Td or Tdap) 06/01/2022   COVID-19 Vaccine (6 - 2024-25 season) 11/15/2022   Medicare Annual Wellness (AWV)  05/08/2023   MAMMOGRAM  06/01/2023 (Originally 12/02/2022)   INFLUENZA VACCINE  06/14/2023 (Originally 10/15/2022)   Pneumonia Vaccine 81+ Years old  Completed   DEXA SCAN  Completed   Zoster Vaccines- Shingrix  Completed   HPV VACCINES  Aged Out    Health Maintenance  Health Maintenance Due  Topic Date Due   DTaP/Tdap/Td (2 - Td or Tdap) 06/01/2022   COVID-19 Vaccine (6 - 2024-25 season) 11/15/2022   Medicare Annual Wellness (AWV)  05/08/2023   Health Maintenance Items Addressed: See Nurse Notes  Additional Screening:  Vision Screening: Recommended annual ophthalmology exams for early detection of glaucoma and other disorders of the eye. Patient is up to date. [facility]  Dental Screening: Recommended annual dental exams for proper oral hygiene  Community Resource Referral / Chronic Care Management: CRR required this visit?  No   CCM required this visit?  No  Plan:     I have personally reviewed and noted the following in the patient's chart:   Medical and social history Use of alcohol, tobacco or illicit drugs  Current medications and supplements including opioid prescriptions. Patient is not currently taking opioid prescriptions. Functional ability and status Nutritional status Physical activity Advanced directives List of other physicians Hospitalizations, surgeries, and ER visits in previous 12 months Vitals Screenings to include cognitive, depression, and falls Referrals and appointments  In addition, I have reviewed and discussed with patient certain preventive protocols, quality metrics, and best practice recommendations. A written personalized care plan for preventive services as well as general preventive health  recommendations were provided to patient.     Sydell Axon, LPN   1/61/0960   After Visit Summary: (MyChart) Due to this being a telephonic visit, the after visit summary with patients personalized plan was offered to patient via MyChart   Notes: Nothing significant to report at this time.

## 2023-05-11 NOTE — Patient Instructions (Signed)
 Erin [name] , Thank you for taking time to come for your Medicare Wellness Visit. I appreciate your ongoing commitment to your health goals. Please review the following plan we discussed and let me know if I can assist you in the future.   Referrals/Orders/Follow-Ups/Clinician Recommendations: Remember to update your Tetanus vaccine.  This is a list of the screening recommended for you and due dates:  Health Maintenance  Topic Date Due   DTaP/Tdap/Td vaccine (2 - Td or Tdap) 06/01/2022   COVID-19 Vaccine (6 - 2024-25 season) 11/15/2022   Mammogram  06/01/2023*   Medicare Annual Wellness Visit  05/10/2024   Pneumonia Vaccine  Completed   Flu Shot  Completed   DEXA scan (bone density measurement)  Completed   Zoster (Shingles) Vaccine  Completed   HPV Vaccine  Aged Out  *Topic was postponed. The date shown is not the original due date.    Advanced directives: (In Chart) A copy of your advanced directives are scanned into your chart should your provider ever need it.  Next Medicare Annual Wellness Visit scheduled for next year: Yes 05/16/24 @ 10:50

## 2023-06-03 ENCOUNTER — Ambulatory Visit
Admission: RE | Admit: 2023-06-03 | Discharge: 2023-06-03 | Disposition: A | Discharge status: Home / Self Care | Payer: PPO | Source: Ambulatory Visit | Attending: Internal Medicine | Admitting: Internal Medicine

## 2023-06-03 DIAGNOSIS — Z1231 Encounter for screening mammogram for malignant neoplasm of breast: Secondary | ICD-10-CM | POA: Insufficient documentation

## 2023-06-04 ENCOUNTER — Telehealth: Payer: Self-pay | Admitting: Internal Medicine

## 2023-06-04 DIAGNOSIS — I10 Essential (primary) hypertension: Secondary | ICD-10-CM

## 2023-06-04 DIAGNOSIS — E785 Hyperlipidemia, unspecified: Secondary | ICD-10-CM

## 2023-06-04 DIAGNOSIS — E782 Mixed hyperlipidemia: Secondary | ICD-10-CM

## 2023-06-04 DIAGNOSIS — R739 Hyperglycemia, unspecified: Secondary | ICD-10-CM

## 2023-06-04 NOTE — Telephone Encounter (Signed)
 Patient need lab orders.

## 2023-06-07 ENCOUNTER — Other Ambulatory Visit (INDEPENDENT_AMBULATORY_CARE_PROVIDER_SITE_OTHER): Payer: PPO

## 2023-06-07 DIAGNOSIS — E782 Mixed hyperlipidemia: Secondary | ICD-10-CM

## 2023-06-07 DIAGNOSIS — R739 Hyperglycemia, unspecified: Secondary | ICD-10-CM

## 2023-06-07 DIAGNOSIS — I10 Essential (primary) hypertension: Secondary | ICD-10-CM

## 2023-06-07 DIAGNOSIS — E785 Hyperlipidemia, unspecified: Secondary | ICD-10-CM | POA: Diagnosis not present

## 2023-06-07 LAB — BASIC METABOLIC PANEL
BUN: 13 mg/dL (ref 6–23)
CO2: 28 meq/L (ref 19–32)
Calcium: 9.9 mg/dL (ref 8.4–10.5)
Chloride: 102 meq/L (ref 96–112)
Creatinine, Ser: 0.92 mg/dL (ref 0.40–1.20)
GFR: 54.55 mL/min — ABNORMAL LOW (ref >60.00)
Glucose, Bld: 99 mg/dL (ref 70–99)
Potassium: 4.1 meq/L (ref 3.5–5.1)
Sodium: 138 meq/L (ref 135–145)

## 2023-06-07 LAB — LIPID PANEL
Cholesterol: 179 mg/dL (ref 0–200)
HDL: 86.2 mg/dL (ref >39.00)
LDL Cholesterol: 77 mg/dL (ref 0–99)
NonHDL: 92.49
Total CHOL/HDL Ratio: 2
Triglycerides: 79 mg/dL (ref 0.0–149.0)
VLDL: 15.8 mg/dL (ref 0.0–40.0)

## 2023-06-07 LAB — CBC WITH DIFFERENTIAL/PLATELET
Basophils Absolute: 0 10*3/uL (ref 0.0–0.1)
Basophils Relative: 0.7 % (ref 0.0–3.0)
Eosinophils Absolute: 0.1 10*3/uL (ref 0.0–0.7)
Eosinophils Relative: 2 % (ref 0.0–5.0)
HCT: 44 % (ref 36.0–46.0)
Hemoglobin: 14.7 g/dL (ref 12.0–15.0)
Lymphocytes Relative: 23.3 % (ref 12.0–46.0)
Lymphs Abs: 1 10*3/uL (ref 0.7–4.0)
MCHC: 33.5 g/dL (ref 30.0–36.0)
MCV: 100.3 fl — ABNORMAL HIGH (ref 78.0–100.0)
Monocytes Absolute: 0.5 10*3/uL (ref 0.1–1.0)
Monocytes Relative: 12.3 % — ABNORMAL HIGH (ref 3.0–12.0)
Neutro Abs: 2.6 10*3/uL (ref 1.4–7.7)
Neutrophils Relative %: 61.7 % (ref 43.0–77.0)
Platelets: 196 10*3/uL (ref 150.0–400.0)
RBC: 4.38 Mil/uL (ref 3.87–5.11)
RDW: 13.6 % (ref 11.5–15.5)
WBC: 4.2 10*3/uL (ref 4.0–10.5)

## 2023-06-07 LAB — HEMOGLOBIN A1C: Hgb A1c MFr Bld: 5.6 % (ref 4.6–6.5)

## 2023-06-07 LAB — HEPATIC FUNCTION PANEL
ALT: 15 U/L (ref 0–35)
AST: 18 U/L (ref 0–37)
Albumin: 4.4 g/dL (ref 3.5–5.2)
Alkaline Phosphatase: 55 U/L (ref 39–117)
Bilirubin, Direct: 0.1 mg/dL (ref 0.0–0.3)
Total Bilirubin: 0.7 mg/dL (ref 0.2–1.2)
Total Protein: 6.9 g/dL (ref 6.0–8.3)

## 2023-06-08 ENCOUNTER — Encounter: Payer: Self-pay | Admitting: Internal Medicine

## 2023-06-08 ENCOUNTER — Ambulatory Visit (INDEPENDENT_AMBULATORY_CARE_PROVIDER_SITE_OTHER): Admitting: Internal Medicine

## 2023-06-08 VITALS — BP 120/70 | HR 55 | Temp 97.8°F | Ht 59.0 in | Wt 125.2 lb

## 2023-06-08 DIAGNOSIS — I10 Essential (primary) hypertension: Secondary | ICD-10-CM | POA: Diagnosis not present

## 2023-06-08 DIAGNOSIS — R739 Hyperglycemia, unspecified: Secondary | ICD-10-CM

## 2023-06-08 DIAGNOSIS — I4719 Other supraventricular tachycardia: Secondary | ICD-10-CM

## 2023-06-08 DIAGNOSIS — Z170 Estrogen receptor positive status [ER+]: Secondary | ICD-10-CM

## 2023-06-08 DIAGNOSIS — I4819 Other persistent atrial fibrillation: Secondary | ICD-10-CM

## 2023-06-08 DIAGNOSIS — E785 Hyperlipidemia, unspecified: Secondary | ICD-10-CM

## 2023-06-08 DIAGNOSIS — E782 Mixed hyperlipidemia: Secondary | ICD-10-CM

## 2023-06-08 DIAGNOSIS — C50412 Malignant neoplasm of upper-outer quadrant of left female breast: Secondary | ICD-10-CM

## 2023-06-08 DIAGNOSIS — F439 Reaction to severe stress, unspecified: Secondary | ICD-10-CM | POA: Diagnosis not present

## 2023-06-08 MED ORDER — BENAZEPRIL HCL 40 MG PO TABS: 40.0000 mg | ORAL_TABLET | Freq: Two times a day (BID) | ORAL | 1 refills | Status: DC

## 2023-06-08 NOTE — Progress Notes (Unsigned)
 Subjective:    Patient ID: Erin [name], female    DOB: [birth year], 89 y.o.   MRN: [id number]  Patient here for  Chief Complaint  Patient presents with   Medical Management of Chronic Issues    HPI Here for a scheduled follow up - follow up regarding hypercholesterolemia, hypertension and afib s/p cardioversion. Had f/u with cardiology 02/25/23 - recommended continuing amiodarone and eliquis. She continues to walk daily. No chest pain. Breathing stable. No increased cough or congestion. Handling stress - discussed.    Past Medical History:  Diagnosis Date   A-fib [facility])    Breast cancer (HCC)    Cancer (HCC)    melanoma - arm left   Chest pain 07/13/2016   History of colon polyps    Hypercholesterolemia    Hypertension    Personal history of radiation therapy    Past Surgical History:  Procedure Laterality Date   APPENDECTOMY  1946   BREAST BIOPSY Left 06/04/2020   [place] bx, vision marker, positive   BREAST LUMPECTOMY Left 06/24/2020   BREAST LUMPECTOMY WITH SENTINEL LYMPH NODE BIOPSY Left 06/24/2020   Procedure: BREAST LUMPECTOMY WITH SENTINEL LYMPH NODE BIOPSY;  Surgeon: Earline Mayotte, MD;  Location: [facility] ORS;  Service: General;  Laterality: Left;   CARDIOVERSION N/A 07/30/2020   Procedure: CARDIOVERSION;  Surgeon: Yvonne Kendall, MD;  Location: [facility] ORS;  Service: Cardiovascular;  Laterality: N/A;   CARDIOVERSION N/A 09/03/2020   Procedure: CARDIOVERSION;  Surgeon: Yvonne Kendall, MD;  Location: [facility] ORS;  Service: Cardiovascular;  Laterality: N/A;   CATARACT EXTRACTION Bilateral    DILATION AND CURETTAGE OF UTERUS     LAMINECTOMY  2000   release of foraminal stenosis  2003   Family History  Problem Relation Age of Onset   Hypertension Mother    Hypertension Sister    Multiple sclerosis Sister        died 68   Heart disease Brother        S/P CABG   Breast cancer Maternal Aunt    Social History   Socioeconomic History   Marital status: Widowed     Spouse name: Not on file   Number of children: 4   Years of education: Not on file   Highest education level: Not on file  Occupational History   Occupation: retired  Tobacco Use   Smoking status: Former   Smokeless tobacco: Never  Vaping Use   Vaping status: Never Used  Substance and Sexual Activity   Alcohol use: Yes    Alcohol/week: 0.0 standard drinks of alcohol    Comment: occassionally   Drug use: No   Sexual activity: Never  Other Topics Concern   Not on file  Social History Narrative   Not on file   Social Drivers of Health   Financial Resource Strain: Low Risk  (05/11/2023)   Overall Financial Resource Strain (CARDIA)    Difficulty of Paying Living Expenses: Not hard at all  Food Insecurity: No Food Insecurity (05/11/2023)   Hunger Vital Sign    Worried About Running Out of Food in the Last Year: Never true    Ran Out of Food in the Last Year: Never true  Transportation Needs: No Transportation Needs (05/11/2023)   PRAPARE - Administrator, Civil Service (Medical): No    Lack of Transportation (Non-Medical): No  Physical Activity: Sufficiently Active (05/11/2023)   Exercise Vital Sign    Days of Exercise per Week: 6 days  Minutes of Exercise per Session: 40 min  Stress: No Stress Concern Present (05/11/2023)   [employer] of Occupational Health - Occupational Stress Questionnaire    Feeling of Stress : Not at all  Social Connections: Moderately Integrated (05/11/2023)   Social Connection and Isolation Panel [NHANES]    Frequency of Communication with Friends and Family: More than three times a week    Frequency of Social Gatherings with Friends and Family: More than three times a week    Attends Religious Services: More than 4 times per year    Active Member of [employer] or Organizations: Yes    Attends Banker Meetings: More than 4 times per year    Marital Status: Widowed     Review of Systems  Constitutional:  Negative for  appetite change and unexpected weight change.  HENT:  Negative for congestion and sinus pressure.   Respiratory:  Negative for cough, chest tightness and shortness of breath.   Cardiovascular:  Negative for chest pain, palpitations and leg swelling.  Gastrointestinal:  Negative for abdominal pain, diarrhea, nausea and vomiting.  Genitourinary:  Negative for difficulty urinating and dysuria.  Musculoskeletal:  Negative for joint swelling and myalgias.  Skin:  Negative for color change and rash.  Neurological:  Negative for dizziness and headaches.  Psychiatric/Behavioral:  Negative for agitation and dysphoric mood.        Objective:     BP 120/70   Pulse (!) 55   Temp 97.8 F (36.6 C)   Ht 4\' 11"  (1.499 m)   Wt 125 lb 3.2 oz (56.8 kg)   SpO2 98%   BMI 25.29 kg/m  Wt Readings from Last 3 Encounters:  06/08/23 125 lb 3.2 oz (56.8 kg)  05/11/23 126 lb (57.2 kg)  02/25/23 127 lb 4 oz (57.7 kg)    Physical Exam Vitals reviewed.  Constitutional:      General: She is not in acute distress.    Appearance: Normal appearance.  HENT:     Head: Normocephalic and atraumatic.     Right Ear: External ear normal.     Left Ear: External ear normal.     Mouth/Throat:     Pharynx: No oropharyngeal exudate or posterior oropharyngeal erythema.  Eyes:     General: No scleral icterus.       Right eye: No discharge.        Left eye: No discharge.     Conjunctiva/sclera: Conjunctivae normal.  Neck:     Thyroid: No thyromegaly.  Cardiovascular:     Rate and Rhythm: Normal rate and regular rhythm.  Pulmonary:     Effort: No respiratory distress.     Breath sounds: Normal breath sounds. No wheezing.  Abdominal:     General: Bowel sounds are normal.     Palpations: Abdomen is soft.     Tenderness: There is no abdominal tenderness.  Musculoskeletal:        General: No swelling or tenderness.     Cervical back: Neck supple. No tenderness.  Lymphadenopathy:     Cervical: No cervical  adenopathy.  Skin:    Findings: No erythema or rash.  Neurological:     Mental Status: She is alert.  Psychiatric:        Mood and Affect: Mood normal.        Behavior: Behavior normal.         Outpatient Encounter Medications as of 06/08/2023  Medication Sig   acetaminophen (TYLENOL) 325 MG tablet Take  650 mg by mouth every 6 (six) hours as needed for moderate pain or headache.   amiodarone (PACERONE) 100 MG tablet Take 1 tablet (100 mg total) by mouth daily.   apixaban (ELIQUIS) 2.5 MG TABS tablet Take 1 tablet (2.5 mg total) by mouth 2 (two) times daily.   Calcium Carbonate-Vitamin D 600-400 MG-UNIT tablet Take 1 tablet by mouth in the morning.   felodipine (PLENDIL) 2.5 MG 24 hr tablet Take 1 tablet (2.5 mg total) by mouth daily.   FLUAD 0.5 ML injection    furosemide (LASIX) 20 MG tablet Take 1 tablet (20 mg total) by mouth daily as needed.   Multiple Vitamin (MULTIVITAMIN WITH MINERALS) TABS tablet Take 1 tablet by mouth in the morning.   rosuvastatin (CRESTOR) 5 MG tablet Take 1 tablet (5 mg total) by mouth daily.   benazepril (LOTENSIN) 40 MG tablet Take 1 tablet (40 mg total) by mouth 2 (two) times daily.   [DISCONTINUED] benazepril (LOTENSIN) 40 MG tablet Take 1 tablet (40 mg total) by mouth 2 (two) times daily.   No facility-administered encounter medications on file as of 06/08/2023.     Lab Results  Component Value Date   WBC 4.2 06/07/2023   HGB 14.7 06/07/2023   HCT 44.0 06/07/2023   PLT 196.0 06/07/2023   GLUCOSE 99 06/07/2023   CHOL 179 06/07/2023   TRIG 79.0 06/07/2023   HDL 86.20 06/07/2023   LDLCALC 77 06/07/2023   ALT 15 06/07/2023   AST 18 06/07/2023   NA 138 06/07/2023   K 4.1 06/07/2023   CL 102 06/07/2023   CREATININE 0.92 06/07/2023   BUN 13 06/07/2023   CO2 28 06/07/2023   TSH 0.99 12/03/2022   HGBA1C 5.6 06/07/2023    MM 3D SCREENING MAMMOGRAM BILATERAL BREAST Result Date: 06/04/2023 CLINICAL DATA:  Screening. EXAM: DIGITAL SCREENING  BILATERAL MAMMOGRAM WITH TOMOSYNTHESIS AND CAD TECHNIQUE: Bilateral screening digital craniocaudal and mediolateral oblique mammograms were obtained. Bilateral screening digital breast tomosynthesis was performed. The images were evaluated with computer-aided detection. COMPARISON:  Previous exam(s). ACR Breast Density Category b: There are scattered areas of fibroglandular density. FINDINGS: There are no findings suspicious for malignancy. IMPRESSION: No mammographic evidence of malignancy. A result letter of this screening mammogram will be mailed directly to the patient. RECOMMENDATION: Screening mammogram in one year. (Code:SM-B-01Y) BI-RADS CATEGORY  1: Negative. Electronically Signed   By: Ted Mcalpine M.D.   On: 06/04/2023 17:01       Assessment & Plan:  Persistent atrial fibrillation [facility]) Assessment & Plan: S/p cardioversion.  Appears to be in SR today.  Continues on eliquis and amiodarone.  Saw Dr End. Discussed intermittent low heart rate. She denies dizziness, light headedness, syncope or near syncope.  Cardiology recommended no further intervention. She feels - stable. Follow.    Essential hypertension Assessment & Plan: Continue benazepril.  On bid dosing. Follow pressures.  Follow metabolic panel. No change in medication.   Orders: -     Basic metabolic panel with GFR; Future  Hyperlipidemia, unspecified hyperlipidemia type -     Lipid panel; Future -     Hepatic function panel; Future  Hyperglycemia Assessment & Plan: Low carb diet and exercise. Follow met b and A1c.   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 S/p lumpectomy. S/p radiation. Holding on antiestrogen treatment. Continues  f/u with Dr Donneta Romberg. Continue surveillance.    Mixed hyperlipidemia Assessment & Plan: Continue  crestor. Low cholesterol diet and exercise. Follow lipid panel.    Narrow complex tachycardia (HCC) Assessment & Plan: Has seen cardiology. Stable. Follow.    Stress Assessment & Plan: Overall appears to be handling things relatively well. Follow    Other orders -     Benazepril HCl; Take 1 tablet (40 mg total) by mouth 2 (two) times daily.  Dispense: 180 tablet; Refill: 1     Dale Dumas, MD

## 2023-06-10 ENCOUNTER — Ambulatory Visit: Payer: PPO | Admitting: Internal Medicine

## 2023-06-11 ENCOUNTER — Encounter: Payer: Self-pay | Admitting: Internal Medicine

## 2023-06-11 NOTE — Assessment & Plan Note (Signed)
 Low-carb diet and exercise.  Follow met b and A1c.

## 2023-06-11 NOTE — Assessment & Plan Note (Signed)
Overall appears to be handling things relatively well.  Follow.   

## 2023-06-11 NOTE — Assessment & Plan Note (Signed)
 Continue benazepril.  On bid dosing. Follow pressures.  Follow metabolic panel. No change in medication.

## 2023-06-11 NOTE — Assessment & Plan Note (Signed)
Continue crestor.  Low cholesterol diet and exercise.  Follow lipid panel.  

## 2023-06-11 NOTE — Assessment & Plan Note (Signed)
Has seen cardiology.  Stable.  Follow.   

## 2023-06-11 NOTE — Assessment & Plan Note (Signed)
 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 S/p lumpectomy. S/p radiation. Holding on antiestrogen treatment. Continues f/u with Dr Donneta Romberg. Continue surveillance.

## 2023-06-11 NOTE — Assessment & Plan Note (Signed)
 S/p cardioversion.  Appears to be in SR today.  Continues on eliquis and amiodarone.  Saw Dr End. Discussed intermittent low heart rate. She denies dizziness, light headedness, syncope or near syncope.  Cardiology recommended no further intervention. She feels - stable. Follow.

## 2023-08-02 ENCOUNTER — Ambulatory Visit: Attending: Student | Admitting: Student

## 2023-08-02 ENCOUNTER — Encounter: Payer: Self-pay | Admitting: Student

## 2023-08-02 ENCOUNTER — Telehealth: Payer: Self-pay | Admitting: Internal Medicine

## 2023-08-02 VITALS — BP 150/70 | HR 59 | Ht 59.0 in | Wt 125.0 lb

## 2023-08-02 DIAGNOSIS — I10 Essential (primary) hypertension: Secondary | ICD-10-CM | POA: Diagnosis not present

## 2023-08-02 DIAGNOSIS — I491 Atrial premature depolarization: Secondary | ICD-10-CM

## 2023-08-02 DIAGNOSIS — I480 Paroxysmal atrial fibrillation: Secondary | ICD-10-CM

## 2023-08-02 DIAGNOSIS — I4892 Unspecified atrial flutter: Secondary | ICD-10-CM

## 2023-08-02 DIAGNOSIS — I493 Ventricular premature depolarization: Secondary | ICD-10-CM | POA: Diagnosis not present

## 2023-08-02 MED ORDER — AMIODARONE HCL 100 MG PO TABS: 100.0000 mg | ORAL_TABLET | Freq: Every day | ORAL | 3 refills | Status: DC

## 2023-08-02 MED ORDER — APIXABAN 2.5 MG PO TABS
ORAL_TABLET | ORAL | Status: DC
Start: 2023-08-02 — End: 2023-08-19

## 2023-08-02 MED ORDER — FELODIPINE ER 2.5 MG PO TB24: 2.5000 mg | ORAL_TABLET | Freq: Two times a day (BID) | ORAL | 3 refills | Status: DC

## 2023-08-02 NOTE — Telephone Encounter (Signed)
 Pt c/o BP issue: STAT if pt c/o blurred vision, one-sided weakness or slurred speech.  STAT if BP is GREATER than 180/120 TODAY.  STAT if BP is LESS than 90/60 and SYMPTOMATIC TODAY  1. What is your BP concern? Pt called in stating her bp has been elevated, she would like to come in for an appt if needed.   2. Have you taken any BP medication today? yes   3. What are your last 5 BP readings?  159/77 150/79  148/76 150/70 138/59   4. Are you having any other symptoms (ex. Dizziness, headache, blurred vision, passed out)? No

## 2023-08-02 NOTE — Patient Instructions (Addendum)
 Medication Instructions:  Your physician recommends the following medication changes.   INCREASE: Felodipine  to 2.5 mg one tablet 2 times daily.  Please check your Blood Pressure before your evening dose, and if the systolic number (the top number of the blood pressure) is below 120, hold the evening dose for that day. *If you need a refill on your cardiac medications before your next appointment, please call your pharmacy*  Lab Work: None ordered at this time  If you have labs (blood work) drawn today and your tests are completely normal, you will receive your results only by: MyChart Message (if you have MyChart) OR A paper copy in the mail If you have any lab test that is abnormal or we need to change your treatment, we will call you to review the results.  Testing/Procedures: None ordered at this time   Follow-Up:  You have an appointment scheduled with Dr. Nolan Battle on September 10, 2023 (09/10/23).  Please keep this appointment.  At the time of the appointment, please bring your blood pressure cuff and the blood pressure log with you for us  to review.   At [facility], you and your health needs are our priority.  As part of our continuing mission to provide you with exceptional heart care, our providers are all part of one team.  This team includes your primary Cardiologist (physician) and Advanced Practice Providers or APPs (Physician Assistants and Nurse Practitioners) who all work together to provide you with the care you need, when you need it.  We recommend signing up for the patient portal called "MyChart".  Sign up information is provided on this After Visit Summary.  MyChart is used to connect with patients for Virtual Visits (Telemedicine).  Patients are able to view lab/test results, encounter notes, upcoming appointments, etc.  Non-urgent messages can be sent to your provider as well.   To learn more about what you can do with MyChart, go to [email or web address].   Other  Instructions Please bring your blood pressure cuff and the blood pressure log with you to your next appointment.  Please check your blood pressure prior to taking the evening dose of your Felodipine , and if the systolic number (the top number) is below 120, hold the evening dose for that day.  Refills for Amiodarone  and Eliquis  have been sent to your pharmacy.

## 2023-08-02 NOTE — Telephone Encounter (Signed)
 Called patient, advised that she had a steady increase of her blood pressure over the last few months. Blood pressure readings below over the last few months, however yesterday and the last few days, blood pressure was 172/84, 165/65, 204/103 HR normally between 62-59.   Patient states this morning was 156/79 HR 72, and just a few minutes ago 142/68.   Patient does take her medications as prescribed on her medication list (Benazepril  40 mg BID, Amiodarone  100 mg daily, and Eliquis  2.5 mg BID)   Patient concerned with blood pressure and would like an appointment, scheduled with NP today at 2:45 PM.   Will route to NP to make aware.   Thank you!

## 2023-08-02 NOTE — Progress Notes (Signed)
 Cardiology Clinic Note   Date: 08/02/2023 ID: TRACY GERKEN, DOB [birth year], MRN [id number]  Primary Cardiologist:  Sammy Crisp, MD  Chief Complaint   Erin [name] is a 89 y.o. female who presents to the clinic today for evaluation of elevated BP.   Patient Profile   Erin [name] is followed by Dr. Nolan Battle for the history outlined below.      Past medical history significant for: PAF/a-flutter/PACs/PVCs. Onset April 2018. 14-day ZIO 10/07/2016: HR 35 to 147 bpm, average 68 bpm.  Frequent PACs.  Isolated PVCs.  1 run of NSVT lasting 5 beats max rate 203 bpm.  237 atrial runs longest lasting 19.4 seconds max HR 226 bpm.  No patient triggered events. Echo 07/04/2020: EF 60 to 65%.  No RWMA.  Indeterminate diastolic parameters.  Normal RV size/function.  Moderate LAE. DCCV 07/30/2020. DCCV 09/03/2020. Hypertension. Hyperlipidemia. Breast cancer.  In summary, patient was previously followed by Dr. Lina Render.  Patient underwent outpatient cardiac monitor in April 2018 which showed 20 episodes of narrow complex tachycardia with significant irregularity likely consistent with A-fib, PAC burden 5.3%.  She had a normal, low risk nuclear stress test. Echo demonstrated normal LV/RV function, Grade I DD, normal PA pressure, mild LAE.  She was started on anticoagulation.  She established care with Dr. Nolan Battle in July 2018.  Repeat cardiac monitoring as detailed above.  Patient was last seen in the office by Dr. Nolan Battle on 02/25/2023 for routine follow-up.  She reported sporadic atypical chest pain that was described as positional when lying in bed.  Blood pressure was well-controlled at that time.  Patient contacted the office on 08/02/2023 with complaints of elevated BP.  Last 5 BP readings: 159/77 150/79  148/76 150/70 138/59 Per triage RN: "Called patient, advised that she had a steady increase of her blood pressure over the last few months. Blood pressure readings below over the last few months, however  yesterday and the last few days, blood pressure was 172/84, 165/65, 204/103 HR normally between 62-59.    Patient states this morning was 156/79 HR 72, and just a few minutes ago 142/68."     History of Present Illness    Today, patient reports BP reading before coming in was 125/60. She denies headaches, lightheadedness, dizziness or vision changes. Her BP has previously been well controlled with readings in the 120s/60s. She is compliant with medications. No changes to her diet. She is otherwise doing well. Patient denies shortness of breath, dyspnea on exertion, lower extremity edema, orthopnea or PND. No chest pain, pressure, or tightness. No palpitations. She walks 1-1.5 miles six days a week weather permitting.     ROS: All other systems reviewed and are otherwise negative except as noted in History of Present Illness.  EKGs/Labs Reviewed    EKG Interpretation Date/Time:  Monday Aug 02 2023 14:44:49 EDT Ventricular Rate:  59 PR Interval:  174 QRS Duration:  78 QT Interval:  438 QTC Calculation: 433 R Axis:   18  Text Interpretation: Sinus bradycardia Nonspecific ST abnormality When compared with ECG of 25-Feb-2023 08:58, No significant changes Confirmed by Morey Ar [phone]) on 08/02/2023 2:51:49 PM   06/07/2023: ALT 15; AST 18; BUN 13; Creatinine, Ser 0.92; Potassium 4.1; Sodium 138   06/07/2023: Hemoglobin 14.7; WBC 4.2   12/03/2022: TSH 0.99    Risk Assessment/Calculations     CHA2DS2-VASc Score = 4   This indicates a 4.8% annual risk of stroke. The patient's score is based upon: CHF  History: 0 HTN History: 1 Diabetes History: 0 Stroke History: 0 Vascular Disease History: 0 Age Score: 2 Gender Score: 1    HYPERTENSION CONTROL Vitals:   08/02/23 1446 08/02/23 1525  BP: (!) 160/70 (!) 150/70    The patient's blood pressure is elevated above target today.  In order to address the patient's elevated BP: A current anti-hypertensive medication was  adjusted today.           Physical Exam    VS:  BP (!) 150/70 (BP Location: Left Arm, Patient Position: Sitting, Cuff Size: Normal)   Pulse (!) 59   Ht 4\' 11"  (1.499 m)   Wt 125 lb (56.7 kg)   SpO2 96%   BMI 25.25 kg/m  , BMI Body mass index is 25.25 kg/m.  GEN: Well nourished, well developed, in no acute distress. Neck: No JVD or carotid bruits. Cardiac:  RRR. No murmurs. No rubs or gallops.   Respiratory:  Respirations regular and unlabored. Clear to auscultation without rales, wheezing or rhonchi. GI: Soft, nontender, nondistended. Extremities: Radials/DP/PT 2+ and equal bilaterally. No clubbing or cyanosis. No edema.  Skin: Warm and dry, no rash. Neuro: Strength intact.  Assessment & Plan   PAF/a-flutter/PACs/PVCs S/p DCCV May and June 2022.  Denies spontaneous bleeding concerns.  Patient denies palpitations. EKG today demonstrates sinus bradycardia, 59 bpm.  - Continue amiodarone , Eliquis . Appropriate Eliquis  dose.  Hypertension BP today 160/70 on intake and 150/70 on my recheck. Home BP has been trending up with readings as high as 204/103. She did not bring her home cuff today. She denies headaches, lightheadedness, dizziness, or vision changes.  Will increase felodipine  to 2.5 mg bid. Patient will hold evening dose if SBP <120 mmHg.  - Continue benazepril . - Increase felodipine  to 2.5 mg bid. Patient will hold evening dose if SBP <120.  - BP log provided.  - She will bring home BP cuff to follow up visit.   Disposition: Increase felodipine  to 2.5 mg bid, hold evening dose if SBP <120. Keep previously scheduled follow up visit with Dr. Nolan Battle on 6/27. Bring BP log and home BP cuff to visit.          Signed, Lonell Rives. Jermesha Sottile, DNP, NP-C

## 2023-08-04 ENCOUNTER — Ambulatory Visit: Payer: Self-pay

## 2023-08-04 NOTE — Telephone Encounter (Signed)
 Chief Complaint: HTN Symptoms: asymptomatic  Frequency: since Sunday Pertinent Negatives: Patient denies all symptoms Disposition: []ED /[]Urgent Care (no appt availability in office) / [x]Appointment(In office/virtual)/ [] [facility] [facility]/ []Home Care/ []Refused Recommended Disposition /[][place] [facility]/ [] Follow-up with PCP Additional Notes: Pt reports recent high BP readings beginning Sunday. Pt takes amlodipine and benazepril. Pt was seen at her cardiologist\'s office on Monday and was advised to begin taking amlodipine twice a day instead of once a day. Since Monday evening pt has been taking it twice per day. Pt reports this AM before taking amlodipine her pressure was 170/"something." After taking amlodipine she took her pressure again and it was 147/70. Pt states yesterday her BP was 125/63 which is closer to her normal. Pt concerned about these high readings. Pt is completely asymptomatic. Pt states she walked 1.25 miles today. Pt states she usually walks 1.5 miles but cut her walk short because it was going to rain. RN scheduled pt for tomorrow at 0900 and asked the pt to continue monitoring her BP and call us back if her systolic is 200 or higher or her diastolic is 120 or higher. RN advised pt if she develops CP, SOB, severe h/A, vomiting, dizziness, or one-sided weakness she needs to go to the ED. Pt verbalized understanding.     Copied from CRM #[id number]. Topic: Clinical - Red Word Triage >> Aug 04, 2023  9:08 AM Erin [name] wrote: Red Word that prompted transfer to Nurse Triage: patient blood pressure over 200 on Sunday, seen cardiologist Monday and was advised to take extra amlodipine medication but its not working - blood pressure today 147/70 and wants to know what to do. Reason for Disposition  [1] Systolic BP  >= 130 OR Diastolic >= 80 AND [2] taking BP medications  Answer Assessment - Initial Assessment Questions 1. BLOOD PRESSURE: "What is the blood pressure?" "Did  you take at least two measurements 5 minutes apart?"     14 7/70 is her latest BP after taking AM amlodipine, 170/"something" before amlodipine this AM; high BP started Sunday - 140/60-something; when pt returned from church it was 170/60-something. Later Sunday afternoon systolic was 200. 2. ONSET: "When did you take your blood pressure?"     Today  3. HOW: "How did you take your blood pressure?" (e.g., automatic home BP monitor, visiting nurse)     Automatic home BP cuff  4. HISTORY: "Do you have a history of high blood pressure?"     yes 5. MEDICINES: "Are you taking any medicines for blood pressure?" "Have you missed any doses recently?"     Amlodipine, benazepril   6. OTHER SYMPTOMS: "Do you have any symptoms?" (e.g., blurred vision, chest pain, difficulty breathing, headache, weakness)     Pt was asked by cardiologist to double up on her amlodipine starting Monday evening at 1730. 125/63 yesterday, that is normal for her. Denies CP or SOB. Denies dizziness and H/A. Denies blurry vision. Denies one-sided weakness or numbness. Denies slurred speech. Pt states she walks 1.5 miles every day and walked 1.25 miles this AM.  Protocols used: Blood Pressure - High-A-AH

## 2023-08-05 ENCOUNTER — Encounter: Payer: Self-pay | Admitting: Family Medicine

## 2023-08-05 ENCOUNTER — Ambulatory Visit (INDEPENDENT_AMBULATORY_CARE_PROVIDER_SITE_OTHER): Admitting: Family Medicine

## 2023-08-05 VITALS — BP 138/68 | HR 64 | Temp 97.6°F | Resp 20 | Ht 59.0 in | Wt 125.0 lb

## 2023-08-05 DIAGNOSIS — I10 Essential (primary) hypertension: Secondary | ICD-10-CM | POA: Diagnosis not present

## 2023-08-05 NOTE — Telephone Encounter (Signed)
 Pt seen in office 08/05/2023  to discuss with Dr. Sueanne Emerald

## 2023-08-05 NOTE — Progress Notes (Unsigned)
 SUBJECTIVE:   Chief Complaint  Patient presents with   Hypertension    Gone up Sunday   HPI Presents for acute visit  Discussed the use of AI scribe software for clinical note transcription with the patient, who gave verbal consent to proceed.  History of Present Illness Erin [name] is a 89 year old female with hypertension who presents with concerns about elevated blood pressure.  Her blood pressure, typically in the 120s over 60s, began to rise on Sunday, reaching over 200 mmHg at one point. She is worried about this significant elevation.  She has been taking felodipine , initially one dose in the morning, but after consulting with a nurse practitioner, she started taking an additional dose in the evening. She takes her medications at 5:30 AM and 5:30 PM, noting improvement in her blood pressure two hours post-medication.  Her current medication regimen includes benazepril  40 mg, one in the morning and one in the evening, and Eliquis , which she continues as prescribed. She also takes amiodarone , one tablet in the morning, which she started in March 2022 for atrial fibrillation.  She uses Lasix  as needed for ankle swelling and has not taken it this week. Her cardiologist's goal for her blood pressure is in the 120s over 60s.  She is concerned about her prescription for felodipine , which was filled in April for one dose per day, and the insurance will not cover the increased dosage until July.     PERTINENT PMH / PSH: As above  OBJECTIVE:  BP 138/68   Pulse 64   Temp 97.6 F (36.4 C)   Resp 20   Ht 4\' 11"  (1.499 m)   Wt 125 lb (56.7 kg)   SpO2 98%   BMI 25.25 kg/m    Physical Exam Vitals reviewed.  Constitutional:      General: She is not in acute distress.    Appearance: Normal appearance. She is normal weight. She is not ill-appearing, toxic-appearing or diaphoretic.  Eyes:     General:        Right eye: No discharge.        Left eye: No discharge.      Conjunctiva/sclera: Conjunctivae normal.  Cardiovascular:     Rate and Rhythm: Normal rate and regular rhythm.     Heart sounds: Normal heart sounds.  Pulmonary:     Effort: Pulmonary effort is normal.     Breath sounds: Normal breath sounds. No wheezing or rhonchi.  Abdominal:     General: Bowel sounds are normal.  Musculoskeletal:        General: No swelling. Normal range of motion.     Right lower leg: No edema.     Left lower leg: No edema.  Skin:    General: Skin is warm and dry.  Neurological:     General: No focal deficit present.     Mental Status: She is alert and oriented to person, place, and time. Mental status is at baseline.  Psychiatric:        Mood and Affect: Mood normal.        Behavior: Behavior normal.        Thought Content: Thought content normal.        Judgment: Judgment normal.           08/05/2023    9:31 AM 06/08/2023   11:01 AM 05/11/2023    9:03 AM 12/08/2022    9:00 AM 08/06/2022    8:30 AM  Depression screen  PHQ 2/9  Decreased Interest 0 0 0 0 0  Down, Depressed, Hopeless 0 0 0 0 0  PHQ - 2 Score 0 0 0 0 0  Altered sleeping 0 1 0 0   Tired, decreased energy 1 1 0 1   Change in appetite 0 0 0 0   Feeling bad or failure about yourself  0 0 0 0   Trouble concentrating 0 0 0 0   Moving slowly or fidgety/restless 0 0 0 0   Suicidal thoughts 0 0 0 0   PHQ-9 Score 1 2 0 1   Difficult doing work/chores Not difficult at all Not difficult at all Not difficult at all Not difficult at all       08/05/2023    9:31 AM 06/08/2023   11:01 AM 12/08/2022    9:00 AM 08/06/2022    8:31 AM  GAD 7 : Generalized Anxiety Score  Nervous, Anxious, on Edge 0 0 0 0  Control/stop worrying 0 0 0 0  Worry too much - different things 0 0 0 1  Trouble relaxing 0 0 0 0  Restless 0 0 0 0  Easily annoyed or irritable 0 0 0 0  Afraid - awful might happen 0 0 0 0  Total GAD 7 Score 0 0 0 1  Anxiety Difficulty Not difficult at all Not difficult at all Not difficult at  all     ASSESSMENT/PLAN:  Essential hypertension Assessment & Plan: Hypertension with recent elevated readings, now controlled at 138/68 mmHg. Felodipine  dosing increased to twice daily for better control. Insurance coverage issues for increased dosing noted. - Monitor blood pressure closely and record. - Ensure medication supply continuity. - Spoke with Cardiology NP and agrees with no changes to medications at this time. Also recommended to decrease frequency of BP monitoring.   Patient has appointment with electrophysiology in June.  - PCP also aware - Patient has been updated with Cardiology recommendations and agreeable.     PDMP reviewed  Return for PCP.  Valli Gaw, MD

## 2023-08-05 NOTE — Patient Instructions (Addendum)
 It was a pleasure meeting you today. Thank you for allowing me to take part in your health care.  Our goals for today as we discussed include:  Continue with current medications for blood pressure  I have reached out to your Cardiology NP to let her know what your blood pressure has been and if she wants any changes.    I have also reached out to Dr Geralyn Knee to make her aware of your blood pressure.   I will call the pharmacy to ensure that you do not run out of medications.  If Dr Geralyn Knee or Cardiology has any further recommendations I will let you know.  Continue to monitor your blood pressure  Bring in your blood pressure cuff at your next visit to ensure accurate readings  Follow up with Cardiology as scheduled  Follow up with PCP as scheduled  This is a list of the screening recommended for you and due dates:  Health Maintenance  Topic Date Due   COVID-19 Vaccine (6 - 2024-25 season) 11/15/2022   Flu Shot  10/15/2023   Mammogram  12/04/2023   Medicare Annual Wellness Visit  05/10/2024   DTaP/Tdap/Td vaccine (3 - Td or Tdap) 05/18/2033   Pneumonia Vaccine  Completed   DEXA scan (bone density measurement)  Completed   Zoster (Shingles) Vaccine  Completed   HPV Vaccine  Aged Out   Meningitis B Vaccine  Aged Out      If you have any questions or concerns, please do not hesitate to call the office at [phone].  I look forward to our next visit and until then take care and stay safe.  Regards,   Valli Gaw, MD   [facility]

## 2023-08-09 ENCOUNTER — Encounter: Payer: Self-pay | Admitting: Family Medicine

## 2023-08-09 NOTE — Assessment & Plan Note (Signed)
 Hypertension with recent elevated readings, now controlled at 138/68 mmHg. Felodipine  dosing increased to twice daily for better control. Insurance coverage issues for increased dosing noted. - Monitor blood pressure closely and record. - Ensure medication supply continuity. - Spoke with Cardiology NP and agrees with no changes to medications at this time. Also recommended to decrease frequency of BP monitoring.   Patient has appointment with electrophysiology in June.  - PCP also aware - Patient has been updated with Cardiology recommendations and agreeable.

## 2023-08-10 ENCOUNTER — Other Ambulatory Visit: Payer: Self-pay | Admitting: Internal Medicine

## 2023-08-10 ENCOUNTER — Telehealth: Payer: Self-pay | Admitting: Student

## 2023-08-10 MED ORDER — AMIODARONE HCL 200 MG PO TABS: 100.0000 mg | ORAL_TABLET | Freq: Every day | ORAL | 3 refills | Status: AC

## 2023-08-10 NOTE — Telephone Encounter (Signed)
 Pt c/o medication issue:  1. Name of Medication:   amiodarone  (PACERONE ) 100 MG tablet    2. How are you currently taking this medication (dosage and times per day)? As written  3. Are you having a reaction (difficulty breathing--STAT)? No   4. What is your medication issue? Pt was getting 200 mg tablets and cutting them in half. New script was sent in for 100 mg  and it cost her $100 for 3 months. The 200 mg for 3 months cost her nothing. Pt wants script changed back to 200 mg.

## 2023-08-10 NOTE — Telephone Encounter (Signed)
 Patient reports that the Amiodarone  200 mg was no costing her anything. She prefers to take Amiodarone  1/2 tablet once daily. Updated prescription sent into her pharmacy. She was appreciative with no further questions.

## 2023-08-19 ENCOUNTER — Other Ambulatory Visit: Payer: Self-pay | Admitting: Internal Medicine

## 2023-08-19 DIAGNOSIS — I480 Paroxysmal atrial fibrillation: Secondary | ICD-10-CM

## 2023-08-19 NOTE — Telephone Encounter (Signed)
 Prescription refill request for Eliquis  received. Indication: afib  Last office visit: [place] 08/02/2023 Scr:  0.92, 06/07/2023 Age: 89 yo  Weight: 56.7 kg   Refill sent.

## 2023-08-23 ENCOUNTER — Other Ambulatory Visit: Payer: Self-pay

## 2023-08-23 ENCOUNTER — Encounter: Payer: Self-pay | Admitting: Internal Medicine

## 2023-08-23 MED ORDER — BENAZEPRIL HCL 40 MG PO TABS
40.0000 mg | ORAL_TABLET | Freq: Every day | ORAL | 1 refills | Status: DC
Start: 2023-08-23 — End: 2023-10-20

## 2023-08-23 MED ORDER — FELODIPINE ER 5 MG PO TB24: 5.0000 mg | ORAL_TABLET | Freq: Two times a day (BID) | ORAL | 1 refills | Status: DC

## 2023-08-23 NOTE — Telephone Encounter (Signed)
 Called patient, made aware of the following recommendations from MD.   Just to clarify increasing Felodipine  to 5 mg twice daily, and decreasing Benazepril  40 mg daily    Updated RX to pharmacy. Patient verbalized understanding.

## 2023-08-23 NOTE — Telephone Encounter (Signed)
 Pt c/o BP issue: STAT if pt c/o blurred vision, one-sided weakness or slurred speech.  STAT if BP is GREATER than 180/120 TODAY.  STAT if BP is LESS than 90/60 and SYMPTOMATIC TODAY  1. What is your BP concern? Hypertension even after meds  2. Have you taken any BP medication today? Yes, around 5:30 am   3. What are your last 5 BP readings? 08/23/23 around 7:30 am 145/71  08/22/23 after church 170/40's 7:30/8:00 pm 153/77  4. Are you having any other symptoms (ex. Dizziness, headache, blurred vision, passed out)? Blurry vision in left eye. States this symptom began occurring last week and is an active symptom now.     Call transferred to triage due to blurred vision.

## 2023-08-23 NOTE — Telephone Encounter (Signed)
 Patient called back in, stating that Erin [name] was having issues with BP again over the weekend. Erin [name] states Erin [name] checks it often, and yesterday it was 170/200, at one time ti went to 200/? Erin [name] was unsure of bottom number.   Erin [name] states the best Erin [name] was able to get it yesterday was 153/77 HR 63, Erin [name] states Erin [name] took her Felodipine  BID as requested, and Erin [name] took an extra Benapril yesterday as well due to the BP readings.   Patient only symptom to report is fatigue (no dizziness, lightheaded, passing out, or headache) patient states Erin [name] has been having some blurriness in her left eye, that is pretty consistent, it does not change.   Patient aware I will send a message to Dr.End to review and receive further recommendations.   Patient given ED precautions for blurry eyes if continued elevated readings.

## 2023-08-23 NOTE — Telephone Encounter (Signed)
 I recommend that Ms. Erin [name] make the following medication changes:  - Reduce benazepril  to 40 mg daily (this is the maximum daily dose that I typically prescribe).  - Increase flow to pain to 5 mg twice daily.  - Continue working on sodium restriction with ongoing home blood pressure monitoring.  Ms. Erin [name] should follow-up with me as scheduled later this month.  If she develops symptoms or notices further rise in her blood pressure in the meantime, she should seek immediate medical attention.  Sammy Crisp, MD [facility]

## 2023-08-23 NOTE — Telephone Encounter (Signed)
 Erin [name]

## 2023-09-10 ENCOUNTER — Ambulatory Visit: Attending: Internal Medicine | Admitting: Internal Medicine

## 2023-09-10 ENCOUNTER — Ambulatory Visit
Admission: RE | Admit: 2023-09-10 | Discharge: 2023-09-10 | Disposition: A | Discharge status: Home / Self Care | Attending: Internal Medicine | Admitting: Internal Medicine

## 2023-09-10 ENCOUNTER — Encounter: Payer: Self-pay | Admitting: Internal Medicine

## 2023-09-10 ENCOUNTER — Ambulatory Visit
Admission: RE | Admit: 2023-09-10 | Discharge: 2023-09-10 | Disposition: A | Discharge status: Home / Self Care | Source: Ambulatory Visit | Attending: Internal Medicine | Admitting: Internal Medicine

## 2023-09-10 ENCOUNTER — Other Ambulatory Visit
Admission: RE | Admit: 2023-09-10 | Discharge: 2023-09-10 | Disposition: A | Discharge status: Home / Self Care | Source: Home / Self Care | Attending: Internal Medicine | Admitting: Internal Medicine

## 2023-09-10 VITALS — BP 136/54 | HR 54 | Ht 59.0 in | Wt 127.0 lb

## 2023-09-10 DIAGNOSIS — I700 Atherosclerosis of aorta: Secondary | ICD-10-CM | POA: Diagnosis not present

## 2023-09-10 DIAGNOSIS — I10 Essential (primary) hypertension: Secondary | ICD-10-CM

## 2023-09-10 DIAGNOSIS — R0789 Other chest pain: Secondary | ICD-10-CM

## 2023-09-10 DIAGNOSIS — R0602 Shortness of breath: Secondary | ICD-10-CM | POA: Diagnosis not present

## 2023-09-10 DIAGNOSIS — R0989 Other specified symptoms and signs involving the circulatory and respiratory systems: Secondary | ICD-10-CM

## 2023-09-10 DIAGNOSIS — I4892 Unspecified atrial flutter: Secondary | ICD-10-CM

## 2023-09-10 DIAGNOSIS — E782 Mixed hyperlipidemia: Secondary | ICD-10-CM

## 2023-09-10 DIAGNOSIS — I4819 Other persistent atrial fibrillation: Secondary | ICD-10-CM

## 2023-09-10 DIAGNOSIS — I493 Ventricular premature depolarization: Secondary | ICD-10-CM | POA: Diagnosis not present

## 2023-09-10 DIAGNOSIS — R0609 Other forms of dyspnea: Secondary | ICD-10-CM | POA: Diagnosis not present

## 2023-09-10 DIAGNOSIS — I517 Cardiomegaly: Secondary | ICD-10-CM | POA: Insufficient documentation

## 2023-09-10 DIAGNOSIS — I771 Stricture of artery: Secondary | ICD-10-CM | POA: Diagnosis not present

## 2023-09-10 LAB — CBC
HCT: 40.7 % (ref 36.0–46.0)
Hemoglobin: 13.5 g/dL (ref 12.0–15.0)
MCH: 32.9 pg (ref 26.0–34.0)
MCHC: 33.2 g/dL (ref 30.0–36.0)
MCV: 99.3 fL (ref 80.0–100.0)
Platelets: 193 10*3/uL (ref 150–400)
RBC: 4.1 MIL/uL (ref 3.87–5.11)
RDW: 13.7 % (ref 11.5–15.5)
WBC: 5.8 10*3/uL (ref 4.0–10.5)
nRBC: 0 % (ref 0.0–0.2)

## 2023-09-10 LAB — TSH: TSH: 0.735 u[IU]/mL (ref 0.350–4.500)

## 2023-09-10 NOTE — Progress Notes (Unsigned)
 Cardiology Office Note:  .   Date:  09/11/2023  ID:  Erin [name], DOB 08-11-1930, MRN [id number] PCP: Glendia Shad, MD  [facility] Providers Cardiologist:  Lonni Hanson, MD     History of Present Illness: .   Erin [name] is a 89 y.o. female persistent atrial fibrillation, hypertension, hyperlipidemia, and breast cancer, who presents for follow-up of atrial fibrillation and hypertension.  She was seen last month by Barnie Hila, NP, due to elevated home BP readings.  BP was moderately elevated at the visit, prompting escalation of felodipine  to 2.5 mg twice daily (to be held for SBP less than 120 mmHg).  She has recently been taking felodipine  5 mg QAM and 2.5 QPM, which has kept her BP fairly well-controlled, though she has noted some spikes over the last couple of months up to 200 mmHg systolic.  Today, Erin [name] notes that she has been feeling more tired than she is accustomed to.  She used to be able to walk 1.5 miles every morning without any difficulty but now often finds that she needs to rest when going a mile.  This began fairly suddenly on 08/01/2022, which is around the time that she noticed a few BP readings up to 200 mmHg.  She notes exertional dyspnea as well as sporadic left upper chest pain when lying in bed (this seems to be positional).  She has not had any exertional chest pain, palpitations, or lightheadedness.  Erin [name] has some mild LE edema, for which she has been using furosemide  about twice a week on average.  ROS: See HPI  Studies Reviewed: SABRA   EKG Interpretation Date/Time:  Friday September 10 2023 09:18:31 EDT Ventricular Rate:  54 PR Interval:  190 QRS Duration:  72 QT Interval:  362 QTC Calculation: 343 R Axis:   8  Text Interpretation: Sinus bradycardia Low voltage QRS Nonspecific T wave abnormality When compared with ECG of 02-Aug-2023 14:44, Nonspecific ST abnormality is less pronounced Nonspecific T wave abnormality is now Present Confirmed by  Imogine Carvell, Lonni [phone]) on 09/10/2023 9:24:00 AM    Pharmacologic MPI (10/31/2020): Low risk study without ischemia or scar.  LVEF > 65%.  Coronary artery calcification and aortic atherosclerosis noted on the attenuation correction CT.  Risk Assessment/Calculations:    CHA2DS2-VASc Score = 4   This indicates a 4.8% annual risk of stroke. The patient's score is based upon: CHF History: 0 HTN History: 1 Diabetes History: 0 Stroke History: 0 Vascular Disease History: 0 Age Score: 2 Gender Score: 1            Physical Exam:   VS:  BP (!) 136/54 (BP Location: Left Arm)   Pulse (!) 54   Ht 4' 11 (1.499 m)   Wt 127 lb (57.6 kg)   SpO2 97%   BMI 25.65 kg/m    Wt Readings from Last 3 Encounters:  09/10/23 127 lb (57.6 kg)  08/05/23 125 lb (56.7 kg)  08/02/23 125 lb (56.7 kg)    General:  NAD. Neck: No JVD or HJR. Lungs: Clear to auscultation bilaterally without wheezes or crackles. Heart: Bradycardic but regular with 3/6 systolic murmur. Abdomen: Soft, nontender, nondistended. Extremities: Trace pretibial edema bilaterally.  ASSESSMENT AND PLAN: .    Dyspnea on exertion and atypical chest pain: Erin [name] reports sudden onset of exertional dyspnea on May 18th, which has persisted since then.  She notes sporadic BP spikes around that time as well, though most of her BP readings have  been quite good with current regimen of felodipine .  She also notes some positional chest pain when lying in bed that does not sound cardiac in nature.  She has a fairly pronounced systolic murmur on exam today.  I will check a CBC and TSH today (given long-term amiodarone  therapy) and also obtain a chest radiograph.  We will also arrange for an echocardiogram at her earliest convenience.  We will defer repeat ischemia testing for now, given non-cardiac nature of her chest pain and low-risk Myoview  in 10/2020.  Persistent atrial fibrillation: Erin [name] is maintaining sinus rhythm.  Continue apixaban  2.5  mg BID and amiodarone  100 mg daily.  Check CBC and TSH today; LFT's in 05/2023 were normal.  Hyperlipidemia and aortic atherosclerosis: LDL reasonable on last check in 05/2023 at 77.  Continue low-dose rosuvastatin .  Labile hypertension: Most home BP readings have been at goal, though Erin [name] reports sporadic spikes up to 200 mmHg.  It is possible that some of her recent LE edema may be due to increased dose of felodipine .  We will continue with felodipine  5 mg QAM and 2.5 mg QPM, as well as benazepril  40 mg daily.    Dispo: Return to clinic in 1 month.  Signed, Lonni Hanson, MD

## 2023-09-10 NOTE — Patient Instructions (Addendum)
 Medication Instructions:  Your physician recommends that you continue on your current medications as directed. Please refer to the Current Medication list given to you today.    *If you need a refill on your cardiac medications before your next appointment, please call your pharmacy*  Lab Work: Your provider would like for you to have following labs drawn today (CBC, TSH).     Testing/Procedures: Your physician has requested that you have an echocardiogram. Echocardiography is a painless test that uses sound waves to create images of your heart. It provides your doctor with information about the size and shape of your heart and how well your heart's chambers and valves are working.   You may receive an ultrasound enhancing agent through an IV if needed to better visualize your heart during the echo. This procedure takes approximately one hour.  There are no restrictions for this procedure.  This will take place at [address] [facility] [address], [place] [address]  Please note: We ask at that you not bring children with you during ultrasound (echo/ vascular) testing. Due to room size and safety concerns, children are not allowed in the ultrasound rooms during exams. Our front office staff cannot provide observation of children in our lobby area while testing is being conducted. An adult accompanying a patient to their appointment will only be allowed in the ultrasound room at the discretion of the ultrasound technician under special circumstances. We apologize for any inconvenience.   Your provider has ordered a chest X-Ray for you. You can have this done at the [facility] medical mall. You do not need an appointment. Please go to the entrance of the Medical Mall and check in at the front desk.    Follow-Up: At [facility], you and your health needs are our priority.  As part of our continuing mission to provide you with exceptional heart care, our providers are all part  of one team.  This team includes your primary Cardiologist (physician) and Advanced Practice Providers or APPs (Physician Assistants and Nurse Practitioners) who all work together to provide you with the care you need, when you need it.  Your next appointment:   1 month(s)  Provider:   You may see Lonni Hanson, MD or one of the following Advanced Practice Providers on your designated Care Team:   Lonni Meager, NP Lesley Maffucci, PA-C Bernardino Bring, PA-C Cadence Kittrell, PA-C Tylene Lunch, NP Barnie Hila, NP

## 2023-09-11 ENCOUNTER — Encounter: Payer: Self-pay | Admitting: Internal Medicine

## 2023-09-11 ENCOUNTER — Ambulatory Visit: Payer: Self-pay | Admitting: Internal Medicine

## 2023-09-11 DIAGNOSIS — R0609 Other forms of dyspnea: Secondary | ICD-10-CM | POA: Insufficient documentation

## 2023-09-11 DIAGNOSIS — I700 Atherosclerosis of aorta: Secondary | ICD-10-CM | POA: Insufficient documentation

## 2023-09-11 DIAGNOSIS — R0989 Other specified symptoms and signs involving the circulatory and respiratory systems: Secondary | ICD-10-CM | POA: Insufficient documentation

## 2023-09-14 ENCOUNTER — Encounter: Payer: Self-pay | Admitting: Emergency Medicine

## 2023-09-14 ENCOUNTER — Emergency Department

## 2023-09-14 ENCOUNTER — Emergency Department
Admission: EM | Admit: 2023-09-14 | Discharge: 2023-09-15 | Disposition: A | Discharge status: Home / Self Care | Attending: Emergency Medicine | Admitting: Emergency Medicine

## 2023-09-14 ENCOUNTER — Other Ambulatory Visit: Payer: Self-pay

## 2023-09-14 DIAGNOSIS — I10 Essential (primary) hypertension: Secondary | ICD-10-CM | POA: Insufficient documentation

## 2023-09-14 DIAGNOSIS — I2510 Atherosclerotic heart disease of native coronary artery without angina pectoris: Secondary | ICD-10-CM | POA: Diagnosis not present

## 2023-09-14 DIAGNOSIS — R079 Chest pain, unspecified: Secondary | ICD-10-CM | POA: Insufficient documentation

## 2023-09-14 DIAGNOSIS — M7989 Other specified soft tissue disorders: Secondary | ICD-10-CM | POA: Diagnosis not present

## 2023-09-14 DIAGNOSIS — I77810 Thoracic aortic ectasia: Secondary | ICD-10-CM | POA: Diagnosis not present

## 2023-09-14 DIAGNOSIS — Z7901 Long term (current) use of anticoagulants: Secondary | ICD-10-CM | POA: Diagnosis not present

## 2023-09-14 DIAGNOSIS — R0602 Shortness of breath: Secondary | ICD-10-CM | POA: Diagnosis not present

## 2023-09-14 DIAGNOSIS — M79605 Pain in left leg: Secondary | ICD-10-CM | POA: Diagnosis not present

## 2023-09-14 DIAGNOSIS — R0789 Other chest pain: Secondary | ICD-10-CM | POA: Diagnosis not present

## 2023-09-14 DIAGNOSIS — M549 Dorsalgia, unspecified: Secondary | ICD-10-CM | POA: Diagnosis not present

## 2023-09-14 DIAGNOSIS — M546 Pain in thoracic spine: Secondary | ICD-10-CM | POA: Diagnosis not present

## 2023-09-14 DIAGNOSIS — R0609 Other forms of dyspnea: Secondary | ICD-10-CM | POA: Insufficient documentation

## 2023-09-14 LAB — BASIC METABOLIC PANEL WITH GFR
Anion gap: 11 (ref 5–15)
BUN: 18 mg/dL (ref 8–23)
CO2: 22 mmol/L (ref 22–32)
Calcium: 10.3 mg/dL (ref 8.9–10.3)
Chloride: 103 mmol/L (ref 98–111)
Creatinine, Ser: 1.31 mg/dL — ABNORMAL HIGH (ref 0.44–1.00)
GFR, Estimated: 38 mL/min — ABNORMAL LOW (ref >60)
Glucose, Bld: 141 mg/dL — ABNORMAL HIGH (ref 70–99)
Potassium: 4.3 mmol/L (ref 3.5–5.1)
Sodium: 136 mmol/L (ref 135–145)

## 2023-09-14 LAB — CBC
HCT: 42 % (ref 36.0–46.0)
Hemoglobin: 14.1 g/dL (ref 12.0–15.0)
MCH: 33.1 pg (ref 26.0–34.0)
MCHC: 33.6 g/dL (ref 30.0–36.0)
MCV: 98.6 fL (ref 80.0–100.0)
Platelets: 213 10*3/uL (ref 150–400)
RBC: 4.26 MIL/uL (ref 3.87–5.11)
RDW: 13.9 % (ref 11.5–15.5)
WBC: 5.5 10*3/uL (ref 4.0–10.5)
nRBC: 0 % (ref 0.0–0.2)

## 2023-09-14 LAB — TROPONIN I (HIGH SENSITIVITY)
Troponin I (High Sensitivity): 17 ng/L (ref <18)
Troponin I (High Sensitivity): 14 ng/L (ref <18)

## 2023-09-14 MED ORDER — LIDOCAINE 5 % EX PTCH
1.0000 | MEDICATED_PATCH | CUTANEOUS | Status: DC
  Administered 2023-09-14: 1 via TRANSDERMAL
  Filled 2023-09-14: qty 1

## 2023-09-14 MED ORDER — SODIUM CHLORIDE 0.9 % IV BOLUS
500.0000 mL | Freq: Once | INTRAVENOUS | Status: AC
  Administered 2023-09-14: 500 mL via INTRAVENOUS

## 2023-09-14 MED ORDER — IOHEXOL 350 MG/ML SOLN
60.0000 mL | Freq: Once | INTRAVENOUS | Status: AC | PRN
  Administered 2023-09-14: 60 mL via INTRAVENOUS

## 2023-09-14 MED ORDER — IOHEXOL 350 MG/ML SOLN: 75.0000 mL | Freq: Once | INTRAVENOUS | Status: DC | PRN

## 2023-09-14 NOTE — ED Provider Notes (Signed)
 [facility] Provider Note    Event Date/Time   First MD Initiated Contact with Patient 09/14/23 1647     (approximate)   History   Chief Complaint Chest Pain   HPI  Erin [name] is a 89 y.o. female with past medical history of hypertension, hyperlipidemia, and atrial fibrillation on Eliquis  who presents to the ED complaining of chest pain.  Patient reports that she has been dealing with intermittent dull discomfort in the left side of her chest for about the past 6 weeks.  Pain does not seem to be associated with exertion and is not exacerbated by anything else, but she does report pain radiating towards her back at times.  She has noticed some increase swelling in her legs, particularly her left lower extremity, and endorses some dyspnea on exertion.  She has not had any fever or cough, denies difficulty breathing at rest.  She continues to take Eliquis  as prescribed for atrial fibrillation.     Physical Exam   Triage Vital Signs: ED Triage Vitals  Encounter Vitals Group     BP 09/14/23 1448 (!) 154/92     Girls Systolic BP Percentile --      Girls Diastolic BP Percentile --      Boys Systolic BP Percentile --      Boys Diastolic BP Percentile --      Pulse Rate 09/14/23 1448 75     Resp 09/14/23 1448 18     Temp 09/14/23 1448 98.4 F (36.9 C)     Temp Source 09/14/23 1448 Oral     SpO2 09/14/23 1448 98 %     Weight 09/14/23 1449 123 lb (55.8 kg)     Height 09/14/23 1449 4' 11 (1.499 m)     Head Circumference --      Peak Flow --      Pain Score 09/14/23 1448 5     Pain Loc --      Pain Education --      Exclude from Growth Chart --     Most recent vital signs: Vitals:   09/14/23 1900 09/14/23 1924  BP: 129/64   Pulse: (!) 51   Resp: 18   Temp:  98.1 F (36.7 C)  SpO2: 94%     Constitutional: Alert and oriented. Eyes: Conjunctivae are normal. Head: Atraumatic. Nose: No congestion/rhinnorhea. Mouth/Throat: Mucous membranes are  moist.  Cardiovascular: Normal rate, regular rhythm. Grossly normal heart sounds.  2+ radial and DP pulses bilaterally. Respiratory: Normal respiratory effort.  No retractions. Lungs CTAB.  No chest wall tenderness to palpation. Gastrointestinal: Soft and nontender. No distention. Musculoskeletal: 1+ pitting edema to left lower extremity, trace pitting edema to right lower extremity without tenderness or erythema.  Left thoracic back tenderness to palpation just below the scapula. Neurologic:  Normal speech and language. No gross focal neurologic deficits are appreciated.    ED Results / Procedures / Treatments   Labs (all labs ordered are listed, but only abnormal results are displayed) Labs Reviewed  BASIC METABOLIC PANEL WITH GFR - Abnormal; Notable for the following components:      Result Value   Glucose, Bld 141 (*)    Creatinine, Ser 1.31 (*)    GFR, Estimated 38 (*)    All other components within normal limits  CBC  TROPONIN I (HIGH SENSITIVITY)  TROPONIN I (HIGH SENSITIVITY)     EKG  ED ECG REPORT I, Carlin Palin, the attending physician, personally viewed and  interpreted this ECG.   Date: 09/14/2023  EKG Time: 14:52  Rate: 63  Rhythm: normal sinus rhythm  Axis: Normal  Intervals:none  ST&T Change: None  RADIOLOGY CXR reviewed and interpreted by me with no infiltrate, edema, or effusion.  PROCEDURES:  Critical Care performed: No  Procedures   MEDICATIONS ORDERED IN ED: Medications  lidocaine  (LIDODERM ) 5 % 1 patch (1 patch Transdermal Patch Applied 09/14/23 1814)  sodium chloride  0.9 % bolus 500 mL (0 mLs Intravenous Stopped 09/14/23 2013)  iohexol (OMNIPAQUE) 350 MG/ML injection 60 mL (60 mLs Intravenous Contrast Given 09/14/23 1921)     IMPRESSION / MDM / ASSESSMENT AND PLAN / ED COURSE  I reviewed the triage vital signs and the nursing notes.                              89 y.o. female with past medical history of hypertension, hyperlipidemia, and  atrial fibrillation on Eliquis  who presents to the ED for 6 weeks of increasing discomfort in the left side of her chest as well as left upper back pain with dyspnea on exertion.  Patient's presentation is most consistent with acute presentation with potential threat to life or bodily function.  Differential diagnosis includes, but is not limited to, ACS, PE, dissection, pneumonia, pneumothorax, musculoskeletal pain, GERD, anxiety.  Patient nontoxic-appearing and in no acute distress, vital signs are unremarkable.  EKG shows no evidence of arrhythmia or ischemia, initial troponin within normal limits.  We will check second set troponin given intermittent symptoms, also check CTA chest to rule out PE.  She has more pronounced swelling of her left lower extremity, will check ultrasound for evidence of DVT.  Labs show mild AKI but no significant anemia, leukocytosis, electrolyte abnormality.  Chest x-ray is also unremarkable.  Her back pain is reproducible with palpation and I suspect musculoskeletal etiology, patient declines pain medication but is willing to try Lidoderm  patch.  Ultrasound is negative for DVT, CTA chest is negative for PE or other acute finding.  Repeat troponin is unremarkable, patient's back pain improved following Lidoderm  patch.  Given reassuring workup, she is appropriate for discharge home with outpatient cardiology follow-up.  She was counseled to return to the ED for new or worsening symptoms, patient and son agree with plan.      FINAL CLINICAL IMPRESSION(S) / ED DIAGNOSES   Final diagnoses:  Nonspecific chest pain  Dyspnea on exertion  Upper back pain on left side     Rx / DC Orders   ED Discharge Orders          Ordered    Ambulatory referral to Cardiology        09/14/23 2039             Note:  This document was prepared using Dragon voice recognition software and may include unintentional dictation errors.   Willo Dunnings, MD 09/14/23 2040

## 2023-09-14 NOTE — ED Triage Notes (Signed)
 Patient to ED via POV for left sided CP. Ongoing since Saturday. Pain radiating to left shoulder and back. Hx afib

## 2023-09-30 ENCOUNTER — Ambulatory Visit: Attending: Internal Medicine

## 2023-09-30 DIAGNOSIS — R0609 Other forms of dyspnea: Secondary | ICD-10-CM

## 2023-09-30 LAB — ECHOCARDIOGRAM COMPLETE
AR max vel: 2.23 cm2
AV Area VTI: 2.13 cm2
AV Area mean vel: 2.13 cm2
AV Mean grad: 7 mmHg
AV Peak grad: 14.1 mmHg
Ao pk vel: 1.88 m/s
Area-P 1/2: 3.21 cm2
S' Lateral: 3.05 cm

## 2023-10-11 ENCOUNTER — Other Ambulatory Visit (INDEPENDENT_AMBULATORY_CARE_PROVIDER_SITE_OTHER)

## 2023-10-11 DIAGNOSIS — R739 Hyperglycemia, unspecified: Secondary | ICD-10-CM

## 2023-10-11 DIAGNOSIS — I10 Essential (primary) hypertension: Secondary | ICD-10-CM | POA: Diagnosis not present

## 2023-10-11 DIAGNOSIS — E785 Hyperlipidemia, unspecified: Secondary | ICD-10-CM

## 2023-10-11 LAB — HEPATIC FUNCTION PANEL
ALT: 20 U/L (ref 0–35)
AST: 18 U/L (ref 0–37)
Albumin: 4.4 g/dL (ref 3.5–5.2)
Alkaline Phosphatase: 66 U/L (ref 39–117)
Bilirubin, Direct: 0.2 mg/dL (ref 0.0–0.3)
Total Bilirubin: 0.8 mg/dL (ref 0.2–1.2)
Total Protein: 6.7 g/dL (ref 6.0–8.3)

## 2023-10-11 LAB — BASIC METABOLIC PANEL WITH GFR
BUN: 18 mg/dL (ref 6–23)
CO2: 26 meq/L (ref 19–32)
Calcium: 9.8 mg/dL (ref 8.4–10.5)
Chloride: 100 meq/L (ref 96–112)
Creatinine, Ser: 1.08 mg/dL (ref 0.40–1.20)
GFR: 44.89 mL/min — ABNORMAL LOW (ref >60.00)
Glucose, Bld: 101 mg/dL — ABNORMAL HIGH (ref 70–99)
Potassium: 4.2 meq/L (ref 3.5–5.1)
Sodium: 137 meq/L (ref 135–145)

## 2023-10-11 LAB — LIPID PANEL
Cholesterol: 163 mg/dL (ref 0–200)
HDL: 80.3 mg/dL (ref >39.00)
LDL Cholesterol: 65 mg/dL (ref 0–99)
NonHDL: 82.22
Total CHOL/HDL Ratio: 2
Triglycerides: 85 mg/dL (ref 0.0–149.0)
VLDL: 17 mg/dL (ref 0.0–40.0)

## 2023-10-11 LAB — HEMOGLOBIN A1C: Hgb A1c MFr Bld: 5.7 % (ref 4.6–6.5)

## 2023-10-12 ENCOUNTER — Ambulatory Visit: Payer: Self-pay | Admitting: Internal Medicine

## 2023-10-14 ENCOUNTER — Ambulatory Visit (INDEPENDENT_AMBULATORY_CARE_PROVIDER_SITE_OTHER): Admitting: Internal Medicine

## 2023-10-14 VITALS — BP 130/70 | HR 74 | Resp 16 | Ht 59.0 in | Wt 123.6 lb

## 2023-10-14 DIAGNOSIS — I10 Essential (primary) hypertension: Secondary | ICD-10-CM

## 2023-10-14 DIAGNOSIS — R739 Hyperglycemia, unspecified: Secondary | ICD-10-CM | POA: Diagnosis not present

## 2023-10-14 DIAGNOSIS — M79674 Pain in right toe(s): Secondary | ICD-10-CM

## 2023-10-14 DIAGNOSIS — Z0000 Encounter for general adult medical examination without abnormal findings: Secondary | ICD-10-CM | POA: Diagnosis not present

## 2023-10-14 DIAGNOSIS — M25512 Pain in left shoulder: Secondary | ICD-10-CM

## 2023-10-14 DIAGNOSIS — I4819 Other persistent atrial fibrillation: Secondary | ICD-10-CM | POA: Diagnosis not present

## 2023-10-14 DIAGNOSIS — I700 Atherosclerosis of aorta: Secondary | ICD-10-CM | POA: Diagnosis not present

## 2023-10-14 DIAGNOSIS — E785 Hyperlipidemia, unspecified: Secondary | ICD-10-CM | POA: Diagnosis not present

## 2023-10-14 DIAGNOSIS — I4719 Other supraventricular tachycardia: Secondary | ICD-10-CM

## 2023-10-14 DIAGNOSIS — E782 Mixed hyperlipidemia: Secondary | ICD-10-CM

## 2023-10-14 DIAGNOSIS — M25519 Pain in unspecified shoulder: Secondary | ICD-10-CM | POA: Insufficient documentation

## 2023-10-14 MED ORDER — ROSUVASTATIN CALCIUM 5 MG PO TABS: 5.0000 mg | ORAL_TABLET | Freq: Every day | ORAL | 3 refills | Status: AC

## 2023-10-14 NOTE — Progress Notes (Signed)
 Subjective:    Patient ID: Erin [name], female    DOB: 09/11/1930, 89 y.o.   MRN: [id number]  Patient here for  Chief Complaint  Patient presents with   Medical Management of Chronic Issues    HPI Here for a scheduled physical. Evaluated in ED 09/14/23 - chest pain. Also reported some DOE and leg swelling. Ultrasound - negative for DVT. CTA - negative for PE or other acute finding. Back pain improved with lidoderm  patch.  Prior to ED visit,saw cardiology with increased fatigue and exertional dyspnea and leg swelling. CBC and TSH wnl. CXR ok. ECHO - EF 60-65% with aortic valve sclerosis. She is still walking daily. Her breathing and fatigue is better than previous. Still having left posterior shoulder pain. Reproducible with palpation. Certain positions aggravate. No abdominal pain. Bowels moving. She did injure her right third toe this am. Bruised. Plans to discuss with podiatry.    Past Medical History:  Diagnosis Date   A-fib [facility])    Breast cancer (HCC)    Cancer (HCC)    melanoma - arm left   Chest pain 07/13/2016   History of colon polyps    Hypercholesterolemia    Hypertension    Personal history of radiation therapy    Past Surgical History:  Procedure Laterality Date   APPENDECTOMY  1946   BREAST BIOPSY Left 06/04/2020   us  bx, vision marker, positive   BREAST LUMPECTOMY Left 06/24/2020   BREAST LUMPECTOMY WITH SENTINEL LYMPH NODE BIOPSY Left 06/24/2020   Procedure: BREAST LUMPECTOMY WITH SENTINEL LYMPH NODE BIOPSY;  Surgeon: Dessa Reyes ORN, MD;  Location: [facility] ORS;  Service: General;  Laterality: Left;   CARDIOVERSION N/A 07/30/2020   Procedure: CARDIOVERSION;  Surgeon: Mady Bruckner, MD;  Location: [facility] ORS;  Service: Cardiovascular;  Laterality: N/A;   CARDIOVERSION N/A 09/03/2020   Procedure: CARDIOVERSION;  Surgeon: Mady Bruckner, MD;  Location: [facility] ORS;  Service: Cardiovascular;  Laterality: N/A;   CATARACT EXTRACTION Bilateral    DILATION AND  CURETTAGE OF UTERUS     LAMINECTOMY  2000   release of foraminal stenosis  2003   Family History  Problem Relation Age of Onset   Hypertension Mother    Hypertension Sister    Multiple sclerosis Sister        died 21   Heart disease Brother        S/P CABG   Breast cancer Maternal Aunt    Social History   Socioeconomic History   Marital status: Widowed    Spouse name: Not on file   Number of children: 4   Years of education: Not on file   Highest education level: Not on file  Occupational History   Occupation: retired  Tobacco Use   Smoking status: Former   Smokeless tobacco: Never  Vaping Use   Vaping status: Never Used  Substance and Sexual Activity   Alcohol use: Yes    Alcohol/week: 0.0 standard drinks of alcohol    Comment: occassionally   Drug use: No   Sexual activity: Never  Other Topics Concern   Not on file  Social History Narrative   Not on file   Social Drivers of Health   Financial Resource Strain: Low Risk  (05/11/2023)   Overall Financial Resource Strain (CARDIA)    Difficulty of Paying Living Expenses: Not hard at all  Food Insecurity: No Food Insecurity (05/11/2023)   Hunger Vital Sign    Worried About Running Out of Food in the Last  Year: Never true    Ran Out of Food in the Last Year: Never true  Transportation Needs: No Transportation Needs (05/11/2023)   PRAPARE - Administrator, Civil Service (Medical): No    Lack of Transportation (Non-Medical): No  Physical Activity: Sufficiently Active (05/11/2023)   Exercise Vital Sign    Days of Exercise per Week: 6 days    Minutes of Exercise per Session: 40 min  Stress: No Stress Concern Present (05/11/2023)   [employer] of Occupational Health - Occupational Stress Questionnaire    Feeling of Stress : Not at all  Social Connections: Moderately Integrated (05/11/2023)   Social Connection and Isolation Panel    Frequency of Communication with Friends and Family: More than three  times a week    Frequency of Social Gatherings with Friends and Family: More than three times a week    Attends Religious Services: More than 4 times per year    Active Member of [employer] or Organizations: Yes    Attends Banker Meetings: More than 4 times per year    Marital Status: Widowed     Review of Systems  Constitutional:  Negative for appetite change and unexpected weight change.  HENT:  Negative for congestion, sinus pressure and sore throat.   Eyes:  Negative for pain and visual disturbance.  Respiratory:  Negative for cough and chest tightness.        Some sob as outlined.   Cardiovascular:  Negative for palpitations and leg swelling.       Evaluated ER - chest pain.   Gastrointestinal:  Negative for abdominal pain, nausea and vomiting.  Genitourinary:  Negative for difficulty urinating and dysuria.  Musculoskeletal:  Negative for joint swelling and myalgias.       Left posterior shoulder pain.   Skin:  Negative for color change and rash.  Neurological:  Negative for dizziness and headaches.  Hematological:  Negative for adenopathy. Does not bruise/bleed easily.  Psychiatric/Behavioral:  Negative for agitation and dysphoric mood.        Objective:     BP 130/70   Pulse 74   Resp 16   Ht 4' 11 (1.499 m)   Wt 123 lb 9.6 oz (56.1 kg)   SpO2 98%   BMI 24.96 kg/m  Wt Readings from Last 3 Encounters:  10/14/23 123 lb 9.6 oz (56.1 kg)  09/14/23 123 lb (55.8 kg)  09/10/23 127 lb (57.6 kg)    Physical Exam Vitals reviewed.  Constitutional:      General: She is not in acute distress.    Appearance: Normal appearance. She is well-developed.  HENT:     Head: Normocephalic and atraumatic.     Right Ear: External ear normal.     Left Ear: External ear normal.     Mouth/Throat:     Pharynx: No oropharyngeal exudate or posterior oropharyngeal erythema.  Eyes:     General: No scleral icterus.       Right eye: No discharge.        Left eye: No  discharge.     Conjunctiva/sclera: Conjunctivae normal.  Neck:     Thyroid : No thyromegaly.  Cardiovascular:     Rate and Rhythm: Normal rate and regular rhythm.  Pulmonary:     Effort: No tachypnea, accessory muscle usage or respiratory distress.     Breath sounds: Normal breath sounds. No decreased breath sounds or wheezing.  Chest:  Breasts:    Right: No inverted nipple,  mass, nipple discharge or tenderness (no axillary adenopathy).     Left: No inverted nipple, mass, nipple discharge or tenderness (no axilarry adenopathy).  Abdominal:     General: Bowel sounds are normal.     Palpations: Abdomen is soft.     Tenderness: There is no abdominal tenderness.  Musculoskeletal:        General: No swelling.     Cervical back: Neck supple. No tenderness.     Comments: Reproducible pain to palpation - left posterior shoulder.   Lymphadenopathy:     Cervical: No cervical adenopathy.  Skin:    Findings: No erythema or rash.  Neurological:     Mental Status: She is alert and oriented to person, place, and time.  Psychiatric:        Mood and Affect: Mood normal.        Behavior: Behavior normal.         Outpatient Encounter Medications as of 10/14/2023  Medication Sig   felodipine  (PLENDIL ) 2.5 MG 24 hr tablet Take 2.5 mg by mouth daily.   acetaminophen  (TYLENOL ) 325 MG tablet Take 650 mg by mouth every 6 (six) hours as needed for moderate pain or headache.   amiodarone  (PACERONE ) 200 MG tablet Take 0.5 tablets (100 mg total) by mouth daily.   apixaban  (ELIQUIS ) 2.5 MG TABS tablet Take 1 tablet (2.5 mg total) by mouth 2 (two) times daily.   benazepril  (LOTENSIN ) 40 MG tablet Take 1 tablet (40 mg total) by mouth daily.   Calcium  Carbonate-Vitamin D 600-400 MG-UNIT tablet Take 1 tablet by mouth in the morning.   furosemide  (LASIX ) 20 MG tablet Take 1 tablet (20 mg total) by mouth daily as needed.   Multiple Vitamin (MULTIVITAMIN WITH MINERALS) TABS tablet Take 1 tablet by mouth in the  morning.   rosuvastatin  (CRESTOR ) 5 MG tablet Take 1 tablet (5 mg total) by mouth daily.   [DISCONTINUED] felodipine  (PLENDIL ) 5 MG 24 hr tablet Take 1 tablet (5 mg total) by mouth 2 (two) times daily. Check Blood Pressure prior to evening dose, and if the Systolic (top) number is below 120, please hold the evening dose.   [DISCONTINUED] rosuvastatin  (CRESTOR ) 5 MG tablet Take 1 tablet (5 mg total) by mouth daily.   No facility-administered encounter medications on file as of 10/14/2023.     Lab Results  Component Value Date   WBC 5.5 09/14/2023   HGB 14.1 09/14/2023   HCT 42.0 09/14/2023   PLT 213 09/14/2023   GLUCOSE 101 (H) 10/11/2023   CHOL 163 10/11/2023   TRIG 85.0 10/11/2023   HDL 80.30 10/11/2023   LDLCALC 65 10/11/2023   ALT 20 10/11/2023   AST 18 10/11/2023   NA 137 10/11/2023   K 4.2 10/11/2023   CL 100 10/11/2023   CREATININE 1.08 10/11/2023   BUN 18 10/11/2023   CO2 26 10/11/2023   TSH 0.735 09/10/2023   HGBA1C 5.7 10/11/2023    CT Angio Chest PE W/Cm &/Or Wo Cm Result Date: 09/14/2023 CLINICAL DATA:  Provided history: Pulmonary embolism (PE) suspected, high prob 89 year old with left-sided chest pain. EXAM: CT ANGIOGRAPHY CHEST WITH CONTRAST TECHNIQUE: Multidetector CT imaging of the chest was performed using the standard protocol during bolus administration of intravenous contrast. Multiplanar CT image reconstructions and MIPs were obtained to evaluate the vascular anatomy. RADIATION DOSE REDUCTION: This exam was performed according to the departmental dose-optimization program which includes automated exposure control, adjustment of the mA and/or kV according to patient size and/or  use of iterative reconstruction technique. CONTRAST:  60mL OMNIPAQUE  IOHEXOL  350 MG/ML SOLN COMPARISON:  Radiograph earlier today.  CT 06/11/2016 FINDINGS: Cardiovascular: There are no filling defects within the pulmonary arteries to suggest pulmonary embolus. Dilated main pulmonary artery at  3.5 cm. The heart is enlarged. Contrast refluxes into the hepatic veins and IVC. Aortic atherosclerosis and tortuosity. Dilated ascending aorta at 4 cm. Coronary artery calcifications. No pericardial effusion. Mediastinum/Nodes: No suspicious lymphadenopathy. Decompressed esophagus. Lungs/Pleura: Subsegmental atelectasis in the right middle lobe. No confluent consolidation. No pleural effusion. No features of pulmonary edema. The trachea and central airways are clear. Upper Abdomen: Contrast refluxes into the hepatic veins and IVC. Left renal cyst. No further follow-up imaging is recommended. Musculoskeletal: Thoracic spondylosis. There are no acute or suspicious osseous abnormalities. Review of the MIP images confirms the above findings. IMPRESSION: 1. No pulmonary embolus. 2. Cardiomegaly. Contrast refluxes into the hepatic veins and IVC, suggesting right heart dysfunction. 3. Dilated main pulmonary artery, can be seen with pulmonary arterial hypertension. Aortic Atherosclerosis (ICD10-I70.0). Electronically Signed   By: Andrea Gasman M.D.   On: 09/14/2023 19:45   US  Venous Img Lower Unilateral Left Result Date: 09/14/2023 CLINICAL DATA:  Left leg pain and swelling x1 week. EXAM: LEFT LOWER EXTREMITY VENOUS DOPPLER ULTRASOUND TECHNIQUE: Gray-scale sonography with graded compression, as well as color Doppler and duplex ultrasound were performed to evaluate the lower extremity deep venous systems from the level of the common femoral vein and including the common femoral, femoral, profunda femoral, popliteal and calf veins including the posterior tibial, peroneal and gastrocnemius veins when visible. The superficial great saphenous vein was also interrogated. Spectral Doppler was utilized to evaluate flow at rest and with distal augmentation maneuvers in the common femoral, femoral and popliteal veins. COMPARISON:  None Available. FINDINGS: Contralateral Common Femoral Vein: Respiratory phasicity is normal and  symmetric with the symptomatic side. No evidence of thrombus. Normal compressibility. Common Femoral Vein: No evidence of thrombus. Normal compressibility, respiratory phasicity and response to augmentation. Saphenofemoral Junction: No evidence of thrombus. Normal compressibility and flow on color Doppler imaging. Profunda Femoral Vein: No evidence of thrombus. Normal compressibility and flow on color Doppler imaging. Femoral Vein: No evidence of thrombus. Normal compressibility, respiratory phasicity and response to augmentation. Popliteal Vein: No evidence of thrombus. Normal compressibility, respiratory phasicity and response to augmentation. Calf Veins: No evidence of thrombus. Normal compressibility and flow on color Doppler imaging. Superficial Great Saphenous Vein: No evidence of thrombus. Normal compressibility. Venous Reflux:  None. Other Findings:  None. IMPRESSION: No evidence of deep venous thrombosis within the LEFT lower extremity. Electronically Signed   By: Suzen Dials M.D.   On: 09/14/2023 18:31   DG Chest 2 View Result Date: 09/14/2023 CLINICAL DATA:  Left-sided chest pain EXAM: CHEST - 2 VIEW COMPARISON:  09/10/2023, 08/01/2021 FINDINGS: Elevation or eventration of left diaphragm with air-filled bowel beneath. No acute airspace disease, pleural effusion or pneumothorax. Stable enlarged cardiomediastinal silhouette. IMPRESSION: No active cardiopulmonary disease. Electronically Signed   By: Luke Bun M.D.   On: 09/14/2023 15:26       Assessment & Plan:  Routine general medical examination at a health care facility  Essential hypertension Assessment & Plan: Continue benazepril .  Also on felodipine . Follow pressures.  Follow metabolic panel. No change in medication.   Orders: -     Basic metabolic panel with GFR; Future -     Basic metabolic panel with GFR; Future  Hyperlipidemia, unspecified hyperlipidemia type -  Lipid panel; Future -     Hepatic function panel;  Future  Hyperglycemia Assessment & Plan: Low carb diet and exercise. Follow met b and A1c.   Orders: -     Hemoglobin A1c; Future  Acute pain of left shoulder Assessment & Plan: Left posterior shoulder pain as outlined. Persistent. Reproducible on exam. appears to be c/w MSK pain.  Refer to PT.   Orders: -     Ambulatory referral to Physical Therapy  Aortic atherosclerosis (HCC) Assessment & Plan: Continue crestor .    Persistent atrial fibrillation [facility]) Assessment & Plan: S/p cardioversion.  Appears to be in SR today.  Continues on eliquis  and amiodarone .  Followed by cardiology. Stable.    Health care maintenance Assessment & Plan: Physical today 10/14/23.   She had a diagnostic mammogram on 06/01/2022. There showed expected postoperative changes without any concerning area of malignancy. Mammogram 06/04/23 - Birads I.    Mixed hyperlipidemia Assessment & Plan: Continue crestor . Low cholesterol diet and exercise. Follow lipid panel.    Narrow complex tachycardia (HCC) Assessment & Plan: Has seen cardiology. Stable.    Toe pain, right Assessment & Plan: Pain right third toe. Injury this am. Bruised. Plans to d/w podiatry today.    Other orders -     Rosuvastatin  Calcium ; Take 1 tablet (5 mg total) by mouth daily.  Dispense: 90 tablet; Refill: 3     Allena Hamilton, MD

## 2023-10-17 ENCOUNTER — Encounter: Payer: Self-pay | Admitting: Internal Medicine

## 2023-10-17 DIAGNOSIS — M79674 Pain in right toe(s): Secondary | ICD-10-CM | POA: Insufficient documentation

## 2023-10-17 NOTE — Assessment & Plan Note (Signed)
 Continue crestor

## 2023-10-17 NOTE — Assessment & Plan Note (Signed)
 S/p cardioversion.  Appears to be in SR today.  Continues on eliquis  and amiodarone .  Followed by cardiology. Stable.

## 2023-10-17 NOTE — Assessment & Plan Note (Signed)
Continue crestor.  Low cholesterol diet and exercise.  Follow lipid panel.  

## 2023-10-17 NOTE — Assessment & Plan Note (Signed)
 Low-carb diet and exercise.  Follow met b and A1c.

## 2023-10-17 NOTE — Assessment & Plan Note (Signed)
 Physical today 10/14/23.   She had a diagnostic mammogram on 06/01/2022. There showed expected postoperative changes without any concerning area of malignancy. Mammogram 06/04/23 - Birads I.

## 2023-10-17 NOTE — Assessment & Plan Note (Signed)
 Pain right third toe. Injury this am. Bruised. Plans to d/w podiatry today.

## 2023-10-17 NOTE — Assessment & Plan Note (Signed)
 Left posterior shoulder pain as outlined. Persistent. Reproducible on exam. appears to be c/w MSK pain.  Refer to PT.

## 2023-10-17 NOTE — Assessment & Plan Note (Signed)
 Continue benazepril .  Also on felodipine . Follow pressures.  Follow metabolic panel. No change in medication.

## 2023-10-17 NOTE — Assessment & Plan Note (Signed)
 Has seen cardiology. Stable.

## 2023-10-20 ENCOUNTER — Ambulatory Visit: Attending: Internal Medicine | Admitting: Internal Medicine

## 2023-10-20 ENCOUNTER — Encounter: Payer: Self-pay | Admitting: Internal Medicine

## 2023-10-20 VITALS — BP 138/68 | HR 57 | Ht 59.0 in | Wt 124.4 lb

## 2023-10-20 DIAGNOSIS — I2510 Atherosclerotic heart disease of native coronary artery without angina pectoris: Secondary | ICD-10-CM | POA: Diagnosis not present

## 2023-10-20 DIAGNOSIS — I4819 Other persistent atrial fibrillation: Secondary | ICD-10-CM | POA: Diagnosis not present

## 2023-10-20 DIAGNOSIS — R0789 Other chest pain: Secondary | ICD-10-CM

## 2023-10-20 DIAGNOSIS — R0989 Other specified symptoms and signs involving the circulatory and respiratory systems: Secondary | ICD-10-CM

## 2023-10-20 DIAGNOSIS — R0609 Other forms of dyspnea: Secondary | ICD-10-CM

## 2023-10-20 DIAGNOSIS — R079 Chest pain, unspecified: Secondary | ICD-10-CM

## 2023-10-20 MED ORDER — BENAZEPRIL HCL 40 MG PO TABS: 40.0000 mg | ORAL_TABLET | Freq: Every day | ORAL | 3 refills | Status: AC

## 2023-10-20 MED ORDER — FELODIPINE ER 2.5 MG PO TB24: 2.5000 mg | ORAL_TABLET | Freq: Every evening | ORAL | 3 refills | Status: AC

## 2023-10-20 MED ORDER — HYDROCHLOROTHIAZIDE 12.5 MG PO CAPS: 12.5000 mg | ORAL_CAPSULE | Freq: Every morning | ORAL | 3 refills | Status: AC

## 2023-10-20 NOTE — Progress Notes (Unsigned)
 Cardiology Office Note:  .   Date:  10/21/2023  ID:  Erin [name], DOB [birth year], MRN [id number] PCP: Erin Shad, MD  [facility] Providers Cardiologist:  Lonni Hanson, MD     History of Present Illness: .   Erin [name] is a 89 y.o. female with history of persistent atrial fibrillation, hypertension, hyperlipidemia, and breast cancer, who presents for evaluation of chest pain.  I last saw her in late June, at which time she complained of more fatigue than her baseline, as well as exertional dyspnea and sporadic left upper chest pain when lying in bed.  She also reported sporadic spikes in her blood pressure of up to 200 mmHg.  We agreed to obtain a chest radiograph, CBC, and TSH given long-term amiodarone  therapy and also arrange for an echocardiogram.  Echo showed normal LVEF with aortic sclerosis without stenosis and CVP of 8 mmHg.  Chest x-ray was without findings of acute cardiopulmonary disease.  She presented to the [facility] emergency department a few days later with chest pain.  CTA of the chest was negative for PE but showed cardiomegaly and contrast reflux into the hepatic veins and IVC suggesting right heart dysfunction (RV size and function normal on subsequent echocardiogram).  Today, Erin [name] reports that she is overall feeling better though she is still concerned that something is not right with her heart.  She has more fatigue than what she is accustomed to and continues to have sporadic pain in the left upper chest and back as well as some exertional dyspnea.  She denies shortness of breath at rest, orthopnea, palpitations, and lightheadedness.  She has also noticed occasional blurring in her vision and wonders if this could be related to full loaded pain.  She still has some leg swelling as well.  She notes that her blood pressure has been fairly well-controlled at home though she still has sporadic spikes up to 170 mmHg systolic.  She is currently using furosemide  2-3  times a week.  ROS: See HPI  Studies Reviewed: SABRA   EKG Interpretation Date/Time:  Wednesday October 20 2023 14:13:44 EDT Ventricular Rate:  57 PR Interval:  186 QRS Duration:  66 QT Interval:  440 QTC Calculation: 428 R Axis:   6  Text Interpretation: Sinus bradycardia with Premature atrial complexes Nonspecific ST and T wave abnormality Abnormal ECG When compared with ECG of 14-Sep-2023 14:52, Premature atrial complexes are now Present Confirmed by Mora Pedraza [phone]) on 10/21/2023 1:50:31 PM    TTE (09/30/2023): Normal LV size and wall thickness.  LVEF 60-65% with normal wall motion.  Indeterminate diastolic parameters.  Normal RV size and function.  Moderate left atrial enlargement.  No pericardial effusion.  Mild mitral annular calcification without regurgitation or stenosis.  Mild tricuspid regurgitation.  Aortic sclerosis without stenosis.  CVP 8 mmHg.  Risk Assessment/Calculations:    CHA2DS2-VASc Score = 4   This indicates a 4.8% annual risk of stroke. The patient's score is based upon: CHF History: 0 HTN History: 1 Diabetes History: 0 Stroke History: 0 Vascular Disease History: 0 Age Score: 2 Gender Score: 1            Physical Exam:   VS:  BP 138/68   Pulse (!) 57   Ht 4' 11 (1.499 m)   Wt 124 lb 6.4 oz (56.4 kg)   PF 95 L/min   BMI 25.13 kg/m    Wt Readings from Last 3 Encounters:  10/20/23 124 lb 6.4 oz (  56.4 kg)  10/14/23 123 lb 9.6 oz (56.1 kg)  09/14/23 123 lb (55.8 kg)    General:  NAD. Neck: No JVD or HJR. Lungs: Clear to auscultation bilaterally without wheezes or crackles. Heart: Regular rate and rhythm without murmurs, rubs, or gallops. Abdomen: Soft, nontender, nondistended. Extremities: No lower extremity edema.  ASSESSMENT AND PLAN: .    Atypical chest pain, dyspnea on exertion, and coronary artery calcification: Ms. Brunker continues to have intermittent chest discomfort as well as fatigue with exertional dyspnea.  While recent ED  evaluation was reassuring with negative troponin and stable EKG, her CTA of the chest redemonstrated coronary artery calcification.  There was also concern for cardiomegaly and RV dysfunction, though subsequent echocardiogram showed normal biventricular function.  Of note, she has an aberrant left subclavian vein, draining into the coronary sinus.  We have discussed continued conservative management versus further evaluation with invasive and noninvasive ischemia testing.  We will move forward with myocardial PET/CT.  Persistent atrial fibrillation: Ms. Sand is maintaining sinus rhythm today.  Continue apixaban  and low-dose amiodarone .  Hypertension: Blood pressure borderline elevated today and still higher at times at home.  This client is concerned that Fortipine could be causing vision changes and leg swelling.  We have agreed to reduce this to 2.5 mg nightly.  She is also taking benazepril  40 mg twice daily, which is above the maximum recommended dose.  I have asked her to reduce this to 40 mg daily.  We will add HCTZ 12.5 mg daily; she can continue to use as needed furosemide  for breakthrough edema.  She is already scheduled for labs with Dr. Glendia later this month, at which time her renal function and electrolytes will be reassessed.   Informed Consent   Shared Decision Making/Informed Consent The risks [chest pain, shortness of breath, cardiac arrhythmias, dizziness, blood pressure fluctuations, myocardial infarction, stroke/transient ischemic attack, nausea, vomiting, allergic reaction, radiation exposure, metallic taste sensation and life-threatening complications (estimated to be 1 in 10,000)], benefits (risk stratification, diagnosing coronary artery disease, treatment guidance) and alternatives of a cardiac PET stress test were discussed in detail with Erin [name] and she agrees to proceed.     Dispo: Return to clinic in 1 month.  Signed, Lonni Hanson, MD

## 2023-10-20 NOTE — Patient Instructions (Signed)
 Medication Instructions:  Your physician recommends the following medication changes.  START TAKING: Hydrochlorothiazide  12.5 mg by mouth every morning   DECREASE: Benazepril  to 40 mg by mouth once daily Felodipine  to 2.5 mg by mouth every evening   *If you need a refill on your cardiac medications before your next appointment, please call your pharmacy*  Lab Work: No labs ordered today    Testing/Procedures:    Please report to Radiology at the [facility] Main Entrance 30 minutes early for your test.  [address] [place], [place] [address]                         OR   Please report to Radiology at [facility] Main Entrance, medical mall, 30 mins prior to your test.  [address]  [place], [place]  How to Prepare for Your Cardiac PET/CT Stress Test:  Nothing to eat or drink, except water, 3 hours prior to arrival time.  NO caffeine/decaffeinated products, or chocolate 12 hours prior to arrival. (Please note decaffeinated beverages (teas/coffees) still contain caffeine).  If you have caffeine within 12 hours prior, the test will need to be rescheduled.  Medication instructions: Do not take erectile dysfunction medications for 72 hours prior to test (sildenafil, tadalafil) Do not take nitrates (isosorbide mononitrate, Ranexa) the day before or day of test Do not take tamsulosin the day before or morning of test Hold theophylline containing medications for 12 hours. Hold Dipyridamole 48 hours prior to the test.  Diabetic Preparation: If able to eat breakfast prior to 3 hour fasting, you may take all medications, including your insulin. Do not worry if you miss your breakfast dose of insulin - start at your next meal. If you do not eat prior to 3 hour fast-Hold all diabetes (oral and insulin) medications. Patients who wear a continuous glucose monitor MUST remove the device prior to scanning.  You may take your remaining  medications with water.  NO perfume, cologne or lotion on chest or abdomen area. FEMALES - Please avoid wearing dresses to this appointment.  Total time is 1 to 2 hours; you may want to bring reading material for the waiting time.  IF YOU THINK YOU MAY BE PREGNANT, OR ARE NURSING PLEASE INFORM THE TECHNOLOGIST.  In preparation for your appointment, medication and supplies will be purchased.  Appointment availability is limited, so if you need to cancel or reschedule, please call the Radiology Department Scheduler at [phone] 24 hours in advance to avoid a cancellation fee of $100.00  What to Expect When you Arrive:  Once you arrive and check in for your appointment, you will be taken to a preparation room within the Radiology Department.  A technologist or Nurse will obtain your medical history, verify that you are correctly prepped for the exam, and explain the procedure.  Afterwards, an IV will be started in your arm and electrodes will be placed on your skin for EKG monitoring during the stress portion of the exam. Then you will be escorted to the PET/CT scanner.  There, staff will get you positioned on the scanner and obtain a blood pressure and EKG.  During the exam, you will continue to be connected to the EKG and blood pressure machines.  A small, safe amount of a radioactive tracer will be injected in your IV to obtain a series of pictures of your heart along with an injection of a stress agent.  After your Exam:  It is recommended that you eat a meal and drink a caffeinated beverage to counter act any effects of the stress agent.  Drink plenty of fluids for the remainder of the day and urinate frequently for the first couple of hours after the exam.  Your doctor will inform you of your test results within 7-10 business days.  For more information and frequently asked questions, please visit our website: [email or web address]  For questions about your test or how to  prepare for your test, please call: Cardiac Imaging Nurse Navigators Office: [phone]   Follow-Up: At [facility], you and your health needs are our priority.  As part of our continuing mission to provide you with exceptional heart care, our providers are all part of one team.  This team includes your primary Cardiologist (physician) and Advanced Practice Providers or APPs (Physician Assistants and Nurse Practitioners) who all work together to provide you with the care you need, when you need it.  Your next appointment:   1 month(s)  Provider:   You may see Lonni Hanson, MD or one of the following Advanced Practice Providers on your designated Care Team:   Lonni Meager, NP Lesley Maffucci, PA-C Bernardino Bring, PA-C Cadence [place], PA-C Tylene Lunch, NP Barnie Hila, NP

## 2023-10-25 ENCOUNTER — Telehealth: Payer: Self-pay

## 2023-10-25 NOTE — Telephone Encounter (Signed)
 Copied from CRM [id number]. Topic: General - Other >> Oct 25, 2023  1:54 PM Mia F wrote: Reason for CRM: Pt is asking for the nurse to give her a call. She would not elaborate any further. Just mentioned it is something the doctor asked her to do. Please advise  [phone]

## 2023-10-26 NOTE — Telephone Encounter (Signed)
 Error

## 2023-10-26 NOTE — Telephone Encounter (Signed)
 Patient called to report that she does not feel that she needs PT anymore. She is doing much better than she was a week ago. Will leave PT referral placed in case she changes her mind.

## 2023-10-26 NOTE — Telephone Encounter (Signed)
 LM for patient to return call.

## 2023-11-01 DIAGNOSIS — H26493 Other secondary cataract, bilateral: Secondary | ICD-10-CM | POA: Diagnosis not present

## 2023-11-01 DIAGNOSIS — Z961 Presence of intraocular lens: Secondary | ICD-10-CM | POA: Diagnosis not present

## 2023-11-10 ENCOUNTER — Encounter (HOSPITAL_COMMUNITY): Payer: Self-pay

## 2023-11-11 ENCOUNTER — Other Ambulatory Visit (INDEPENDENT_AMBULATORY_CARE_PROVIDER_SITE_OTHER)

## 2023-11-11 ENCOUNTER — Ambulatory Visit
Admission: RE | Admit: 2023-11-11 | Discharge: 2023-11-11 | Disposition: A | Discharge status: Home / Self Care | Source: Ambulatory Visit | Attending: Internal Medicine | Admitting: Internal Medicine

## 2023-11-11 DIAGNOSIS — I2510 Atherosclerotic heart disease of native coronary artery without angina pectoris: Secondary | ICD-10-CM | POA: Insufficient documentation

## 2023-11-11 DIAGNOSIS — R0789 Other chest pain: Secondary | ICD-10-CM | POA: Diagnosis not present

## 2023-11-11 DIAGNOSIS — I10 Essential (primary) hypertension: Secondary | ICD-10-CM

## 2023-11-11 LAB — BASIC METABOLIC PANEL WITH GFR
BUN: 21 mg/dL (ref 6–23)
CO2: 30 meq/L (ref 19–32)
Calcium: 9.8 mg/dL (ref 8.4–10.5)
Chloride: 101 meq/L (ref 96–112)
Creatinine, Ser: 0.99 mg/dL (ref 0.40–1.20)
GFR: 49.8 mL/min — ABNORMAL LOW (ref >60.00)
Glucose, Bld: 114 mg/dL — ABNORMAL HIGH (ref 70–99)
Potassium: 4.4 meq/L (ref 3.5–5.1)
Sodium: 138 meq/L (ref 135–145)

## 2023-11-11 MED ORDER — RUBIDIUM RB82 GENERATOR (RUBYFILL)
25.0000 | PACK | Freq: Once | INTRAVENOUS | Status: AC
  Administered 2023-11-11: 14.81 via INTRAVENOUS

## 2023-11-11 MED ORDER — REGADENOSON 0.4 MG/5ML IV SOLN
0.4000 mg | Freq: Once | INTRAVENOUS | Status: AC
  Administered 2023-11-11: 0.4 mg via INTRAVENOUS
  Filled 2023-11-11: qty 5

## 2023-11-11 MED ORDER — REGADENOSON 0.4 MG/5ML IV SOLN
INTRAVENOUS | Status: AC
  Filled 2023-11-11: qty 5

## 2023-11-11 MED ORDER — RUBIDIUM RB82 GENERATOR (RUBYFILL)
25.0000 | PACK | Freq: Once | INTRAVENOUS | Status: AC
  Administered 2023-11-11: 14.87 via INTRAVENOUS

## 2023-11-12 ENCOUNTER — Ambulatory Visit: Payer: Self-pay | Admitting: Internal Medicine

## 2023-11-12 LAB — NM PET CT CARDIAC PERFUSION MULTI W/ABSOLUTE BLOODFLOW
LV dias vol: 73 mL (ref 46–106)
LV sys vol: 20 mL (ref 3.8–5.2)
MBFR: 2.31
Nuc Rest EF: 73 %
Nuc Stress EF: 74 %
Peak HR: 63 {beats}/min
Rest HR: 45 {beats}/min
Rest MBF: 0.8 ml/g/min
Rest Nuclear Isotope Dose: 14.9 mCi
SRS: 2
SSS: 4
ST Depression (mm): 0 mm
Stress MBF: 1.85 ml/g/min
Stress Nuclear Isotope Dose: 14.8 mCi
TID: 0.87

## 2023-11-12 NOTE — Telephone Encounter (Signed)
 Patient returned RN's call regarding results.

## 2023-11-12 NOTE — Telephone Encounter (Signed)
 Left voicemail message to call back

## 2023-11-23 DIAGNOSIS — H26492 Other secondary cataract, left eye: Secondary | ICD-10-CM | POA: Diagnosis not present

## 2023-11-24 DIAGNOSIS — H903 Sensorineural hearing loss, bilateral: Secondary | ICD-10-CM | POA: Diagnosis not present

## 2023-12-02 ENCOUNTER — Encounter: Payer: Self-pay | Admitting: Internal Medicine

## 2023-12-02 ENCOUNTER — Ambulatory Visit: Attending: Internal Medicine | Admitting: Internal Medicine

## 2023-12-02 VITALS — BP 128/60 | HR 63 | Ht 59.0 in | Wt 122.4 lb

## 2023-12-02 DIAGNOSIS — I2510 Atherosclerotic heart disease of native coronary artery without angina pectoris: Secondary | ICD-10-CM | POA: Diagnosis not present

## 2023-12-02 DIAGNOSIS — I493 Ventricular premature depolarization: Secondary | ICD-10-CM

## 2023-12-02 DIAGNOSIS — I10 Essential (primary) hypertension: Secondary | ICD-10-CM | POA: Diagnosis not present

## 2023-12-02 DIAGNOSIS — R0609 Other forms of dyspnea: Secondary | ICD-10-CM | POA: Diagnosis not present

## 2023-12-02 DIAGNOSIS — I4819 Other persistent atrial fibrillation: Secondary | ICD-10-CM

## 2023-12-02 NOTE — Patient Instructions (Signed)

## 2023-12-02 NOTE — Progress Notes (Signed)
  Cardiology Office Note:  .   Date:  12/02/2023  ID:  Erin [name], DOB 1930/03/30, MRN [id number] PCP: Glendia Shad, MD  [facility] Providers Cardiologist:  Lonni Hanson, MD     History of Present Illness: .   Erin [name] is a 89 y.o. female with history of persistent atrial fibrillation, hypertension, hyperlipidemia, and breast cancer, who presents for follow-up of chest pain.  I last saw her in early August, at which time she was concerned about continued fatigue and sporadic left upper chest and back pain accompanied by exertional dyspnea.  We agreed to perform a myocardial PET/CT, which was low risk without evidence of ischemia or scar.  LVEF and myocardial blood flow reserve were both normal.  Today, Erin [name] reports that he feels back to normal without significant dyspnea.  Her chest/left should pain has resolved.  She denies palpitations, lightheadedness, and edema.  BP has been well-controlled at home.  ROS: See HPI  Studies Reviewed: SABRA   EKG Interpretation Date/Time:  Thursday December 02 2023 08:26:12 EDT Ventricular Rate:  63 PR Interval:  148 QRS Duration:  70 QT Interval:  420 QTC Calculation: 429 R Axis:   11  Text Interpretation: Sinus bradycardia with Premature atrial complexes Nonspecific ST and T wave abnormality Abnormal ECG When compared with ECG of 20-Oct-2023 14:13, No significant change was found Confirmed by Sunnie Odden, Lonni [phone]) on 12/02/2023 8:29:29 AM    Myocardial PET/CT (11/11/2023): Low risk study without evidence of ischemia or scar.  LVEF 74%.  Normal global myocardial blood flow reserve.  Coronary artery calcification present.  Risk Assessment/Calculations:    CHA2DS2-VASc Score = 4   This indicates a 4.8% annual risk of stroke. The patient's score is based upon: CHF History: 0 HTN History: 1 Diabetes History: 0 Stroke History: 0 Vascular Disease History: 0 Age Score: 2 Gender Score: 1            Physical Exam:   VS:   BP 128/60   Pulse 63   Ht 4' 11 (1.499 m)   Wt 122 lb 6 oz (55.5 kg)   SpO2 97%   BMI 24.72 kg/m    Wt Readings from Last 3 Encounters:  12/02/23 122 lb 6 oz (55.5 kg)  10/20/23 124 lb 6.4 oz (56.4 kg)  10/14/23 123 lb 9.6 oz (56.1 kg)    General:  NAD. Neck: No JVD or HJR. Lungs: Clear to auscultation bilaterally without wheezes or crackles. Heart: Regular rate and rhythm with 1/6 systolic murmur.  No rubs or gallops. Abdomen: Soft, nontender, nondistended. Extremities: No lower extremity edema.  ASSESSMENT AND PLAN: .    Coronary artery disease: Erin [name] reports resolution of her prior atypical chest pain and exertional dyspnea.  Interval myocardial PET/CT was without evidence of ischemia or scar, though coronary artery calcification was redemonstrated.  Defer additional testing at this time.  Continue rosuvastatin  5 mg daily.  Persistent atrial fibrillation: Erin [name] is maintaining sinus rhythm.  Continue apixaban  2.5 mg twice daily based on age and weight as well as amiodarone  100 mg daily.  Hypertension: Blood pressure well-controlled at home and on recheck today in the office.  Continue current regimen of benazepril , felodipine , and HCTZ.  Lower extremity edema: Minimal edema noted.  Continue HCTZ as well as as needed furosemide .    Dispo: Return to clinic in 6 months.  Signed, Lonni Hanson, MD

## 2023-12-09 ENCOUNTER — Telehealth: Payer: Self-pay

## 2023-12-09 NOTE — Telephone Encounter (Signed)
 Patient was just calling to let us  know that she is not going to come in prior to appt for labs

## 2023-12-09 NOTE — Telephone Encounter (Signed)
 Will do them same day

## 2023-12-09 NOTE — Telephone Encounter (Signed)
 Copied from CRM [id number]. Topic: Appointments - Appointment Cancel/Reschedule >> Dec 09, 2023 11:24 AM Anairis L wrote: Patient/patient representative is calling to cancel or reschedule an appointment. Refer to attachments for appointment information.

## 2023-12-09 NOTE — Telephone Encounter (Unsigned)
 Copied from CRM [id number]. Topic: Appointments - Appointment Cancel/Reschedule >> Dec 09, 2023 11:24 AM Anairis L wrote: Patient/patient representative is calling to cancel or reschedule an appointment. Refer to attachments for appointment information. >> Dec 09, 2023  2:29 PM Franky GRADE wrote: Patient returning a call from [place], Called cal and Sueanne was not avaliable at the moment.

## 2023-12-09 NOTE — Telephone Encounter (Signed)
 LMTCB

## 2023-12-13 ENCOUNTER — Other Ambulatory Visit

## 2023-12-14 DIAGNOSIS — M19011 Primary osteoarthritis, right shoulder: Secondary | ICD-10-CM | POA: Diagnosis not present

## 2023-12-14 DIAGNOSIS — M79601 Pain in right arm: Secondary | ICD-10-CM | POA: Diagnosis not present

## 2023-12-14 DIAGNOSIS — M47812 Spondylosis without myelopathy or radiculopathy, cervical region: Secondary | ICD-10-CM | POA: Diagnosis not present

## 2023-12-14 NOTE — Telephone Encounter (Signed)
 [facility] to be evaluated for right shouler pain. Advised that Dr Glendia is not in the office.

## 2023-12-14 NOTE — Telephone Encounter (Unsigned)
 Copied from CRM #[id number]. Topic: Clinical - Refused Triage >> Dec 14, 2023 11:11 AM Suzen RAMAN wrote: Patient/caller voiced complaints of right shoulder and arm pain. Requested a call back from [place]. Declined transfer to triage.

## 2023-12-20 ENCOUNTER — Ambulatory Visit (INDEPENDENT_AMBULATORY_CARE_PROVIDER_SITE_OTHER): Admitting: Internal Medicine

## 2023-12-20 VITALS — BP 130/72 | HR 81 | Resp 16 | Ht 59.0 in | Wt 120.6 lb

## 2023-12-20 DIAGNOSIS — R739 Hyperglycemia, unspecified: Secondary | ICD-10-CM | POA: Diagnosis not present

## 2023-12-20 DIAGNOSIS — I2510 Atherosclerotic heart disease of native coronary artery without angina pectoris: Secondary | ICD-10-CM

## 2023-12-20 DIAGNOSIS — M75101 Unspecified rotator cuff tear or rupture of right shoulder, not specified as traumatic: Secondary | ICD-10-CM | POA: Diagnosis not present

## 2023-12-20 DIAGNOSIS — F439 Reaction to severe stress, unspecified: Secondary | ICD-10-CM

## 2023-12-20 DIAGNOSIS — E782 Mixed hyperlipidemia: Secondary | ICD-10-CM | POA: Diagnosis not present

## 2023-12-20 DIAGNOSIS — I10 Essential (primary) hypertension: Secondary | ICD-10-CM

## 2023-12-20 DIAGNOSIS — C50412 Malignant neoplasm of upper-outer quadrant of left female breast: Secondary | ICD-10-CM | POA: Diagnosis not present

## 2023-12-20 DIAGNOSIS — I700 Atherosclerosis of aorta: Secondary | ICD-10-CM | POA: Diagnosis not present

## 2023-12-20 DIAGNOSIS — Z170 Estrogen receptor positive status [ER+]: Secondary | ICD-10-CM | POA: Diagnosis not present

## 2023-12-20 DIAGNOSIS — Z66 Do not resuscitate: Secondary | ICD-10-CM | POA: Diagnosis not present

## 2023-12-20 DIAGNOSIS — M25511 Pain in right shoulder: Secondary | ICD-10-CM | POA: Diagnosis not present

## 2023-12-20 DIAGNOSIS — I4819 Other persistent atrial fibrillation: Secondary | ICD-10-CM

## 2023-12-20 DIAGNOSIS — M79601 Pain in right arm: Secondary | ICD-10-CM | POA: Diagnosis not present

## 2023-12-20 NOTE — Progress Notes (Signed)
 Subjective:    Patient ID: Erin [name], female    DOB: June 12, 1930, 89 y.o.   MRN: [id number]  Patient here for  Chief Complaint  Patient presents with   Medical Management of Chronic Issues    HPI Here for a scheduled follow up - follow up regarding hypertension, hypercholesterolemia, afib and hyperglycemia. Evaluated in ED 09/14/23 - chest pain. Also reported some DOE and leg swelling. Ultrasound - negative for DVT. CTA - negative for PE or other acute finding. Back pain improved with lidoderm  patch. Prior to ED visit,saw cardiology with increased fatigue and exertional dyspnea and leg swelling. CBC and TSH wnl. CXR ok. ECHO - EF 60-65% with aortic valve sclerosis. Saw cardiology 10/2023 - f/u fatigue and intermittent chest pain. Is s/p myocardial PET/CT - low risk without evidence of ischemia or scar. LVEF and myocardial blood flow reserve were both normal. Had cardiology f/u 12/02/23 - chest pain and shoulder pain - resolved. Recommended to continue eliquis , rosuvastatin  and amiodarone . Left shoulder pain and chest pain resolved. Now with right shoulder pain and limited rom. Unable to lift arm/abduct arm to 90 degrees. No injury or trauma. Bruising - right upper arm. Saw ortho. Scheduled for MRI today with f/u with ortho today. No abdominal pain. Bowels moving.    Past Medical History:  Diagnosis Date   A-fib [facility])    Breast cancer (HCC)    Cancer (HCC)    melanoma - arm left   Chest pain 07/13/2016   History of colon polyps    Hypercholesterolemia    Hypertension    Personal history of radiation therapy    Past Surgical History:  Procedure Laterality Date   APPENDECTOMY  1946   BREAST BIOPSY Left 06/04/2020   us  bx, vision marker, positive   BREAST LUMPECTOMY Left 06/24/2020   BREAST LUMPECTOMY WITH SENTINEL LYMPH NODE BIOPSY Left 06/24/2020   Procedure: BREAST LUMPECTOMY WITH SENTINEL LYMPH NODE BIOPSY;  Surgeon: Dessa Reyes ORN, MD;  Location: [facility] ORS;  Service: General;   Laterality: Left;   CARDIOVERSION N/A 07/30/2020   Procedure: CARDIOVERSION;  Surgeon: Mady Bruckner, MD;  Location: [facility] ORS;  Service: Cardiovascular;  Laterality: N/A;   CARDIOVERSION N/A 09/03/2020   Procedure: CARDIOVERSION;  Surgeon: Mady Bruckner, MD;  Location: [facility] ORS;  Service: Cardiovascular;  Laterality: N/A;   CATARACT EXTRACTION Bilateral    DILATION AND CURETTAGE OF UTERUS     LAMINECTOMY  2000   release of foraminal stenosis  2003   Family History  Problem Relation Age of Onset   Hypertension Mother    Hypertension Sister    Multiple sclerosis Sister        died 33   Heart disease Brother        S/P CABG   Breast cancer Maternal Aunt    Social History   Socioeconomic History   Marital status: Widowed    Spouse name: Not on file   Number of children: 4   Years of education: Not on file   Highest education level: Not on file  Occupational History   Occupation: retired  Tobacco Use   Smoking status: Former   Smokeless tobacco: Never  Vaping Use   Vaping status: Never Used  Substance and Sexual Activity   Alcohol use: Yes    Alcohol/week: 0.0 standard drinks of alcohol    Comment: occassionally   Drug use: No   Sexual activity: Never  Other Topics Concern   Not on file  Social History Narrative  Not on file   Social Drivers of Health   Financial Resource Strain: Low Risk  (05/11/2023)   Overall Financial Resource Strain (CARDIA)    Difficulty of Paying Living Expenses: Not hard at all  Food Insecurity: No Food Insecurity (05/11/2023)   Hunger Vital Sign    Worried About Running Out of Food in the Last Year: Never true    Ran Out of Food in the Last Year: Never true  Transportation Needs: No Transportation Needs (05/11/2023)   PRAPARE - Administrator, Civil Service (Medical): No    Lack of Transportation (Non-Medical): No  Physical Activity: Sufficiently Active (05/11/2023)   Exercise Vital Sign    Days of Exercise per Week: 6  days    Minutes of Exercise per Session: 40 min  Stress: No Stress Concern Present (05/11/2023)   [employer] of Occupational Health - Occupational Stress Questionnaire    Feeling of Stress : Not at all  Social Connections: Moderately Integrated (05/11/2023)   Social Connection and Isolation Panel    Frequency of Communication with Friends and Family: More than three times a week    Frequency of Social Gatherings with Friends and Family: More than three times a week    Attends Religious Services: More than 4 times per year    Active Member of [employer] or Organizations: Yes    Attends Banker Meetings: More than 4 times per year    Marital Status: Widowed     Review of Systems  Constitutional:  Negative for appetite change and unexpected weight change.  HENT:  Negative for congestion and sinus pressure.   Respiratory:  Negative for cough, chest tightness and shortness of breath.   Cardiovascular:  Negative for chest pain, palpitations and leg swelling.  Gastrointestinal:  Negative for abdominal pain, diarrhea, nausea and vomiting.  Genitourinary:  Negative for difficulty urinating and dysuria.  Musculoskeletal:        Right shoulder/upper arm pain as outlined. Limited rom.   Skin:  Negative for color change and rash.       Bruising - right upper arm as outlined.   Neurological:  Negative for dizziness and headaches.  Psychiatric/Behavioral:  Negative for agitation and dysphoric mood.        Objective:     BP 130/72   Pulse 81   Resp 16   Ht 4' 11 (1.499 m)   Wt 120 lb 9.6 oz (54.7 kg)   SpO2 98%   BMI 24.36 kg/m  Wt Readings from Last 3 Encounters:  12/20/23 120 lb 9.6 oz (54.7 kg)  12/02/23 122 lb 6 oz (55.5 kg)  10/20/23 124 lb 6.4 oz (56.4 kg)    Physical Exam Vitals reviewed.  Constitutional:      General: She is not in acute distress.    Appearance: Normal appearance.  HENT:     Head: Normocephalic and atraumatic.     Right Ear: External ear  normal.     Left Ear: External ear normal.  Eyes:     General: No scleral icterus.       Right eye: No discharge.        Left eye: No discharge.     Conjunctiva/sclera: Conjunctivae normal.  Neck:     Thyroid : No thyromegaly.  Cardiovascular:     Rate and Rhythm: Normal rate and regular rhythm.  Pulmonary:     Effort: No respiratory distress.     Breath sounds: Normal breath sounds. No wheezing.  Abdominal:     General: Bowel sounds are normal.     Palpations: Abdomen is soft.     Tenderness: There is no abdominal tenderness.  Musculoskeletal:        General: No swelling.     Cervical back: Neck supple. No tenderness.     Comments: Increased pain with attempts - lifting arm and with attempting to abduct right arm. Increased bruising - right upper arm. No pain to palpation.   Lymphadenopathy:     Cervical: No cervical adenopathy.  Skin:    Findings: No erythema or rash.  Neurological:     Mental Status: She is alert.  Psychiatric:        Mood and Affect: Mood normal.        Behavior: Behavior normal.         Outpatient Encounter Medications as of 12/20/2023  Medication Sig   acetaminophen  (TYLENOL ) 325 MG tablet Take 650 mg by mouth every 6 (six) hours as needed for moderate pain or headache.   amiodarone  (PACERONE ) 200 MG tablet Take 0.5 tablets (100 mg total) by mouth daily.   apixaban  (ELIQUIS ) 2.5 MG TABS tablet Take 1 tablet (2.5 mg total) by mouth 2 (two) times daily.   benazepril  (LOTENSIN ) 40 MG tablet Take 1 tablet (40 mg total) by mouth daily.   Calcium  Carbonate-Vitamin D 600-400 MG-UNIT tablet Take 1 tablet by mouth in the morning.   felodipine  (PLENDIL ) 2.5 MG 24 hr tablet Take 1 tablet (2.5 mg total) by mouth every evening.   furosemide  (LASIX ) 20 MG tablet Take 1 tablet (20 mg total) by mouth daily as needed.   hydrochlorothiazide  (MICROZIDE ) 12.5 MG capsule Take 1 capsule (12.5 mg total) by mouth every morning.   Multiple Vitamin (MULTIVITAMIN WITH  MINERALS) TABS tablet Take 1 tablet by mouth in the morning.   rosuvastatin  (CRESTOR ) 5 MG tablet Take 1 tablet (5 mg total) by mouth daily.   No facility-administered encounter medications on file as of 12/20/2023.     Lab Results  Component Value Date   WBC 5.5 09/14/2023   HGB 14.1 09/14/2023   HCT 42.0 09/14/2023   PLT 213 09/14/2023   GLUCOSE 114 (H) 11/11/2023   CHOL 163 10/11/2023   TRIG 85.0 10/11/2023   HDL 80.30 10/11/2023   LDLCALC 65 10/11/2023   ALT 20 10/11/2023   AST 18 10/11/2023   NA 138 11/11/2023   K 4.4 11/11/2023   CL 101 11/11/2023   CREATININE 0.99 11/11/2023   BUN 21 11/11/2023   CO2 30 11/11/2023   TSH 0.735 09/10/2023   HGBA1C 5.7 10/11/2023    NM PET CT CARDIAC PERFUSION MULTI W/ABSOLUTE BLOODFLOW Result Date: 11/12/2023   LV perfusion is normal. There is no evidence of ischemia. There is no evidence of infarction.   Rest left ventricular function is normal. Rest EF: 73%. Stress left ventricular function is normal. Stress EF: 74%. End diastolic cavity size is normal. End systolic cavity size is normal.   Myocardial blood flow was computed to be 0.108ml/g/min at rest and 1.42ml/g/min at stress. Global myocardial blood flow reserve was 2.31 and was normal.   Coronary calcium  was present on the attenuation correction CT images. Moderate coronary calcifications were present. Coronary calcifications were present in the left anterior descending artery, left circumflex artery and right coronary artery distribution(s).   The study is normal. The study is low risk. CLINICAL DATA:  This over-read does not include interpretation of cardiac or coronary anatomy or pathology.  The Cardiac PET interpretation by the cardiologist is attached. COMPARISON:  None Available. FINDINGS: Limited view of the lung parenchyma demonstrates no suspicious nodularity. Airways are normal. Limited view of the mediastinum demonstrates no adenopathy. Esophagus normal. Limited view of the upper  abdomen unremarkable. Limited view of the skeleton and chest wall is unremarkable. IMPRESSION: No significant extracardiac findings. Electronically Signed   By: Jackquline Boxer M.D.   On: 11/11/2023 16:56      Assessment & Plan:  Aortic atherosclerosis Assessment & Plan: Continue crestor .    Persistent atrial fibrillation [facility]) Assessment & Plan: S/p cardioversion.  Appears to be in SR today.  Continues on eliquis  and amiodarone .  Followed by cardiology. Stable.    Coronary artery disease involving native coronary artery of native heart without angina pectoris Assessment & Plan:  Saw cardiology 10/2023 - f/u fatigue and intermittent chest pain. Is s/p myocardial PET/CT - low risk without evidence of ischemia or scar. LVEF and myocardial blood flow reserve were both normal. Had cardiology f/u 12/02/23 - chest pain and shoulder pain - resolved. Recommended to continue eliquis , rosuvastatin  and amiodarone .    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 S/p lumpectomy. S/p radiation. Holding on antiestrogen treatment. Continues f/u with Dr Rennie. Continue surveillance.    Essential hypertension Assessment & Plan: Continue benazepril .  Also on felodipine . Follow pressures.  Follow metabolic panel. Reviewed outside blood pressures - most averaging 120-130s/50-0s. No changes in medication today.    Hyperglycemia Assessment & Plan: Low carb diet and exercise. Follow met b and A1c.    Mixed hyperlipidemia Assessment & Plan: Continue crestor . Follow lipid panel. Continues to exercise.    Right shoulder pain, unspecified chronicity Assessment & Plan: With right shoulder pain now.  Unable to lift arm/abduct arm to 90 degrees. No injury or trauma. Bruising - right upper arm. Saw ortho. Scheduled for MRI today with f/u with ortho today.     Stress Assessment & Plan: Overall appears to be handling things relatively well. Follow.    DNR (do not resuscitate) Assessment & Plan: Request DNR form to be completed. Discussed. Form completed.       Allena Hamilton, MD

## 2023-12-26 ENCOUNTER — Encounter: Payer: Self-pay | Admitting: Internal Medicine

## 2023-12-26 DIAGNOSIS — Z66 Do not resuscitate: Secondary | ICD-10-CM | POA: Insufficient documentation

## 2023-12-26 NOTE — Assessment & Plan Note (Signed)
 S/p cardioversion.  Appears to be in SR today.  Continues on eliquis  and amiodarone .  Followed by cardiology. Stable.

## 2023-12-26 NOTE — Assessment & Plan Note (Signed)
 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 S/p lumpectomy. S/p radiation. Holding on antiestrogen treatment. Continues f/u with Dr Donneta Romberg. Continue surveillance.

## 2023-12-26 NOTE — Assessment & Plan Note (Signed)
 Low-carb diet and exercise.  Follow met b and A1c.

## 2023-12-26 NOTE — Assessment & Plan Note (Signed)
 Continue benazepril .  Also on felodipine . Follow pressures.  Follow metabolic panel. Reviewed outside blood pressures - most averaging 120-130s/50-0s. No changes in medication today.

## 2023-12-26 NOTE — Assessment & Plan Note (Signed)
 Request DNR form to be completed. Discussed. Form completed.

## 2023-12-26 NOTE — Assessment & Plan Note (Signed)
 Continue crestor

## 2023-12-26 NOTE — Assessment & Plan Note (Signed)
 With right shoulder pain now.  Unable to lift arm/abduct arm to 90 degrees. No injury or trauma. Bruising - right upper arm. Saw ortho. Scheduled for MRI today with f/u with ortho today.

## 2023-12-26 NOTE — Assessment & Plan Note (Signed)
 Saw cardiology 10/2023 - f/u fatigue and intermittent chest pain. Is s/p myocardial PET/CT - low risk without evidence of ischemia or scar. LVEF and myocardial blood flow reserve were both normal. Had cardiology f/u 12/02/23 - chest pain and shoulder pain - resolved. Recommended to continue eliquis , rosuvastatin  and amiodarone .

## 2023-12-26 NOTE — Assessment & Plan Note (Signed)
 Continue crestor . Follow lipid panel. Continues to exercise.

## 2023-12-26 NOTE — Assessment & Plan Note (Signed)
Overall appears to be handling things relatively well.  Follow.   

## 2024-01-03 DIAGNOSIS — G8929 Other chronic pain: Secondary | ICD-10-CM | POA: Diagnosis not present

## 2024-01-03 DIAGNOSIS — M25511 Pain in right shoulder: Secondary | ICD-10-CM | POA: Diagnosis not present

## 2024-01-06 DIAGNOSIS — M25611 Stiffness of right shoulder, not elsewhere classified: Secondary | ICD-10-CM | POA: Diagnosis not present

## 2024-01-06 DIAGNOSIS — S41111A Laceration without foreign body of right upper arm, initial encounter: Secondary | ICD-10-CM | POA: Diagnosis not present

## 2024-01-06 DIAGNOSIS — G8929 Other chronic pain: Secondary | ICD-10-CM | POA: Diagnosis not present

## 2024-01-06 DIAGNOSIS — M25511 Pain in right shoulder: Secondary | ICD-10-CM | POA: Diagnosis not present

## 2024-01-06 DIAGNOSIS — M6281 Muscle weakness (generalized): Secondary | ICD-10-CM | POA: Diagnosis not present

## 2024-01-10 DIAGNOSIS — M25611 Stiffness of right shoulder, not elsewhere classified: Secondary | ICD-10-CM | POA: Diagnosis not present

## 2024-01-10 DIAGNOSIS — M25511 Pain in right shoulder: Secondary | ICD-10-CM | POA: Diagnosis not present

## 2024-01-10 DIAGNOSIS — M6281 Muscle weakness (generalized): Secondary | ICD-10-CM | POA: Diagnosis not present

## 2024-01-10 DIAGNOSIS — G8929 Other chronic pain: Secondary | ICD-10-CM | POA: Diagnosis not present

## 2024-01-11 ENCOUNTER — Ambulatory Visit

## 2024-01-13 ENCOUNTER — Ambulatory Visit

## 2024-01-13 DIAGNOSIS — M25511 Pain in right shoulder: Secondary | ICD-10-CM | POA: Diagnosis not present

## 2024-01-13 DIAGNOSIS — G8929 Other chronic pain: Secondary | ICD-10-CM | POA: Diagnosis not present

## 2024-01-17 DIAGNOSIS — M6281 Muscle weakness (generalized): Secondary | ICD-10-CM | POA: Diagnosis not present

## 2024-01-17 DIAGNOSIS — M25611 Stiffness of right shoulder, not elsewhere classified: Secondary | ICD-10-CM | POA: Diagnosis not present

## 2024-01-17 DIAGNOSIS — M25511 Pain in right shoulder: Secondary | ICD-10-CM | POA: Diagnosis not present

## 2024-01-17 DIAGNOSIS — G8929 Other chronic pain: Secondary | ICD-10-CM | POA: Diagnosis not present

## 2024-01-18 ENCOUNTER — Encounter

## 2024-01-20 ENCOUNTER — Encounter

## 2024-01-20 DIAGNOSIS — M6281 Muscle weakness (generalized): Secondary | ICD-10-CM | POA: Diagnosis not present

## 2024-01-20 DIAGNOSIS — M25511 Pain in right shoulder: Secondary | ICD-10-CM | POA: Diagnosis not present

## 2024-01-20 DIAGNOSIS — M25611 Stiffness of right shoulder, not elsewhere classified: Secondary | ICD-10-CM | POA: Diagnosis not present

## 2024-01-20 DIAGNOSIS — G8929 Other chronic pain: Secondary | ICD-10-CM | POA: Diagnosis not present

## 2024-01-24 DIAGNOSIS — M25511 Pain in right shoulder: Secondary | ICD-10-CM | POA: Diagnosis not present

## 2024-01-24 DIAGNOSIS — M25611 Stiffness of right shoulder, not elsewhere classified: Secondary | ICD-10-CM | POA: Diagnosis not present

## 2024-01-24 DIAGNOSIS — M6281 Muscle weakness (generalized): Secondary | ICD-10-CM | POA: Diagnosis not present

## 2024-01-24 DIAGNOSIS — G8929 Other chronic pain: Secondary | ICD-10-CM | POA: Diagnosis not present

## 2024-01-25 ENCOUNTER — Encounter

## 2024-01-27 ENCOUNTER — Encounter

## 2024-01-27 DIAGNOSIS — M25611 Stiffness of right shoulder, not elsewhere classified: Secondary | ICD-10-CM | POA: Diagnosis not present

## 2024-01-27 DIAGNOSIS — M6281 Muscle weakness (generalized): Secondary | ICD-10-CM | POA: Diagnosis not present

## 2024-01-27 DIAGNOSIS — G8929 Other chronic pain: Secondary | ICD-10-CM | POA: Diagnosis not present

## 2024-01-27 DIAGNOSIS — M25511 Pain in right shoulder: Secondary | ICD-10-CM | POA: Diagnosis not present

## 2024-01-31 DIAGNOSIS — M25611 Stiffness of right shoulder, not elsewhere classified: Secondary | ICD-10-CM | POA: Diagnosis not present

## 2024-01-31 DIAGNOSIS — M25511 Pain in right shoulder: Secondary | ICD-10-CM | POA: Diagnosis not present

## 2024-01-31 DIAGNOSIS — G8929 Other chronic pain: Secondary | ICD-10-CM | POA: Diagnosis not present

## 2024-01-31 DIAGNOSIS — M6281 Muscle weakness (generalized): Secondary | ICD-10-CM | POA: Diagnosis not present

## 2024-02-01 ENCOUNTER — Encounter

## 2024-02-01 DIAGNOSIS — M75101 Unspecified rotator cuff tear or rupture of right shoulder, not specified as traumatic: Secondary | ICD-10-CM | POA: Diagnosis not present

## 2024-02-03 ENCOUNTER — Encounter

## 2024-02-03 DIAGNOSIS — M25611 Stiffness of right shoulder, not elsewhere classified: Secondary | ICD-10-CM | POA: Diagnosis not present

## 2024-02-03 DIAGNOSIS — M25511 Pain in right shoulder: Secondary | ICD-10-CM | POA: Diagnosis not present

## 2024-02-03 DIAGNOSIS — G8929 Other chronic pain: Secondary | ICD-10-CM | POA: Diagnosis not present

## 2024-02-03 DIAGNOSIS — M6281 Muscle weakness (generalized): Secondary | ICD-10-CM | POA: Diagnosis not present

## 2024-02-08 ENCOUNTER — Encounter

## 2024-02-09 DIAGNOSIS — R208 Other disturbances of skin sensation: Secondary | ICD-10-CM | POA: Diagnosis not present

## 2024-02-09 DIAGNOSIS — L918 Other hypertrophic disorders of the skin: Secondary | ICD-10-CM | POA: Diagnosis not present

## 2024-02-09 DIAGNOSIS — Z85828 Personal history of other malignant neoplasm of skin: Secondary | ICD-10-CM | POA: Diagnosis not present

## 2024-02-09 DIAGNOSIS — D225 Melanocytic nevi of trunk: Secondary | ICD-10-CM | POA: Diagnosis not present

## 2024-02-09 DIAGNOSIS — L565 Disseminated superficial actinic porokeratosis (DSAP): Secondary | ICD-10-CM | POA: Diagnosis not present

## 2024-02-09 DIAGNOSIS — L720 Epidermal cyst: Secondary | ICD-10-CM | POA: Diagnosis not present

## 2024-02-09 DIAGNOSIS — D2272 Melanocytic nevi of left lower limb, including hip: Secondary | ICD-10-CM | POA: Diagnosis not present

## 2024-02-09 DIAGNOSIS — D2262 Melanocytic nevi of left upper limb, including shoulder: Secondary | ICD-10-CM | POA: Diagnosis not present

## 2024-02-09 DIAGNOSIS — L570 Actinic keratosis: Secondary | ICD-10-CM | POA: Diagnosis not present

## 2024-02-09 DIAGNOSIS — D2261 Melanocytic nevi of right upper limb, including shoulder: Secondary | ICD-10-CM | POA: Diagnosis not present

## 2024-02-09 DIAGNOSIS — Z85820 Personal history of malignant melanoma of skin: Secondary | ICD-10-CM | POA: Diagnosis not present

## 2024-02-14 DIAGNOSIS — M25511 Pain in right shoulder: Secondary | ICD-10-CM | POA: Diagnosis not present

## 2024-02-14 DIAGNOSIS — M6281 Muscle weakness (generalized): Secondary | ICD-10-CM | POA: Diagnosis not present

## 2024-02-14 DIAGNOSIS — G8929 Other chronic pain: Secondary | ICD-10-CM | POA: Diagnosis not present

## 2024-02-14 DIAGNOSIS — M25611 Stiffness of right shoulder, not elsewhere classified: Secondary | ICD-10-CM | POA: Diagnosis not present

## 2024-02-15 ENCOUNTER — Encounter

## 2024-02-17 ENCOUNTER — Encounter

## 2024-02-17 ENCOUNTER — Telehealth: Payer: Self-pay | Admitting: Internal Medicine

## 2024-02-17 ENCOUNTER — Other Ambulatory Visit: Payer: Self-pay

## 2024-02-17 DIAGNOSIS — I480 Paroxysmal atrial fibrillation: Secondary | ICD-10-CM

## 2024-02-17 MED ORDER — APIXABAN 2.5 MG PO TABS: 2.5000 mg | ORAL_TABLET | Freq: Two times a day (BID) | ORAL | 5 refills | Status: AC

## 2024-02-17 NOTE — Telephone Encounter (Signed)
 Prescription refill request for Eliquis  received. Indication:AFIB Last office visit:9/25 Scr: 0.99  8/25 Age:89 Weight:54.7  kg  Prescription refilled

## 2024-02-18 ENCOUNTER — Encounter: Payer: Self-pay | Admitting: Pharmacist

## 2024-02-18 NOTE — Progress Notes (Signed)
 Pharmacy Quality Measure Review  This patient is appearing on a report for being at risk of failing the adherence measure for hypertension (ACEi/ARB) medications this calendar year.   Medication: benazepril  40 mg Last fill date: 09/18/23 for 90 day supply  Insurance report was not up to date. No action needed at this time.  Medication has been refilled as of 11/3 x90 ds. (Also refilled rosuvastatin , hydrochlorothiazide )   Upcoming Dec visit with PCP: No action needed Closed measures: SPC, CBP

## 2024-02-22 ENCOUNTER — Encounter

## 2024-02-24 ENCOUNTER — Encounter

## 2024-02-25 ENCOUNTER — Other Ambulatory Visit

## 2024-02-25 DIAGNOSIS — R739 Hyperglycemia, unspecified: Secondary | ICD-10-CM | POA: Diagnosis not present

## 2024-02-25 DIAGNOSIS — I10 Essential (primary) hypertension: Secondary | ICD-10-CM | POA: Diagnosis not present

## 2024-02-25 DIAGNOSIS — E785 Hyperlipidemia, unspecified: Secondary | ICD-10-CM | POA: Diagnosis not present

## 2024-02-25 LAB — BASIC METABOLIC PANEL WITH GFR
BUN: 16 mg/dL (ref 6–23)
CO2: 30 meq/L (ref 19–32)
Calcium: 10.1 mg/dL (ref 8.4–10.5)
Chloride: 100 meq/L (ref 96–112)
Creatinine, Ser: 0.92 mg/dL (ref 0.40–1.20)
GFR: 54.27 mL/min — ABNORMAL LOW (ref >60.00)
Glucose, Bld: 90 mg/dL (ref 70–99)
Potassium: 4.5 meq/L (ref 3.5–5.1)
Sodium: 139 meq/L (ref 135–145)

## 2024-02-25 LAB — HEPATIC FUNCTION PANEL
ALT: 16 U/L (ref 0–35)
AST: 18 U/L (ref 0–37)
Albumin: 4.4 g/dL (ref 3.5–5.2)
Alkaline Phosphatase: 63 U/L (ref 39–117)
Bilirubin, Direct: 0.2 mg/dL (ref 0.0–0.3)
Total Bilirubin: 0.7 mg/dL (ref 0.2–1.2)
Total Protein: 6.4 g/dL (ref 6.0–8.3)

## 2024-02-25 LAB — HEMOGLOBIN A1C: Hgb A1c MFr Bld: 5.5 % (ref 4.6–6.5)

## 2024-02-25 LAB — LIPID PANEL
Cholesterol: 172 mg/dL (ref 0–200)
HDL: 95.7 mg/dL (ref >39.00)
LDL Cholesterol: 64 mg/dL (ref 0–99)
NonHDL: 76.59
Total CHOL/HDL Ratio: 2
Triglycerides: 61 mg/dL (ref 0.0–149.0)
VLDL: 12.2 mg/dL (ref 0.0–40.0)

## 2024-02-29 ENCOUNTER — Encounter

## 2024-03-01 ENCOUNTER — Ambulatory Visit: Admitting: Internal Medicine

## 2024-03-01 ENCOUNTER — Encounter: Payer: Self-pay | Admitting: Internal Medicine

## 2024-03-01 VITALS — BP 120/64 | HR 65 | Temp 97.5°F | Ht 59.0 in | Wt 117.6 lb

## 2024-03-01 DIAGNOSIS — R739 Hyperglycemia, unspecified: Secondary | ICD-10-CM | POA: Diagnosis not present

## 2024-03-01 DIAGNOSIS — F439 Reaction to severe stress, unspecified: Secondary | ICD-10-CM | POA: Diagnosis not present

## 2024-03-01 DIAGNOSIS — Z853 Personal history of malignant neoplasm of breast: Secondary | ICD-10-CM | POA: Diagnosis not present

## 2024-03-01 DIAGNOSIS — E785 Hyperlipidemia, unspecified: Secondary | ICD-10-CM | POA: Diagnosis not present

## 2024-03-01 DIAGNOSIS — Z860100 Personal history of colon polyps, unspecified: Secondary | ICD-10-CM | POA: Diagnosis not present

## 2024-03-01 DIAGNOSIS — I10 Essential (primary) hypertension: Secondary | ICD-10-CM

## 2024-03-01 DIAGNOSIS — E782 Mixed hyperlipidemia: Secondary | ICD-10-CM | POA: Diagnosis not present

## 2024-03-01 DIAGNOSIS — I4819 Other persistent atrial fibrillation: Secondary | ICD-10-CM

## 2024-03-01 DIAGNOSIS — Z1231 Encounter for screening mammogram for malignant neoplasm of breast: Secondary | ICD-10-CM | POA: Diagnosis not present

## 2024-03-01 DIAGNOSIS — I2510 Atherosclerotic heart disease of native coronary artery without angina pectoris: Secondary | ICD-10-CM | POA: Diagnosis not present

## 2024-03-01 NOTE — Progress Notes (Signed)
 "  Subjective:    Patient ID: Erin [name], female    DOB: 05/21/1930, 89 y.o.   MRN: [id number]  Patient here for  Chief Complaint  Patient presents with   Medical Management of Chronic Issues    HPI Here for a scheduled follow up - follow up regarding hypertension, hypercholesterolemia, afib and hyperglycemia. Evaluated in ED 09/14/23 - chest pain. Also reported some DOE and leg swelling. Ultrasound - negative for DVT. CTA - negative for PE or other acute finding. Back pain improved with lidoderm  patch. Prior to ED visit,saw cardiology with increased fatigue and exertional dyspnea and leg swelling. CBC and TSH wnl. CXR ok. ECHO - EF 60-65% with aortic valve sclerosis. Saw cardiology 10/2023 - f/u fatigue and intermittent chest pain. Is s/p myocardial PET/CT - low risk without evidence of ischemia or scar. LVEF and myocardial blood flow reserve were both normal. Had cardiology f/u 12/02/23 - chest pain and shoulder pain - resolved. Recommended to continue eliquis , rosuvastatin  and amiodarone . Has been going to PT- right shoulder pain. Reviewed outside blood pressures - most averaging 120s/50-60s.    Past Medical History:  Diagnosis Date   A-fib [facility])    Breast cancer (HCC)    Cancer (HCC)    melanoma - arm left   Chest pain 07/13/2016   History of colon polyps    Hypercholesterolemia    Hypertension    Personal history of radiation therapy    Past Surgical History:  Procedure Laterality Date   APPENDECTOMY  1946   BREAST BIOPSY Left 06/04/2020   us  bx, vision marker, positive   BREAST LUMPECTOMY Left 06/24/2020   BREAST LUMPECTOMY WITH SENTINEL LYMPH NODE BIOPSY Left 06/24/2020   Procedure: BREAST LUMPECTOMY WITH SENTINEL LYMPH NODE BIOPSY;  Surgeon: Dessa Reyes ORN, MD;  Location: [facility] ORS;  Service: General;  Laterality: Left;   CARDIOVERSION N/A 07/30/2020   Procedure: CARDIOVERSION;  Surgeon: Mady Bruckner, MD;  Location: [facility] ORS;  Service: Cardiovascular;  Laterality: N/A;    CARDIOVERSION N/A 09/03/2020   Procedure: CARDIOVERSION;  Surgeon: Mady Bruckner, MD;  Location: [facility] ORS;  Service: Cardiovascular;  Laterality: N/A;   CATARACT EXTRACTION Bilateral    DILATION AND CURETTAGE OF UTERUS     LAMINECTOMY  2000   release of foraminal stenosis  2003   Family History  Problem Relation Age of Onset   Hypertension Mother    Hypertension Sister    Multiple sclerosis Sister        died 73   Heart disease Brother        S/P CABG   Breast cancer Maternal Aunt    Social History   Socioeconomic History   Marital status: Widowed    Spouse name: Not on file   Number of children: 4   Years of education: Not on file   Highest education level: Not on file  Occupational History   Occupation: retired  Tobacco Use   Smoking status: Former   Smokeless tobacco: Never  Vaping Use   Vaping status: Never Used  Substance and Sexual Activity   Alcohol use: Yes    Alcohol/week: 0.0 standard drinks of alcohol    Comment: occassionally   Drug use: No   Sexual activity: Never  Other Topics Concern   Not on file  Social History Narrative   Not on file   Social Drivers of Health   Tobacco Use: Medium Risk (03/10/2024)   Patient History    Smoking Tobacco Use: Former  Smokeless Tobacco Use: Never    Passive Exposure: Not on file  Financial Resource Strain: Low Risk  (01/03/2024)   Received from [facility]   Overall Financial Resource Strain (CARDIA)    Difficulty of Paying Living Expenses: Not hard at all  Food Insecurity: No Food Insecurity (01/03/2024)   Received from [facility]   Epic    Within the past 12 months, you worried that your food would run out before you got the money to buy more.: Never true    Within the past 12 months, the food you bought just didn't last and you didn't have money to get more.: Never true  Transportation Needs: No Transportation Needs (01/03/2024)   Received from [facility] - Transportation    In the past 12 months, has lack of transportation kept you from medical appointments or from getting medications?: No    Lack of Transportation (Non-Medical): No  Physical Activity: Sufficiently Active (05/11/2023)   Exercise Vital Sign    Days of Exercise per Week: 6 days    Minutes of Exercise per Session: 40 min  Stress: No Stress Concern Present (05/11/2023)   [employer] of Occupational Health - Occupational Stress Questionnaire    Feeling of Stress : Not at all  Social Connections: Moderately Integrated (05/11/2023)   Social Connection and Isolation Panel    Frequency of Communication with Friends and Family: More than three times a week    Frequency of Social Gatherings with Friends and Family: More than three times a week    Attends Religious Services: More than 4 times per year    Active Member of [employer] or Organizations: Yes    Attends Banker Meetings: More than 4 times per year    Marital Status: Widowed  Depression (PHQ2-9): Low Risk (03/01/2024)   Depression (PHQ2-9)    PHQ-2 Score: 2  Alcohol Screen: Low Risk (05/11/2023)   Alcohol Screen    Last Alcohol Screening Score (AUDIT): 3  Housing: Unknown (01/13/2024)   Received from [facility]   Epic    In the last 12 months, was there a time when you were not able to pay the mortgage or rent on time?: No    Number of Times Moved in the Last Year: Not on file    At any time in the past 12 months, were you homeless or living in a shelter (including now)?: No  Utilities: Not At Risk (01/03/2024)   Received from [facility]   Epic    In the past 12 months has the electric, gas, oil, or water company threatened to shut off services in your home?: No  Health Literacy: Adequate Health Literacy (05/11/2023)   B1300 Health Literacy    Frequency of need for help with medical instructions: Never     Review of Systems  Constitutional:   Negative for appetite change and unexpected weight change.  HENT:  Negative for congestion and sinus pressure.   Respiratory:  Negative for cough, chest tightness and shortness of breath.   Cardiovascular:  Negative for chest pain, palpitations and leg swelling.  Gastrointestinal:  Negative for abdominal pain, diarrhea, nausea and vomiting.  Genitourinary:  Negative for difficulty urinating and dysuria.  Musculoskeletal:  Negative for joint swelling and myalgias.  Skin:  Negative for color change and rash.  Neurological:  Negative for dizziness and headaches.  Psychiatric/Behavioral:  Negative for agitation and dysphoric mood.  Objective:     BP 120/64   Pulse 65   Temp (!) 97.5 F (36.4 C) (Oral)   Ht 4' 11 (1.499 m)   Wt 117 lb 9.6 oz (53.3 kg)   SpO2 98%   BMI 23.75 kg/m  Wt Readings from Last 3 Encounters:  03/01/24 117 lb 9.6 oz (53.3 kg)  12/20/23 120 lb 9.6 oz (54.7 kg)  12/02/23 122 lb 6 oz (55.5 kg)    Physical Exam Vitals reviewed.  Constitutional:      General: She is not in acute distress.    Appearance: Normal appearance.  HENT:     Head: Normocephalic and atraumatic.     Right Ear: External ear normal.     Left Ear: External ear normal.     Mouth/Throat:     Pharynx: No oropharyngeal exudate or posterior oropharyngeal erythema.  Eyes:     General: No scleral icterus.       Right eye: No discharge.        Left eye: No discharge.     Conjunctiva/sclera: Conjunctivae normal.  Neck:     Thyroid : No thyromegaly.  Cardiovascular:     Rate and Rhythm: Normal rate and regular rhythm.  Pulmonary:     Effort: No respiratory distress.     Breath sounds: Normal breath sounds. No wheezing.  Abdominal:     General: Bowel sounds are normal.     Palpations: Abdomen is soft.     Tenderness: There is no abdominal tenderness.  Musculoskeletal:        General: No swelling or tenderness.     Cervical back: Neck supple. No tenderness.  Lymphadenopathy:      Cervical: No cervical adenopathy.  Skin:    Findings: No erythema or rash.  Neurological:     Mental Status: She is alert.  Psychiatric:        Mood and Affect: Mood normal.        Behavior: Behavior normal.         Outpatient Encounter Medications as of 03/01/2024  Medication Sig   acetaminophen  (TYLENOL ) 325 MG tablet Take 650 mg by mouth every 6 (six) hours as needed for moderate pain or headache.   amiodarone  (PACERONE ) 200 MG tablet Take 0.5 tablets (100 mg total) by mouth daily.   apixaban  (ELIQUIS ) 2.5 MG TABS tablet Take 1 tablet (2.5 mg total) by mouth 2 (two) times daily.   benazepril  (LOTENSIN ) 40 MG tablet Take 1 tablet (40 mg total) by mouth daily.   Calcium  Carbonate-Vitamin D 600-400 MG-UNIT tablet Take 1 tablet by mouth in the morning.   felodipine  (PLENDIL ) 2.5 MG 24 hr tablet Take 1 tablet (2.5 mg total) by mouth every evening.   hydrochlorothiazide  (MICROZIDE ) 12.5 MG capsule Take 1 capsule (12.5 mg total) by mouth every morning.   Multiple Vitamin (MULTIVITAMIN WITH MINERALS) TABS tablet Take 1 tablet by mouth in the morning.   rosuvastatin  (CRESTOR ) 5 MG tablet Take 1 tablet (5 mg total) by mouth daily.   [DISCONTINUED] furosemide  (LASIX ) 20 MG tablet Take 1 tablet (20 mg total) by mouth daily as needed. (Patient not taking: Reported on 03/01/2024)   No facility-administered encounter medications on file as of 03/01/2024.     Lab Results  Component Value Date   WBC 5.5 09/14/2023   HGB 14.1 09/14/2023   HCT 42.0 09/14/2023   PLT 213 09/14/2023   GLUCOSE 90 02/25/2024   CHOL 172 02/25/2024   TRIG 61.0 02/25/2024   HDL  95.70 02/25/2024   LDLCALC 64 02/25/2024   ALT 16 02/25/2024   AST 18 02/25/2024   NA 139 02/25/2024   K 4.5 02/25/2024   CL 100 02/25/2024   CREATININE 0.92 02/25/2024   BUN 16 02/25/2024   CO2 30 02/25/2024   TSH 0.735 09/10/2023   HGBA1C 5.5 02/25/2024    NM PET CT CARDIAC PERFUSION MULTI W/ABSOLUTE BLOODFLOW Result Date:  11/12/2023   LV perfusion is normal. There is no evidence of ischemia. There is no evidence of infarction.   Rest left ventricular function is normal. Rest EF: 73%. Stress left ventricular function is normal. Stress EF: 74%. End diastolic cavity size is normal. End systolic cavity size is normal.   Myocardial blood flow was computed to be 0.44ml/g/min at rest and 1.62ml/g/min at stress. Global myocardial blood flow reserve was 2.31 and was normal.   Coronary calcium  was present on the attenuation correction CT images. Moderate coronary calcifications were present. Coronary calcifications were present in the left anterior descending artery, left circumflex artery and right coronary artery distribution(s).   The study is normal. The study is low risk. CLINICAL DATA:  This over-read does not include interpretation of cardiac or coronary anatomy or pathology. The Cardiac PET interpretation by the cardiologist is attached. COMPARISON:  None Available. FINDINGS: Limited view of the lung parenchyma demonstrates no suspicious nodularity. Airways are normal. Limited view of the mediastinum demonstrates no adenopathy. Esophagus normal. Limited view of the upper abdomen unremarkable. Limited view of the skeleton and chest wall is unremarkable. IMPRESSION: No significant extracardiac findings. Electronically Signed   By: Jackquline Boxer M.D.   On: 11/11/2023 16:56      Assessment & Plan:  Encounter for screening mammogram for malignant neoplasm of breast -     3D Screening Mammogram, Left and Right; Future  Hyperglycemia Assessment & Plan: Low carb diet and exercise. Follow met b and A1c.   Orders: -     Hemoglobin A1c; Future  Hyperlipidemia, unspecified hyperlipidemia type -     Lipid panel; Future -     Hepatic function panel; Future  Essential hypertension Assessment & Plan: Continue benazepril .  Also on felodipine . Follow pressures.  Follow metabolic panel. Reviewed outside blood pressures - most  averaging 120s/50-60s. No change in medication today.   Orders: -     Basic metabolic panel with GFR; Future  Stress Assessment & Plan: Overall appears to be doing well. Follow.    Mixed hyperlipidemia Assessment & Plan: Continue crestor . Follow lipid panel.    History of colonic polyps Assessment & Plan: Colonoscopy 2012 - six polyps.  GI felt no further colonoscopy warranted.     History of breast cancer Assessment & Plan: Mammogram 06/03/23 - Birads I.    Coronary artery disease involving native coronary artery of native heart without angina pectoris Assessment & Plan:  Saw cardiology 10/2023 - f/u fatigue and intermittent chest pain. Is s/p myocardial PET/CT - low risk without evidence of ischemia or scar. LVEF and myocardial blood flow reserve were both normal. Had cardiology f/u 12/02/23 - chest pain and shoulder pain - resolved. Recommended to continue eliquis , rosuvastatin  and amiodarone . No change in medication today.    Persistent atrial fibrillation [facility]) Assessment & Plan: S/p cardioversion.  Appears to be in SR today.  Continues on eliquis  and amiodarone .  Followed by cardiology. Stable.       Allena Hamilton, MD "

## 2024-03-02 ENCOUNTER — Encounter

## 2024-03-10 ENCOUNTER — Encounter: Payer: Self-pay | Admitting: Internal Medicine

## 2024-03-10 NOTE — Assessment & Plan Note (Signed)
Colonoscopy 2012 - six polyps.  GI felt no further colonoscopy warranted.

## 2024-03-10 NOTE — Assessment & Plan Note (Signed)
 Continue benazepril .  Also on felodipine . Follow pressures.  Follow metabolic panel. Reviewed outside blood pressures - most averaging 120s/50-60s. No change in medication today.

## 2024-03-10 NOTE — Assessment & Plan Note (Signed)
 Overall appears to be doing well.  Follow.

## 2024-03-10 NOTE — Assessment & Plan Note (Signed)
 Continue crestor . Follow lipid panel.

## 2024-03-10 NOTE — Assessment & Plan Note (Signed)
 Low-carb diet and exercise.  Follow met b and A1c.

## 2024-03-10 NOTE — Assessment & Plan Note (Signed)
 Mammogram 06/03/23 - Birads I.

## 2024-03-10 NOTE — Assessment & Plan Note (Signed)
 S/p cardioversion.  Appears to be in SR today.  Continues on eliquis  and amiodarone .  Followed by cardiology. Stable.

## 2024-03-10 NOTE — Assessment & Plan Note (Signed)
"   Saw cardiology 10/2023 - f/u fatigue and intermittent chest pain. Is s/p myocardial PET/CT - low risk without evidence of ischemia or scar. LVEF and myocardial blood flow reserve were both normal. Had cardiology f/u 12/02/23 - chest pain and shoulder pain - resolved. Recommended to continue eliquis , rosuvastatin  and amiodarone . No change in medication today.  "

## 2024-03-30 ENCOUNTER — Telehealth: Payer: Self-pay | Admitting: Internal Medicine

## 2024-03-30 ENCOUNTER — Ambulatory Visit: Admission: EM | Admit: 2024-03-30 | Discharge: 2024-03-30 | Disposition: A | Discharge status: Home / Self Care

## 2024-03-30 NOTE — Telephone Encounter (Signed)
 Copied from CRM [id number]. Topic: General - Call Back - No Documentation >> Mar 30, 2024 10:07 AM Erin [name] wrote: Reason for CRM: Pt wanted nurse to CB Pt has had a cough for a week may need to be checked. And wanted provider to know things pt found out about and will let provider know. Cb [phone]

## 2024-03-31 ENCOUNTER — Ambulatory Visit
Admission: EM | Admit: 2024-03-31 | Discharge: 2024-03-31 | Disposition: A | Discharge status: Home / Self Care | Attending: Emergency Medicine | Admitting: Emergency Medicine

## 2024-03-31 VITALS — BP 154/63 | HR 85 | Temp 97.7°F | Resp 19

## 2024-03-31 DIAGNOSIS — R051 Acute cough: Secondary | ICD-10-CM | POA: Diagnosis not present

## 2024-03-31 MED ORDER — AMOXICILLIN 500 MG PO CAPS: 500.0000 mg | ORAL_CAPSULE | Freq: Two times a day (BID) | ORAL | 0 refills | Status: AC

## 2024-03-31 NOTE — ED Provider Notes (Signed)
 " [facility]    CSN: [id number] Arrival date & time: 03/31/24  1050      History   Chief Complaint Chief Complaint  Patient presents with   Cough    HPI Erin [name] is a 89 y.o. female.   Patient presents for evaluation of a nonproductive cough present for 10 days.  Tolerable to food and liquids.  No known sick contact prior has not attempted treatment.  Denies shortness of breath, wheezing, fever.   Past Medical History:  Diagnosis Date   A-fib [facility])    Breast cancer (HCC)    Cancer (HCC)    melanoma - arm left   Chest pain 07/13/2016   History of colon polyps    Hypercholesterolemia    Hypertension    Personal history of radiation therapy     Patient Active Problem List   Diagnosis Date Noted   DNR (do not resuscitate) 12/26/2023   CAD (coronary artery disease) 12/02/2023   Toe pain, right 10/17/2023   Shoulder pain 10/14/2023   Dyspnea on exertion 09/11/2023   Aortic atherosclerosis 09/11/2023   Labile hypertension 09/11/2023   Hypercoagulable state due to persistent atrial fibrillation (HCC) 02/25/2023   Decreased GFR 07/30/2021   History of breast cancer 05/04/2021   Osteopenia 05/04/2021   Encounter for completion of form with patient 07/10/2020   Carcinoma of upper-outer quadrant of left breast in female, estrogen receptor positive (HCC) 07/10/2020   Carcinoma in situ of breast 06/09/2020   Atrial fibrillation (HCC) 06/07/2020   Breast nodule 05/16/2020   PVC's (premature ventricular contractions) 06/16/2017   Narrow complex tachycardia 03/28/2017   Atrial flutter (HCC) 11/24/2016   PAC (premature atrial contraction) 10/07/2016   Atypical chest pain 06/18/2016   Left arm numbness 06/02/2015   Health care maintenance 10/27/2014   History of melanoma 10/27/2014   Stress 06/24/2014   Leukopenia 02/25/2014   Abnormal mammogram 11/11/2013   Left knee pain 03/16/2013   Hyperglycemia 05/31/2012   History of colonic polyps 05/31/2012    Essential hypertension 12/28/2011   Mixed hyperlipidemia 12/28/2011    Past Surgical History:  Procedure Laterality Date   APPENDECTOMY  1946   BREAST BIOPSY Left 06/04/2020   us  bx, vision marker, positive   BREAST LUMPECTOMY Left 06/24/2020   BREAST LUMPECTOMY WITH SENTINEL LYMPH NODE BIOPSY Left 06/24/2020   Procedure: BREAST LUMPECTOMY WITH SENTINEL LYMPH NODE BIOPSY;  Surgeon: Dessa Reyes ORN, MD;  Location: [facility] ORS;  Service: General;  Laterality: Left;   CARDIOVERSION N/A 07/30/2020   Procedure: CARDIOVERSION;  Surgeon: Mady Bruckner, MD;  Location: [facility] ORS;  Service: Cardiovascular;  Laterality: N/A;   CARDIOVERSION N/A 09/03/2020   Procedure: CARDIOVERSION;  Surgeon: Mady Bruckner, MD;  Location: [facility] ORS;  Service: Cardiovascular;  Laterality: N/A;   CATARACT EXTRACTION Bilateral    DILATION AND CURETTAGE OF UTERUS     LAMINECTOMY  2000   release of foraminal stenosis  2003    OB History     Gravida  4   Para  4   Term      Preterm      AB      Living         SAB      IAB      Ectopic      Multiple      Live Births               Home Medications    Prior to Admission medications  Medication Sig Start Date End Date Taking? Authorizing Provider  acetaminophen  (TYLENOL ) 325 MG tablet Take 650 mg by mouth every 6 (six) hours as needed for moderate pain or headache.    [provider]  amiodarone  (PACERONE ) 200 MG tablet Take 0.5 tablets (100 mg total) by mouth daily. 08/10/23   Loistine Sober, NP  apixaban  (ELIQUIS ) 2.5 MG TABS tablet Take 1 tablet (2.5 mg total) by mouth 2 (two) times daily. 02/17/24   End, Lonni, MD  benazepril  (LOTENSIN ) 40 MG tablet Take 1 tablet (40 mg total) by mouth daily. 10/20/23   End, Lonni, MD  Calcium  Carbonate-Vitamin D 600-400 MG-UNIT tablet Take 1 tablet by mouth in the morning.    [provider]  felodipine  (PLENDIL ) 2.5 MG 24 hr tablet Take 1 tablet (2.5 mg total) by  mouth every evening. 10/20/23   End, Lonni, MD  hydrochlorothiazide  (MICROZIDE ) 12.5 MG capsule Take 1 capsule (12.5 mg total) by mouth every morning. 10/20/23 03/01/24  End, Lonni, MD  Multiple Vitamin (MULTIVITAMIN WITH MINERALS) TABS tablet Take 1 tablet by mouth in the morning.    [provider]  rosuvastatin  (CRESTOR ) 5 MG tablet Take 1 tablet (5 mg total) by mouth daily. 10/14/23   Glendia Shad, MD    Family History Family History  Problem Relation Age of Onset   Hypertension Mother    Hypertension Sister    Multiple sclerosis Sister        died 23   Heart disease Brother        S/P CABG   Breast cancer Maternal Aunt     Social History Social History[1]   Allergies   Erythromycin and Minocin [minocycline hcl]   Review of Systems Review of Systems  Constitutional: Negative.   HENT: Negative.    Respiratory:  Positive for cough. Negative for apnea, choking, chest tightness, shortness of breath, wheezing and stridor.   Gastrointestinal: Negative.      Physical Exam Triage Vital Signs ED Triage Vitals  Encounter Vitals Group     BP 03/31/24 1133 (!) 154/63     Girls Systolic BP Percentile --      Girls Diastolic BP Percentile --      Boys Systolic BP Percentile --      Boys Diastolic BP Percentile --      Pulse Rate 03/31/24 1133 85     Resp 03/31/24 1133 19     Temp 03/31/24 1133 97.7 F (36.5 C)     Temp Source 03/31/24 1133 Oral     SpO2 03/31/24 1133 96 %     Weight --      Height --      Head Circumference --      Peak Flow --      Pain Score 03/31/24 1134 0     Pain Loc --      Pain Education --      Exclude from Growth Chart --    No data found.  Updated Vital Signs BP (!) 154/63 (BP Location: Right Arm)   Pulse 85   Temp 97.7 F (36.5 C) (Oral)   Resp 19   SpO2 96%   Visual Acuity Right Eye Distance:   Left Eye Distance:   Bilateral Distance:    Right Eye Near:   Left Eye Near:    Bilateral Near:      Physical Exam Constitutional:      Appearance: Normal appearance.  Eyes:     Extraocular Movements: Extraocular movements  intact.  Cardiovascular:     Rate and Rhythm: Normal rate and regular rhythm.     Pulses: Normal pulses.     Heart sounds: Normal heart sounds.  Pulmonary:     Effort: Pulmonary effort is normal.     Breath sounds: Normal breath sounds.  Neurological:     Mental Status: She is alert and oriented to person, place, and time.      UC Treatments / Results  Labs (all labs ordered are listed, but only abnormal results are displayed) Labs Reviewed - No data to display  EKG   Radiology No results found.  Procedures Procedures (including critical care time)  Medications Ordered in UC Medications - No data to display  Initial Impression / Assessment and Plan / UC Course  I have reviewed the triage vital signs and the nursing notes.  Pertinent labs & imaging results that were available during my care of the patient were reviewed by me and considered in my medical decision making (see chart for details).  Acute cough  Vitals are stable, patient in no sign distress nontoxic-appearing, lungs clear to auscultation, stable for outpatient management, low suspicion for pneumonia or bronchitis therefore imaging deferred, symptoms persisting for 10 days therefore prescribed amoxicillin , cough at this time is not bothersome but has not resolved deferring use of prescription cough medicine recommended over the counter Delsym to be used as needed, recommended nonpharmacological measures and advise follow-up as needed Final Clinical Impressions(s) / UC Diagnoses   Final diagnoses:  None   Discharge Instructions   None    ED Prescriptions   None    PDMP not reviewed this encounter.     [1]  Social History Tobacco Use   Smoking status: Former   Smokeless tobacco: Never  Vaping Use   Vaping status: Never Used  Substance Use Topics   Alcohol use: Yes     Alcohol/week: 0.0 standard drinks of alcohol    Comment: occassionally   Drug use: No     Teresa Shelba SAUNDERS, NP 03/31/24 1201  "

## 2024-03-31 NOTE — Telephone Encounter (Signed)
 Please call and confirm she was evaluated. No notes in chart. Wanted to confirm she is doing ok.

## 2024-03-31 NOTE — ED Triage Notes (Signed)
 Patient reports dry cough x 10 days. Patient has not taken anything for symptoms.

## 2024-03-31 NOTE — Discharge Instructions (Signed)
 Sam and your lungs are clear and you are getting enough air without any assistance I have a very low suspicion that you would have pneumonia however as your cough has been present for 10 days she will be placed on antibiotic as typically bacteria will cause symptoms to linger  Take amoxicillin  twice daily for 7 days  If cough becomes worrisome you may purchase over-the-counter Delsym to help calm  You can take Tylenol  and/or Ibuprofen as needed for fever reduction and pain relief.   For cough: honey 1/2 to 1 teaspoon (you can dilute the honey in water or another fluid).   You can use a humidifier for chest congestion and cough.  If you don't have a humidifier, you can sit in the bathroom with the hot shower running.     It is important to stay hydrated: drink plenty of fluids (water, gatorade/powerade/pedialyte, juices, or teas) to keep your throat moisturized and help further relieve irritation/discomfort.

## 2024-05-16 ENCOUNTER — Ambulatory Visit: Payer: PPO

## 2024-05-30 ENCOUNTER — Ambulatory Visit: Admitting: Internal Medicine

## 2024-06-01 ENCOUNTER — Ambulatory Visit: Admitting: Internal Medicine

## 2024-06-30 ENCOUNTER — Other Ambulatory Visit

## 2024-07-05 ENCOUNTER — Ambulatory Visit: Admitting: Internal Medicine
